# Patient Record
Sex: Male | Born: 1945 | Race: White | Hispanic: No | Marital: Married | Smoking: Former smoker
Health system: Southern US, Community
[De-identification: ages and names within clinical notes are randomized; demographics above are authoritative.]

## PROBLEM LIST (undated history)

## (undated) DIAGNOSIS — G309 Alzheimer's disease, unspecified: Secondary | ICD-10-CM

## (undated) DIAGNOSIS — Z923 Personal history of irradiation: Secondary | ICD-10-CM

## (undated) DIAGNOSIS — R12 Heartburn: Secondary | ICD-10-CM

## (undated) DIAGNOSIS — G4730 Sleep apnea, unspecified: Secondary | ICD-10-CM

## (undated) DIAGNOSIS — C61 Malignant neoplasm of prostate: Secondary | ICD-10-CM

## (undated) DIAGNOSIS — E785 Hyperlipidemia, unspecified: Secondary | ICD-10-CM

## (undated) DIAGNOSIS — M1990 Unspecified osteoarthritis, unspecified site: Secondary | ICD-10-CM

## (undated) DIAGNOSIS — Z9221 Personal history of antineoplastic chemotherapy: Secondary | ICD-10-CM

## (undated) DIAGNOSIS — F329 Major depressive disorder, single episode, unspecified: Secondary | ICD-10-CM

## (undated) DIAGNOSIS — C801 Malignant (primary) neoplasm, unspecified: Secondary | ICD-10-CM

## (undated) DIAGNOSIS — F0280 Dementia in other diseases classified elsewhere without behavioral disturbance: Secondary | ICD-10-CM

## (undated) DIAGNOSIS — F0390 Unspecified dementia without behavioral disturbance: Secondary | ICD-10-CM

## (undated) DIAGNOSIS — F32A Depression, unspecified: Secondary | ICD-10-CM

## (undated) DIAGNOSIS — I10 Essential (primary) hypertension: Secondary | ICD-10-CM

## (undated) DIAGNOSIS — E291 Testicular hypofunction: Secondary | ICD-10-CM

## (undated) DIAGNOSIS — T7840XA Allergy, unspecified, initial encounter: Secondary | ICD-10-CM

## (undated) HISTORY — DX: Essential (primary) hypertension: I10

## (undated) HISTORY — DX: Sleep apnea, unspecified: G47.30

## (undated) HISTORY — PX: INGUINAL HERNIA REPAIR: SUR1180

## (undated) HISTORY — DX: Hyperlipidemia, unspecified: E78.5

## (undated) HISTORY — PX: APPENDECTOMY: SHX54

## (undated) HISTORY — DX: Malignant neoplasm of prostate: C61

## (undated) HISTORY — DX: Allergy, unspecified, initial encounter: T78.40XA

## (undated) HISTORY — DX: Depression, unspecified: F32.A

## (undated) HISTORY — DX: Major depressive disorder, single episode, unspecified: F32.9

## (undated) HISTORY — DX: Malignant (primary) neoplasm, unspecified: C80.1

## (undated) HISTORY — DX: Testicular hypofunction: E29.1

## (undated) HISTORY — DX: Personal history of antineoplastic chemotherapy: Z92.21

## (undated) HISTORY — PX: MENISECTOMY: SHX5181

## (undated) HISTORY — DX: Unspecified osteoarthritis, unspecified site: M19.90

## (undated) HISTORY — PX: PROSTATE SURGERY: SHX751

---

## 2000-03-02 ENCOUNTER — Encounter: Payer: Self-pay | Admitting: Cardiology

## 2000-03-02 ENCOUNTER — Encounter: Admission: RE | Admit: 2000-03-02 | Discharge: 2000-03-02 | Payer: Self-pay | Admitting: Cardiology

## 2000-03-07 ENCOUNTER — Observation Stay (HOSPITAL_COMMUNITY): Admission: RE | Admit: 2000-03-07 | Discharge: 2000-03-07 | Payer: Self-pay | Admitting: Cardiology

## 2002-08-20 ENCOUNTER — Encounter: Payer: Self-pay | Admitting: Family Medicine

## 2002-08-20 ENCOUNTER — Encounter: Admission: RE | Admit: 2002-08-20 | Discharge: 2002-08-20 | Payer: Self-pay | Admitting: Family Medicine

## 2002-08-25 ENCOUNTER — Encounter: Admission: RE | Admit: 2002-08-25 | Discharge: 2002-08-25 | Payer: Self-pay | Admitting: Family Medicine

## 2002-08-25 ENCOUNTER — Encounter: Payer: Self-pay | Admitting: Family Medicine

## 2002-08-29 ENCOUNTER — Encounter: Payer: Self-pay | Admitting: Family Medicine

## 2002-08-29 ENCOUNTER — Encounter: Admission: RE | Admit: 2002-08-29 | Discharge: 2002-08-29 | Payer: Self-pay | Admitting: Family Medicine

## 2003-05-24 ENCOUNTER — Encounter: Payer: Self-pay | Admitting: Family Medicine

## 2003-05-24 ENCOUNTER — Encounter: Admission: RE | Admit: 2003-05-24 | Discharge: 2003-05-24 | Payer: Self-pay | Admitting: Family Medicine

## 2005-08-07 HISTORY — PX: COLONOSCOPY: SHX174

## 2006-03-31 ENCOUNTER — Emergency Department (HOSPITAL_COMMUNITY): Admission: EM | Admit: 2006-03-31 | Discharge: 2006-03-31 | Payer: Self-pay | Admitting: Family Medicine

## 2006-06-27 ENCOUNTER — Ambulatory Visit: Payer: Self-pay | Admitting: Family Medicine

## 2006-06-28 ENCOUNTER — Observation Stay (HOSPITAL_COMMUNITY): Admission: EM | Admit: 2006-06-28 | Discharge: 2006-06-29 | Payer: Self-pay | Admitting: Emergency Medicine

## 2006-07-12 ENCOUNTER — Ambulatory Visit: Payer: Self-pay | Admitting: Family Medicine

## 2006-08-03 ENCOUNTER — Ambulatory Visit: Payer: Self-pay | Admitting: Internal Medicine

## 2006-08-17 ENCOUNTER — Ambulatory Visit: Payer: Self-pay | Admitting: Internal Medicine

## 2007-01-28 ENCOUNTER — Ambulatory Visit: Payer: Self-pay | Admitting: Family Medicine

## 2007-02-05 ENCOUNTER — Ambulatory Visit (HOSPITAL_COMMUNITY): Admission: RE | Admit: 2007-02-05 | Discharge: 2007-02-06 | Payer: Self-pay | Admitting: Orthopedic Surgery

## 2007-10-23 ENCOUNTER — Ambulatory Visit: Payer: Self-pay | Admitting: Family Medicine

## 2008-01-16 ENCOUNTER — Ambulatory Visit: Payer: Self-pay | Admitting: Family Medicine

## 2008-01-27 ENCOUNTER — Ambulatory Visit: Payer: Self-pay | Admitting: Family Medicine

## 2008-11-02 ENCOUNTER — Ambulatory Visit: Payer: Self-pay | Admitting: Family Medicine

## 2008-11-04 ENCOUNTER — Encounter: Admission: RE | Admit: 2008-11-04 | Discharge: 2008-11-04 | Payer: Self-pay | Admitting: Family Medicine

## 2008-11-13 ENCOUNTER — Ambulatory Visit: Payer: Self-pay | Admitting: Family Medicine

## 2008-12-08 ENCOUNTER — Ambulatory Visit: Payer: Self-pay | Admitting: Family Medicine

## 2009-07-13 ENCOUNTER — Ambulatory Visit: Payer: Self-pay | Admitting: Family Medicine

## 2009-09-13 ENCOUNTER — Ambulatory Visit: Payer: Self-pay | Admitting: Family Medicine

## 2009-12-09 ENCOUNTER — Ambulatory Visit: Payer: Self-pay | Admitting: Family Medicine

## 2009-12-21 ENCOUNTER — Ambulatory Visit: Payer: Self-pay | Admitting: Family Medicine

## 2010-03-23 ENCOUNTER — Ambulatory Visit: Payer: Self-pay | Admitting: Family Medicine

## 2010-06-02 ENCOUNTER — Ambulatory Visit: Payer: Self-pay | Admitting: Family Medicine

## 2010-06-09 ENCOUNTER — Ambulatory Visit: Payer: Self-pay | Admitting: Family Medicine

## 2010-11-26 ENCOUNTER — Encounter: Payer: Self-pay | Admitting: Family Medicine

## 2010-12-20 NOTE — H&P (Signed)
NAMEDEXTER, SIGNOR NO.:  [id number]   MEDICAL RECORD NO.:  [id number]          PATIENT TYPE:  INP   LOCATION:  NA                           FACILITY:  [facility]   PHYSICIAN:  Madlyn Frankel. Charlann Boxer, M.D.  DATE OF BIRTH:  Dec 08, 1945   DATE OF ADMISSION:  02/05/2007  DATE OF DISCHARGE:                              HISTORY & PHYSICAL   PROCEDURE:  Left unicondylar knee replacement.   CHIEF COMPLAINT:  Left knee pain.   HISTORY OF PRESENT ILLNESS:  This is a 65 year old male with a history  of persistent progressive knee pain predominantly over the medial  portion of his knee.  He has been refractory to all conservative  treatments.  He has been presurgically assessed by Dr. Sharlot Gowda who  has recommended he go for a full cardiac workup preoperatively.   PAST MEDICAL HISTORY:  Significant for:  1. Osteoarthritis.  2. Depression.  3. 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  4. Hypertension.  5. Dyslipidemia.  6. Testicular cancer.   PAST SURGICAL HISTORY:  1. Appendectomy.  2. Inguinal orchiectomy.  3. Retroperitoneal lymph node dissection.  4. Arthroscopic knee surgery in 2004 and 2005.   FAMILY HISTORY:  Heart disease, cancer.   SOCIAL HISTORY:  Married.  Wife, Jamesetta So, who will be the primary  caregiver after surgery.   DRUG ALLERGIES:  No known drug allergies.   MEDICATIONS:  1. Amlodipine 5 mg one p.o. daily.  2. Vytorin 10/40 mg one p.o. daily.  3. Niaspan 1000 mg one p.o. daily.  4. Zoloft 100 mg one p.o. daily.  5. Wellbutrin 300 mg one p.o. daily.  6. Multivitamin one p.o. daily.  7. Baby aspirin one p.o. daily.  8. Cosamin DS two p.o. daily.   REVIEW OF SYSTEMS:  None, see HPI.   PHYSICAL EXAMINATION:  VITAL SIGNS:  Pulse 76, respirations 18, blood  pressure 128/86.  GENERAL:  He is awake, alert and oriented, well-developed, well-  nourished, in no acute distress.  He is very anxious about the surgery  and has many  questions.  NECK:  Supple, no carotid bruits.  CHEST:  Lungs clear to auscultation bilaterally.  BREASTS:  Deferred.  HEART:  Regular rate and rhythm without gallops, clicks, rubs or  murmurs.  ABDOMEN:  Soft, nontender, nondistended.  Bowel sounds present all four  quadrants.  GENITOURINARY:  Deferred.  EXTREMITIES:  Dorsalis pedis pulse positive left lower extremity.  He  has medial-sided tenderness of his left knee without effusion.  SKIN:  No skin breakdown.  No scars no rash.  NEUROLOGIC:  Intact distal sensibilities.   Labs, EKG, chest x-ray all pending.   IMPRESSION:  1. Left knee osteoarthritis.  2. Hypertension.  3. Dyslipidemia.  4. Coronary arterial spasm.  5. Testicular cancer.   PLAN OF ACTION:  Left unicondylar knee replacement or partial knee  replacement at [facility] February 05, 2007, by surgeon Dr. Durene Romans.  Risks, complications were discussed.  Questions were encouraged,  answered and reviewed.  Postoperative medications were given.  Also,  preoperative Celebrex prescription given,  indicating one by mouth twice  a day 2 days before surgery, as well as two by mouth with just sip of  water on day of surgery.  He will also continue the use of this  postoperatively with one twice per day for 2 days afterwards.     ______________________________  Yetta Glassman [address], [place]      Madlyn Frankel. Charlann Boxer, M.D.  Electronically Signed    BLM/MEDQ  D:  01/25/2007  T:  01/25/2007  Job:  [id number]

## 2010-12-20 NOTE — Op Note (Signed)
NAMEJAHMAL, [name] NO.:  [id number]   MEDICAL RECORD NO.:  [id number]          PATIENT TYPE:  INP   LOCATION:  X004                         FACILITY:  [facility]   PHYSICIAN:  Madlyn Frankel. Charlann Boxer, M.D.  DATE OF BIRTH:  12-18-1945   DATE OF PROCEDURE:  02/05/2007  DATE OF DISCHARGE:                               OPERATIVE REPORT   PREOPERATIVE DIAGNOSIS:  Left knee osteoarthritis, primarily medial  compartment.   POSTOPERATIVE DIAGNOSIS:  Left knee osteoarthritis, primarily medial  compartment.   PROCEDURE:  Left partial knee replacement, Biomed Oxford Knee with a  size small femur, a left A size tibia, and a size 4 insert left medial.   SURGEON:  Dr. Charlann Boxer   ASSISTANT:  Dwyane Luo, PA-C   ANESTHESIA:  General plus a regional femoral block.   DRAINS:  None.   COMPLICATIONS:  None.   TOURNIQUET TIME:  60 minutes at 250 mmHg.   INDICATION FOR PROCEDURE:  Mr. [name] is a 65 year old gentleman who  presented for evaluation of bilateral knee osteoarthritis primarily in  the medial compartment.  He has failed conservative management including  arthroscopic surgery, injection.  He presented to the office for  consideration of partial versus total knee replacement radiographically.  And symptom-wise, his findings were primarily medial.  We discussed  treatment options.  Risks, benefits reviewed, and he wished to proceed  with partial knee replacement.  Consent was obtained.   PROCEDURE IN DETAIL:  The patient was brought to the operative theatre.  Once adequate anesthesia and preoperative antibiotics, 2 g of Ancef,  were administered, the patient was positioned supine.  A proximal thigh  tourniquet was placed.  The leg was then positioned into the Oxford knee  holder.  I then rescrubbed and prepped the left lower extremity in  standard fashion.  The leg was exsanguinated, tourniquet elevated 250  mmHg.  Paramidline incision was made from patella to the tubercle.  Sharp  dissection was carried out to the extensor mechanism.  Soft  tissues were freed up to allow for mobility in the tissue.  Arthrotomy  was made just at the medial side of the patella.  Debridement of  synovium was carried out.  Following knee exposure, the extramedullary  guide was placed in the proximal tibia, and a proximal tibial cut was  made medially.   I checked this initially with the tibia then resected a little bit more  of the bone off the medial aspect of the medial tibial spine.  At this  point, I then checked for the feeler gauges and felt that my initial cut  looked too tight even for a size 3 feeler gauge.  For that reason, I  dropped down the cutting block to the next hole and resected the bone.  Following this, the size 3 feeler gauge felt great, and the size 4  feeler gauge just a little tight.  At this point, I removed the  extramedullary guide.  Attention was directed to the femur.  Starting  awl was placed in the center of the femoral canal just 1 cm  above the  medial aspect of the notch.   Then with the size 3 feeler gauge in place, the femoral cutting jig was  placed.  It centered into the center portion of the medial femoral  condyle.  It was at this point that it was determined that I would use a  size small femoral component based on the size of the femur.  The medial  and lateral dimension of the femur was pretty small.  We opened up a  small set and used the appropriate instruments at this point.  I drilled  this into position and made the posterior femoral cut.  All parameters  were checked based on the intramedullary rod.  Once this cut was made, I  removed bone, and the respective posterior bone fit nicely with the size  small component.  Zero spicket was in place and the distal femur milled.  I removed excessive bone and did a trial reduction.  At this point,  based on the trial reduction, I ended up using the size 4 spicket  distally.  This was because the  4 feeler gauge felt good in flexion;  however, in extension, I could not even get the 1 end barely in 20  degrees of flexion.  I then replaced with the 4 spicket mid, removed  bone including using osteotome.  With the posterior aspect of the knee  opened now, I was able to debride the remaining meniscus off the medial  proximal tibia, freed up in a posterior bone.  I then retrialed with a  small femur, nail changed to an 8 tibial tray based on the fact that  there was overhang with the size B.  With the feeler gauges in place,  the size 3 felt maybe a little loose.  The size 4 at this point felt  good but a little tight.  Nonetheless, the knee was balanced from  extension to flexion.  At this point, I finished off the tibial  preparation.  I again, using the size left A tibia, pinned into position  and used the reciprocating saw to create the trough for the keel.  Following this preparation, the keel reduction was carried out with the  standard trial.  At this point, I determined the size 4 insert felt best  in both extension and flexion.  At this point, all trial component were  removed.  I used a drill to drill through some sclerotic bone and  irrigated the knee out.  I then injected 45 mL of 0.25% Marcaine with  epinephrine and 1 mL of Toradol.  Following this, the final components  were opened and cemented in 2 batches, first the tibial tray then the  femur.  Excessive cement  debrided each of these levels and was done in  2-stage technique.  Once this was carried out and we irrigated again, I  injected Floseal on the posterior aspect of the knee and then placed the  final 4 insert medial.  It was small to match the small femoral  component.  The wound was irrigated.  Extensor mechanism reapproximated  using a #1 Vicryl.  No Hemovac used and FloSeal was utilized.  The  remaining was closed in layers with 2-0 Vicryl and 4-0 running Monocryl.  The knee was then dressed into a sterile   bulky Jones dressing .  He was  brought to the recovery room stable.      Madlyn Frankel Charlann Boxer, M.D.  Electronically Signed  MDO/MEDQ  D:  02/05/2007  T:  02/05/2007  Job:  [id number]

## 2010-12-23 NOTE — Cardiovascular Report (Signed)
[place]  Patient:    [name], [name]                         MRN: [id number] Proc. Date: 03/07/00 Attending:  Thereasa Solo. Little, M.D. CC:         Ronnald Nian, M.D.             Thereasa Solo. Little, M.D.             Cardiac Catheterization Laboratory                        Cardiac Catheterization  INDICATIONS FOR PROCEDURE:  This is a 65 year old male who has had atypical chest pain and nuclear study showed inferior ischemia and the patient is brought in for cardiac catheterization.  PROCEDURE PERFORMED: 1. Left heart catheterization. 2. Selective left and right coronary arteriography. 3. Ventriculography in the right anterior oblique projection. 4. A 6 French venous sheath for intravenous access in the right femoral    vein.  COMPLICATIONS:  None.  DESCRIPTION OF PROCEDURE:  The patient was prepped and draped in the usual sterile fashion exposing the right groin.  Following local anesthetic with 1% Xylocaine, the Seldinger technique was employed and a 6 [place] introducer sheath was placed in the right femoral artery.  Selective right and left coronary arteriography and ventriculography in the RAO projection was performed.  The 6 French Judkins configuration catheters were used.  RESULTS: 1. Hemodynamic monitoring:  Central aortic pressure 114/83, left    ventricular pressure 111/7.  There was reverse gradient noted at the    time of pullback. 2. Ventriculography:  Ventriculography in the RAO projection revealed    normal left ventricular systolic function.  There was mitral valve prolapse    without mitral regurgitation.  End-systolic pressure was 7.  Ejection    fraction greater than 60%.  When the pigtail was passed into the    left ventricle, the patient developed a transient left bundle-branch block    that was self-limiting and resolved without treatment and the patient was    asymptomatic with this and there was no drop in his blood  pressure.  CORONARY ARTERIOGRAPHY:  There was no calcification noted on fluoroscopy. 1. Left main:  Normal. 2. LAD.  The LAD extended down to the apex of the heart and gave rise to    two diagonals, all of which were normal. 3. Circumflex:  The circumflex gave rise to the first OM which was a large    vessel and the second OM which was a small vessel.  This entire system was    free of disease. 4. Right coronary artery:  The right coronary artery is a large dominant    vessel supplying both the PDA and posterolateral vessels.  On the first    injection of the right coronary artery there were mild irregularities    noted, but as the catheter was removed, there was a 90% narrowing and a    shepherds crook.  This 90% area remained for the next several injections.    Even a cusp injection showed the same abnormality.  The patient was    given intracoronary nitroglycerin without any change in this obstruction.  Because of the above-mentioned lesion in the RCA, arrangements were made for stent placement.  A IV access was unobtainable in his left forearm and I placed a 6 French venous sheath into  his right femoral vein.  He was given 6000 units of intravenous heparin and a 6 [place] guide catheter with side holes was placed into the ascending aorta and with the first injection into the RCA, the vessel was completely normal.  The area that had previously been narrowed was completely normal and I suspect this was all ______ coronary spasm, probably catheter-induced.  About 10 minutes later a repeat visualization of the same area showed no recurrence of the stenosis and at this point the procedure was terminated. DD:  03/07/00 TD:  03/08/00 Job: [id number]

## 2010-12-23 NOTE — Discharge Summary (Signed)
NAMELINDWOOD, MOGEL NO.:  [id number]   MEDICAL RECORD NO.:  [id number]          PATIENT TYPE:  INP   LOCATION:  5020                         FACILITY:  [facility]   PHYSICIAN:  Madelynn Done, MD  DATE OF BIRTH:  Feb 06, 1946   DATE OF ADMISSION:  06/28/2006  DATE OF DISCHARGE:  06/29/2006                               DISCHARGE SUMMARY   DISCHARGE DIAGNOSES:  1. Right hand cellulitis.  2. Right hand injury from cat scratch.   DISCHARGE MEDICATIONS:  1. Augmentin 875 mg p.o. b.i.d.  2. Vicodin 1-2 tablets p.o. q.4-6h. as needed for pain.   PROCEDURES AND DATES:  None.   REASON FOR ADMISSION:  Mr. Langenderfer is a 65 year old right-hand dominant  gentleman who presented to the emergency department on June 28, 2006, with pain, redness and swelling in his right hand after sustaining  a cat scratch on June 27, 2006.  Patient was seen by his primary  care physician who instructed him if his hand did not get better after  one day to arrive to the ER for evaluation.  Patient was having a  subjective temperature as well as a measured temperature of 101.  Patient presented to the emergency department with pain and swelling in  that right hand.  On evaluation in the emergency department, the patient  was noted to have a cellulitis over the ulnar border of his right hand.  There was no evidence of a deep abscess or tenosynovitis.  Patient was  admitted for IV antibiotics, elevation and splinting.   HOSPITAL COURSE:  Patient was admitted to the orthopedic floor for IV  Unasyn as well as splinting, elevation and rest.  Patient was continued  on IV Unasyn 3 g IV q.6h.  He remained afebrile throughout his hospital  course.  Patient was seen and examined on hospital day #1.  His hand  looked much better.  There was less swelling, less redness in the hand.  Again, there was no evidence of septic arthritis, tenosynovitis or deep  abscess in the hand.  Patient was given the  IV antibiotics and felt  ready to be discharged after his evening dose on hospital day #1.  The  patient received just over 24 hours of IV antibiotics.  On the day of  his discharge, he was afebrile.  His vital signs were stable and normal.  His pain was controlled and improved with the inpatient care.   RECOMMENDATIONS AND DISPOSITION:  Patient is to be discharged to home.  He is to continue with the splint immobilization, the p.o. Augmentin.  Plan to contact the patient throughout the course of the weekend to see  how he is doing.  He was instructed he needs to return to the ER sooner  if he has worsening pain or swelling in that right hand or any fevers.  His questions were answered.  We will see him back in the office on  Monday but be in  touch with him Saturday and Sunday to make sure the infection does not  worsen.  It was explained to  him if it does get worse, he may need  surgical intervention but at the current time, he is improving and there  was no need for further intervention at the current time.      Madelynn Done, MD  Electronically Signed     FWO/MEDQ  D:  07/02/2006  T:  07/02/2006  Job:  [id number]

## 2010-12-28 ENCOUNTER — Ambulatory Visit (INDEPENDENT_AMBULATORY_CARE_PROVIDER_SITE_OTHER): Payer: 59 | Admitting: Family Medicine

## 2010-12-28 ENCOUNTER — Encounter: Payer: Self-pay | Admitting: Family Medicine

## 2010-12-28 VITALS — BP 116/70 | HR 76 | Ht 70.8 in | Wt 165.0 lb

## 2010-12-28 DIAGNOSIS — R339 Retention of urine, unspecified: Secondary | ICD-10-CM

## 2010-12-28 DIAGNOSIS — N401 Enlarged prostate with lower urinary tract symptoms: Secondary | ICD-10-CM | POA: Insufficient documentation

## 2010-12-28 DIAGNOSIS — E291 Testicular hypofunction: Secondary | ICD-10-CM | POA: Insufficient documentation

## 2010-12-28 DIAGNOSIS — E785 Hyperlipidemia, unspecified: Secondary | ICD-10-CM | POA: Insufficient documentation

## 2010-12-28 DIAGNOSIS — G4730 Sleep apnea, unspecified: Secondary | ICD-10-CM

## 2010-12-28 DIAGNOSIS — R972 Elevated prostate specific antigen [PSA]: Secondary | ICD-10-CM

## 2010-12-28 DIAGNOSIS — Z79899 Other long term (current) drug therapy: Secondary | ICD-10-CM | POA: Insufficient documentation

## 2010-12-28 LAB — CBC WITH DIFFERENTIAL/PLATELET
Basophils Absolute: 0.1 10*3/uL (ref 0.0–0.1)
Lymphocytes Relative: 30 % (ref 12–46)
Neutro Abs: 2.3 10*3/uL (ref 1.7–7.7)
Neutrophils Relative %: 55 % (ref 43–77)
Platelets: 272 10*3/uL (ref 150–400)
RDW: 12.7 % (ref 11.5–15.5)
WBC: 4.2 10*3/uL (ref 4.0–10.5)

## 2010-12-28 LAB — LIPID PANEL
HDL: 58 mg/dL (ref >39)
Triglycerides: 86 mg/dL (ref <150)

## 2010-12-28 LAB — COMPREHENSIVE METABOLIC PANEL
CO2: 25 mEq/L (ref 19–32)
Calcium: 9.7 mg/dL (ref 8.4–10.5)
Creat: 0.92 mg/dL (ref 0.40–1.50)
Glucose, Bld: 106 mg/dL — ABNORMAL HIGH (ref 70–99)
Total Bilirubin: 0.4 mg/dL (ref 0.3–1.2)
Total Protein: 6.5 g/dL (ref 6.0–8.3)

## 2010-12-28 LAB — PSA, TOTAL AND FREE
PSA, Free: 1.1 ng/mL
PSA: 10.47 ng/mL — ABNORMAL HIGH (ref <=4.00)

## 2010-12-28 MED ORDER — TERAZOSIN HCL 2 MG PO CAPS: 2.0000 mg | ORAL_CAPSULE | Freq: Every day | ORAL | Status: DC

## 2010-12-28 NOTE — Progress Notes (Signed)
Subjective:    Patient ID: [name], male    DOB: [birth year], 65 y.o.   MRN: [id number]  HPI he is here for a complete examination. He is now having some urinary symptoms of nocturiax4, hesitancy, decreased stream and incomplete emptying. He states that his allergies are under good control. He still does have erectile dysfunction and is interested in a less expensive medicine than Cialis. He does have sleep apnea but is not using the CPAP stating he had difficulty with it and it interfered with sleeping. He is not interested in pursuing this further. He continues to be followed by his cardiologist. That note was reviewed. He continues on medications listed in the chart. He is a former smoker. He drinks approximately one beer per day. His marriage is going well. Family history is unchanged. He continues on his psychotropic medications and is doing well on them. He stopped using his testosterone because of cost.    Review of Systems  Constitutional: Negative.   HENT: Negative.   Eyes: Negative.   Respiratory: Negative.   Gastrointestinal: Negative.   Musculoskeletal: Negative.   Skin: Negative.   Neurological: Negative.   Hematological: Negative.        Objective:   Physical ExamBP 116/70  Pulse 76  Ht 5' 10.8" (1.798 m)  Wt 165 lb (74.844 kg)  BMI 23.14 kg/m2  General Appearance:    Alert, cooperative, no distress, appears stated age  Head:    Normocephalic, without obvious abnormality, atraumatic  Eyes:    PERRL, conjunctiva/corneas clear, EOM's intact, fundi    benign  Ears:    Normal TM's and external ear canals with cerumen right canal   Nose:   Nares normal, mucosa normal, no drainage or sinus   tenderness  Throat:   Lips, mucosa, and tongue normal; teeth and gums normal  Neck:   Supple, no lymphadenopathy;  thyroid:  no   enlargement/tenderness/nodules; no carotid   bruit or JVD  Back:    Spine nontender, no curvature, ROM normal, no CVA     tenderness  Lungs:      Clear to auscultation bilaterally without wheezes, rales or     ronchi; respirations unlabored  Chest Wall:    No tenderness or deformity   Heart:    Regular rate and rhythm, S1 and S2 normal, no murmur, rub   or gallop  Breast Exam:    No chest wall tenderness, masses or gynecomastia  Abdomen:     Soft, non-tender, nondistended, normoactive bowel sounds,    no masses, no hepatosplenomegaly  Genitalia:    Normal male external genitalia without lesions.  Testicles without masses.  No inguinal hernias.  Rectal:    Normal sphincter tone, no masses or tenderness; guaiac negative stool.  Prostate smooth, no nodules,  enlarged.  Extremities:   No clubbing, cyanosis or edema  Pulses:   2+ and symmetric all extremities  Skin:   Skin color, texture, turgor normal, no rashes or lesions  Lymph nodes:   Cervical, supraclavicular, and axillary nodes normal  Neurologic:   CNII-XII intact, normal strength, sensation and gait; reflexes 2+ and symmetric throughout          Psych:   Normal mood, affect, hygiene and grooming.            Assessment & Plan:  See problem list. He is to stop his Norvasc and start Hytrin. Routine blood screening. I will also wait on his blood studies before starting him on testosterone.

## 2010-12-28 NOTE — Patient Instructions (Addendum)
Stop the Norvasc and start on Hytrin. Start first Hytrin at night. Let me know how it works to help with her urinary symptoms stay on all your other medications We will call him the testosterone to custom care. Return here in one month for recheck.

## 2010-12-29 LAB — TESTOSTERONE: Testosterone: 348.59 ng/dL (ref 250–890)

## 2011-02-06 ENCOUNTER — Encounter: Payer: Self-pay | Admitting: Family Medicine

## 2011-02-06 ENCOUNTER — Ambulatory Visit: Payer: 59 | Admitting: Family Medicine

## 2011-05-23 LAB — CBC
HCT: 39.3
Platelets: 171
WBC: 4.9

## 2011-05-23 LAB — BASIC METABOLIC PANEL
BUN: 12
GFR calc non Af Amer: >60
Potassium: 4

## 2011-05-23 LAB — TYPE AND SCREEN

## 2011-05-23 LAB — ABO/RH: ABO/RH(D): A NEG

## 2011-05-24 LAB — URINALYSIS, ROUTINE W REFLEX MICROSCOPIC
Bilirubin Urine: NEGATIVE
Nitrite: NEGATIVE
Specific Gravity, Urine: 1.015
pH: 6

## 2011-05-24 LAB — COMPREHENSIVE METABOLIC PANEL
ALT: 34
Alkaline Phosphatase: 55
CO2: 25
Chloride: 103
GFR calc non Af Amer: >60
Glucose, Bld: 95
Potassium: 4.5
Sodium: 136

## 2011-05-24 LAB — CBC
Hemoglobin: 14.9
RBC: 4.67
WBC: 3.8 — ABNORMAL LOW

## 2011-05-24 LAB — PROTIME-INR: Prothrombin Time: 13.7

## 2011-06-07 DIAGNOSIS — C61 Malignant neoplasm of prostate: Secondary | ICD-10-CM

## 2011-06-07 DIAGNOSIS — Z8546 Personal history of malignant neoplasm of prostate: Secondary | ICD-10-CM | POA: Insufficient documentation

## 2011-06-07 HISTORY — DX: Malignant neoplasm of prostate: C61

## 2011-08-02 ENCOUNTER — Encounter: Payer: Self-pay | Admitting: *Deleted

## 2011-08-02 DIAGNOSIS — M1990 Unspecified osteoarthritis, unspecified site: Secondary | ICD-10-CM | POA: Insufficient documentation

## 2011-08-02 NOTE — Progress Notes (Signed)
Married, retired  Family hx lung cancer  06/07/11 prostate bx reviewed at [facility] 06/14/11 : gleason 3+3=6  01/17/11 Prostate bx:sm focus atypical glands  05/31/11 PSA  5.93 01/04/11 PSA 7.67  1992 L ing orchiectomy for testicular cancer, chemo- Dr Cyndie Chime

## 2011-08-03 ENCOUNTER — Encounter: Payer: Self-pay | Admitting: Radiation Oncology

## 2011-08-03 ENCOUNTER — Ambulatory Visit
Admission: RE | Admit: 2011-08-03 | Discharge: 2011-08-03 | Disposition: A | Discharge status: Home / Self Care | Payer: 59 | Source: Ambulatory Visit | Attending: Radiation Oncology | Admitting: Radiation Oncology

## 2011-08-03 VITALS — BP 117/82 | HR 94 | Temp 98.1°F | Resp 20 | Wt 177.9 lb

## 2011-08-03 DIAGNOSIS — Z79899 Other long term (current) drug therapy: Secondary | ICD-10-CM | POA: Insufficient documentation

## 2011-08-03 DIAGNOSIS — I10 Essential (primary) hypertension: Secondary | ICD-10-CM | POA: Insufficient documentation

## 2011-08-03 DIAGNOSIS — Z801 Family history of malignant neoplasm of trachea, bronchus and lung: Secondary | ICD-10-CM | POA: Insufficient documentation

## 2011-08-03 DIAGNOSIS — Z9221 Personal history of antineoplastic chemotherapy: Secondary | ICD-10-CM | POA: Insufficient documentation

## 2011-08-03 DIAGNOSIS — C61 Malignant neoplasm of prostate: Secondary | ICD-10-CM

## 2011-08-03 DIAGNOSIS — Z7982 Long term (current) use of aspirin: Secondary | ICD-10-CM | POA: Insufficient documentation

## 2011-08-03 DIAGNOSIS — Z9079 Acquired absence of other genital organ(s): Secondary | ICD-10-CM | POA: Insufficient documentation

## 2011-08-03 DIAGNOSIS — G4730 Sleep apnea, unspecified: Secondary | ICD-10-CM | POA: Insufficient documentation

## 2011-08-03 DIAGNOSIS — R12 Heartburn: Secondary | ICD-10-CM | POA: Insufficient documentation

## 2011-08-03 DIAGNOSIS — E785 Hyperlipidemia, unspecified: Secondary | ICD-10-CM | POA: Insufficient documentation

## 2011-08-03 HISTORY — DX: Heartburn: R12

## 2011-08-03 NOTE — Progress Notes (Signed)
Please see the Nurse Progress Note in the MD Initial Consult Encounter for this patient. 

## 2011-08-03 NOTE — Progress Notes (Signed)
I-PSS RESULTS 08/03/11=[id number] IIIEF RESULTS=[id number] MARRIED, NO CHILDREN,   FATHER LUNG CANCER,MOTHER LUNG CANCER, RETIRED  NKDA

## 2011-08-03 NOTE — Progress Notes (Signed)
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 male who was treated for left testicular nongerm cell tumor in 1992. He underwent left orchiectomy with Dr. Logan Bores followed by RPLND and adjuvant chemotherapy under the care of Dr. Cyndie Chime.  During routine annual surveillance, the patient's PSA was noted to rise from baseline of 3 or 4 up to 10.47 on May 24th of 2012. This prompted a referral to Dr. Heloise Purpura.  On May 30, performed a digital rectal exam. This revealed a 50 g prostate with no nodules. The patient proceeded to transrectal ultrasound with prostate biopsy on June 12 of 2012. This initial biopsy showed atypia in the left apex and high-grade PIN the right gland. Because of the patient's free PSA level of 11%, the suspicion for malignancy and he underwent a transrectal ultrasound repeat biopsy on October 31. At This point, a total of 26 core biopsies were obtained. Prostate volume was measured to be 59.5 cc.. 226 core biopsies showed adenocarcinoma with Gleason score of 3+3 in 5% of material these were located in the right mid gland and right lateral base. The patient discussed a variety of potential treatment options with Dr. Laverle Patter in his, been referred today for discussion of potential radiation treatment.  PREVIOUS RADIATION THERAPY: No  PAST MEDICAL HISTORY:  has a past medical history of Hypogonadism male; Dyslipidemia; Sleep apnea; Depression; Prostate cancer (06/07/11); Status post chemotherapy; Cancer; Allergy; Hypertension; Arthritis; Cataract; and Heartburn.    PAST SURGICAL HISTORY: Past  Surgical History  Procedure Date  . Prostate surgery     biopsy x 2  . Appendectomy   . Colonoscopy 2007    gessner  . Menisectomy     R&L knee surgeries  . Inguinal hernia repair     w/orchiectomy 1992, retroperitoneal lymph node dissection    FAMILY HISTORY: family history includes Lung cancer in his father and mother.  SOCIAL HISTORY:  reports that he quit smoking about 12 years ago. His smoking use included Cigarettes. He has a 20 pack-year smoking history. He has never used smokeless tobacco. He reports that he drinks about 3.6 ounces of alcohol per week. He reports that he does not use illicit drugs.  ALLERGIES: Review of patient's allergies indicates no known allergies.  MEDICATIONS:  Current Outpatient Prescriptions  Medication Sig Dispense Refill  . aspirin 325 MG tablet Take 325 mg by mouth daily.        Marland Kitchen buPROPion (WELLBUTRIN XL) 300 MG 24 hr tablet Take 300 mg by mouth daily.        . cholecalciferol (VITAMIN D) 1000 UNITS tablet Take 1,000 Units by mouth daily. TAKES 2000 UNITS DAILY       . Multiple Vitamins-Minerals (MULTIVITAMIN WITH MINERALS) tablet Take 1 tablet by mouth daily.        . niacin (NIASPAN) 1000 MG CR tablet Take 1,000 mg by mouth at bedtime.        . ranitidine (ZANTAC) 150 MG capsule Take 150 mg by mouth daily as needed.        . rosuvastatin (CRESTOR) 20 MG tablet Take 20 mg by mouth daily.        Marland Kitchen  sertraline (ZOLOFT) 100 MG tablet Take 100 mg by mouth daily.        Marland Kitchen terazosin (HYTRIN) 2 MG capsule Take 1 capsule (2 mg total) by mouth daily.  30 capsule  11    REVIEW OF SYSTEMS:  A 1500 this is Dr. and record. This is essentially noncontributory. However, he did go on IPSS as questionnaire today reporting overall score of 25 suggesting severe or outflow obstructive symptoms.  He also thalidomide and the question. He is not sexually active at this time.   PHYSICAL EXAM:  weight is 177 lb 14.4 oz (80.695 kg). His oral temperature is 98.1 F (36.7  C). His blood pressure is 117/82 and his pulse is 94. His respiration is 20 and oxygen saturation is 98%.  further detailed exam is deferred today. Please note a digital rectal exam findings described above with prostate was free of nodules.  LABORATORY DATA:  Lab Results  Component Value Date   WBC 4.2 12/28/2010   HGB 14.3 12/28/2010   HCT 43.2 12/28/2010   MCV 96.0 12/28/2010   PLT 272 12/28/2010   Lab Results  Component Value Date   NA 141 12/28/2010   K 5.1 12/28/2010   CL 108 12/28/2010   CO2 25 12/28/2010   Lab Results  Component Value Date   ALT 23 12/28/2010   AST 27 12/28/2010   ALKPHOS 61 12/28/2010   BILITOT 0.4 12/28/2010     RADIOGRAPHY:    IMPRESSION: Mr. [name] is a very nice 65 year old gentleman with a history of abdominal and retroperitoneal lymph node dissection and newly diagnosed favorable risk prostate cancer. He is notable for bright potential treatment options including active surveillance, radical prostatectomy, external beam radiotherapy, and prostate brachytherapy.  PLAN: Today, Mr. Mendez the findings and workup as appropriate talked about the various options described above. Which haven't the implications of previous records we'll surgery on the possibility for laparoscopic surgery now versus conversion to open surgery. We also talked about external beam radiation treatment as well as prostate brachytherapy we talked about the effects of radiation on urinary symptoms given his high urinary symptom score and large prostate gland. At this time, I would not suggest prostate seed implant alone given the risk for urinary retention. However, prostate seed implantation could be considered following a course of Proscar or other effort to downsize the prostate gland. At this point, the patient would like to proceed with treatment and is interested in radiation therapy, suspect he would be well-suited for external beam radiotherapy using RT. Talked about the today. Abdomen image  guided radiation as well as intensity modulated radiotherapy. We talked about the logistics and delivery of treatment as well as the anticipated acute and late sequelae. The patient would like to continue considering his treatment options. He is interested in active surveillance initially and may ultimately be interested in pursuing radiation treatment and later time. We did talk about trial Proscar to treat his benign prostatic hypertrophy during his course of active surveillance. The effects of Proscar on PSA suppression and briefly discussed the effect of Proscar on Gleason's grade assessments. The patient plans to followup with Dr. Laverle Patter. I encouraged him to contact our office or, if he has additional questions or enjoyed meeting Mr. Foots today and will look forward to following his progress.  I spent 60 minutes minutes face to face with the patient and more than 50% of that time was spent in counseling and/or coordination of care.   ------------------------------------------------  Molli Hazard  Kristian Covey, M.D.        I

## 2011-09-01 ENCOUNTER — Encounter: Payer: Self-pay | Admitting: Internal Medicine

## 2011-10-23 ENCOUNTER — Telehealth: Payer: Self-pay | Admitting: Internal Medicine

## 2011-10-23 MED ORDER — BUPROPION HCL ER (XL) 300 MG PO TB24: 300.0000 mg | ORAL_TABLET | Freq: Every day | ORAL | Status: DC

## 2011-10-23 MED ORDER — SERTRALINE HCL 100 MG PO TABS: 100.0000 mg | ORAL_TABLET | Freq: Every day | ORAL | Status: DC

## 2011-10-23 MED ORDER — ROSUVASTATIN CALCIUM 20 MG PO TABS: 20.0000 mg | ORAL_TABLET | Freq: Every day | ORAL | Status: DC

## 2011-10-23 MED ORDER — NIACIN ER (ANTIHYPERLIPIDEMIC) 1000 MG PO TBCR: 1000.0000 mg | EXTENDED_RELEASE_TABLET | Freq: Every day | ORAL | Status: DC

## 2011-10-23 NOTE — Telephone Encounter (Signed)
The meds were called in 

## 2011-11-09 ENCOUNTER — Other Ambulatory Visit (HOSPITAL_COMMUNITY): Payer: Self-pay | Admitting: Urology

## 2011-11-09 DIAGNOSIS — C61 Malignant neoplasm of prostate: Secondary | ICD-10-CM

## 2011-11-15 ENCOUNTER — Other Ambulatory Visit: Payer: Self-pay | Admitting: Family Medicine

## 2011-12-11 ENCOUNTER — Ambulatory Visit (HOSPITAL_COMMUNITY)
Admission: RE | Admit: 2011-12-11 | Discharge: 2011-12-11 | Disposition: A | Discharge status: Home / Self Care | Payer: 59 | Source: Ambulatory Visit | Attending: Urology | Admitting: Urology

## 2011-12-11 DIAGNOSIS — C61 Malignant neoplasm of prostate: Secondary | ICD-10-CM | POA: Insufficient documentation

## 2011-12-11 MED ORDER — GADOBENATE DIMEGLUMINE 529 MG/ML IV SOLN
20.0000 mL | Freq: Once | INTRAVENOUS | Status: AC | PRN
  Administered 2011-12-11: 16 mL via INTRAVENOUS

## 2011-12-27 ENCOUNTER — Other Ambulatory Visit: Payer: Self-pay | Admitting: Family Medicine

## 2011-12-27 MED ORDER — TERAZOSIN HCL 2 MG PO CAPS: 2.0000 mg | ORAL_CAPSULE | Freq: Every day | ORAL | Status: DC

## 2012-04-18 ENCOUNTER — Encounter: Payer: Self-pay | Admitting: Internal Medicine

## 2012-05-09 ENCOUNTER — Encounter: Payer: Self-pay | Admitting: Internal Medicine

## 2012-05-25 ENCOUNTER — Other Ambulatory Visit: Payer: Self-pay | Admitting: Family Medicine

## 2012-05-27 ENCOUNTER — Telehealth: Payer: Self-pay | Admitting: Family Medicine

## 2012-05-27 NOTE — Telephone Encounter (Signed)
Is this ok?

## 2012-05-29 NOTE — Telephone Encounter (Signed)
dt ?

## 2012-07-24 ENCOUNTER — Encounter: Payer: 59 | Admitting: Family Medicine

## 2012-07-25 ENCOUNTER — Ambulatory Visit (INDEPENDENT_AMBULATORY_CARE_PROVIDER_SITE_OTHER): Payer: 59 | Admitting: Family Medicine

## 2012-07-25 ENCOUNTER — Encounter: Payer: Self-pay | Admitting: Family Medicine

## 2012-07-25 VITALS — BP 120/70 | HR 70 | Ht 69.0 in | Wt 165.0 lb

## 2012-07-25 DIAGNOSIS — E291 Testicular hypofunction: Secondary | ICD-10-CM

## 2012-07-25 DIAGNOSIS — C61 Malignant neoplasm of prostate: Secondary | ICD-10-CM

## 2012-07-25 DIAGNOSIS — Z0000 Encounter for general adult medical examination without abnormal findings: Secondary | ICD-10-CM

## 2012-07-25 DIAGNOSIS — F329 Major depressive disorder, single episode, unspecified: Secondary | ICD-10-CM

## 2012-07-25 DIAGNOSIS — G252 Other specified forms of tremor: Secondary | ICD-10-CM

## 2012-07-25 DIAGNOSIS — F3289 Other specified depressive episodes: Secondary | ICD-10-CM

## 2012-07-25 DIAGNOSIS — Z23 Encounter for immunization: Secondary | ICD-10-CM

## 2012-07-25 DIAGNOSIS — I10 Essential (primary) hypertension: Secondary | ICD-10-CM

## 2012-07-25 DIAGNOSIS — E785 Hyperlipidemia, unspecified: Secondary | ICD-10-CM

## 2012-07-25 DIAGNOSIS — G250 Essential tremor: Secondary | ICD-10-CM

## 2012-07-25 LAB — POCT URINALYSIS DIPSTICK
Bilirubin, UA: NEGATIVE
Blood, UA: NEGATIVE
Glucose, UA: NEGATIVE
Nitrite, UA: NEGATIVE
Spec Grav, UA: 1.01

## 2012-07-25 LAB — COMPREHENSIVE METABOLIC PANEL
AST: 24 U/L (ref 0–37)
Alkaline Phosphatase: 59 U/L (ref 39–117)
BUN: 17 mg/dL (ref 6–23)
Creat: 0.86 mg/dL (ref 0.50–1.35)
Glucose, Bld: 103 mg/dL — ABNORMAL HIGH (ref 70–99)
Potassium: 3.9 mEq/L (ref 3.5–5.3)
Total Bilirubin: 0.5 mg/dL (ref 0.3–1.2)

## 2012-07-25 LAB — CBC WITH DIFFERENTIAL/PLATELET
Basophils Absolute: 0 10*3/uL (ref 0.0–0.1)
Basophils Relative: 1 % (ref 0–1)
Eosinophils Absolute: 0.1 10*3/uL (ref 0.0–0.7)
Eosinophils Relative: 4 % (ref 0–5)
HCT: 39.4 % (ref 39.0–52.0)
MCH: 30.3 pg (ref 26.0–34.0)
MCHC: 34 g/dL (ref 30.0–36.0)
Monocytes Absolute: 0.4 10*3/uL (ref 0.1–1.0)
Monocytes Relative: 12 % (ref 3–12)
Neutro Abs: 1.5 10*3/uL — ABNORMAL LOW (ref 1.7–7.7)
RDW: 13.7 % (ref 11.5–15.5)

## 2012-07-25 LAB — LIPID PANEL
Cholesterol: 113 mg/dL (ref 0–200)
HDL: 51 mg/dL (ref >39)
LDL Cholesterol: 47 mg/dL (ref 0–99)
Triglycerides: 73 mg/dL (ref <150)

## 2012-07-25 NOTE — Progress Notes (Signed)
Subjective:    Patient ID: [name], male    DOB: 24-Aug-1945, 66 y.o.   MRN: [id number]  HPI He is here for complete examination. His wife is with him. The depression screening was administered and he does wear has difficulty dealing with depression. Further discussion indicates the fact that since he has had 2 cancers especially most recent one with prostate cancer he is quite despondent over this and angry. He states that he feels as if he has been depressed for well over a year. He does have hypogonadism and at this time cannot take testosterone. He also states that over the last year he has noted increased difficulty with a slight tremor in his right hand. At this time it is causing very little difficulty and not interfering with his ADLs. He continues on medications listed in chart. Social history was reviewed.   Review of Systems Negative except as above    Objective:   Physical Exam BP 120/70  Pulse 70  Ht 5\' 9"  (1.753 m)  Wt 165 lb (74.844 kg)  BMI 24.37 kg/m2  General Appearance:    Alert, cooperative, no distress, appears stated age  Head:    Normocephalic, without obvious abnormality, atraumatic  Eyes:    PERRL, conjunctiva/corneas clear, EOM's intact, fundi    benign  Ears:    Normal TM's and external ear canals  Nose:   Nares normal, mucosa normal, no drainage or sinus   tenderness  Throat:   Lips, mucosa, and tongue normal; teeth and gums normal  Neck:   Supple, no lymphadenopathy;  thyroid:  no   enlargement/tenderness/nodules; no carotid   bruit or JVD  Back:    Spine nontender, no curvature, ROM normal, no CVA     tenderness  Lungs:     Clear to auscultation bilaterally without wheezes, rales or     ronchi; respirations unlabored  Chest Wall:    No tenderness or deformity   Heart:    Regular rate and rhythm, S1 and S2 normal, no murmur, rub   or gallop  Breast Exam:    No chest wall tenderness, masses or gynecomastia  Abdomen:     Soft, non-tender,  nondistended, normoactive bowel sounds,    no masses, no hepatosplenomegaly  Genitalia:   deferred   Rectal:   deferred   Extremities:   No clubbing, cyanosis or edema  Pulses:   2+ and symmetric all extremities  Skin:   Skin color, texture, turgor normal, no rashes or lesions  Lymph nodes:   Cervical, supraclavicular, and axillary nodes normal  Neurologic:   CNII-XII intact, normal strength, sensation and gait; reflexes 2+ and symmetric throughout          Psych:   Normal mood, affect, hygiene and grooming.          Assessment & Plan:   1. Routine general medical examination at a health care facility  Lipid panel, CBC with Differential, Comprehensive metabolic panel  2. Hypertension  POCT Urinalysis Dipstick  3. Immunization due  Tdap vaccine greater than or equal to 7yo IM  4. Hypogonadism male  Testosterone  5. Prostate cancer    6. Hyperlipidemia  Lipid panel  7. Intention tremor    8. Depression     I will do routine blood screening. Discussed the fact that his low testosterone is at least partially related to his depression symptoms. I will update his immunizations. Recommended counseling for him. We'll check with  oncology concerning an appropriate  psychologist. Maryclare Labrador also check concerning switching him to a different antidepressant medication. Will stop his present psychotropics based on the recommendations. Discussed intention tremor with him. Since he is not having a great deal of difficulty, I do not feel that neurology referral is warranted. This probably is an early onset of Parkinson's disease.

## 2012-07-29 NOTE — Progress Notes (Signed)
  Subjective:    Patient ID: [name], male    DOB: [birth year], 66 y.o.   MRN: [id number]  HPI    Review of Systems     Objective:   Physical Exam        Assessment & Plan:  I reviewed the lab work with him. Recommended that he continue with counseling and discussed possibly switching medications. Recommended seeing a psychiatrist if he would like to do that which she said she would. I gave him the phone number for Dr. Andee Poles. He is to call and let me know when the appointment is made to like to send off medical information

## 2012-10-24 ENCOUNTER — Other Ambulatory Visit: Payer: Self-pay | Admitting: Family Medicine

## 2012-10-31 ENCOUNTER — Encounter: Payer: Self-pay | Admitting: Family Medicine

## 2012-10-31 ENCOUNTER — Ambulatory Visit (INDEPENDENT_AMBULATORY_CARE_PROVIDER_SITE_OTHER): Payer: 59 | Admitting: Family Medicine

## 2012-10-31 VITALS — BP 118/76 | HR 70 | Wt 162.0 lb

## 2012-10-31 DIAGNOSIS — C61 Malignant neoplasm of prostate: Secondary | ICD-10-CM

## 2012-10-31 DIAGNOSIS — F341 Dysthymic disorder: Secondary | ICD-10-CM

## 2012-10-31 NOTE — Progress Notes (Signed)
  Subjective:    Patient ID: [name], male    DOB: 1945/09/15, 67 y.o.   MRN: [id number]  HPI Here for medication check. He has concerns over the medicines still working properly and is interested in stopping them. He admits to not being happy ,irritable, lethargic, moody.presently he is dealing with prostate cancer. He admits to having counseling in the past over the years but it not having much of an effect because he would not open up properly.his wife is with him today.  He also complains of several episodes of tunnel vision with weakness but no sweating, blurred or double vision,no heart rate changes. These symptoms can last for minutes to and then go.these symptoms have improved. He has not had one within the last several weeks.   Review of Systems     Objective:   Physical Exam Alert and in no distress with a slightly flat affect otherwise not examined       Assessment & Plan:  Prostate cancer  Dysthymia  Over half an hour spent discussing all of these issues with him. I strongly encouraged him to get back into counseling. Gave him two different therapists to consider. We also discussed possibly stopping the medicines and switching to something else however at this time I will hold off on this. At the  End of the interview he then brought up about the episodes of the tunnel vision. I asked him to return for further evaluation of this in any other concerns he had. He agreed to this.

## 2012-10-31 NOTE — Patient Instructions (Addendum)
[name] [phone]  Dayton Scrape and I don't know his number by heart The next time you have trouble vision pay attention to weakness, numbness, blurred or double vision and your heart rate and let me know.

## 2012-11-10 ENCOUNTER — Other Ambulatory Visit: Payer: Self-pay | Admitting: Family Medicine

## 2012-11-25 ENCOUNTER — Other Ambulatory Visit: Payer: Self-pay | Admitting: Family Medicine

## 2012-12-02 ENCOUNTER — Other Ambulatory Visit: Payer: Self-pay | Admitting: Family Medicine

## 2013-01-16 ENCOUNTER — Other Ambulatory Visit: Payer: Self-pay | Admitting: Family Medicine

## 2013-02-17 ENCOUNTER — Other Ambulatory Visit: Payer: Self-pay | Admitting: Family Medicine

## 2013-06-09 ENCOUNTER — Other Ambulatory Visit: Payer: Self-pay | Admitting: Family Medicine

## 2013-06-13 ENCOUNTER — Other Ambulatory Visit: Payer: Self-pay | Admitting: Family Medicine

## 2013-06-16 ENCOUNTER — Other Ambulatory Visit: Payer: Self-pay | Admitting: Family Medicine

## 2013-07-21 ENCOUNTER — Other Ambulatory Visit: Payer: Self-pay | Admitting: Family Medicine

## 2013-07-30 ENCOUNTER — Ambulatory Visit (INDEPENDENT_AMBULATORY_CARE_PROVIDER_SITE_OTHER): Payer: Medicare Other | Admitting: Family Medicine

## 2013-07-30 ENCOUNTER — Encounter: Payer: Self-pay | Admitting: Family Medicine

## 2013-07-30 VITALS — BP 112/70 | HR 80 | Ht 68.0 in | Wt 162.0 lb

## 2013-07-30 DIAGNOSIS — C44310 Basal cell carcinoma of skin of unspecified parts of face: Secondary | ICD-10-CM

## 2013-07-30 DIAGNOSIS — I10 Essential (primary) hypertension: Secondary | ICD-10-CM

## 2013-07-30 DIAGNOSIS — M1990 Unspecified osteoarthritis, unspecified site: Secondary | ICD-10-CM

## 2013-07-30 DIAGNOSIS — E785 Hyperlipidemia, unspecified: Secondary | ICD-10-CM

## 2013-07-30 DIAGNOSIS — M129 Arthropathy, unspecified: Secondary | ICD-10-CM

## 2013-07-30 DIAGNOSIS — C61 Malignant neoplasm of prostate: Secondary | ICD-10-CM

## 2013-07-30 DIAGNOSIS — N400 Enlarged prostate without lower urinary tract symptoms: Secondary | ICD-10-CM

## 2013-07-30 DIAGNOSIS — C44319 Basal cell carcinoma of skin of other parts of face: Secondary | ICD-10-CM

## 2013-07-30 DIAGNOSIS — F341 Dysthymic disorder: Secondary | ICD-10-CM

## 2013-07-30 LAB — CBC WITH DIFFERENTIAL/PLATELET
Basophils Relative: 1 % (ref 0–1)
Eosinophils Absolute: 0.1 10*3/uL (ref 0.0–0.7)
HCT: 40.5 % (ref 39.0–52.0)
Hemoglobin: 14 g/dL (ref 13.0–17.0)
Lymphs Abs: 1.2 10*3/uL (ref 0.7–4.0)
MCH: 31.2 pg (ref 26.0–34.0)
MCHC: 34.6 g/dL (ref 30.0–36.0)
MCV: 90.2 fL (ref 78.0–100.0)
Monocytes Absolute: 0.4 10*3/uL (ref 0.1–1.0)
Monocytes Relative: 7 % (ref 3–12)
Neutrophils Relative %: 68 % (ref 43–77)
Platelets: 239 10*3/uL (ref 150–400)
RBC: 4.49 MIL/uL (ref 4.22–5.81)

## 2013-07-30 LAB — LIPID PANEL
Cholesterol: 138 mg/dL (ref 0–200)
HDL: 45 mg/dL (ref >39)
Triglycerides: 108 mg/dL (ref <150)
VLDL: 22 mg/dL (ref 0–40)

## 2013-07-30 LAB — COMPREHENSIVE METABOLIC PANEL
Albumin: 4.2 g/dL (ref 3.5–5.2)
Alkaline Phosphatase: 61 U/L (ref 39–117)
BUN: 19 mg/dL (ref 6–23)
CO2: 25 mEq/L (ref 19–32)
Glucose, Bld: 87 mg/dL (ref 70–99)
Sodium: 138 mEq/L (ref 135–145)
Total Bilirubin: 0.5 mg/dL (ref 0.3–1.2)
Total Protein: 6.3 g/dL (ref 6.0–8.3)

## 2013-07-30 MED ORDER — BUPROPION HCL ER (XL) 300 MG PO TB24: ORAL_TABLET | ORAL | Status: DC

## 2013-07-30 MED ORDER — TERAZOSIN HCL 2 MG PO CAPS: ORAL_CAPSULE | ORAL | Status: DC

## 2013-07-30 MED ORDER — ROSUVASTATIN CALCIUM 20 MG PO TABS: ORAL_TABLET | ORAL | Status: DC

## 2013-07-30 MED ORDER — SERTRALINE HCL 100 MG PO TABS: ORAL_TABLET | ORAL | Status: DC

## 2013-07-30 MED ORDER — DUTASTERIDE 0.5 MG PO CAPS: 0.5000 mg | ORAL_CAPSULE | Freq: Every day | ORAL | Status: DC

## 2013-07-30 NOTE — Progress Notes (Signed)
   Subjective:    Patient ID: [name], male    DOB: 10/01/1945, 67 y.o.   MRN: [id number]  HPI He is here for an IPPE. His medical and social history were reviewed. He does have a history of dysthymia. He has had his functional capacity and safety was reviewed. He does have an advanced directive. He continues on his blood pressure medications and is having no difficulty with this. He also has hyperlipidemia and has concerns over continued need for Niaspan. He continues on Wellbutrin and Zoloft. He has been on these medications for quite some time. As mentioned above he has had difficulty dealing with anger but at this point is not willing to get involved in counseling. He has underlying prostate cancer and is being followed by urology. He is scheduled for another biopsy within the next 6 months. His Gleason score was 6 with 2/26 biopsies positive. He does complain of arthritis especially in the knees which interferes with his ability to exercise. He also is noted the beginning of difficulty with his memory. Also complains of difficulty with constipation. Family and social history were reviewed.   Review of Systems Negative except as above    Objective:   Physical Exam alert and in no distress. Exam of the nose does show an ulcerated lesion with raised waxy margins approximately 1-1/2 cm in size on the left side of the nose. Tympanic membranes and canals are normal. Throat is clear. Tonsils are normal. Neck is supple without adenopathy or thyromegaly. Cardiac exam shows a regular sinus rhythm without murmurs or gallops. Lungs are clear to auscultation. Abdominal exam shows no masses or tenderness with normal bowel sounds.        Assessment & Plan:  BCE (basal cell epithelioma), face - Plan: Ambulatory referral to Dermatology  Hypertension - Plan: US Aorta Initial Medicare Screen, CBC with Differential, Comprehensive metabolic panel  Prostate cancer - Plan: dutasteride (AVODART) 0.5 MG  capsule, terazosin (HYTRIN) 2 MG capsule  Arthritis - Plan: CBC with Differential, Comprehensive metabolic panel  Hyperlipidemia - Plan: Lipid panel, rosuvastatin (CRESTOR) 20 MG tablet  Dysthymia - Plan: buPROPion (WELLBUTRIN XL) 300 MG 24 hr tablet, sertraline (ZOLOFT) 100 MG tablet  BPH (benign prostatic hyperplasia) - Plan: dutasteride (AVODART) 0.5 MG capsule, terazosin (HYTRIN) 2 MG capsule  stress referral to dermatology for the nasal lesion. Explained the surgery necessary for this. Will also continue on all his medications. Discussed prostate cancer and the fact that he has a Gleason score of 6. Discussed the memory issues with him. Plan that since cognitively realizes that he has had memory issues that this could potentially be an early sign of Alzheimer's and needs to be monitored. Also discussed constipation in regard to fluids, bulk in diet, exercise and listening to his body. Stool cards will be given. Discussed counseling and again he is not interested in pursuing this in regard to handling his anger differently.

## 2014-07-07 ENCOUNTER — Encounter: Payer: Self-pay | Admitting: Family Medicine

## 2014-07-13 ENCOUNTER — Other Ambulatory Visit: Payer: Self-pay

## 2014-07-13 ENCOUNTER — Telehealth: Payer: Self-pay | Admitting: Internal Medicine

## 2014-07-13 DIAGNOSIS — E785 Hyperlipidemia, unspecified: Secondary | ICD-10-CM

## 2014-07-13 DIAGNOSIS — C61 Malignant neoplasm of prostate: Secondary | ICD-10-CM

## 2014-07-13 DIAGNOSIS — F341 Dysthymic disorder: Secondary | ICD-10-CM

## 2014-07-13 DIAGNOSIS — N400 Enlarged prostate without lower urinary tract symptoms: Secondary | ICD-10-CM

## 2014-07-13 MED ORDER — DUTASTERIDE 0.5 MG PO CAPS: 0.5000 mg | ORAL_CAPSULE | Freq: Every day | ORAL | Status: DC

## 2014-07-13 MED ORDER — ROSUVASTATIN CALCIUM 20 MG PO TABS: ORAL_TABLET | ORAL | Status: DC

## 2014-07-13 MED ORDER — BUPROPION HCL ER (XL) 300 MG PO TB24: ORAL_TABLET | ORAL | Status: DC

## 2014-07-13 MED ORDER — TERAZOSIN HCL 2 MG PO CAPS: ORAL_CAPSULE | ORAL | Status: DC

## 2014-07-13 NOTE — Telephone Encounter (Signed)
Pt sent [place] a fax and wants [place] to refill his meds until his appt on January 18th. Please send Crestor 20mg , setraline 100mg , avodart 0.5mg , burpropion XL 300mg , terazosin 2mg  to [facility] in [address]. Please call and notify pt when this is done

## 2014-07-13 NOTE — Telephone Encounter (Signed)
Pt advise of med being sent in and he verbalized understanding

## 2014-07-13 NOTE — Telephone Encounter (Signed)
Please take care of this.  

## 2014-07-14 ENCOUNTER — Other Ambulatory Visit: Payer: Self-pay | Admitting: Family Medicine

## 2014-07-14 DIAGNOSIS — C61 Malignant neoplasm of prostate: Secondary | ICD-10-CM

## 2014-07-14 DIAGNOSIS — E785 Hyperlipidemia, unspecified: Secondary | ICD-10-CM

## 2014-07-14 DIAGNOSIS — F341 Dysthymic disorder: Secondary | ICD-10-CM

## 2014-07-14 DIAGNOSIS — N400 Enlarged prostate without lower urinary tract symptoms: Secondary | ICD-10-CM

## 2014-07-14 MED ORDER — ROSUVASTATIN CALCIUM 20 MG PO TABS
ORAL_TABLET | ORAL | Status: DC
Start: 2014-07-14 — End: 2015-08-31

## 2014-07-14 MED ORDER — BUPROPION HCL ER (XL) 300 MG PO TB24
ORAL_TABLET | ORAL | Status: DC
Start: 2014-07-14 — End: 2014-09-03

## 2014-07-14 MED ORDER — SERTRALINE HCL 100 MG PO TABS
ORAL_TABLET | ORAL | Status: DC
Start: 2014-07-14 — End: 2014-09-03

## 2014-07-14 MED ORDER — TERAZOSIN HCL 2 MG PO CAPS
ORAL_CAPSULE | ORAL | Status: DC
Start: 2014-07-14 — End: 2015-08-06

## 2014-07-14 MED ORDER — DUTASTERIDE 0.5 MG PO CAPS: 0.5000 mg | ORAL_CAPSULE | Freq: Every day | ORAL | Status: DC

## 2014-08-04 ENCOUNTER — Encounter: Payer: Medicare Other | Admitting: Family Medicine

## 2014-08-14 ENCOUNTER — Encounter: Payer: Self-pay | Admitting: Internal Medicine

## 2014-08-21 ENCOUNTER — Other Ambulatory Visit: Payer: Self-pay | Admitting: Family Medicine

## 2014-08-21 NOTE — Telephone Encounter (Signed)
Pt has an appt with you Monday. You can decide if you want to fill it now or wait

## 2014-08-24 ENCOUNTER — Ambulatory Visit (INDEPENDENT_AMBULATORY_CARE_PROVIDER_SITE_OTHER): Payer: Medicare Other | Admitting: Family Medicine

## 2014-08-24 ENCOUNTER — Encounter: Payer: Self-pay | Admitting: Family Medicine

## 2014-08-24 VITALS — BP 108/70 | HR 78 | Ht 68.0 in | Wt 157.0 lb

## 2014-08-24 DIAGNOSIS — F329 Major depressive disorder, single episode, unspecified: Secondary | ICD-10-CM

## 2014-08-24 DIAGNOSIS — Z0000 Encounter for general adult medical examination without abnormal findings: Secondary | ICD-10-CM

## 2014-08-24 DIAGNOSIS — E785 Hyperlipidemia, unspecified: Secondary | ICD-10-CM

## 2014-08-24 DIAGNOSIS — G4733 Obstructive sleep apnea (adult) (pediatric): Secondary | ICD-10-CM | POA: Insufficient documentation

## 2014-08-24 DIAGNOSIS — C61 Malignant neoplasm of prostate: Secondary | ICD-10-CM

## 2014-08-24 DIAGNOSIS — R251 Tremor, unspecified: Secondary | ICD-10-CM

## 2014-08-24 DIAGNOSIS — K5900 Constipation, unspecified: Secondary | ICD-10-CM

## 2014-08-24 DIAGNOSIS — M1990 Unspecified osteoarthritis, unspecified site: Secondary | ICD-10-CM

## 2014-08-24 DIAGNOSIS — F32A Depression, unspecified: Secondary | ICD-10-CM

## 2014-08-24 DIAGNOSIS — I10 Essential (primary) hypertension: Secondary | ICD-10-CM

## 2014-08-24 LAB — CBC WITH DIFFERENTIAL/PLATELET
BASOS PCT: 1 % (ref 0–1)
Basophils Absolute: 0 10*3/uL (ref 0.0–0.1)
Eosinophils Absolute: 0.2 10*3/uL (ref 0.0–0.7)
Eosinophils Relative: 4 % (ref 0–5)
HCT: 41.4 % (ref 39.0–52.0)
Hemoglobin: 14.3 g/dL (ref 13.0–17.0)
LYMPHS PCT: 31 % (ref 12–46)
Lymphs Abs: 1.2 10*3/uL (ref 0.7–4.0)
MCH: 31.2 pg (ref 26.0–34.0)
MCHC: 34.5 g/dL (ref 30.0–36.0)
MCV: 90.4 fL (ref 78.0–100.0)
MPV: 10 fL (ref 8.6–12.4)
Monocytes Absolute: 0.4 10*3/uL (ref 0.1–1.0)
Monocytes Relative: 10 % (ref 3–12)
NEUTROS PCT: 54 % (ref 43–77)
Neutro Abs: 2.2 10*3/uL (ref 1.7–7.7)
PLATELETS: 218 10*3/uL (ref 150–400)
RBC: 4.58 MIL/uL (ref 4.22–5.81)
RDW: 13.2 % (ref 11.5–15.5)
WBC: 4 10*3/uL (ref 4.0–10.5)

## 2014-08-24 LAB — LIPID PANEL
CHOL/HDL RATIO: 2.8 ratio
CHOLESTEROL: 157 mg/dL (ref 0–200)
HDL: 57 mg/dL (ref >39)
LDL Cholesterol: 83 mg/dL (ref 0–99)
TRIGLYCERIDES: 84 mg/dL (ref <150)
VLDL: 17 mg/dL (ref 0–40)

## 2014-08-24 LAB — COMPREHENSIVE METABOLIC PANEL
ALT: 20 U/L (ref 0–53)
AST: 22 U/L (ref 0–37)
Albumin: 4.5 g/dL (ref 3.5–5.2)
Alkaline Phosphatase: 53 U/L (ref 39–117)
BUN: 15 mg/dL (ref 6–23)
CO2: 22 meq/L (ref 19–32)
Calcium: 9.4 mg/dL (ref 8.4–10.5)
Chloride: 105 mEq/L (ref 96–112)
Creat: 0.81 mg/dL (ref 0.50–1.35)
Glucose, Bld: 83 mg/dL (ref 70–99)
Potassium: 4.1 mEq/L (ref 3.5–5.3)
Sodium: 137 mEq/L (ref 135–145)
Total Bilirubin: 0.6 mg/dL (ref 0.2–1.2)
Total Protein: 6.9 g/dL (ref 6.0–8.3)

## 2014-08-24 LAB — TSH: TSH: 3.59 u[IU]/mL (ref 0.350–4.500)

## 2014-08-24 MED ORDER — POLYETHYLENE GLYCOL 3350 17 GM/SCOOP PO POWD: 17.0000 g | Freq: Every day | ORAL | Status: DC

## 2014-08-24 NOTE — Progress Notes (Signed)
Subjective:    Patient ID: [name], male    DOB: 1946/04/23, 69 y.o.   MRN: [id number]  HPI He is here for a complete examination. His wife is with him. They have a list of issues to deal with. He apparently has had difficulty with hand tremors that has been progressively getting worse. Apparently he has dropped things and seems to be more clumsy than normal. He even admits to being clumsy and blunting into a more often than he should. There is also cognitive issues with memory. Apparently on occasion he has difficulty staying in his lane while driving. He has also missed paying bills in a timely manner. He admits to being withdrawn, cranky, moody and depressed. He presently is on Wellbutrin and Zoloft. He also has had weight loss over the last several years. Has had a colonoscopy in 2008. He complains of constipation stating he has less BMs and they are harder and larger than in the past. He also complains of joint pains mainly in his hips and knees and does use Advil for this. He does have an underlying history of OSA but presently is not on CPAP stating he doesn't like to wear it. He is being followed by urology for prostate cancer. Has a previous history of testicular CA .   Review of Systems  All other systems reviewed and are negative.      Objective:   Physical Exam BP 108/70 mmHg  Pulse 78  Ht 5\' 8"  (1.727 m)  Wt 157 lb (71.215 kg)  BMI 23.88 kg/m2  SpO2 99%  General Appearance:    Alert, cooperative, no distress, appears stated age  Head:    Normocephalic, without obvious abnormality, atraumatic  Eyes:    PERRL, conjunctiva/corneas clear, EOM's intact, fundi    benign  Ears:    Normal TM's and external ear canals  Nose:   Nares normal, mucosa normal, no drainage or sinus   tenderness  Throat:   Lips, mucosa, and tongue normal; teeth and gums normal  Neck:   Supple, no lymphadenopathy;  thyroid:  no   enlargement/tenderness/nodules; no carotid   bruit or JVD  Back:     Spine nontender, no curvature, ROM normal, no CVA     tenderness  Lungs:     Clear to auscultation bilaterally without wheezes, rales or   ronchi; respirations unlabored  Chest Wall:    No tenderness or deformity   Heart:    Regular rate and rhythm, S1 and S2 normal, no murmur, rub   or gallop  Breast Exam:    No chest wall tenderness, masses or gynecomastia  Abdomen:     Soft, non-tender, nondistended, normoactive bowel sounds,    no masses, no hepatosplenomegaly        Extremities:   No clubbing, cyanosis or edema  Pulses:   2+ and symmetric all extremities  Skin:   Skin color, texture, turgor normal, no rashes or lesions  Lymph nodes:   Cervical, supraclavicular, and axillary nodes normal  Neurologic:   CNII-XII intact, normal strength, sensation and gait; reflexes 2+ and symmetric throughout. Cerebellar testing normal. Normal finger to nose. Only slight tremor noted in his hands.           Psych:   Normal mood, affect, hygiene and grooming.          Assessment & Plan:  Depression  Essential hypertension - Plan: CBC with Differential, Comprehensive metabolic panel  Arthritis  Hyperlipidemia - Plan: Lipid  panel  Prostate cancer  Tremor - Plan: TSH, Ambulatory referral to Neurology  Constipation, unspecified constipation type - Plan: polyethylene glycol powder (GLYCOLAX/MIRALAX) powder  OSA (obstructive sleep apnea)  Routine general medical examination at a health care facility - Plan: CBC with Differential, Comprehensive metabolic panel, Lipid panel  I will taper him off Wellbutrin and Zoloft and place him on Viibryd. Discussed possible withdrawal from these meds and he will call me if he has any difficulty. His symptoms can certainly be depression related although neurologic issues also a concern. He apparently has had testosterone tested in the past however with his prostate cancer intervention there is really not appropriate Recommended adding MiraLAX to his regimen to  help with the constipation. Recheck here in 3 weeks.

## 2014-08-24 NOTE — Patient Instructions (Signed)
Constipation  Constipation is when a person has fewer than three bowel movements a week, has difficulty having a bowel movement, or has stools that are dry, hard, or larger than normal. As people grow older, constipation is more common. If you try to fix constipation with medicines that make you have a bowel movement (laxatives), the problem may get worse. Long-term laxative use may cause the muscles of the colon to become weak. A low-fiber diet, not taking in enough fluids, and taking certain medicines may make constipation worse.   CAUSES   · Certain medicines, such as antidepressants, pain medicine, iron supplements, antacids, and water pills.    · Certain diseases, such as diabetes, irritable bowel syndrome (IBS), thyroid disease, or depression.    · Not drinking enough water.    · Not eating enough fiber-rich foods.    · Stress or travel.    · Lack of physical activity or exercise.    · Ignoring the urge to have a bowel movement.    · Using laxatives too much.    SIGNS AND SYMPTOMS   · Having fewer than three bowel movements a week.    · Straining to have a bowel movement.    · Having stools that are hard, dry, or larger than normal.    · Feeling full or bloated.    · Pain in the lower abdomen.    · Not feeling relief after having a bowel movement.    DIAGNOSIS   Your health care provider will take a medical history and perform a physical exam. Further testing may be done for severe constipation. Some tests may include:  · A barium enema X-ray to examine your rectum, colon, and, sometimes, your small intestine.    · A sigmoidoscopy to examine your lower colon.    · A colonoscopy to examine your entire colon.  TREATMENT   Treatment will depend on the severity of your constipation and what is causing it. Some dietary treatments include drinking more fluids and eating more fiber-rich foods. Lifestyle treatments may include regular exercise. If these diet and lifestyle recommendations do not help, your health care  provider may recommend taking over-the-counter laxative medicines to help you have bowel movements. Prescription medicines may be prescribed if over-the-counter medicines do not work.   HOME CARE INSTRUCTIONS   · Eat foods that have a lot of fiber, such as fruits, vegetables, whole grains, and beans.  · Limit foods high in fat and processed sugars, such as french fries, hamburgers, cookies, candies, and soda.    · A fiber supplement may be added to your diet if you cannot get enough fiber from foods.    · Drink enough fluids to keep your urine clear or pale yellow.    · Exercise regularly or as directed by your health care provider.    · Go to the restroom when you have the urge to go. Do not hold it.    · Only take over-the-counter or prescription medicines as directed by your health care provider. Do not take other medicines for constipation without talking to your health care provider first.    SEEK IMMEDIATE MEDICAL CARE IF:   · You have bright red blood in your stool.    · Your constipation lasts for more than 4 days or gets worse.    · You have abdominal or rectal pain.    · You have thin, pencil-like stools.    · You have unexplained weight loss.  MAKE SURE YOU:   · Understand these instructions.  · Will watch your condition.  · Will get help right away if you are not   you have with your health care provider. Taper off the Wellbutrin and the Zoloft by cutting it in half or roughly 1 week., Then stop Start the Viibryd at that time. She have any symptoms are concerned about call me. 3 weeks

## 2014-08-27 ENCOUNTER — Telehealth: Payer: Self-pay | Admitting: Family Medicine

## 2014-08-27 NOTE — Telephone Encounter (Signed)
Pt called and stated he would like a call back from you. He would like to discuss up coming neuro appt. Pt can be reached at [phone].

## 2014-09-03 ENCOUNTER — Encounter: Payer: Self-pay | Admitting: Neurology

## 2014-09-03 ENCOUNTER — Ambulatory Visit (INDEPENDENT_AMBULATORY_CARE_PROVIDER_SITE_OTHER): Payer: Medicare Other | Admitting: Neurology

## 2014-09-03 VITALS — BP 128/68 | HR 100 | Ht 69.0 in | Wt 160.0 lb

## 2014-09-03 DIAGNOSIS — G250 Essential tremor: Secondary | ICD-10-CM

## 2014-09-03 DIAGNOSIS — R292 Abnormal reflex: Secondary | ICD-10-CM

## 2014-09-03 DIAGNOSIS — G3184 Mild cognitive impairment, so stated: Secondary | ICD-10-CM
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
## 2014-09-09 ENCOUNTER — Other Ambulatory Visit: Payer: Self-pay

## 2014-09-09 MED ORDER — VILAZODONE HCL 20 MG PO TABS: 20.0000 mg | ORAL_TABLET | Freq: Every day | ORAL | Status: DC

## 2014-09-16 ENCOUNTER — Encounter: Payer: Self-pay | Admitting: Family Medicine

## 2014-09-16 ENCOUNTER — Ambulatory Visit (INDEPENDENT_AMBULATORY_CARE_PROVIDER_SITE_OTHER): Payer: Medicare Other | Admitting: Family Medicine

## 2014-09-16 VITALS — BP 116/82 | HR 72 | Wt 160.0 lb

## 2014-09-16 DIAGNOSIS — F329 Major depressive disorder, single episode, unspecified: Secondary | ICD-10-CM

## 2014-09-16 DIAGNOSIS — F32A Depression, unspecified: Secondary | ICD-10-CM

## 2014-09-16 MED ORDER — VILAZODONE HCL 20 MG PO TABS: 20.0000 mg | ORAL_TABLET | Freq: Every day | ORAL | Status: DC

## 2014-09-16 NOTE — Progress Notes (Signed)
   Subjective:    Patient ID: [name], male    DOB: 05-16-1946, 70 y.o.   MRN: [id number]  HPI He is here for consult. He was recently seen by neurology. That note was reviewed. It mentioned the possibility of an MRI due to hyperreflexia. He does not report any difficulty with weakness, numbness, tingling, headache, abnormal gait. He also is on a new antidepressant. He states that he has noted an improvement in his anhedonia. His wife states that he is still just as irritable   Review of Systems     Objective:   Physical Exam alert and in no distress. Reflexes are essentially normal except knees were 3+. Negative clonus.      Assessment & Plan:  Depression - Plan: Vilazodone HCl 20 MG TABS  continue on present antidepressant and follow-up in one month. Discussed at length the fact that he has no other neurologic symptoms and an MRI at this point I don't think would be appropriate. He then mentioned memory issues and I explained that to her separate that the present time in evaluation there would start with a MMSE

## 2014-10-19 ENCOUNTER — Ambulatory Visit (INDEPENDENT_AMBULATORY_CARE_PROVIDER_SITE_OTHER): Payer: Medicare Other | Admitting: Family Medicine

## 2014-10-19 ENCOUNTER — Encounter: Payer: Self-pay | Admitting: Family Medicine

## 2014-10-19 VITALS — BP 110/78 | HR 76 | Wt 162.0 lb

## 2014-10-19 DIAGNOSIS — F329 Major depressive disorder, single episode, unspecified: Secondary | ICD-10-CM

## 2014-10-19 DIAGNOSIS — F323 Major depressive disorder, single episode, severe with psychotic features: Secondary | ICD-10-CM | POA: Insufficient documentation

## 2014-10-19 DIAGNOSIS — F32A Depression, unspecified: Secondary | ICD-10-CM

## 2014-10-19 MED ORDER — VORTIOXETINE HBR 5 MG PO TABS
1.0000 | ORAL_TABLET | Freq: Every morning | ORAL | Status: DC
Start: 2014-10-19 — End: 2014-11-10

## 2014-10-19 NOTE — Progress Notes (Signed)
   Subjective:    Patient ID: [name], male    DOB: [birth year], 69 y.o.   MRN: [id number]  HPI He is here for a recheck. He states that he feels worse having more difficulty with sleep and more irritable. He is unwilling to continue on the present medication. His wife is with him and she agrees.   Review of Systems     Objective:   Physical Exam Alert and in no distress with appropriate affect otherwise not examined       Assessment & Plan:  Depression I will place him on Brintellix at 5 mg. He will stop his  Viibryd He will return here in one month. Possible side effects were discussed with him.

## 2014-11-09 ENCOUNTER — Telehealth: Payer: Self-pay | Admitting: Family Medicine

## 2014-11-09 NOTE — Telephone Encounter (Signed)
Pt called and stated that he was given samples of Brintellix and told to come back in one month. He has appt scheduled for a month but has already ran out of medication. Please pull samples, enough to last until next appt, and pt will stop by tomorrow.

## 2014-11-10 ENCOUNTER — Other Ambulatory Visit: Payer: Self-pay

## 2014-11-10 DIAGNOSIS — F32A Depression, unspecified: Secondary | ICD-10-CM

## 2014-11-10 DIAGNOSIS — F329 Major depressive disorder, single episode, unspecified: Secondary | ICD-10-CM

## 2014-11-10 MED ORDER — VORTIOXETINE HBR 5 MG PO TABS: 1.0000 | ORAL_TABLET | Freq: Every morning | ORAL | Status: DC

## 2014-11-19 ENCOUNTER — Ambulatory Visit (INDEPENDENT_AMBULATORY_CARE_PROVIDER_SITE_OTHER): Payer: Medicare Other | Admitting: Family Medicine

## 2014-11-19 DIAGNOSIS — F329 Major depressive disorder, single episode, unspecified: Secondary | ICD-10-CM | POA: Diagnosis not present

## 2014-11-19 DIAGNOSIS — F32A Depression, unspecified: Secondary | ICD-10-CM

## 2014-11-19 MED ORDER — VORTIOXETINE HBR 5 MG PO TABS: 1.0000 | ORAL_TABLET | Freq: Every morning | ORAL | Status: DC

## 2014-11-19 NOTE — Progress Notes (Signed)
   Subjective:    Patient ID: [name], male    DOB: February 26, 1946, 69 y.o.   MRN: [id number]  HPI He is here for recheck. He is with his wife. He does state that he is roughly 75% better. He is happy with the progress. He wants to know if we need increase in dosing.   Review of Systems     Objective:   Physical Exam Alert and in no distress with appropriate affect and appropriately dressed.       Assessment & Plan:  Depression - Plan: Vortioxetine HBr (BRINTELLIX) 5 MG TABS he is to contact me in approximately one month and let me know how he is doing. I explained that he is probably still not getting the full effect of the medication.

## 2014-11-19 NOTE — Patient Instructions (Signed)
Leave a message on my chart on how you are doing in one month

## 2015-02-01 ENCOUNTER — Other Ambulatory Visit: Payer: Self-pay

## 2015-03-04 ENCOUNTER — Encounter: Payer: Self-pay | Admitting: *Deleted

## 2015-03-25 ENCOUNTER — Encounter: Payer: Self-pay | Admitting: Cardiovascular Disease

## 2015-03-25 ENCOUNTER — Encounter: Payer: Self-pay | Admitting: Cardiology

## 2015-03-25 LAB — PULMONARY FUNCTION TEST

## 2015-06-10 ENCOUNTER — Other Ambulatory Visit: Payer: Self-pay | Admitting: Family Medicine

## 2015-06-11 NOTE — Telephone Encounter (Signed)
Is this okay?

## 2015-06-11 NOTE — Telephone Encounter (Signed)
Needs a follow up appt.

## 2015-07-18 ENCOUNTER — Other Ambulatory Visit: Payer: Self-pay | Admitting: Family Medicine

## 2015-07-19 ENCOUNTER — Other Ambulatory Visit: Payer: Self-pay | Admitting: Family Medicine

## 2015-07-19 DIAGNOSIS — N400 Enlarged prostate without lower urinary tract symptoms: Secondary | ICD-10-CM

## 2015-07-19 DIAGNOSIS — C61 Malignant neoplasm of prostate: Secondary | ICD-10-CM

## 2015-07-19 MED ORDER — DUTASTERIDE 0.5 MG PO CAPS: 0.5000 mg | ORAL_CAPSULE | Freq: Every day | ORAL | Status: DC

## 2015-07-19 NOTE — Telephone Encounter (Signed)
Is this okay to refill? 

## 2015-08-06 ENCOUNTER — Other Ambulatory Visit: Payer: Self-pay | Admitting: Family Medicine

## 2015-08-31 ENCOUNTER — Other Ambulatory Visit: Payer: Self-pay | Admitting: Family Medicine

## 2015-08-31 ENCOUNTER — Encounter: Payer: Self-pay | Admitting: Family Medicine

## 2015-08-31 ENCOUNTER — Ambulatory Visit (INDEPENDENT_AMBULATORY_CARE_PROVIDER_SITE_OTHER): Payer: Medicare Other | Admitting: Family Medicine

## 2015-08-31 VITALS — BP 110/68 | HR 78 | Ht 69.0 in | Wt 158.0 lb

## 2015-08-31 DIAGNOSIS — Z136 Encounter for screening for cardiovascular disorders: Secondary | ICD-10-CM

## 2015-08-31 DIAGNOSIS — G4730 Sleep apnea, unspecified: Secondary | ICD-10-CM

## 2015-08-31 DIAGNOSIS — E291 Testicular hypofunction: Secondary | ICD-10-CM | POA: Diagnosis not present

## 2015-08-31 DIAGNOSIS — I10 Essential (primary) hypertension: Secondary | ICD-10-CM

## 2015-08-31 DIAGNOSIS — Z8249 Family history of ischemic heart disease and other diseases of the circulatory system: Secondary | ICD-10-CM | POA: Insufficient documentation

## 2015-08-31 DIAGNOSIS — R12 Heartburn: Secondary | ICD-10-CM

## 2015-08-31 DIAGNOSIS — F329 Major depressive disorder, single episode, unspecified: Secondary | ICD-10-CM

## 2015-08-31 DIAGNOSIS — E785 Hyperlipidemia, unspecified: Secondary | ICD-10-CM

## 2015-08-31 DIAGNOSIS — G4733 Obstructive sleep apnea (adult) (pediatric): Secondary | ICD-10-CM | POA: Diagnosis not present

## 2015-08-31 DIAGNOSIS — C61 Malignant neoplasm of prostate: Secondary | ICD-10-CM

## 2015-08-31 DIAGNOSIS — Z125 Encounter for screening for malignant neoplasm of prostate: Secondary | ICD-10-CM

## 2015-08-31 DIAGNOSIS — Z8546 Personal history of malignant neoplasm of prostate: Secondary | ICD-10-CM

## 2015-08-31 DIAGNOSIS — Z23 Encounter for immunization: Secondary | ICD-10-CM | POA: Diagnosis not present

## 2015-08-31 DIAGNOSIS — F32A Depression, unspecified: Secondary | ICD-10-CM

## 2015-08-31 DIAGNOSIS — N400 Enlarged prostate without lower urinary tract symptoms: Secondary | ICD-10-CM | POA: Diagnosis not present

## 2015-08-31 DIAGNOSIS — Z1159 Encounter for screening for other viral diseases: Secondary | ICD-10-CM

## 2015-08-31 LAB — COMPREHENSIVE METABOLIC PANEL
ALK PHOS: 47 U/L (ref 40–115)
ALT: 26 U/L (ref 9–46)
AST: 24 U/L (ref 10–35)
Albumin: 4.4 g/dL (ref 3.6–5.1)
BILIRUBIN TOTAL: 1 mg/dL (ref 0.2–1.2)
BUN: 16 mg/dL (ref 7–25)
CO2: 25 mmol/L (ref 20–31)
Calcium: 9.2 mg/dL (ref 8.6–10.3)
Chloride: 107 mmol/L (ref 98–110)
Creat: 0.8 mg/dL (ref 0.70–1.25)
GLUCOSE: 91 mg/dL (ref 65–99)
Potassium: 4 mmol/L (ref 3.5–5.3)
Sodium: 140 mmol/L (ref 135–146)
Total Protein: 6.4 g/dL (ref 6.1–8.1)

## 2015-08-31 LAB — CBC WITH DIFFERENTIAL/PLATELET
BASOS ABS: 0 10*3/uL (ref 0.0–0.1)
Basophils Relative: 1 % (ref 0–1)
EOS ABS: 0.1 10*3/uL (ref 0.0–0.7)
EOS PCT: 3 % (ref 0–5)
HEMATOCRIT: 42.4 % (ref 39.0–52.0)
Hemoglobin: 14.5 g/dL (ref 13.0–17.0)
LYMPHS PCT: 25 % (ref 12–46)
Lymphs Abs: 1.1 10*3/uL (ref 0.7–4.0)
MCH: 31.1 pg (ref 26.0–34.0)
MCHC: 34.2 g/dL (ref 30.0–36.0)
MCV: 91 fL (ref 78.0–100.0)
MPV: 9.7 fL (ref 8.6–12.4)
Monocytes Absolute: 0.4 10*3/uL (ref 0.1–1.0)
Monocytes Relative: 10 % (ref 3–12)
NEUTROS PCT: 61 % (ref 43–77)
Neutro Abs: 2.7 10*3/uL (ref 1.7–7.7)
PLATELETS: 201 10*3/uL (ref 150–400)
RBC: 4.66 MIL/uL (ref 4.22–5.81)
RDW: 13.4 % (ref 11.5–15.5)
WBC: 4.4 10*3/uL (ref 4.0–10.5)

## 2015-08-31 LAB — LIPID PANEL
Cholesterol: 146 mg/dL (ref 125–200)
HDL: 54 mg/dL (ref >=40)
LDL CALC: 75 mg/dL (ref <130)
Total CHOL/HDL Ratio: 2.7 Ratio (ref <=5.0)
Triglycerides: 83 mg/dL (ref <150)
VLDL: 17 mg/dL (ref <30)

## 2015-08-31 MED ORDER — DUTASTERIDE 0.5 MG PO CAPS: 0.5000 mg | ORAL_CAPSULE | Freq: Every day | ORAL | Status: DC

## 2015-08-31 MED ORDER — CRESTOR 20 MG PO TABS: ORAL_TABLET | ORAL | Status: DC

## 2015-08-31 MED ORDER — TERAZOSIN HCL 2 MG PO CAPS: 2.0000 mg | ORAL_CAPSULE | Freq: Every day | ORAL | Status: DC

## 2015-08-31 NOTE — Patient Instructions (Signed)
Insomnia Insomnia is a sleep disorder that makes it difficult to fall asleep or to stay asleep. Insomnia can cause tiredness (fatigue), low energy, difficulty concentrating, mood swings, and poor performance at work or school.  There are three different ways to classify insomnia:  Difficulty falling asleep.  Difficulty staying asleep.  Waking up too early in the morning. Any type of insomnia can be long-term (chronic) or short-term (acute). Both are common. Short-term insomnia usually lasts for three months or less. Chronic insomnia occurs at least three times a week for longer than three months. CAUSES  Insomnia may be caused by another condition, situation, or substance, such as:  Anxiety.  Certain medicines.  Gastroesophageal reflux disease (GERD) or other gastrointestinal conditions.  Asthma or other breathing conditions.  Restless legs syndrome, sleep apnea, or other sleep disorders.  Chronic pain.  Menopause. This may include hot flashes.  Stroke.  Abuse of alcohol, tobacco, or illegal drugs.  Depression.  Caffeine.   Neurological disorders, such as Alzheimer disease.  An overactive thyroid (hyperthyroidism). The cause of insomnia may not be known. RISK FACTORS Risk factors for insomnia include:  Gender. Women are more commonly affected than men.  Age. Insomnia is more common as you get older.  Stress. This may involve your professional or personal life.  Income. Insomnia is more common in people with lower income.  Lack of exercise.   Irregular work schedule or night shifts.  Traveling between different time zones. SIGNS AND SYMPTOMS If you have insomnia, trouble falling asleep or trouble staying asleep is the main symptom. This may lead to other symptoms, such as:  Feeling fatigued.  Feeling nervous about going to sleep.  Not feeling rested in the morning.  Having trouble concentrating.  Feeling irritable, anxious, or depressed. TREATMENT   Treatment for insomnia depends on the cause. If your insomnia is caused by an underlying condition, treatment will focus on addressing the condition. Treatment may also include:   Medicines to help you sleep.  Counseling or therapy.  Lifestyle adjustments. HOME CARE INSTRUCTIONS   Take medicines only as directed by your health care provider.  Keep regular sleeping and waking hours. Avoid naps.  Keep a sleep diary to help you and your health care provider figure out what could be causing your insomnia. Include:   When you sleep.  When you wake up during the night.  How well you sleep.   How rested you feel the next day.  Any side effects of medicines you are taking.  What you eat and drink.   Make your bedroom a comfortable place where it is easy to fall asleep:  Put up shades or special blackout curtains to block light from outside.  Use a white noise machine to block noise.  Keep the temperature cool.   Exercise regularly as directed by your health care provider. Avoid exercising right before bedtime.  Use relaxation techniques to manage stress. Ask your health care provider to suggest some techniques that may work well for you. These may include:  Breathing exercises.  Routines to release muscle tension.  Visualizing peaceful scenes.  Cut back on alcohol, caffeinated beverages, and cigarettes, especially close to bedtime. These can disrupt your sleep.  Do not overeat or eat spicy foods right before bedtime. This can lead to digestive discomfort that can make it hard for you to sleep.  Limit screen use before bedtime. This includes:  Watching TV.  Using your smartphone, tablet, and computer.  Stick to a routine. This   can help you fall asleep faster. Try to do a quiet activity, brush your teeth, and go to bed at the same time each night.  Get out of bed if you are still awake after 15 minutes of trying to sleep. Keep the lights down, but try reading or  doing a quiet activity. When you feel sleepy, go back to bed.  Make sure that you drive carefully. Avoid driving if you feel very sleepy.  Keep all follow-up appointments as directed by your health care provider. This is important. SEEK MEDICAL CARE IF:   You are tired throughout the day or have trouble in your daily routine due to sleepiness.  You continue to have sleep problems or your sleep problems get worse. SEEK IMMEDIATE MEDICAL CARE IF:   You have serious thoughts about hurting yourself or someone else.   This information is not intended to replace advice given to you by your health care provider. Make sure you discuss any questions you have with your health care provider.   Document Released: 07/21/2000 Document Revised: 04/14/2015 Document Reviewed: 04/24/2014 [employer] Interactive Patient Education 2016 [employer]  

## 2015-08-31 NOTE — Progress Notes (Signed)
[name] is a 70 y.o. male who presents for annual wellness visit and follow-up on chronic medical conditions.  He has the following concerns:He has noted difficulty with his memory which has him quite frustrated. He continues on Trintellix however further questioning indicates he still having difficulty with depression. He also has difficulty staying asleep. He says if he can sleep for only 3 hours. Further discussion indicates that he usually will take a quick nap around 8 in the evening. He also has OSA but presently is not using his CPAP. He also is not taking his testosterone and has no desire to start back on either of these.He does have a previous history of testicular cancer as well as prostate cancer. He continues on terazosin and Avodart. There is also a previous family history of cardiac disease. He has seen cardiology in the distant past.He has had no difficulty with chest pain, shortness of breath, PND or DOE.Family and social history as well as health maintenance and immunizations were reviewed.  Immunization History  Administered Date(s) Administered  . Influenza Split 04/18/2012, 05/06/2013, 05/27/2015  . Influenza Whole 04/12/2011  . Influenza-Unspecified 04/23/2014  . Pneumococcal Conjugate-13 08/31/2015  . Pneumococcal Polysaccharide-23 12/09/2009  . Tdap 01/17/2000, 07/25/2012  . Zoster 12/08/2008   Last colonoscopy: 2007 Last PSA today: Dentist:? Ophtho:? Exercise:rare Other doctors caring for patient include:[facility]essner   Depression screen:  See questionnaire below.     Depression screen [facility] 2/9 08/31/2015 08/24/2014 07/25/2012 07/25/2012  Decreased Interest 3 3 3 2   Down, Depressed, Hopeless 3 3 3 2   PHQ - 2 Score 6 6 6 4   Altered sleeping 2 - 1 -  Tired, decreased energy 2 - 3 -  Change in appetite 3 - 1 -  Feeling bad or failure about yourself  0 - 0 -  Trouble concentrating 3 - 2 -  Moving slowly or fidgety/restless 0 - 0 -  Suicidal thoughts 0 - 0 -  PHQ-9  Score 16 - 13 -  Difficult doing work/chores Somewhat difficult - - -    Fall Screen: See Questionaire below.   Fall Risk  08/31/2015 08/24/2014 07/30/2013 07/25/2012  Falls in the past year? No No No No    ADL screen:  See questionnaire below.  Functional Status Survey:     End of Life Discussion:  Patient does not have a living will and medical power of attorney information given   Review of Systems  Constitutional: -fever, -chills, -sweats, -unexpected weight change, -anorexia, -fatigue Allergy: -sneezing, -itching, -congestion Dermatology: denies changing moles, rash, lumps, new worrisome lesions ENT: -runny nose, -ear pain, -sore throat, -hoarseness, -sinus pain, -teeth pain, -tinnitus, -hearing loss, -epistaxis Cardiology:  -chest pain, -palpitations, -edema, -orthopnea, -paroxysmal nocturnal dyspnea Respiratory: -cough, -shortness of breath, -dyspnea on exertion, -wheezing, -hemoptysis Gastroenterology: -abdominal pain, -nausea, -vomiting, -diarrhea, -constipation, -blood in stool, -changes in bowel movement, -dysphagia Hematology: -bleeding or bruising problems Musculoskeletal: -arthralgias, -myalgias, -joint swelling, -back pain, -neck pain, -cramping, -gait changes Ophthalmology: -vision changes, -eye redness, -itching, -discharge Urology: -dysuria, -difficulty urinating, -hematuria, -urinary frequency, -urgency, incontinence Neurology: -headache, -weakness, -tingling, -numbness, -speech abnormality, -memory loss, -falls, -dizziness Psychology:  sleep problems   PHYSICAL EXAM:  BP 110/68 mmHg  Pulse 78  Ht 5\' 9"  (1.753 m)  Wt 158 lb (71.668 kg)  BMI 23.32 kg/m2  SpO2 95%  General Appearance: Alert, cooperative, no distress, appears stated age Head: Normocephalic, without obvious abnormality, atraumatic Eyes: PERRL, conjunctiva/corneas clear, EOM's intact, fundi benign Ears: Normal TM's and  external ear canals Nose: Nares normal, mucosa normal, no drainage or  sinus   tenderness Throat: Lips, mucosa, and tongue normal; teeth and gums normal Neck: Supple, no lymphadenopathy, thyroid:no enlargement/tenderness/nodules; no carotid bruit or JVD Back: Spine nontender, no curvature, ROM normal, no CVA tenderness Lungs: Clear to auscultation bilaterally without wheezes, rales or ronchi; respirations unlabored Chest Wall: No tenderness or deformity Heart: Regular rate and rhythm, S1 and S2 normal, no murmur, rub or gallop  Extremities: No clubbing, cyanosis or edema Pulses: 2+ and symmetric all extremities Skin: Skin color, texture, turgor normal, no rashes or lesions Lymph nodes: Cervical, supraclavicular, and axillary nodes normal Neurologic: CNII-XII intact, normal strength, sensation and gait; reflexes 2+ and symmetric throughout   Psych: Normal mood, affect, hygiene and grooming  ASSESSMENT/PLAN: Depression  Prostate cancer ([place]) - Plan: dutasteride (AVODART) 0.5 MG capsule  BPH (benign prostatic hyperplasia) - Plan: dutasteride (AVODART) 0.5 MG capsule, CBC with Differential/Platelet, Comprehensive metabolic panel  Hyperlipidemia - Plan: CRESTOR 20 MG tablet, CBC with Differential/Platelet, Comprehensive metabolic panel, Lipid panel  Need for prophylactic vaccination against Streptococcus pneumoniae (pneumococcus) - Plan: Pneumococcal conjugate vaccine 13-valent  Screening for AAA (abdominal aortic aneurysm) - Plan: US Aorta Initial Medicare Screen, AAA Duplex  Sleep apnea  Essential hypertension - Plan: terazosin (HYTRIN) 2 MG capsule, CBC with Differential/Platelet, Comprehensive metabolic panel  Heartburn  Hypogonadism male  OSA (obstructive sleep apnea)  Need for hepatitis C screening test - Plan: Hepatitis C antibody  Screening for prostate cancer - Plan: PSA, Medicare  History of prostate cancer  Family history of heart disease in male family member before age 23 His depression screening was 57. I decided to increase his  Trintellix to 10 mg. Samples were given. He is to return here in one month for follow-up on that. Discussed the memory issues with him and I will follow-up on that based on how well he responds to the 10 tell aches. We discussed testosterone and sleep apnea but he is not interested in pursuing this and starting back on them. He will be continued on his other medications. Ultrasound for AAA also ordered.    ecommended at least 30 minutes of aerobic activity at least 5 days/week;  changing batteries in smoke detectors. Immunization recommendations discussed.  Colonoscopy recommendations reviewed.   Medicare Attestation I have personally reviewed: The patient's medical and social history Their use of alcohol, tobacco or illicit drugs Their current medications and supplements The patient's functional ability including ADLs,fall risks, home safety risks, cognitive, and hearing and visual impairment Diet and physical activities Evidence for depression or mood disorders  The patient's weight, height, and BMI have been recorded in the chart.  I have made referrals, counseling, and provided education to the patient based on review of the above and I have provided the patient with a written personalized care plan for preventive services.     [name], MD   08/31/2015

## 2015-09-01 LAB — HEPATITIS C ANTIBODY: HCV AB: NEGATIVE

## 2015-09-01 LAB — PSA, MEDICARE: PSA: 1.74 ng/mL (ref <=4.00)

## 2015-09-02 ENCOUNTER — Telehealth: Payer: Self-pay | Admitting: Family Medicine

## 2015-09-02 NOTE — Telephone Encounter (Signed)
Rcvd form from [facility] requesting a signature to authorization rewriting Crestor script as a generic for cost saving purposes. Form placed in Dr Lanice Shirts folder for review.

## 2015-09-10 ENCOUNTER — Ambulatory Visit (HOSPITAL_COMMUNITY)
Admission: RE | Admit: 2015-09-10 | Discharge: 2015-09-10 | Disposition: A | Discharge status: Home / Self Care | Payer: Medicare Other | Source: Ambulatory Visit | Attending: Family Medicine | Admitting: Family Medicine

## 2015-09-10 DIAGNOSIS — Z136 Encounter for screening for cardiovascular disorders: Secondary | ICD-10-CM | POA: Diagnosis not present

## 2015-09-29 ENCOUNTER — Ambulatory Visit (INDEPENDENT_AMBULATORY_CARE_PROVIDER_SITE_OTHER): Payer: Medicare Other | Admitting: Family Medicine

## 2015-09-29 ENCOUNTER — Encounter: Payer: Self-pay | Admitting: Family Medicine

## 2015-09-29 ENCOUNTER — Ambulatory Visit: Payer: Medicare Other | Admitting: Family Medicine

## 2015-09-29 VITALS — BP 120/82 | HR 72 | Wt 158.6 lb

## 2015-09-29 DIAGNOSIS — F329 Major depressive disorder, single episode, unspecified: Secondary | ICD-10-CM | POA: Diagnosis not present

## 2015-09-29 DIAGNOSIS — F32A Depression, unspecified: Secondary | ICD-10-CM

## 2015-09-29 MED ORDER — VORTIOXETINE HBR 20 MG PO TABS: 20.0000 mg | ORAL_TABLET | Freq: Every day | ORAL | Status: DC

## 2015-09-29 NOTE — Patient Instructions (Signed)
Call me in 1 month and let me know how you're doing

## 2015-09-29 NOTE — Progress Notes (Signed)
   Subjective:    Patient ID: [name], male    DOB: 02/27/1946, 70 y.o.   MRN: [id number]  HPI He is here for recheck. Both he and his wife state that heis really made no progress in dealing with his underlying depression   Review of Systems     Objective:   Physical Exam And in no distress with appropriate affect       Assessment & Plan:  Depression - Plan: Vortioxetine HBr (TRINTELLIX) 20 MG TABS I will his  medication to maximum dosing and if no improvement, refer to psychiatry for further care.he is to call me in 1 month.

## 2016-03-25 ENCOUNTER — Other Ambulatory Visit: Payer: Self-pay | Admitting: Family Medicine

## 2016-03-25 DIAGNOSIS — F32A Depression, unspecified: Secondary | ICD-10-CM

## 2016-03-25 DIAGNOSIS — F329 Major depressive disorder, single episode, unspecified: Secondary | ICD-10-CM

## 2016-03-27 ENCOUNTER — Telehealth: Payer: Self-pay

## 2016-03-27 NOTE — Telephone Encounter (Signed)
Is this okay to refill? 

## 2016-03-27 NOTE — Telephone Encounter (Signed)
error 

## 2016-06-08 ENCOUNTER — Encounter: Payer: Self-pay | Admitting: Family Medicine

## 2016-06-08 ENCOUNTER — Ambulatory Visit (INDEPENDENT_AMBULATORY_CARE_PROVIDER_SITE_OTHER): Payer: Medicare Other | Admitting: Family Medicine

## 2016-06-08 VITALS — BP 124/86 | HR 70 | Ht 69.0 in | Wt 161.0 lb

## 2016-06-08 DIAGNOSIS — M25551 Pain in right hip: Secondary | ICD-10-CM | POA: Diagnosis not present

## 2016-06-08 DIAGNOSIS — G250 Essential tremor: Secondary | ICD-10-CM | POA: Diagnosis not present

## 2016-06-08 DIAGNOSIS — M25552 Pain in left hip: Secondary | ICD-10-CM

## 2016-06-08 DIAGNOSIS — C61 Malignant neoplasm of prostate: Secondary | ICD-10-CM | POA: Diagnosis not present

## 2016-06-08 DIAGNOSIS — R292 Abnormal reflex: Secondary | ICD-10-CM | POA: Insufficient documentation

## 2016-06-08 DIAGNOSIS — G3184 Mild cognitive impairment, so stated: Secondary | ICD-10-CM | POA: Diagnosis not present

## 2016-06-08 DIAGNOSIS — E785 Hyperlipidemia, unspecified: Secondary | ICD-10-CM

## 2016-06-08 DIAGNOSIS — F329 Major depressive disorder, single episode, unspecified: Secondary | ICD-10-CM

## 2016-06-08 DIAGNOSIS — F32A Depression, unspecified: Secondary | ICD-10-CM

## 2016-06-08 LAB — CBC WITH DIFFERENTIAL/PLATELET
BASOS PCT: 0 %
Basophils Absolute: 0 cells/uL (ref 0–200)
EOS ABS: 150 {cells}/uL (ref 15–500)
Eosinophils Relative: 3 %
HCT: 42.1 % (ref 38.5–50.0)
Hemoglobin: 13.9 g/dL (ref 13.2–17.1)
LYMPHS ABS: 1400 {cells}/uL (ref 850–3900)
LYMPHS PCT: 28 %
MCH: 30.8 pg (ref 27.0–33.0)
MCHC: 33 g/dL (ref 32.0–36.0)
MCV: 93.3 fL (ref 80.0–100.0)
MONO ABS: 500 {cells}/uL (ref 200–950)
MPV: 10.2 fL (ref 7.5–12.5)
Monocytes Relative: 10 %
NEUTROS ABS: 2950 {cells}/uL (ref 1500–7800)
Neutrophils Relative %: 59 %
PLATELETS: 234 10*3/uL (ref 140–400)
RBC: 4.51 MIL/uL (ref 4.20–5.80)
RDW: 13 % (ref 11.0–15.0)
WBC: 5 10*3/uL (ref 4.0–10.5)

## 2016-06-08 LAB — VITAMIN B12: Vitamin B-12: 571 pg/mL (ref 200–1100)

## 2016-06-08 LAB — TSH: TSH: 1.78 m[IU]/L (ref 0.40–4.50)

## 2016-06-08 LAB — PSA: PSA: 1.4 ng/mL (ref <=4.0)

## 2016-06-08 MED ORDER — BUPROPION HCL ER (SR) 150 MG PO TB12: 150.0000 mg | ORAL_TABLET | Freq: Every day | ORAL | 1 refills | Status: DC

## 2016-06-08 NOTE — Progress Notes (Signed)
   Subjective:    Patient ID: [name], male    DOB: Dec 09, 1945, 70 y.o.   MRN: [id number]  HPI He is here for consultation concerning multiple issues. He is doing reasonably well on his Trintellix but thinks that he is not totally back to normal. He and his wife have both noted difficulty with his memory. He dates this back to 10 months ago however note is in the record from January when he saw Dr.. At that time he was diagnosed with mild cognitive impairment as well as essential tremor and hyperreflexia. I did add this to the chronic problem list. He continues to complain of short-term memory loss as well as occasionally having difficulty with word searching. He states that the tremor is actually increased. He gets routine follow-up with Dr. Alinda Money concerning his prostate cancer. He is in an active surveillance mode concerning this. He also complains of difficulty with bilateral hip pain. He is apparently had injections in the both hips which lasted a very short period of time. He states that he has not had x-rays on it.   Review of Systems     Objective:   Physical Exam Alert and in no distress with a flat affect. Exam of the hips does show some bilateral pain on motion but the range of motion is normal. There is also some pain especially with flexion of the hips. DTRs are brisk and roughly 3+. Reflexes slightly diminished. Slight tremor is noted in both hands. MMSE 30       Assessment & Plan:  Bilateral hip pain - Plan: DG HIP UNILAT WITH PELVIS 2-3 VIEWS LEFT, DG HIP UNILAT WITH PELVIS 2-3 VIEWS RIGHT  Prostate cancer ([place]) - Plan: PSA  Hyperlipidemia, unspecified hyperlipidemia type  Depression, unspecified depression type - Plan: buPROPion (WELLBUTRIN SR) 150 MG 12 hr tablet, CBC with Differential/Platelet, Comprehensive metabolic panel, Vitamin [id number]  Hyperreflexia - Plan: TSH, Vitamin B12, Ambulatory referral to Neurology  Mild cognitive impairment - Plan: CBC with  Differential/Platelet, Comprehensive metabolic panel, TSH, Vitamin B12, Ambulatory referral to Neurology  Essential tremor - Plan: TSH, Vitamin B12, Ambulatory referral to Neurology His MMSE is quite good. I will add Wellbutrin to see if this will help with his depression. He will also be scheduled to see Dr. Carles Collet and I encouraged him to get involved in counseling at [facility] psychology. He is to return here in roughly 1 month for a recheck. Over 45 minutes, greater than 50% spent in counseling and coordination of care.

## 2016-06-09 LAB — COMPREHENSIVE METABOLIC PANEL
ALBUMIN: 4.6 g/dL (ref 3.6–5.1)
ALT: 21 U/L (ref 9–46)
AST: 23 U/L (ref 10–35)
Alkaline Phosphatase: 53 U/L (ref 40–115)
BUN: 17 mg/dL (ref 7–25)
CHLORIDE: 106 mmol/L (ref 98–110)
CO2: 25 mmol/L (ref 20–31)
CREATININE: 0.84 mg/dL (ref 0.70–1.25)
Calcium: 9.6 mg/dL (ref 8.6–10.3)
Glucose, Bld: 87 mg/dL (ref 65–99)
Potassium: 4.2 mmol/L (ref 3.5–5.3)
SODIUM: 140 mmol/L (ref 135–146)
Total Bilirubin: 0.5 mg/dL (ref 0.2–1.2)
Total Protein: 6.8 g/dL (ref 6.1–8.1)

## 2016-06-27 NOTE — Progress Notes (Signed)
[name] was seen today in the movement disorders clinic for neurologic consultation at the request of [name], MD.  Pt is accompanied by his wife who supplements the history.   The consultation is for the evaluation of tremor.  The records that were made available to me were reviewed.  Tremor was first noted in the medical record in 07/2012 and it was documented as R hand tremor that had been going on for one year.  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   Wife states that it has gotten progressively worse with time.  Pt reports that it is both hands, always has been both hands but he is L hand dominant.  Pt states that he notices tremor most when doing fine motor coordination (trying to string guitar last night).  There is no rest tremor.  There is no tremor in the legs.  No family hx of tremor.    07/03/16 update:  Pt f/u today, accompanied by his wife who supplements the history.  He has a hx of tremor.  This has gotten worse over the last 2 years.  He is not too bothered by tremor but wife is.  He does get frustrated by it.   I have not seen him since 08/2014.  The records that were made available to me were reviewed.  He was concerned last visit about memory loss and that seems to still be a concern.  His PCP thought related to depression and started on wellbutrin.  Pt states that it is all "trivial" stuff.  He had trouble remembering obama's first name.  He has trouble remembering if he did a routine task.  He has had a couple of instances where the bills didn't get paid despite he had a check list.  He doesn't forget to take his meds.  He fills his own pill box but his wife admits that she checks after him to make sure that he does that right.  He drives and has no trouble in the daylight per pt and wife but having trouble at night, mostly because he is having trouble seeing at night.    ALLERGIES:  No Known  Allergies  CURRENT MEDICATIONS:  Outpatient Encounter Prescriptions as of 07/03/2016  Medication Sig  . aspirin 81 MG tablet Take 81 mg by mouth daily.  Marland Kitchen buPROPion (WELLBUTRIN SR) 150 MG 12 hr tablet Take 1 tablet (150 mg total) by mouth daily.  . cholecalciferol (VITAMIN D) 1000 UNITS tablet Take 2,000 Units by mouth daily. TAKES 2000 UNITS DAILY  . CRESTOR 20 MG tablet TAKE 1 TABLET BY MOUTH DAILY  . dutasteride (AVODART) 0.5 MG capsule Take 1 capsule (0.5 mg total) by mouth daily.  . Ibuprofen-Diphenhydramine Cit (ADVIL PM PO) Take by mouth.  . Multiple Vitamins-Minerals (MULTIVITAMIN WITH MINERALS) tablet Take 1 tablet by mouth daily.    . polyethylene glycol powder (GLYCOLAX/MIRALAX) powder Take 17 g by mouth daily. (Patient taking differently: Take 17 g by mouth daily. PRN)  . terazosin (HYTRIN) 2 MG capsule Take 1 capsule (2 mg total) by mouth daily.  Marland Kitchen vortioxetine HBr (TRINTELLIX) 20 MG TABS Take 20 mg by mouth daily.  . [DISCONTINUED] TRINTELLIX 20 MG TABS TAKE 1 TABLET BY MOUTH DAILY   No facility-administered encounter medications on file as of 07/03/2016.     PAST MEDICAL HISTORY:   Past Medical History:  Diagnosis Date  . Allergy   .  Arthritis    osteoarthritis  . Cancer [facility])    testicular, age 50, orchiectomy 63  . Cataract    B/L CATARACTS NO SURGERY  . Depression   . Dyslipidemia   . Heartburn    TAKES ZANTAC  . Hypertension   . Hypogonadism male   . Prostate cancer ([place]) 06/07/11   gleason 3+3=6, vol 59.5 cc  . Sleep apnea   . Status post chemotherapy    testicular cancer 1992, Dr Beryle Beams    PAST SURGICAL HISTORY:   Past Surgical History:  Procedure Laterality Date  . APPENDECTOMY    . COLONOSCOPY  2007   gessner  . INGUINAL HERNIA REPAIR     w/orchiectomy 1992, retroperitoneal lymph node dissection  . MENISECTOMY     R&L knee surgeries  . PROSTATE SURGERY     biopsy x 2    SOCIAL HISTORY:   Social History   Social History  .  Marital status: Married    Spouse name: N/A  . Number of children: 0  . Years of education: N/A   Occupational History  . [employer]   Social History Main Topics  . Smoking status: Former Smoker    Packs/day: 1.00    Years: 20.00    Types: Cigarettes    Quit date: 08/03/1999  . Smokeless tobacco: Never Used  . Alcohol use 3.6 oz/week    6 Cans of beer per week     Comment: DAILY BEERS 2-3 12 OZ  . Drug use: No  . Sexual activity: Yes   Other Topics Concern  . Not on file   Social History Narrative  . No narrative on file    FAMILY HISTORY:   Family Status  Relation Status  . Mother Deceased   lung cancer  . Father Deceased   lung cancer  . Brother Alive   unknown    ROS:  Admits to some neck pain.  A complete 10 system review of systems was obtained and was unremarkable apart from what is mentioned above.  PHYSICAL EXAMINATION:    VITALS:   Vitals:   07/03/16 0842  BP: 126/84  Pulse: 84  SpO2: 95%  Weight: 160 lb 9 oz (72.8 kg)  Height: 5\' 9"  (1.753 m)    GEN:  The patient appears stated age and is in NAD. HEENT:  Normocephalic, atraumatic.  The mucous membranes are moist. The superficial temporal arteries are without ropiness or tenderness. CV:  RRR Lungs:  CTAB Neck/HEME:  There are no carotid bruits bilaterally.  Neurological examination:  Orientation:  Montreal Cognitive Assessment  07/03/2016 09/03/2014  Visuospatial/ Executive (0/5) 3 5  Naming (0/3) 3 3  Attention: Read list of digits (0/2) 2 2  Attention: Read list of letters (0/1) 1 1  Attention: Serial 7 subtraction starting at 100 (0/3) 3 3  Language: Repeat phrase (0/2) 2 2  Language : Fluency (0/1) 1 1  Abstraction (0/2) 2 2  Delayed Recall (0/5) 0 2  Orientation (0/6) 6 6  Total 23 27  Adjusted Score (based on education) 23 -   Cranial nerves: There is good facial symmetry.  There is no facial hypomimia.  Pupils are equal round and reactive to light  bilaterally. Fundoscopic exam reveals clear margins bilaterally. Extraocular muscles are intact. The visual fields are full to confrontational testing. The speech is fluent and clear. Soft palate rises symmetrically and there is no tongue deviation. Hearing is intact to conversational tone. Sensation: Sensation is  intact to light and pinprick throughout (facial, trunk, extremities).  He does report slight decreased pinprick in the right arm compared to the left, but otherwise pinprick was symmetric.  Vibration is intact at the bilateral big toe. There is no extinction with double simultaneous stimulation. There is no sensory dermatomal level identified. Motor: Strength is 5/5 in the bilateral upper and lower extremities.   Shoulder shrug is equal and symmetric.  There is no pronator drift. Deep tendon reflexes: Deep tendon reflexes are 2+/4 at the bilateral biceps, triceps, brachioradialis, 3/4 at the bilateral patella with cross adductor reflexes present and 1/4 at the bilateral achilles. Plantar responses are downgoing bilaterally. Frontal release:  There is a bilateral palmomental and bilateral glabellar tap sign.  Movement examination: Tone: There is normal tone in the bilateral upper extremities.  The tone in the lower extremities is normal.  Abnormal movements: There is mild tremor of the outstretched hands that increases with intention.  It doesn't increase when given a weight.   He has minimal difficulty with Archimedes spirals.  He has mild difficulty pouring water from one full glass to another. Coordination:  There is no significant decremation with RAM's, with any form of rapid alternating movements, including alternating supination and pronation of the forearm, hand opening and closing, finger taps, heel taps and toe taps. Gait and Station: The patient has no difficulty arising out of a deep-seated chair without the use of the hands. The patient's stride length is normal.  The patient has  trouble ambulating in a tandem fashion.  He is able to stand on heels/toes and in the romberg position.    Labs:  Lab Results  Component Value Date   [id number] 06/08/2016     Chemistry      Component Value Date/Time   NA 140 06/08/2016 1425   K 4.2 06/08/2016 1425   CL 106 06/08/2016 1425   CO2 25 06/08/2016 1425   BUN 17 06/08/2016 1425   CREATININE 0.84 06/08/2016 1425      Component Value Date/Time   CALCIUM 9.6 06/08/2016 1425   ALKPHOS 53 06/08/2016 1425   AST 23 06/08/2016 1425   ALT 21 06/08/2016 1425   BILITOT 0.5 06/08/2016 1425     Lab Results  Component Value Date   TSH 1.78 06/08/2016     ASSESSMENT/PLAN:  1.  Essential tremor  -Talked about the fact that I see no evidence of a neurodegenerative process such as Parkinson's disease today.  Wife more bothered by the tremor than the patient.  Would hold on treatment.   2.  Hyperreflexia  -Although he certainly may have physiologic hyperreflexia, we talked about potentially doing an MRI of the brain.  Denies neck pain today, so will just do MRI brain.  [facility]   3.  Cognitive impairment  -May have developed underlying dementia since last visit in addition to the depression.  Last visit he had just changed antidepressants so we decided to hold on neuropsych testing.  The same is true again this visit, but ultimately I told him that perhaps neuropsych testing will give [place] both some definitive answers.  I believe that psychiatry and counseling could be of value. 4.  F/u pending results of the above.  Much greater than 50% of this visit was spent in counseling and coordinating care.  Total face to face time:  35 min

## 2016-06-28 ENCOUNTER — Other Ambulatory Visit: Payer: Self-pay

## 2016-06-28 ENCOUNTER — Telehealth: Payer: Self-pay

## 2016-06-28 MED ORDER — VORTIOXETINE HBR 20 MG PO TABS: 20.0000 mg | ORAL_TABLET | Freq: Every day | ORAL | 2 refills | Status: DC

## 2016-06-28 NOTE — Telephone Encounter (Signed)
Refill request rcvd for trintellix to [facility]. Victorino December

## 2016-07-03 ENCOUNTER — Encounter: Payer: Self-pay | Admitting: Neurology

## 2016-07-03 ENCOUNTER — Ambulatory Visit (INDEPENDENT_AMBULATORY_CARE_PROVIDER_SITE_OTHER): Payer: Medicare Other | Admitting: Neurology

## 2016-07-03 VITALS — BP 126/84 | HR 84 | Ht 69.0 in | Wt 160.6 lb

## 2016-07-03 DIAGNOSIS — R292 Abnormal reflex: Secondary | ICD-10-CM | POA: Diagnosis not present

## 2016-07-03 DIAGNOSIS — G3184 Mild cognitive impairment, so stated: Secondary | ICD-10-CM | POA: Diagnosis not present

## 2016-07-03 DIAGNOSIS — G250 Essential tremor: Secondary | ICD-10-CM

## 2016-07-03 NOTE — Patient Instructions (Addendum)
You have been referred for a neurocognitive evaluation in our office.   The evaluation consists of three appointments.   1. The first appointment is about 45 minutes and is a clinical interview with the neuropsychologist (Dr. Macarthur Critchley). Please bring someone with you to this appointment if possible, as it is helpful for Dr. Si Raider to hear from both you and another adult who knows you well.   2. The second appointment is 2-3 hours long and is with the psychometrician Milana Kidney). You will complete a variety of tasks- mostly question-and-answer, some paper-and-pencil. There is nothing you need to do to prepare for this appointment, but having a good night's sleep prior to the testing, and bringing eyeglasses and hearing aids (if you wear them), is advised.   3. The final appointment is a follow-up with Dr. Si Raider where she will go over the test results with you and provide recommendations and a plan of care. This appointment is about 30 minutes.  If you would like a family member to receive this information as well, please bring them to the appointment.   We have to reserve several hours of the neuropsychologist's time and the psychometrician's time for your appointment. As such, please note that there is a No-Show fee of $100. If you are unable to attend any of your appointments, please contact our office as soon as possible to reschedule.

## 2016-07-12 ENCOUNTER — Encounter: Payer: Self-pay | Admitting: Family Medicine

## 2016-07-12 ENCOUNTER — Ambulatory Visit (INDEPENDENT_AMBULATORY_CARE_PROVIDER_SITE_OTHER): Payer: Medicare Other | Admitting: Family Medicine

## 2016-07-12 DIAGNOSIS — F32A Depression, unspecified: Secondary | ICD-10-CM

## 2016-07-12 DIAGNOSIS — F329 Major depressive disorder, single episode, unspecified: Secondary | ICD-10-CM

## 2016-07-12 MED ORDER — BUPROPION HCL ER (SR) 150 MG PO TB12: 150.0000 mg | ORAL_TABLET | Freq: Two times a day (BID) | ORAL | 2 refills | Status: DC

## 2016-07-12 NOTE — Progress Notes (Signed)
   Subjective:    Patient ID: [name], male    DOB: 05/06/1946, 70 y.o.   MRN: [id number]  HPI He is here for recheck. Since last being seen he was also seen by neurology. He is in the process of getting an MRI. He is now on 150 mg of Wellbutrin as well as Trintellix. He states that he is only roughly 15% better.   Review of Systems     Objective:   Physical Exam alert and in no distress with appropriate affect      Assessment & Plan:  Depression, unspecified depression type - Plan: buPROPion (WELLBUTRIN SR) 150 MG 12 hr tablet I will increase him to twice a day dosing. Cautioned about possible sleep disturbance. They're to call me in one month and let me know.

## 2016-07-17 ENCOUNTER — Ambulatory Visit (INDEPENDENT_AMBULATORY_CARE_PROVIDER_SITE_OTHER): Payer: Medicare Other | Admitting: Psychology

## 2016-07-17 ENCOUNTER — Encounter: Payer: Self-pay | Admitting: Psychology

## 2016-07-17 DIAGNOSIS — G250 Essential tremor: Secondary | ICD-10-CM

## 2016-07-17 DIAGNOSIS — R413 Other amnesia: Secondary | ICD-10-CM

## 2016-07-17 NOTE — Progress Notes (Signed)
NEUROPSYCHOLOGICAL INTERVIEW (CPT: [id number])  Name: [name] Date of Birth: 1945/10/08 Date of Interview: 07/17/2016  Reason for Referral:  [name] is a 70 y.o., left-handed male who is referred for neuropsychological evaluation by [name] of [facility] due to concerns about memory loss. This patient is accompanied in the office by his wife who supplements the history.  History of Presenting Problem:  [name] has seen [name] for neurologic consultation of tremor. He last saw [name] on 07/03/2016, and he reported memory decline at that visit. He scored 23/30 on the [place], down from 27/30 on 09/03/2014. Brain MRI was ordered but has not yet been completed.  [name] and his wife reported gradual onset and progressive worsening of memory difficulty over the past year. The patient reported that he is bothered by forgetfulness and increased difficulty retrieving information.   Upon direct questioning, the patient and his wife also reported the following:   Forgetting recent conversations/events: Yes Repeating statements/questions: No Misplacing/losing items: No, just forgetting where things are in the house that have always been in the same place Forgetting appointments or other obligations: No, Wife puts on calendar Forgetting to take medications: No  Difficulty concentrating: Yes Starting but not finishing tasks: Possibly Distracted easily: Yes Processing information more slowly: Yes  Word-finding difficulty: No Writing difficulty: No Spelling difficulty: No Comprehension difficulty: Yes per wife   Getting lost when driving: No Making wrong turns when driving: No Uncertain about directions when driving or passenger: Occasionally His wife says there has been a general deterioration in his driving ability due to lack of focus when driving. He has not had any MVAs.   Family history is reportedly significant for memory loss in his father in his later years.  His mother lived to 21yo and had no memory issues.   Current Functioning: [name] continues to manage all instrumental ADLs independently, including driving, medications, finances, appointments and some cooking. He admitted that he has forgotten to pay some bills. His wife noted that he had more difficulty cooking a chili last week.  The patient is retired. He enjoys playing guitar and keyboard/piano. He also plays video games on the Xbox.  Physically, he complains of chronic joint pain and reduced energy. He has essential tremor, and his wife reported that he demonstrates significant frustration related to his tremor. He denied any difficulty with walking or balance. He has not had any falls.   With regard to mood, he and his wife reported increased irritability. His wife stated that he is "never in a good mood" recently. He reported chronic depression and anxiety characterized by worrying and perfectionism throughout his life. He has taken antidepressants, and he did do some counseling many years ago. He denies any recent counseling, stating "I don't play well with others". He is currently taking Trintellix and he was recently started on Wellbutrin. He denied any current situational stressors. He reported sleep difficulty characterized by difficulty falling and staying asleep. He has sleep apnea but could not tolerate CPAP. His appetite is reduced and he is losing weight. He does not do any regular exercise. The patient denied suicidal ideation or intention.    Psychiatric History: History of depression, anxiety, other MH disorder: Meds. Did counseling many years ago - mid 29s-  but "I don't play well with others".  History of substance dependence/treatment: No. Wife reminds him hx of heavier drinking - not alcoholic but "in training for that" - not sure why  he suddenly cut to 2 beers a day - a few years ago.   Social History: Born/Raised: Born in [place], lived there til 70yo, moved to  [place], then to [place] where graduated from [employer]: [employer] - bachelor's degree Occupational history: Worked for [employer] for 33 years  Marital history: Married x 16 years. One prior marriage, divorced. No children. Alcohol/Tobacco/Substances: 2 beers/day. Previously drank heavily. Cut back his drinking 3-4 years ago. Former smoker. No illicit substance use.   Medical History: Past Medical History:  Diagnosis Date  . Allergy   . Arthritis    osteoarthritis  . Cancer [name])    testicular, age 52, orchiectomy 46  . Cataract    B/L CATARACTS NO SURGERY  . Depression   . Dyslipidemia   . Heartburn    TAKES ZANTAC  . Hypertension   . Hypogonadism male   . Prostate cancer ([place]) 06/07/11   gleason 3+3=6, vol 59.5 cc  . Sleep apnea   . Status post chemotherapy    testicular cancer 1992, Dr Beryle Mathis     Current Medications:  Outpatient Encounter Prescriptions as of 07/17/2016  Medication Sig  . aspirin 81 MG tablet Take 81 mg by mouth daily.  Marland Kitchen buPROPion (WELLBUTRIN SR) 150 MG 12 hr tablet Take 1 tablet (150 mg total) by mouth 2 (two) times daily.  . cholecalciferol (VITAMIN D) 1000 UNITS tablet Take 2,000 Units by mouth daily. TAKES 2000 UNITS DAILY  . CRESTOR 20 MG tablet TAKE 1 TABLET BY MOUTH DAILY  . dutasteride (AVODART) 0.5 MG capsule Take 1 capsule (0.5 mg total) by mouth daily.  . Ibuprofen-Diphenhydramine Cit (ADVIL PM PO) Take by mouth.  . Multiple Vitamins-Minerals (MULTIVITAMIN WITH MINERALS) tablet Take 1 tablet by mouth daily.    . polyethylene glycol powder (GLYCOLAX/MIRALAX) powder Take 17 g by mouth daily. (Patient taking differently: Take 17 g by mouth daily. PRN)  . terazosin (HYTRIN) 2 MG capsule Take 1 capsule (2 mg total) by mouth daily.  Marland Kitchen vortioxetine HBr (TRINTELLIX) 20 MG TABS Take 20 mg by mouth daily.   No facility-administered encounter medications on file as of 07/17/2016.      Behavioral Observations:   Appearance:  Neatly and appropriately dressed and groomed Gait: Ambulated independently, no abnormalities observed Speech: Fluent; normal rate, rhythm and volume Thought process: Linear, goal directed Affect: Mildly restricted, sarcastic/self-depricating humor Interpersonal: Pleasant, appropriate   TESTING: There is medical necessity to proceed with neuropsychological assessment as the results will be used to aid in differential diagnosis and clinical decision-making and to inform specific treatment recommendations. Per the patient, his wife and medical records reviewed, there has been a change in cognitive functioning and a reasonable suspicion of dementia.   PLAN: The patient will return for a full battery of neuropsychological testing with a psychometrician under my supervision. Education regarding testing procedures was provided. Subsequently, the patient will see this provider for a follow-up session at which time his test performances and my impressions and treatment recommendations will be reviewed in detail.   Full neuropsychological evaluation report to follow.

## 2016-07-25 ENCOUNTER — Encounter: Payer: Self-pay | Admitting: Family Medicine

## 2016-07-26 ENCOUNTER — Ambulatory Visit (INDEPENDENT_AMBULATORY_CARE_PROVIDER_SITE_OTHER): Payer: Medicare Other | Admitting: Psychology

## 2016-07-26 DIAGNOSIS — R413 Other amnesia: Secondary | ICD-10-CM | POA: Diagnosis not present

## 2016-07-26 NOTE — Progress Notes (Signed)
   Neuropsychology Note  [name] returned today for 2 hours of neuropsychological testing with technician, Milana Kidney, BS, under the supervision of Dr. Macarthur Critchley. The patient did not appear overtly distressed by the testing session, per behavioral observation or via self-report to the technician. Rest breaks were offered. [name] will return within 2 weeks for a feedback session with Dr. Si Raider at which time his test performances, clinical impressions and treatment recommendations will be reviewed in detail. The patient understands he can contact our office should he require our assistance before this time.  Full report to follow.

## 2016-07-27 ENCOUNTER — Telehealth: Payer: Self-pay | Admitting: Neurology

## 2016-07-27 NOTE — Telephone Encounter (Signed)
Reviewed MRI brain without dated 07/18/16.  Essentially normal.  Rare t2 hyperintensity.  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  You can let pt know that MRI looks fine.

## 2016-08-01 NOTE — Telephone Encounter (Signed)
Left message on machine for patient to call back.

## 2016-08-01 NOTE — Telephone Encounter (Signed)
Patient made aware.

## 2016-08-02 NOTE — Progress Notes (Signed)
NEUROPSYCHOLOGICAL EVALUATION   Name:    [name]  Date of Birth:   07-Apr-1946 Date of Interview:  07/17/2016 Date of Testing:  07/26/2016   Date of Feedback:  08/03/2016       Background Information:  Reason for Referral:  [name] is a 70 y.o., left-handed male referred by Dr. Wells Guiles Tat to assess his current level of cognitive functioning and assist in differential diagnosis. The current evaluation consisted of a review of available medical records, an interview with the patient and his wife, and the completion of a neuropsychological testing battery. Informed consent was obtained.  History of Presenting Problem:  [name] last saw Dr. Carles Collet for neurologic consultation of tremor on 07/03/2016, and he reported memory decline at that visit. He scored 23/30 on the [place], down from 27/30 on 09/03/2014. Brain MRI was completed on 07/18/16, and per Dr. 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    Mr. Buisson and his wife reported gradual onset and progressive worsening of memory difficulty over the past year. The patient reported that he is bothered by forgetfulness and increased difficulty retrieving information.   Upon direct questioning, the patient and his wife also reported the following:   Forgetting recent conversations/events: Yes Repeating statements/questions: No Misplacing/losing items: No, just forgetting where things are in the house that have always been in the same place Forgetting appointments or other obligations: No, Wife puts on calendar Forgetting to take medications: No  Difficulty concentrating: Yes Starting but not finishing tasks: Possibly Distracted easily: Yes Processing information more slowly: Yes  Word-finding difficulty: No Writing difficulty: No Spelling difficulty: No Comprehension difficulty: Yes per wife   Getting lost when driving: No Making wrong  turns when driving: No Uncertain about directions when driving or passenger: Occasionally His wife says there has been a general deterioration in his driving ability due to lack of focus when driving. He has not had any MVAs.   Family history is reportedly significant for memory loss in his father in his later years. His mother lived to 92yo and had no memory issues.   Current Functioning: [name] continues to manage all instrumental ADLs independently, including driving, medications, finances, appointments and some cooking. He admitted that he has forgotten to pay some bills. His wife noted that he had more difficulty cooking a chili last week.  The patient is retired. He enjoys playing guitar and keyboard/piano. He also plays video games on the Xbox.  Physically, he complains of chronic joint pain and reduced energy. He has essential tremor, and his wife reported that he demonstrates significant frustration related to his tremor. He denied any difficulty with walking or balance. He has not had any falls.   With regard to mood, he and his wife reported increased irritability. His wife stated that he is "never in a good mood" recently. He reported chronic depression and anxiety characterized by worrying and perfectionism throughout his life. He has taken antidepressants, and he did do some counseling many years ago. He denies any recent counseling, stating "I don't play well with others". He is currently taking Trintellix and he was recently started on Wellbutrin. He denied any current situational stressors. He reported sleep difficulty characterized by difficulty falling and staying asleep. He has sleep apnea but could not tolerate CPAP. His appetite is reduced and he is losing weight. He does not do any regular exercise. The patient denied suicidal  ideation or intention.    Psychiatric History: History of depression, anxiety, other MH disorder: Meds. Did counseling many years ago - in  the 1970s-  but "I don't play well with others".  History of substance dependence/treatment: No. Wife reminds him of his history of heavier drinking - he states he was not an alcoholic but "in training for that" - not sure why he suddenly cut to 2 beers a day - he did this a few years ago.   Social History: Born/Raised: Born in [place], lived there til 70yo, moved to [place], then to [place] where graduated from [employer]: [employer] - bachelor's degree Occupational history: Worked for [employer] for 33 years  Marital history: Married x 16 years. One prior marriage, divorced. No children. Alcohol/Tobacco/Substances: 2 beers/day. Previously drank heavily. Cut back his drinking 3-4 years ago. Former smoker. No illicit substance use.   Medical History:  Past Medical History:  Diagnosis Date  . Allergy   . Arthritis    osteoarthritis  . Cancer [facility])    testicular, age 73, orchiectomy 47  . Cataract    B/L CATARACTS NO SURGERY  . Depression   . Dyslipidemia   . Heartburn    TAKES ZANTAC  . Hypertension   . Hypogonadism male   . Prostate cancer ([place]) 06/07/11   gleason 3+3=6, vol 59.5 cc  . Sleep apnea   . Status post chemotherapy    testicular cancer 1992, Dr Beryle Beams    Current medications:  Outpatient Encounter Prescriptions as of 08/03/2016  Medication Sig  . aspirin 81 MG tablet Take 81 mg by mouth daily.  Marland Kitchen buPROPion (WELLBUTRIN SR) 150 MG 12 hr tablet Take 1 tablet (150 mg total) by mouth 2 (two) times daily.  . cholecalciferol (VITAMIN D) 1000 UNITS tablet Take 2,000 Units by mouth daily. TAKES 2000 UNITS DAILY  . CRESTOR 20 MG tablet TAKE 1 TABLET BY MOUTH DAILY  . dutasteride (AVODART) 0.5 MG capsule Take 1 capsule (0.5 mg total) by mouth daily.  . Ibuprofen-Diphenhydramine Cit (ADVIL PM PO) Take by mouth.  . Multiple Vitamins-Minerals (MULTIVITAMIN WITH MINERALS) tablet Take 1 tablet by mouth daily.    . polyethylene glycol powder  (GLYCOLAX/MIRALAX) powder Take 17 g by mouth daily. (Patient taking differently: Take 17 g by mouth daily. PRN)  . terazosin (HYTRIN) 2 MG capsule Take 1 capsule (2 mg total) by mouth daily.  Marland Kitchen vortioxetine HBr (TRINTELLIX) 20 MG TABS Take 20 mg by mouth daily.   No facility-administered encounter medications on file as of 08/03/2016.      Current Examination:  Behavioral Observations: Appearance: Neatly and appropriately dressed and groomed Gait: Ambulated independently, no abnormalities observed Speech: Fluent; normal rate, rhythm and volume Thought process: Linear, goal directed Affect: Mildly restricted, sarcastic/self-depricating humor Interpersonal: Pleasant, appropriate Orientation: Oriented to all spheres. 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  Tests Administered: . Test of Premorbid Functioning (TOPF) . Wechsler Adult Intelligence Scale-Fourth Edition (WAIS-IV): Similarities, Music therapist,  Symbol Search, and Digit Span subtests . [employer] Verbal Learning Test - 2nd Edition (CVLT-2) Short Form . Repeatable Battery for the Assessment of Neuropsychological Status (RBANS) Form A:  Figure Copy and Recall, Story Memory and Recall and Semantic Fluency subtests . Symbol Digit Modalities Test (SDMT) . Neuropsychological Assessment Battery (NAB) Language Module, Form 1: Naming and Bill Payment subtests . Boston Diagnostic Aphasia Examination: Complex Ideational Material subtest . Controlled Oral Word Association Test (COWAT) . Trail Making Test A and B . Clock  drawing test . [employer] [facility]) . Generalized Anxiety Disorder - 7 item screener (GAD-7) . Beck Depression Inventory - Second edition (BDI-II)  Test Results: Note: Standardized scores are presented only for use by appropriately trained professionals and to allow for any future test-retest comparison. These scores should not be interpreted without consideration of all the information that  is contained in the rest of the report. The most recent standardization samples from the test publisher or other sources were used whenever possible to derive standard scores; scores were corrected for age, gender, ethnicity and education when available.   Test Scores:  Test Name Raw Score Standardized Score Descriptor  TOPF 66/70 SS= 125 Superior  WAIS-IV Subtests     Similarities 31/36 ss= 14 Superior  Block Design 41/66 ss= 12 High average  Symbol Search 27/60 ss= 10 Average  Digit Span 37/48 ss= 16 Very superior  RBANS Subtests     Figure Copy 19/20 Z= 0.5 Average  Figure Recall 12/20 Z= -0.4 Average  Story Memory 17/24 Z= -0.4 Average  Story Recall 9/12 Z= -0.1 Average  Semantic Fluency 9 Z= -2.6 Severely impaired  CVLT-II Scores     Trial 1 6/9 Z= 0 Average  Trial 4 7/9 Z= -0.5 Average  Trials 1-4 total 27/36 T= 53 Average  SD Free Recall 2/9 Z= -2.5 Impaired  LD Free Recall 1/9 Z= -2 Impaired  LD Cued Recall 3/9 Z= -2 Impaired  Recognition Discriminability 7/9 hits, 2 false positives Z= -0.5 Average  Forced Choice Recognition 9/9  WNL  SDMT     Written 37/110 Z= -0.6 Average  Oral 45/110 Z= -0.6 Average  NAB Language subtests     Naming 31/31 T= 55 Average  Bill Payment 17/19 T= 37 Low average  BDAE Subtest     Complex Ideational Material 12/12  WNL  COWAT-FAS 54 T= 60 High average  COWAT-Animals 14 T= 40 Low average  Trail Making Test A  57" 0 errors T= 32 Borderline  Trail Making Test B  90" 0 errors T= 48  Average  Clock Drawing   Impaired  WCST     Total errors 11 T= 62 High average  Perseverative responses 6 T= 56 Average  Perseverative errors 6 T= 55 Average  Conceptual level responses 53 T= 62 High average  Categories completed 4 >16% WNL  Trials to complete 1st category 11 >16% WNL  Failure to maintain set 2    GAD-7 12/21  Moderate  BDI-II 18/63  Mild      Description of Test Results:  Premorbid verbal intellectual abilities were estimated  to have been within the superior range based on a test of word reading. Psychomotor processing speed was average. When the motor component was removed, he still performed within the average range. Auditory attention and working memory were very superior. Visual-spatial construction was high average to average. Language abilities were variable. Specifically, confrontation naming was average with 100% accuracy, while semantic verbal fluency ranged from severely impaired to low average. Auditory comprehension of complex ideational material was intact. On a simulated bill payment task (requiring multiple aspects of both expressive and receptive language), he performed in the low average range. With regard to verbal memory, encoding and acquisition of non-contextual information (i.e., word list) was average. After a brief distracter task, free recall was impaired (2/9 items recalled). After a delay, free recall was impaired (1/9 items recalled). Cued recall was impaired (3/9 items recalled). Performance on a yes/no recognition task was  average. On another verbal memory test, encoding and acquisition of contextual auditory information (i.e., short story) was average. After a delay, free recall was average. With regard to non-verbal memory, delayed free recall of visual information was average. Executive functioning was intact overall. Mental flexibility and set-shifting were average on Trails B. Verbal fluency with phonemic search restrictions was high average. Verbal abstract reasoning was superior. Deductive reasoning and problem-solving were average to high average. Performance on a clock drawing task was within normal limits. On self-report questionnaires, the patient's responses were  indicative of mild but clinically significant depression, and moderate generalized anxiety, at the present time. He endorsed the following indicators of depression: sadness, pessimism about the future, feelings of failure, anhedonia,  loss of self confidence, tearfulness, restlessness, decreased interest, indecisiveness, feelings of worthlessness, loss of energy, reduced sleep, irritability, reduced appetite, decreased concentration, fatigue and reduced libido. He denied suicidal ideation or intention. Indictors of generalized anxiety included: significant worrying, inability to control worrying, nervousness, difficulty relaxing, irritability, and fear of something awful happening.   Clinical Impressions: Mild cognitive impairment (recall of non-contextual information, semantic verbal fluency). Mild depression and anxiety. Results of the current cognitive evaluation are largely within normal limits, with most areas of function normal for his age and consistent with estimated premorbid intellectual abilities. However, there are a few areas of mild impairment including recall of non-contextual information (i.e.,he is able to initially encode information but after even a brief delay it is difficult for him to retrieve the information, if it was not presented within an organized or contextual format originally, such as a list of unrelated items) and semantic verbal fluency (while phonemic verbal fluency remains intact). His testing results do not warrant a diagnosis of dementia; however, a diagnosis mild cognitive impairment is appropriate at this time. I am concerned this may represent prodromal Alzheimer's disease based on the areas of impairment. However, he also has untreated sleep apnea which can contribute to cognitive decline. Finally, he does present with chronic and ongoing depression and generalized anxiety, but I do not believe these factors are solely responsible for his cognitive impairment although they do likely exacerbate underlying cognitive dysfunction.  Recommendations: Based on the findings of the present evaluation, the following recommendations are offered:  1. Treatment of sleep apnea is highly recommended as this may  improve cognitive functioning.  2. Based on his test performances, the patient will likely benefit from memory retrieval aids (e.g., lists, calendar, daily log).  3. Treatment for depression and anxiety is indicated. He is currently prescribed Wellbutrin and Trintellix. Wellbutrin was added more recently, so it may need more time to reach full effect. He is not interested in counseling at this time. 4. Neuropsychological re-assessment in one year is recommended in order to monitor cognitive status, track any progression of symptoms and further assist with treatment planning.  Feedback to Patient: JERELLE SLESINSKI and his wife returned for a feedback appointment on 08/03/2016 to review the results of his neuropsychological evaluation with this provider. 25 minutes face-to-face time was spent reviewing his test results, my impressions and my recommendations as detailed above.    Total time spent on this patient's case: [id number] unit for interview with psychologist; [phone] units of testing by psychometrician under psychologist's supervision; [phone] units for medical record review, scoring of neuropsychological tests, interpretation of test results, preparation of this report, and review of results to the patient by psychologist.      Thank you for your referral of MICKAL BUSSEN.  Please feel free to contact me if you have any questions or concerns regarding this report.

## 2016-08-03 ENCOUNTER — Encounter: Payer: Self-pay | Admitting: Psychology

## 2016-08-03 ENCOUNTER — Ambulatory Visit (INDEPENDENT_AMBULATORY_CARE_PROVIDER_SITE_OTHER): Payer: Medicare Other | Admitting: Psychology

## 2016-08-03 DIAGNOSIS — F320 Major depressive disorder, single episode, mild: Secondary | ICD-10-CM

## 2016-08-03 DIAGNOSIS — R413 Other amnesia: Secondary | ICD-10-CM

## 2016-08-03 DIAGNOSIS — G3184 Mild cognitive impairment, so stated: Secondary | ICD-10-CM

## 2016-08-05 ENCOUNTER — Encounter: Payer: Self-pay | Admitting: Psychology

## 2016-08-08 ENCOUNTER — Other Ambulatory Visit: Payer: Self-pay | Admitting: Family Medicine

## 2016-08-08 DIAGNOSIS — E785 Hyperlipidemia, unspecified: Secondary | ICD-10-CM

## 2016-08-11 ENCOUNTER — Other Ambulatory Visit: Payer: Self-pay | Admitting: Family Medicine

## 2016-08-11 DIAGNOSIS — N400 Enlarged prostate without lower urinary tract symptoms: Secondary | ICD-10-CM

## 2016-08-11 DIAGNOSIS — C61 Malignant neoplasm of prostate: Secondary | ICD-10-CM

## 2016-09-11 ENCOUNTER — Ambulatory Visit (INDEPENDENT_AMBULATORY_CARE_PROVIDER_SITE_OTHER): Payer: Medicare Other | Admitting: Family Medicine

## 2016-09-11 ENCOUNTER — Encounter: Payer: Self-pay | Admitting: Family Medicine

## 2016-09-11 VITALS — BP 114/88 | HR 86 | Ht 69.0 in | Wt 155.0 lb

## 2016-09-11 DIAGNOSIS — I10 Essential (primary) hypertension: Secondary | ICD-10-CM

## 2016-09-11 DIAGNOSIS — M1990 Unspecified osteoarthritis, unspecified site: Secondary | ICD-10-CM | POA: Diagnosis not present

## 2016-09-11 DIAGNOSIS — G4733 Obstructive sleep apnea (adult) (pediatric): Secondary | ICD-10-CM | POA: Diagnosis not present

## 2016-09-11 DIAGNOSIS — G3184 Mild cognitive impairment, so stated: Secondary | ICD-10-CM | POA: Diagnosis not present

## 2016-09-11 DIAGNOSIS — Z1211 Encounter for screening for malignant neoplasm of colon: Secondary | ICD-10-CM

## 2016-09-11 DIAGNOSIS — C61 Malignant neoplasm of prostate: Secondary | ICD-10-CM | POA: Diagnosis not present

## 2016-09-11 DIAGNOSIS — F02818 Dementia in other diseases classified elsewhere, unspecified severity, with other behavioral disturbance: Secondary | ICD-10-CM | POA: Insufficient documentation

## 2016-09-11 DIAGNOSIS — F32A Depression, unspecified: Secondary | ICD-10-CM

## 2016-09-11 DIAGNOSIS — Z8249 Family history of ischemic heart disease and other diseases of the circulatory system: Secondary | ICD-10-CM | POA: Diagnosis not present

## 2016-09-11 DIAGNOSIS — G250 Essential tremor: Secondary | ICD-10-CM | POA: Diagnosis not present

## 2016-09-11 DIAGNOSIS — E785 Hyperlipidemia, unspecified: Secondary | ICD-10-CM

## 2016-09-11 DIAGNOSIS — G309 Alzheimer's disease, unspecified: Secondary | ICD-10-CM | POA: Insufficient documentation

## 2016-09-11 DIAGNOSIS — F329 Major depressive disorder, single episode, unspecified: Secondary | ICD-10-CM | POA: Diagnosis not present

## 2016-09-11 DIAGNOSIS — F0280 Dementia in other diseases classified elsewhere without behavioral disturbance: Secondary | ICD-10-CM | POA: Insufficient documentation

## 2016-09-11 LAB — LIPID PANEL
CHOL/HDL RATIO: 2.5 ratio (ref <5.0)
Cholesterol: 140 mg/dL (ref <200)
HDL: 55 mg/dL (ref >40)
LDL CALC: 67 mg/dL (ref <100)
Triglycerides: 90 mg/dL (ref <150)
VLDL: 18 mg/dL (ref <30)

## 2016-09-11 NOTE — Progress Notes (Signed)
Subjective:   HPI  [name] is a 71 y.o. male who presents for a complete physical.  Medical care team includes:  Dr.Tat  Dr.Borden   Preventative care: Last ophthalmology visit: 01/2016 Last dental visit: 2 weeks ago Last colonoscopy:08/17/06 Last prostate exam: 2 weeks ago  Last EKG: 03/25/15 Last labs:06/08/16  Prior vaccinations: TD or Tdap:07/25/12 Influenza:05/18/16 Pneumococcal: 13: 08/31/15   23; 12/09/09 Shingles/Zostavax: 12/08/08   Advanced directive:Yes. I asked for a copy.  Concerns: He does complain with having difficulty with constipation for at least the last several months. He has had no change in his food or activity level. He has been using MiraLAX but not on a regular basis. He did stop taking the Wellbutrin thinking it was causing difficulty with constipation however when he decrease the dosing, it did not make much of a difference. He also has underlying OSA but is difficulty with the appliances. Review of the record indicates it's time for another colonoscopy and I will set him up for Cologuard. He is followed regularly by urology for his underlying prostate cancer. He also is being followed for essential tremor as well as mild cognitive impairment by neurology. Does have underlying hypertension as well as arthritis but having very little difficulty with these. He continues on his psychotropic medications. His home life seems stable. His wife is here with him.   Reviewed their medical, surgical, family, social, medication, and allergy history and updated chart as appropriate.    Review of Systems Negative except as above    Objective:   Physical Exam   General appearance: alert, no distress, WD/WN,  Skin:Normal HEENT: normocephalic, conjunctiva/corneas normal, sclerae anicteric, PERRLA, EOMi, nares patent, no discharge or erythema, pharynx normal Oral cavity: MMM, tongue normal, teeth normal Neck: supple, no lymphadenopathy, no thyromegaly, no masses,  normal ROM Chest: non tender, normal shape and expansion Heart: RRR, normal S1, S2, no murmurs Lungs: CTA bilaterally, no wheezes, rhonchi, or rales Abdomen: +bs, soft, non tender, non distended, no masses, no hepatomegaly, no splenomegaly, no bruits  Musculoskeletal: upper extremities non tender, no obvious deformity, normal ROM throughout, lower extremities non tender, no obvious deformity, normal ROM throughout Extremities: no edema, no cyanosis, no clubbing Pulses: 2+ symmetric, upper and lower extremities, normal cap refill Neurological: alert, oriented x 3, CN2-12 intact, strength normal upper extremities and lower extremities, sensation normal throughout, DTRs 2+ throughout, no cerebellar signs, gait normal Psychiatric: Lightly flat affect, behavior normal, pleasant    Assessment and Plan :   Screening for colon cancer - Plan: Cologuard  Depression, unspecified depression type  Mild cognitive impairment  Prostate cancer (HCC)  Hyperlipidemia, unspecified hyperlipidemia type - Plan: Lipid panel  Essential tremor  Essential hypertension  Arthritis  OSA (obstructive sleep apnea)  Family history of heart disease in male family member before age 81 Cologuard as mentioned above. I've asked him to start back on his regular dosing of Wellbutrin as well as Trintellix. He will keep me informed as to how he is doing there. He will continue on his other medications. Commend he call Dr. Cindie Laroche to discuss possible dental appliance for his underlying OSA.  He will continue to be followed by urology as well as neurology. Physical exam - discussed healthy lifestyle, diet, exercise, preventative care, vaccinations, and addressed their concerns.        Follow-up

## 2016-09-11 NOTE — Patient Instructions (Addendum)
For constipation, increase fluids, bulk and your diet, exercise and listen to your body Check with Dr. Augustina Mood concerning dental appliance for that

## 2016-09-24 ENCOUNTER — Other Ambulatory Visit: Payer: Self-pay | Admitting: Family Medicine

## 2016-10-06 ENCOUNTER — Encounter: Payer: Self-pay | Admitting: Family Medicine

## 2016-10-17 ENCOUNTER — Encounter: Payer: Self-pay | Admitting: Family Medicine

## 2016-10-25 ENCOUNTER — Telehealth: Payer: Self-pay | Admitting: Family Medicine

## 2016-10-25 NOTE — Telephone Encounter (Signed)
Pt wife called and wants to get Joss set up with having the coloquard, states you can him talked about this at his last office visit done she can be reached at [phone]

## 2016-10-25 NOTE — Telephone Encounter (Signed)
Called pt to make sure that his ins. Would pay he said no need to medicare will pay so I have sent out referral

## 2016-10-31 LAB — COLOGUARD: COLOGUARD: NEGATIVE

## 2016-11-11 ENCOUNTER — Other Ambulatory Visit: Payer: Self-pay | Admitting: Family Medicine

## 2016-11-11 DIAGNOSIS — I10 Essential (primary) hypertension: Secondary | ICD-10-CM

## 2016-11-15 ENCOUNTER — Encounter: Payer: Self-pay | Admitting: Family Medicine

## 2017-01-15 ENCOUNTER — Encounter: Payer: Self-pay | Admitting: Family Medicine

## 2017-01-15 ENCOUNTER — Ambulatory Visit (INDEPENDENT_AMBULATORY_CARE_PROVIDER_SITE_OTHER): Payer: Medicare Other | Admitting: Family Medicine

## 2017-01-15 VITALS — BP 118/70 | HR 80 | Wt 154.0 lb

## 2017-01-15 DIAGNOSIS — K5900 Constipation, unspecified: Secondary | ICD-10-CM

## 2017-01-15 LAB — HEMOCCULT GUIAC POC 1CARD (OFFICE)

## 2017-01-15 NOTE — Progress Notes (Addendum)
   Subjective:    Patient ID: [name], male    DOB: 1945/10/10, 71 y.o.   MRN: [id number]  HPI He is here for evaluation of constipation. He has been using MiraLAX for the last several months however in the last month he has noted difficulty with decreased stools a proximally 1 week ago he had the urge to have a BM but had no success. He then used fleets enema again without much success. He then switched to Metamucil and has had continued difficulty. He has had a Cologuard test done within the last several months and was negative. He's had no changes in his exercise pattern or medications.   Review of Systems     Objective:   Physical Exam Alert and in no distress. Abdominal exam shows no masses with normal bowel sounds. Rectal exam does show firm stool in the vault and was guaiac negative      Assessment & Plan:  Constipation, unspecified constipation type Discussed proper care for constipation including fluids, bulk in diet, exercise. I will have him try the fleets enema again and have him leave it in the low longer this time as he only left it in for 15 minutes. If no results then he is to switch to milk of magnesia and possibly also use cascara. Information given concerning constipation.

## 2017-01-15 NOTE — Patient Instructions (Signed)
Constipation, Adult Constipation is when a person has fewer bowel movements in a week than normal, has difficulty having a bowel movement, or has stools that are dry, hard, or larger than normal. Constipation may be caused by an underlying condition. It may become worse with age if a person takes certain medicines and does not take in enough fluids. Follow these instructions at home: Eating and drinking   Eat foods that have a lot of fiber, such as fresh fruits and vegetables, whole grains, and beans.  Limit foods that are high in fat, low in fiber, or overly processed, such as french fries, hamburgers, cookies, candies, and soda.  Drink enough fluid to keep your urine clear or pale yellow. General instructions  Exercise regularly or as told by your health care provider.  Go to the restroom when you have the urge to go. Do not hold it in.  Take over-the-counter and prescription medicines only as told by your health care provider. These include any fiber supplements.  Practice pelvic floor retraining exercises, such as deep breathing while relaxing the lower abdomen and pelvic floor relaxation during bowel movements.  Watch your condition for any changes.  Keep all follow-up visits as told by your health care provider. This is important. Contact a health care provider if:  You have pain that gets worse.  You have a fever.  You do not have a bowel movement after 4 days.  You vomit.  You are not hungry.  You lose weight.  You are bleeding from the anus.  You have thin, pencil-like stools. Get help right away if:  You have a fever and your symptoms suddenly get worse.  You leak stool or have blood in your stool.  Your abdomen is bloated.  You have severe pain in your abdomen.  You feel dizzy or you faint. This information is not intended to replace advice given to you by your health care provider. Make sure you discuss any questions you have with your health care  provider. Document Released: 04/21/2004 Document Revised: 02/11/2016 Document Reviewed: 01/12/2016 Elsevier Interactive Patient Education  2017 [employer]. Try the milk of magnesia and if that doesn't work then do milk of magnesia and cascara the next day

## 2017-01-15 NOTE — Addendum Note (Signed)
Addended by: Denita Lung on: 01/15/2017 10:10 AM   Modules accepted: Orders

## 2017-01-22 ENCOUNTER — Other Ambulatory Visit: Payer: Self-pay | Admitting: Family Medicine

## 2017-01-22 NOTE — Telephone Encounter (Signed)
Is this okay to refill? 

## 2017-01-27 ENCOUNTER — Other Ambulatory Visit: Payer: Self-pay | Admitting: Family Medicine

## 2017-01-27 DIAGNOSIS — F32A Depression, unspecified: Secondary | ICD-10-CM

## 2017-01-27 DIAGNOSIS — F329 Major depressive disorder, single episode, unspecified: Secondary | ICD-10-CM

## 2017-01-29 NOTE — Telephone Encounter (Signed)
Can this pt have a refill on this ? 

## 2017-03-25 ENCOUNTER — Other Ambulatory Visit: Payer: Self-pay | Admitting: Family Medicine

## 2017-03-25 DIAGNOSIS — F329 Major depressive disorder, single episode, unspecified: Secondary | ICD-10-CM

## 2017-03-25 DIAGNOSIS — F32A Depression, unspecified: Secondary | ICD-10-CM

## 2017-03-26 NOTE — Telephone Encounter (Signed)
pls advise on buproprion refill. Victorino December

## 2017-04-22 ENCOUNTER — Other Ambulatory Visit: Payer: Self-pay | Admitting: Family Medicine

## 2017-05-12 ENCOUNTER — Other Ambulatory Visit: Payer: Self-pay | Admitting: Family Medicine

## 2017-05-12 DIAGNOSIS — I10 Essential (primary) hypertension: Secondary | ICD-10-CM

## 2017-05-24 ENCOUNTER — Other Ambulatory Visit: Payer: Self-pay | Admitting: Family Medicine

## 2017-05-24 DIAGNOSIS — F32A Depression, unspecified: Secondary | ICD-10-CM

## 2017-05-24 DIAGNOSIS — F329 Major depressive disorder, single episode, unspecified: Secondary | ICD-10-CM

## 2017-06-19 ENCOUNTER — Other Ambulatory Visit: Payer: Self-pay | Admitting: Family Medicine

## 2017-07-21 ENCOUNTER — Other Ambulatory Visit: Payer: Self-pay | Admitting: Family Medicine

## 2017-07-21 DIAGNOSIS — F32A Depression, unspecified: Secondary | ICD-10-CM

## 2017-07-21 DIAGNOSIS — F329 Major depressive disorder, single episode, unspecified: Secondary | ICD-10-CM

## 2017-07-23 NOTE — Telephone Encounter (Signed)
Are these okay to refill? 

## 2017-07-23 NOTE — Telephone Encounter (Signed)
He needs a follow-up appointment

## 2017-07-25 NOTE — Telephone Encounter (Signed)
Pt scheduled CPE for 09/17/17

## 2017-08-04 ENCOUNTER — Other Ambulatory Visit: Payer: Self-pay | Admitting: Family Medicine

## 2017-08-04 DIAGNOSIS — I10 Essential (primary) hypertension: Secondary | ICD-10-CM

## 2017-08-04 DIAGNOSIS — N400 Enlarged prostate without lower urinary tract symptoms: Secondary | ICD-10-CM

## 2017-08-04 DIAGNOSIS — C61 Malignant neoplasm of prostate: Secondary | ICD-10-CM

## 2017-08-19 ENCOUNTER — Other Ambulatory Visit: Payer: Self-pay | Admitting: Family Medicine

## 2017-08-20 MED ORDER — VORTIOXETINE HBR 20 MG PO TABS: 20.0000 mg | ORAL_TABLET | Freq: Every day | ORAL | 1 refills | Status: DC

## 2017-08-20 NOTE — Telephone Encounter (Signed)
Can pt have refill on med

## 2017-08-20 NOTE — Addendum Note (Signed)
Addended by: Denita Lung on: 08/20/2017 01:37 PM   Modules accepted: Orders

## 2017-08-20 NOTE — Telephone Encounter (Signed)
yours

## 2017-09-17 ENCOUNTER — Ambulatory Visit (INDEPENDENT_AMBULATORY_CARE_PROVIDER_SITE_OTHER): Payer: Medicare Other | Admitting: Family Medicine

## 2017-09-17 ENCOUNTER — Encounter: Payer: Self-pay | Admitting: Family Medicine

## 2017-09-17 ENCOUNTER — Other Ambulatory Visit: Payer: Self-pay | Admitting: Family Medicine

## 2017-09-17 VITALS — BP 138/90 | HR 86 | Ht 66.0 in | Wt 147.6 lb

## 2017-09-17 DIAGNOSIS — Z0000 Encounter for general adult medical examination without abnormal findings: Secondary | ICD-10-CM

## 2017-09-17 DIAGNOSIS — F32A Depression, unspecified: Secondary | ICD-10-CM

## 2017-09-17 DIAGNOSIS — I10 Essential (primary) hypertension: Secondary | ICD-10-CM

## 2017-09-17 DIAGNOSIS — F329 Major depressive disorder, single episode, unspecified: Secondary | ICD-10-CM

## 2017-09-17 DIAGNOSIS — G250 Essential tremor: Secondary | ICD-10-CM

## 2017-09-17 DIAGNOSIS — E785 Hyperlipidemia, unspecified: Secondary | ICD-10-CM

## 2017-09-17 DIAGNOSIS — Z8249 Family history of ischemic heart disease and other diseases of the circulatory system: Secondary | ICD-10-CM | POA: Diagnosis not present

## 2017-09-17 DIAGNOSIS — R12 Heartburn: Secondary | ICD-10-CM | POA: Diagnosis not present

## 2017-09-17 DIAGNOSIS — G3184 Mild cognitive impairment, so stated: Secondary | ICD-10-CM | POA: Diagnosis not present

## 2017-09-17 DIAGNOSIS — G4733 Obstructive sleep apnea (adult) (pediatric): Secondary | ICD-10-CM | POA: Diagnosis not present

## 2017-09-17 DIAGNOSIS — Z8546 Personal history of malignant neoplasm of prostate: Secondary | ICD-10-CM | POA: Diagnosis not present

## 2017-09-17 LAB — POCT URINALYSIS DIP (PROADVANTAGE DEVICE)
BILIRUBIN UA: NEGATIVE
Glucose, UA: NEGATIVE mg/dL
Ketones, POC UA: NEGATIVE mg/dL
LEUKOCYTES UA: NEGATIVE
NITRITE UA: NEGATIVE
PH UA: 6 (ref 5.0–8.0)
Protein Ur, POC: NEGATIVE mg/dL
RBC UA: NEGATIVE
Specific Gravity, Urine: 1.025
UUROB: 3.5

## 2017-09-17 NOTE — Patient Instructions (Signed)
  Mr. [name] , Thank you for taking time to come for your Medicare Wellness Visit. I appreciate your ongoing commitment to your health goals. Please review the following plan we discussed and let me know if I can assist you in the future.   These are the goals we discussed: Goals    None      This is a list of the screening recommended for you and due dates:  Health Maintenance  Topic Date Due  . Colon Cancer Screening  08/17/2016  . Pneumonia vaccines (2 of 2 - PPSV23) 08/30/2016  . Tetanus Vaccine  07/25/2022  . Flu Shot  Completed  .  Hepatitis C: One time screening is recommended by Center for Disease Control  (CDC) for  adults born from [birth year] through 1963.   Completed

## 2017-09-17 NOTE — Progress Notes (Signed)
[name] is a 72 y.o. male who presents for annual wellness visit and follow-up on chronic medical conditions.  He has the following concerns: He has noted increased difficulty with tremors especially in his hands.  He also recognizes further cognitive impairment mainly with memory issues.  He was seen by neurology over a year ago and review of the record indicates that they wanted to see him in follow-up 1 year later.  He is now past that date.  Apparently that was not made clear when they were seen.  He does have a history of OSA and is supposed to be on a CPAP however he did not tolerate the CPAP.  He continues on his Trintellix and Wellbutrin.  There is a question about the dosing of the Wellbutrin.  He also complains of a lump in the throat mainly on the right-hand side that he notices when he swallows but no associated vomiting, diarrhea, nausea or chest or abdominal discomfort.  Liquids tend to cause some difficulty.  He has a previous history of prostate cancer but at this time is greater than 10 years since diagnosis.  He also has a history of hypogonadism and of course is not on his testosterone at this time.  He continues on his Avodart.  He is also taking Hytrin as well as Crestor.  No troubles with any of these medications.   Immunizations and Health Maintenance Immunization History  Administered Date(s) Administered  . Influenza Split 04/18/2012, 05/06/2013, 05/27/2015  . Influenza Whole 04/12/2011  . Influenza-Unspecified 04/23/2014, 05/18/2016, 05/22/2017  . Pneumococcal Conjugate-13 08/31/2015  . Pneumococcal Polysaccharide-23 12/09/2009  . Tdap 01/17/2000, 07/25/2012  . Zoster 12/08/2008   Health Maintenance Due  Topic Date Due  . COLONOSCOPY  08/17/2016  . PNA vac Low Risk Adult (2 of 2 - PPSV23) 08/30/2016    Last colonoscopy: year ago Last PSA: last year OCT 1.77 Dentist: nov last year Ophtho: last year june Exercise: once a week   Other doctors caring for patient  include:Tat  Advanced Directives: Not discussed    Depression screen:  See questionnaire below.     Depression screen [facility] 2/9 09/17/2017 09/11/2016 08/31/2015 08/24/2014 07/25/2012  Decreased Interest 2 3 3 3 3   Down, Depressed, Hopeless 2 1 3 3 3   PHQ - 2 Score 4 4 6 6 6   Altered sleeping 0 - 2 - 1  Tired, decreased energy 1 - 2 - 3  Change in appetite 2 - 3 - 1  Feeling bad or failure about yourself  1 - 0 - 0  Trouble concentrating 2 - 3 - 2  Moving slowly or fidgety/restless 0 - 0 - 0  Suicidal thoughts 0 - 0 - 0  PHQ-9 Score 10 - 16 - 13  Difficult doing work/chores Somewhat difficult - Somewhat difficult - -    Fall Screen: See Questionaire below.   Fall Risk  09/17/2017 09/11/2016 07/03/2016 08/31/2015 08/24/2014  Falls in the past year? No No No No No    ADL screen:  See questionnaire below.  Functional Status Survey: Is the patient deaf or have difficulty hearing?: Yes Does the patient have difficulty seeing, even when wearing glasses/contacts?: No Does the patient have difficulty concentrating, remembering, or making decisions?: Yes Does the patient have difficulty walking or climbing stairs?: No Does the patient have difficulty dressing or bathing?: No Does the patient have difficulty doing errands alone such as visiting a doctor's office or shopping?: No   Review of Systems  Constitutional: -, -unexpected weight change, -anorexia, -fatigue Allergy: -sneezing, -itching, -congestion Dermatology: denies changing moles, rash, lumps ENT: -runny nose, -ear pain, -sore throat,  Cardiology:  -chest pain, -palpitations, -orthopnea, Respiratory: -cough, -shortness of breath, -dyspnea on exertion, -wheezing,  Gastroenterology: -abdominal pain, -nausea, -vomiting, -diarrhea, -constipation, -dysphagia Hematology: -bleeding or bruising problems Musculoskeletal: -arthralgias, -myalgias, -joint swelling, -back pain, - Ophthalmology: -vision changes,  Urology: -dysuria, -difficulty  urinating,  -urinary frequency, -urgency, incontinence Neurology: -, -numbness, , -memory loss, -falls, -dizziness    PHYSICAL EXAM:  Affect is slightly flat General Appearance: Alert, cooperative, no distress, appears stated age. Head: Normocephalic, without obvious abnormality, atraumatic Eyes: PERRL, conjunctiva/corneas clear, EOM's intact, fundi benign Ears: Normal TM's and external ear canals Nose: Nares normal, mucosa normal, no drainage or sinus   tenderness Throat: Lips, mucosa, and tongue normal; teeth and gums normal Neck: Supple, no lymphadenopathy, thyroid:no enlargement/tenderness/nodules; no carotid bruit or JVD Lungs: Clear to auscultation bilaterally without wheezes, rales or ronchi; respirations unlabored Heart: Regular rate and rhythm, S1 and S2 normal, no murmur, rub or gallop Abdomen: Soft, non-tender, nondistended, normoactive bowel sounds, no masses, no hepatosplenomegaly Extremities: No clubbing, cyanosis or edema Pulses: 2+ and symmetric all extremities Skin: Skin color, texture, turgor normal, no rashes or lesions Lymph nodes: Cervical, supraclavicular, and axillary nodes normal Neurologic: CNII-XII intact, normal strength, sensation and gait; reflexes 2+ and symmetric throughout   Psych: Normal mood, affect, hygiene and grooming  ASSESSMENT/PLAN: Depression, unspecified depression type  Essential tremor  Family history of heart disease in male family member before age 15 - Plan: CBC with Differential/Platelet, Comprehensive metabolic panel, Lipid panel  History of prostate cancer  Mild cognitive impairment - Plan: CBC with Differential/Platelet, Comprehensive metabolic panel, Lipid panel  OSA (obstructive sleep apnea)  Heartburn  Hyperlipidemia, unspecified hyperlipidemia type - Plan: Lipid panel Might have ordered wound care still married with acute He is to take to Wellbutrin in the morning rather than spreading it out during the day tonight  interfere with sleep.  He will also check with his CPAP provider.  I want to send him back there to get proper fitting to eliminate his OSA as a factor in his cognitive impairment.  We will also refer him back to neurology to discuss possible medications for both the tremor and the cognitive impairment.  Explained that we need to be careful concerning on the medications that he presently is on.  Also recommend getting the Shingrix vaccine.  Discussed the swallowing difficulty he is having and he is comfortable with waiting to get some of these other issues squared away.  Medicare Attestation I have personally reviewed: The patient's medical and social history Their use of alcohol, tobacco or illicit drugs Their current medications and supplements The patient's functional ability including ADLs,fall risks, home safety risks, cognitive, and hearing and visual impairment Diet and physical activities Evidence for depression or mood disorders  The patient's weight, height, and BMI have been recorded in the chart.  I have made referrals, counseling, and provided education to the patient based on review of the above and I have provided the patient with a written personalized care plan for preventive services.     Jill Alexanders, MD   09/17/2017

## 2017-09-17 NOTE — Telephone Encounter (Signed)
Can I refill wellbutrin. Please advise Thanks [facility]

## 2017-09-17 NOTE — Addendum Note (Signed)
Addended by: Elyse Jarvis on: 09/17/2017 04:05 PM   Modules accepted: Orders

## 2017-09-18 LAB — COMPREHENSIVE METABOLIC PANEL
A/G RATIO: 2.3 — AB (ref 1.2–2.2)
ALT: 26 IU/L (ref 0–44)
AST: 23 IU/L (ref 0–40)
Albumin: 4.8 g/dL (ref 3.6–4.8)
Alkaline Phosphatase: 51 IU/L (ref 39–117)
BILIRUBIN TOTAL: 0.8 mg/dL (ref 0.0–1.2)
BUN / CREAT RATIO: 20 (ref 10–24)
BUN: 16 mg/dL (ref 8–27)
CALCIUM: 9.8 mg/dL (ref 8.6–10.2)
CHLORIDE: 104 mmol/L (ref 96–106)
CO2: 23 mmol/L (ref 20–29)
Creatinine, Ser: 0.79 mg/dL (ref 0.76–1.27)
GFR, EST AFRICAN AMERICAN: 106 mL/min/{1.73_m2} (ref >59)
GFR, EST NON AFRICAN AMERICAN: 92 mL/min/{1.73_m2} (ref >59)
Globulin, Total: 2.1 g/dL (ref 1.5–4.5)
Glucose: 88 mg/dL (ref 65–99)
POTASSIUM: 4.6 mmol/L (ref 3.5–5.2)
SODIUM: 142 mmol/L (ref 134–144)
TOTAL PROTEIN: 6.9 g/dL (ref 6.0–8.5)

## 2017-09-18 LAB — LIPID PANEL
CHOL/HDL RATIO: 2.3 ratio (ref 0.0–5.0)
Cholesterol, Total: 146 mg/dL (ref 100–199)
HDL: 63 mg/dL (ref >39)
LDL Calculated: 70 mg/dL (ref 0–99)
Triglycerides: 63 mg/dL (ref 0–149)
VLDL Cholesterol Cal: 13 mg/dL (ref 5–40)

## 2017-09-18 LAB — CBC WITH DIFFERENTIAL/PLATELET
BASOS: 1 %
Basophils Absolute: 0 10*3/uL (ref 0.0–0.2)
EOS (ABSOLUTE): 0.1 10*3/uL (ref 0.0–0.4)
EOS: 3 %
HEMATOCRIT: 43.1 % (ref 37.5–51.0)
Hemoglobin: 14.4 g/dL (ref 13.0–17.7)
IMMATURE GRANS (ABS): 0 10*3/uL (ref 0.0–0.1)
Immature Granulocytes: 0 %
LYMPHS: 26 %
Lymphocytes Absolute: 1.2 10*3/uL (ref 0.7–3.1)
MCH: 31.1 pg (ref 26.6–33.0)
MCHC: 33.4 g/dL (ref 31.5–35.7)
MCV: 93 fL (ref 79–97)
MONOS ABS: 0.5 10*3/uL (ref 0.1–0.9)
Monocytes: 10 %
NEUTROS ABS: 2.8 10*3/uL (ref 1.4–7.0)
Neutrophils: 60 %
PLATELETS: 212 10*3/uL (ref 150–379)
RBC: 4.63 x10E6/uL (ref 4.14–5.80)
RDW: 13.3 % (ref 12.3–15.4)
WBC: 4.6 10*3/uL (ref 3.4–10.8)

## 2017-09-18 MED ORDER — ROSUVASTATIN CALCIUM 20 MG PO TABS: 20.0000 mg | ORAL_TABLET | Freq: Every day | ORAL | 3 refills | Status: DC

## 2017-09-18 MED ORDER — TERAZOSIN HCL 2 MG PO CAPS: ORAL_CAPSULE | ORAL | 3 refills | Status: DC

## 2017-09-18 NOTE — Addendum Note (Signed)
Addended by: Denita Lung on: 09/18/2017 08:21 AM   Modules accepted: Orders

## 2017-10-24 ENCOUNTER — Ambulatory Visit: Payer: Medicare Other | Admitting: Medical

## 2017-11-02 ENCOUNTER — Other Ambulatory Visit: Payer: Self-pay | Admitting: Family Medicine

## 2017-11-02 DIAGNOSIS — N400 Enlarged prostate without lower urinary tract symptoms: Secondary | ICD-10-CM

## 2017-11-02 DIAGNOSIS — C61 Malignant neoplasm of prostate: Secondary | ICD-10-CM

## 2017-11-02 NOTE — Telephone Encounter (Signed)
Please advise if dutasteride can be fill at [facility] on [address] . Thanks [employer]

## 2017-11-18 ENCOUNTER — Other Ambulatory Visit: Payer: Self-pay | Admitting: Family Medicine

## 2017-11-19 NOTE — Telephone Encounter (Signed)
Pt pharmacy is requesting to fill trintellix . [facility] on [address]. Please advise Kh

## 2017-12-07 NOTE — Progress Notes (Signed)
[name] was seen today in the movement disorders clinic for neurologic consultation at the request of Denita Lung, MD.  Pt is accompanied by his wife who supplements the history.   The consultation is for the evaluation of tremor.  The records that were made available to me were reviewed.  Tremor was first noted in the medical record in 07/2012 and it was documented as R hand tremor that had been going on for one year.  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   Wife states that it has gotten progressively worse with time.  Pt reports that it is both hands, always has been both hands but he is L hand dominant.  Pt states that he notices tremor most when doing fine motor coordination (trying to string guitar last night).  There is no rest tremor.  There is no tremor in the legs.  No family hx of tremor.    07/03/16 update:  Pt f/u today, accompanied by his wife who supplements the history.  He has a hx of tremor.  This has gotten worse over the last 2 years.  He is not too bothered by tremor but wife is.  He does get frustrated by it.   I have not seen him since 08/2014.  The records that were made available to me were reviewed.  He was concerned last visit about memory loss and that seems to still be a concern.  His PCP thought related to depression and started on wellbutrin.  Pt states that it is all "trivial" stuff.  He had trouble remembering obama's first name.  He has trouble remembering if he did a routine task.  He has had a couple of instances where the bills didn't get paid despite he had a check list.  He doesn't forget to take his meds.  He fills his own pill box but his wife admits that she checks after him to make sure that he does that right.  He drives and has no trouble in the daylight per pt and wife but having trouble at night, mostly because he is having trouble seeing at night.    12/11/17 update: Patient is seen today in  follow-up.  He is accompanied by his wife who supplements the history.  I have not seen the patient in a year and a half.  He did have neurocognitive testing in December, 2017, which demonstrated mild cognitive impairment as well as anxiety and depression.  Dr. Marcia Brash notes indicated that she was concerned that this could represent a prodromal Alzheimer's disease based on areas of impairment.  She also thought untreated sleep apnea could contribute to his cognitive decline.  The records that were made available to me were reviewed since our last visit.  His PCP did tell him in Feb he was going to send him back to the sleep doc to try and get CPAP refitted.  He reports that this didn't happened and they didn't know that they were to do this.  They cannot even find the CPAP mask.  He reports that he last saw a sleep physician 12 years ago.  Pt/wife both report memory change.  He sometimes cannot remember street names in neighborhood.  He has word finding trouble.  He doesn't remember things wife told him.  Wife does medications.  Wife states that they have set up a system for bill paying because he has had trouble  with this.  Pt does have hx of essential tremor and they report that tremor is increasing.  Tremor is in hands and feet.  MRI of the brain was completed since our last visit and personally reviewed.  This was dated July 18, 2016.  It was essentially normal.  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  ALLERGIES:  No Known Allergies  CURRENT MEDICATIONS:  Outpatient Encounter Medications as of 12/11/2017  Medication Sig  . aspirin 81 MG tablet Take 81 mg by mouth daily.  Marland Kitchen buPROPion (WELLBUTRIN SR) 150 MG 12 hr tablet TAKE 1 TABLET(150 MG) BY MOUTH TWICE DAILY  . cholecalciferol (VITAMIN D) 1000 UNITS tablet Take 2,000 Units by mouth daily. TAKES 2000 UNITS DAILY  . dutasteride (AVODART) 0.5 MG capsule TAKE 1 CAPSULE(0.5 MG)  BY MOUTH DAILY  . Ibuprofen-Diphenhydramine Cit (ADVIL PM PO) Take by mouth.  . Multiple Vitamins-Minerals (MULTIVITAMIN WITH MINERALS) tablet Take 1 tablet by mouth daily.    . polyethylene glycol powder (GLYCOLAX/MIRALAX) powder Take 17 g by mouth daily. (Patient taking differently: Take 17 g by mouth daily. PRN)  . psyllium (METAMUCIL) 58.6 % powder Take 1 packet by mouth 3 (three) times daily.  . rosuvastatin (CRESTOR) 20 MG tablet Take 1 tablet (20 mg total) by mouth daily.  Marland Kitchen terazosin (HYTRIN) 2 MG capsule TAKE 1 CAPSULE(2 MG) BY MOUTH DAILY  . TRINTELLIX 20 MG TABS tablet TAKE 1 TABLET BY MOUTH DAILY   No facility-administered encounter medications on file as of 12/11/2017.     PAST MEDICAL HISTORY:   Past Medical History:  Diagnosis Date  . Allergy   . Arthritis    osteoarthritis  . 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 Cataract    B/L CATARACTS NO SURGERY  . Depression   . Dyslipidemia   . Heartburn    TAKES ZANTAC  . Hypertension   . Hypogonadism male   . Prostate cancer ([place]) 06/07/11   gleason 3+3=6, vol 59.5 cc  . Sleep apnea   . Status post chemotherapy    testicular cancer 1992, Dr Beryle Beams    PAST SURGICAL HISTORY:   Past Surgical History:  Procedure Laterality Date  . APPENDECTOMY    . COLONOSCOPY  2007   gessner  . INGUINAL HERNIA REPAIR     w/orchiectomy 1992, retroperitoneal lymph node dissection  . MENISECTOMY     R&L knee surgeries  . PROSTATE SURGERY     biopsy x 2    SOCIAL HISTORY:   Social History   Socioeconomic History  . Marital status: Married    Spouse name: Not on file  . Number of children: 0  . Years of education: Not on file  . Highest education level: Not on file  Occupational History  . Occupation: RETIRED    Employer: [employer]  Social Needs  . Financial resource strain: Not on file  . Food insecurity:    Worry: Not on file    Inability: Not on file  . Transportation needs:     Medical: Not on file    Non-medical: Not on file  Tobacco Use  . Smoking status: Former Smoker    Packs/day: 1.00    Years: 20.00    Pack years: 20.00    Types: Cigarettes    Last attempt to quit: 08/03/1999    Years since quitting: 18.3  . Smokeless tobacco: Never Used  Substance  and Sexual Activity  . Alcohol use: Yes    Alcohol/week: 3.6 oz    Types: 6 Cans of beer per week    Comment: DAILY BEERS 2-3 12 OZ  . Drug use: No  . Sexual activity: Yes  Lifestyle  . Physical activity:    Days per week: Not on file    Minutes per session: Not on file  . Stress: Not on file  Relationships  . Social connections:    Talks on phone: Not on file    Gets together: Not on file    Attends religious service: Not on file    Active member of club or organization: Not on file    Attends meetings of clubs or organizations: Not on file    Relationship status: Not on file  . Intimate partner violence:    Fear of current or ex partner: Not on file    Emotionally abused: Not on file    Physically abused: Not on file    Forced sexual activity: Not on file  Other Topics Concern  . Not on file  Social History Narrative  . Not on file    FAMILY HISTORY:   Family Status  Relation Name Status  . Mother  Deceased       lung cancer  . Father  Deceased       lung cancer  . Brother  Alive       unknown    ROS:  Review of Systems  Constitutional: Negative.   HENT: Negative.   Eyes: Negative.   Respiratory: Negative.   Cardiovascular: Negative.   Gastrointestinal: Negative.   Genitourinary: Negative.   Musculoskeletal: Positive for neck pain (intermittent).  Skin: Negative.   Neurological: Positive for tremors.  Endo/Heme/Allergies: Negative.   Psychiatric/Behavioral: The patient is nervous/anxious (per wife).     PHYSICAL EXAMINATION:    VITALS:   Vitals:   12/11/17 1512  BP: 104/70  Pulse: 80  SpO2: 91%  Weight: 146 lb 8 oz (66.5 kg)  Height: 5\' 6"  (1.676 m)    GEN:   The patient appears stated age and is in NAD. HEENT:  Normocephalic, atraumatic.  The mucous membranes are moist. The superficial temporal arteries are without ropiness or tenderness. CV:  RRR Lungs:  CTAB Neck/HEME:  There are no carotid bruits bilaterally.  Neurological examination:  Orientation:  Montreal Cognitive Assessment  07/03/2016 09/03/2014  Visuospatial/ Executive (0/5) 3 5  Naming (0/3) 3 3  Attention: Read list of digits (0/2) 2 2  Attention: Read list of letters (0/1) 1 1  Attention: Serial 7 subtraction starting at 100 (0/3) 3 3  Language: Repeat phrase (0/2) 2 2  Language : Fluency (0/1) 1 1  Abstraction (0/2) 2 2  Delayed Recall (0/5) 0 2  Orientation (0/6) 6 6  Total 23 27  Adjusted Score (based on education) 23 -    GEN:  The patient appears stated age and is in NAD. HEENT:  Normocephalic, atraumatic.  The mucous membranes are moist. The superficial temporal arteries are without ropiness or tenderness. CV:  RRR Lungs:  CTAB Neck/HEME:  There are no carotid bruits bilaterally.  Neurological examination:  Orientation: The patient is alert and oriented x3.  He does look to his wife for finer aspects of the history. Cranial nerves: There is good facial symmetry. The speech is fluent and clear. Soft palate rises symmetrically and there is no tongue deviation. Hearing is intact to conversational tone. Sensation: Sensation is intact to  light touch throughout Motor: Strength is 5/5 in the bilateral upper and lower extremities.   Shoulder shrug is equal and symmetric.  There is no pronator drift.  Movement examination: Tone: There is normal tone in the RUE.  There is normal tone in the LUE.  There is normal tone in the RLE.  There is normal tone in the LLE.  Abnormal movements: There is no rest tremor.  There is mild intention tremor.  He is able to pour water from one glass to another, but does still some of the water because of tremor, but not a lot. Coordination:   There is no decremation with RAM's, with any form of RAMS, including alternating supination and pronation of the forearm, hand opening and closing, finger taps, heel taps and toe taps. Gait and Station: The patient has no difficulty arising out of a deep-seated chair without the use of the hands. The patient's stride length is normal.     Labs:  Lab Results  Component Value Date   VITAMINB12 571 06/08/2016     Chemistry      Component Value Date/Time   NA 142 09/17/2017 1148   K 4.6 09/17/2017 1148   CL 104 09/17/2017 1148   CO2 23 09/17/2017 1148   BUN 16 09/17/2017 1148   CREATININE 0.79 09/17/2017 1148   CREATININE 0.84 06/08/2016 1425      Component Value Date/Time   CALCIUM 9.8 09/17/2017 1148   ALKPHOS 51 09/17/2017 1148   AST 23 09/17/2017 1148   ALT 26 09/17/2017 1148   BILITOT 0.8 09/17/2017 1148     Lab Results  Component Value Date   TSH 1.78 06/08/2016     ASSESSMENT/PLAN:  1.  Essential tremor  -I still see no evidence of Parkinson's disease.  In the past, he wanted no treatment for tremor.  He thinks he has changed his mind.  Will start primidone.  Risks, benefits, side effects and alternative therapies were discussed.  The opportunity to ask questions was given and they were answered to the best of my ability.  The patient expressed understanding and willingness to follow the outlined treatment protocols.  -will recheck TSH 2.  Cognitive impairment  -Neurocognitive testing in December, 2017 did not reveal evidence of dementia, but there was mild cognitive impairment, which was concerning for a prodromal Alzheimer's disease based on areas of impairment.  -I think that anxiety and potentially marital stress may contribute.  There was some clear tension at times between pt/wife.  -recheck TSH as hasn't been done since 2017 3.  OSAS  -agree that needs f/u with sleep doc.  Looks like PCP was coordinating this.  Could affect memory. 4.  F/u will depend on above.

## 2017-12-11 ENCOUNTER — Other Ambulatory Visit: Payer: Medicare Other

## 2017-12-11 ENCOUNTER — Ambulatory Visit (INDEPENDENT_AMBULATORY_CARE_PROVIDER_SITE_OTHER): Payer: Medicare Other | Admitting: Neurology

## 2017-12-11 ENCOUNTER — Encounter: Payer: Self-pay | Admitting: Neurology

## 2017-12-11 VITALS — BP 104/70 | HR 80 | Ht 66.0 in | Wt 146.5 lb

## 2017-12-11 DIAGNOSIS — R413 Other amnesia: Secondary | ICD-10-CM

## 2017-12-11 DIAGNOSIS — G4733 Obstructive sleep apnea (adult) (pediatric): Secondary | ICD-10-CM | POA: Diagnosis not present

## 2017-12-11 DIAGNOSIS — G3184 Mild cognitive impairment, so stated: Secondary | ICD-10-CM

## 2017-12-11 DIAGNOSIS — G250 Essential tremor: Secondary | ICD-10-CM | POA: Diagnosis not present

## 2017-12-11 MED ORDER — PRIMIDONE 50 MG PO TABS: 50.0000 mg | ORAL_TABLET | Freq: Every day | ORAL | 1 refills | Status: DC

## 2017-12-11 NOTE — Patient Instructions (Addendum)
1.  You will start primidone - 50 mg - 1/2 tablet at night for 4 nights and then take one tablet at night thereafter  2.  Will recheck your thyroid today. Your provider has requested that you have labwork completed today. Please go to [facility] ([id number]) on the second floor of this building before leaving the office today. You do not need to check in. If you are not called within 15 minutes please check with the front desk.   3.  We will schedule you repeat neurocognitive testing with Dr. Si Raider

## 2017-12-12 ENCOUNTER — Telehealth: Payer: Self-pay | Admitting: Neurology

## 2017-12-12 LAB — TSH: TSH: 1.65 mIU/L (ref 0.40–4.50)

## 2017-12-12 NOTE — Telephone Encounter (Signed)
Mychart message sent to patient.

## 2017-12-12 NOTE — Telephone Encounter (Signed)
-----   Message from Rippey, DO sent at 12/12/2017  9:18 AM EDT ----- I have reviewed all lab results which are normal or stable. Please inform the patient.

## 2018-01-13 ENCOUNTER — Other Ambulatory Visit: Payer: Self-pay | Admitting: Family Medicine

## 2018-01-14 NOTE — Telephone Encounter (Signed)
[facility] is requesting to fill pt trintellix. Please advise KH 

## 2018-03-09 ENCOUNTER — Other Ambulatory Visit: Payer: Self-pay | Admitting: Family Medicine

## 2018-03-09 DIAGNOSIS — F329 Major depressive disorder, single episode, unspecified: Secondary | ICD-10-CM

## 2018-03-09 DIAGNOSIS — F32A Depression, unspecified: Secondary | ICD-10-CM

## 2018-03-11 NOTE — Telephone Encounter (Signed)
[facility] is requesting to fill pt wellbutrin. Please advise KH 

## 2018-04-03 ENCOUNTER — Other Ambulatory Visit: Payer: Medicare Other

## 2018-04-03 ENCOUNTER — Encounter: Payer: Self-pay | Admitting: Psychology

## 2018-04-03 NOTE — Progress Notes (Unsigned)
I met with the patient and his wife today to help him to find what types of counseling and resources that they needed both for the patient and for the couple.   The patient reports frustration with his mild cognitive impairment and his inability to do things or impact of tremor. There appears to be frustration from both the caregiver and the patient on how to navigate and communicate at this time.  They are in the process of building a condo so there are a lot of unknowns over long periods of time and then pressure to make decisions in a short period of time.  They are also downsizing during this move as well.  We discussed:  Maximizing things that he can control.   Developing some strategies to help keep areas that he is having challenges with.   Having a white board at the home to serve as a reminder for key items.  Sleeping well, exercise, eating well and staying engaged in finding ways to live well with his disease.  Developing some coping mechanisms as his anger and frustration builds and he can easily have anger management issues.    Resources that were provided:  Occupational therapy was recommended as this may help to find modifications to help the patient to be able to participate in some of the things that he enjoys but has found difficulty with the tremor. He likes to play the guitar and has an interest in music.  As a couple they like to go to movies. . I will ask Dr. Carles Collet is  She supports this recommendation and if so, we will get a referral processed.   Dr. Heather Roberts, Ed. who provides services to people that are going through multiple transitions either with memory/cognition and lifestyle changes would be a possible good resource for this family as she specializes in helping people and caregivers with dementia or memory issues they are navigating these changes.  Dennison Nancy as she is a Estate manager/land agent and a licensed marriage and family therapist had could help the  patient developed some coping mechanisms to deal with his frustration and anger.  She could probably help them develop some stronger communication skills while they are adjusting to the patient's health concerns.   The plan is for the patient and his wife to participate in counseling.  If the resources that I provided are not good fit for them they will contact me back so I can help them become connected with a better fit.  Time spent with the patient was over 50 minutes of counseling and education.

## 2018-04-03 NOTE — Progress Notes (Signed)
I met with the patient and his wife today to help him to find what types of counseling and resources that they needed both for the patient and for the couple.   The patient reports frustration with his mild cognitive impairment and his inability to do things or impact of tremor. There appears to be frustration from both the caregiver and the patient on how to navigate and communicate at this time.  They are in the process of building a condo so there are a lot of unknowns over long periods of time and then pressure to make decisions in a short period of time.  They are also downsizing during this move as well.  We discussed:  Maximizing things that he can control.   Developing some strategies to help keep areas that he is having challenges with.   Having a white board at the home to serve as a reminder for key items.  Sleeping well, exercise, eating well and staying engaged in finding ways to live well with his disease.  Developing some coping mechanisms as his anger and frustration builds and he can easily have anger management issues.    Resources that were provided:  Occupational therapy was recommended as this may help to find modifications to help the patient to be able to participate in some of the things that he enjoys but has found difficulty with the tremor. He likes to play the guitar and has an interest in music.  As a couple they like to go to movies. . I will ask Dr. Carles Collet is  She supports this recommendation and if so, we will get a referral processed.   Dr. Heather Roberts, Ed. who provides services to people that are going through multiple transitions either with memory/cognition and lifestyle changes would be a possible good resource for this family as she specializes in helping people and caregivers with dementia or memory issues they are navigating these changes.  Dennison Nancy as she is a Estate manager/land agent and a licensed marriage and family therapist had could help the  patient developed some coping mechanisms to deal with his frustration and anger.  She could probably help them develop some stronger communication skills while they are adjusting to the patient's health concerns.   The plan is for the patient and his wife to participate in counseling.  If the resources that I provided are not good fit for them they will contact me back so I can help them become connected with a better fit.  Time spent with the patient was over 50 minutes of counseling and education.

## 2018-05-16 ENCOUNTER — Encounter: Payer: Medicare Other | Admitting: Psychology

## 2018-05-17 ENCOUNTER — Encounter: Payer: Self-pay | Admitting: Psychology

## 2018-05-17 ENCOUNTER — Ambulatory Visit (INDEPENDENT_AMBULATORY_CARE_PROVIDER_SITE_OTHER): Payer: Medicare Other | Admitting: Psychology

## 2018-05-17 ENCOUNTER — Ambulatory Visit: Payer: Medicare Other | Admitting: Psychology

## 2018-05-17 DIAGNOSIS — R413 Other amnesia: Secondary | ICD-10-CM

## 2018-05-17 DIAGNOSIS — G3184 Mild cognitive impairment, so stated: Secondary | ICD-10-CM

## 2018-05-17 NOTE — Progress Notes (Signed)
   Neuropsychology Note  [name] completed 75 minutes of neuropsychological testing with technician, Milana Kidney, BS, under the supervision of Dr. Macarthur Critchley, Licensed Psychologist. The patient did not appear overtly distressed by the testing session, per behavioral observation or via self-report to the technician. Rest breaks were offered.   Clinical Decision Making: In considering the patient's current level of functioning, level of presumed impairment, nature of symptoms, emotional and behavioral responses during the interview, level of literacy, and observed level of motivation/effort, a battery of tests was selected and communicated to the psychometrician.  Communication between the psychologist and technician was ongoing throughout the testing session and changes were made as deemed necessary based on patient performance on testing, technician observations and additional pertinent factors such as those listed above.  [name] will return within approximately 2 weeks for an interactive feedback session with Dr. Si Raider at which time his test performances, clinical impressions and treatment recommendations will be reviewed in detail. The patient understands he can contact our office should he require our assistance before this time.  35 minutes spent performing neuropsychological evaluation services/clinical decision making (psychologist). [CPT 23536] 75 minutes spent face-to-face with patient administering standardized tests, 30 minutes spent scoring (technician). [CPT [id number], 14431]  Full report to follow.

## 2018-05-17 NOTE — Progress Notes (Signed)
NEUROBEHAVIORAL STATUS EXAM   Name: [name] Date of Birth: [birth year] Date of Interview: 05/17/2018  Reason for Referral:  [name] is a 71 y.o. male who is referred for neuropsychological re-evaluation by Dr. Wells Guiles Tat of [facility] due to concerns about worsening memory loss. This patient is accompanied in the office by his wife who supplements the history.  History of Presenting Problem [per my 07/17/2016 interview, record review and note]:  [name] last saw Dr. Carles Collet for neurologic consultation of tremor on11/27/2017, and he reported memory decline at that visit. He scored 23/30 on the [place], down from 27/30 on 09/03/2014. Brain MRI was completed on 07/18/16, and per Dr. 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   [name] and his wife reported gradual onset and progressive worsening of memory difficulty over the past year. The patient reported that he is bothered by forgetfulness and increased difficulty retrieving information.   Upon direct questioning, the patientand his wife also reported the following:   Forgetting recent conversations/events: Yes Repeating statements/questions: No Misplacing/losing items: No, just forgetting where things are in the house that have always been in the same place Forgetting appointments or other obligations: No, Wife puts on calendar Forgetting to take medications: No  Difficulty concentrating: Yes Starting but not finishing tasks: Possibly Distracted easily: Yes Processing information more slowly: Yes  Word-finding difficulty: No Writing difficulty: No Spelling difficulty: No Comprehension difficulty: Yes per wife   Getting lost when driving: No Making wrong turns when driving: No Uncertain about directions when driving or passenger: Occasionally His wife says there has been a generaldeterioration in hisdriving ability due to  lack of focus when driving. He has not had any MVAs.   Family history is reportedly significant for memory loss in his father in his later years. His mother lived to 62yo and had no memory issues.   [name] continues to manage all instrumental ADLs independently, including driving, medications, finances, appointments and some cooking. He admitted that he has forgotten to pay some bills. His wife noted that he had more difficulty cooking a chili last week.  The patient is retired. He enjoys playing guitar and keyboard/piano. He also plays video games on the Xbox.  Physically, he complains of chronic joint pain and reduced energy. He has essential tremor, and his wife reported that he demonstrates significant frustration related to his tremor. He denied any difficulty with walking or balance. He has not had any falls.   With regard to mood, he and his wife reported increased irritability. His wife stated that he is "never in a good mood" recently. He reported chronic depression and anxiety characterized by worrying and perfectionism throughout his life. He has taken antidepressants, and he did do some counseling many years ago. He denies any recent counseling, stating "I don't play well with others". He is currently taking Trintellix and he was recently started on Wellbutrin. He denied any current situational stressors. He reported sleep difficulty characterized by difficulty falling and staying asleep. He has sleep apnea but could not tolerate CPAP. His appetite is reduced and he is losing weight. He does not do any regular exercise. The patient denied suicidal ideation or intention.   Previous Neurocognitive Evaluation Results [08/03/2016]: Clinical Impressions: Mild cognitive impairment (recall of non-contextual information, semantic verbal fluency). Mild depression and anxiety. Results of the current cognitive evaluation are largely within normal limits, with most areas of function  normal  for his age and consistent with estimated premorbid intellectual abilities. However, there are a few areas of mild impairment including recall of non-contextual information (i.e.,he is able to initially encode information but after even a brief delay it is difficult for him to retrieve the information, if it was not presented within an organized or contextual format originally, such as a list of unrelated items) and semantic verbal fluency (while phonemic verbal fluency remains intact). His testing results do not warrant a diagnosis of dementia; however, a diagnosis mild cognitive impairment is appropriate at this time. I am concerned this may represent prodromal Alzheimer's disease based on the areas of impairment. However, he also has untreated sleep apnea which can contribute to cognitive decline. Finally, he does present with chronic and ongoing depression and generalized anxiety, but I do not believe these factors are solely responsible for his cognitive impairment although they do likely exacerbate underlying cognitive dysfunction.   Interim History and Current Functioning [05/17/2018]: [name] was seen for neurology follow-up by Dr. Carles Collet on 12/11/2017. He and his wife both reported memory decline and worsening tremor. Dr. Carles Collet still saw no evidence of PD. The patient was interested in trying medication for essential tremor and was started on primidone. Dr. Carles Collet felt that anxiety and potentially marital stress could be contributing to cognitive symptoms. She agreed that he needs follow up with a sleep specialist as OSA could be affecting memory, and he has not used a CPAP in years. His PCP was coordinating that. Dr. Doristine Devoid social worker, Myra Gianotti, also met with the patient and his wife on 04/03/2018 to provide resources for local marriage counseling, as requested by the patient and his wife.  At today's visit (05/17/2018), the patient and his wife report continued memory decline over the past almost 2  years since I saw them last. They also reported worsening mood (increasing anger) and his wife reported significantly reduced "impulse control".  Upon direct questioning, the patient and his wife reported the following with regard to current cognitive functioning:  Forgetting recent conversations/events: Yes Repeating statements/questions: Yes per wife (this is a change from last eval) Misplacing/losing items: Yes per wife (this is a change from last eval) Forgetting appointments or other obligations: Yes (this is a change from last eval) Forgetting to take medications: His wife has to remind him to take his AM meds now (this is a change from last eval)  Difficulty concentrating: Yes Distracted easily: Yes Processing information more slowly: Yes  Word-finding difficulty: Some, not significant Comprehension difficulty: Yes per wife  His ability to manage complex ADLs has declined significantly in the past two years. He is no longer driving. His wife reported she refused to stop riding with him due to his problems with impulse control when driving. He would pull out in front of cars, for example. He eventually agreed to stop driving. He was not necessarily having difficulty with directions or getting lost, but she felt he was unsafe to drive due to the quality of his driving and impulsivity. His wife is assisting with management of his medications (reminding him to take his AM meds and setting them out for him). She is also helping with management of the finances and notes he has more trouble with subtraction when balancing the checkbook. He cannot manage or keep track of the appointments and is constantly asking her what is coming up. He does very minimal cooking (hard boils eggs).   He reports worsening tremor. They noticed significant improvement  in tremor when he first started taking primidone but over time it seemed to become less effective and they notice no benefit from it now. He is very  frustrated by his tremor, his wife is as well. He also has had several falls in the past two years which is new for him. He seems to lose his balance or trip easily, and his wife also states falls can be due to poor judgment (doing things he shouldn't do). He fell at the feed store and landed on the left side of his head, requiring 9 stitches above his left eye. He did not lose consciousness. He has not lost consciousness in any of the falls. Most recent fall was last weekend when he got in the trash can to stomp down leaves.  He continues to report significant fatigue. He feels constantly tired. He does not feel rested upon awakening in the morning despite getting 8 hours of sleep. He still has not followed up with a sleep specialist. They cannot find his old CPAP. He does not take any daytime naps.  He reports increased anger and irritability. He agrees with his wife that he will throw "tantrums" in which he yells and throws things. He does not socialize; his wife states he enjoys being alone more now. He has less interest in things but does still enjoy a few things, including playing video games and musical instruments and getting new records and books. He denies suicidal ideation or intention. He denies hallucinations. His wife denies any other personality changes. He endorses some stress related to them getting ready to buy a townhome and sell their current home. He continues to have reduced appetite and has continued to have very gradual weight loss. He and his wife have not pursued couples therapy. His wife states he does not have interest in going so she is not going to push him to go because she does not feel it will be effective unless he is motivated to go. He agrees that he does not have interest in going.  There has been no change in alcohol intake, he still has 2 beers per day.   Social History: Born/Raised: Born in [place], lived there til 72yo, moved to [place], then to [place]  [place] where graduated from [employer] Education: [employer] - bachelor's degree Occupational history: Worked for [employer] for 33 years  Marital history: Married x 18 years. One prior marriage, divorced. No children. Alcohol/Tobacco/Substances: 2 beers/day. Previously drank heavily. Cut back his drinking 5-6 years ago.Former smoker. No illicit substance use.   Medical History: Past Medical History:  Diagnosis Date  . Allergy   . Arthritis    osteoarthritis  . 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 Cataract    B/L CATARACTS NO SURGERY  . Depression   . Dyslipidemia   . Heartburn    TAKES ZANTAC  . Hypertension   . Hypogonadism male   . Prostate cancer ([place]) 06/07/11   gleason 3+3=6, vol 59.5 cc  . Sleep apnea   . Status post chemotherapy    testicular cancer 1992, Dr Beryle Beams     Current Medications:  Outpatient Encounter Medications as of 05/17/2018  Medication Sig  . aspirin 81 MG tablet Take 81 mg by mouth daily.  Marland Kitchen buPROPion (WELLBUTRIN SR) 150 MG 12 hr tablet TAKE 1 TABLET(150 MG) BY MOUTH TWICE DAILY  . cholecalciferol (VITAMIN D) 1000 UNITS tablet Take 2,000 Units by mouth daily. TAKES 2000 UNITS DAILY  .  dutasteride (AVODART) 0.5 MG capsule TAKE 1 CAPSULE(0.5 MG) BY MOUTH DAILY  . Ibuprofen-Diphenhydramine Cit (ADVIL PM PO) Take by mouth.  . Multiple Vitamins-Minerals (MULTIVITAMIN WITH MINERALS) tablet Take 1 tablet by mouth daily.    . polyethylene glycol powder (GLYCOLAX/MIRALAX) powder Take 17 g by mouth daily. (Patient taking differently: Take 17 g by mouth daily. PRN)  . primidone (MYSOLINE) 50 MG tablet Take 1 tablet (50 mg total) by mouth at bedtime.  . psyllium (METAMUCIL) 58.6 % powder Take 1 packet by mouth 3 (three) times daily.  . rosuvastatin (CRESTOR) 20 MG tablet Take 1 tablet (20 mg total) by mouth daily.  Marland Kitchen terazosin (HYTRIN) 2 MG capsule TAKE 1 CAPSULE(2 MG) BY MOUTH DAILY  . TRINTELLIX 20 MG TABS tablet TAKE 1 TABLET BY  MOUTH DAILY   No facility-administered encounter medications on file as of 05/17/2018.      Behavioral Observations:   Appearance: Neatly and appropriately dressed and groomed. UE and LE tremor observed. Gait: Ambulated independently, no gross abnormalities observed Speech: Fluent; normal rate, rhythm and volume. No word finding difficulty. Thought process: Generally linear, goal directed Affect: Full, appears euthymic and does not demonstrate significant dysphoria or irritability during the interview. His wife demonstrates significant irritability with him. Interpersonal: Very pleasant, appropriate   40 minutes spent face-to-face with patient completing neurobehavioral status exam. 40 minutes spent integrating medical records/clinical data and completing this report. CPT [id number] unit.   TESTING: There is medical necessity to proceed with neuropsychological assessment as the results will be used to aid in differential diagnosis and clinical decision-making and to inform specific treatment recommendations. Per the patient, his wife and medical records reviewed, there has been a worsening in cognitive functioning and a reasonable suspicion of conversion of MCI to dementia.  Clinical Decision Making: In considering the patient's current level of functioning, level of presumed impairment, nature of symptoms, emotional and behavioral responses during the interview, level of literacy, and observed level of motivation, a battery of tests was selected and communicated to the psychometrician.   Following the clinical interview/neurobehavioral status exam, the patient completed this full battery of neuropsychological testing with my psychometrician under my supervision (see separate note).   PLAN: The patient will return to see me for a follow-up session at which time his test performances and my impressions and treatment recommendations will be reviewed in detail.  Evaluation ongoing; full report  to follow.

## 2018-06-07 DIAGNOSIS — F0280 Dementia in other diseases classified elsewhere without behavioral disturbance: Secondary | ICD-10-CM

## 2018-06-07 HISTORY — DX: Dementia in other diseases classified elsewhere, unspecified severity, without behavioral disturbance, psychotic disturbance, mood disturbance, and anxiety: F02.80

## 2018-06-10 ENCOUNTER — Other Ambulatory Visit: Payer: Self-pay | Admitting: Family Medicine

## 2018-06-10 DIAGNOSIS — F329 Major depressive disorder, single episode, unspecified: Secondary | ICD-10-CM

## 2018-06-10 DIAGNOSIS — F32A Depression, unspecified: Secondary | ICD-10-CM

## 2018-06-10 NOTE — Telephone Encounter (Signed)
[facility] is requesting to fill pt wellbutrin. Please advise KH 

## 2018-06-11 NOTE — Progress Notes (Signed)
[place] RE-EVALUATION   Name:    [name]  Date of Birth:   Jun 21, 1946 Date of Interview:  05/17/2018 Date of Testing:  05/17/2018   Date of Feedback:  06/13/2018       Background Information:  Reason for Referral:  [name] is a 72 y.o. male referred by Dr. Wells Guiles Tat to assess his current level of cognitive functioning and assist in differential diagnosis. The current evaluation consisted of a review of available medical records, an interview with the patient and his wife, and the completion of a neuropsychological testing battery. Informed consent was obtained.  History of Presenting Problem [per my 07/17/2016 interview, record review and note]:  [name] sawDr. Tat for neurologic consultation of tremor on11/27/2017, and he reported memory decline at that visit. He scored 23/30 on the [place], down from 27/30 on 09/03/2014. Brain MRI wascompleted on12/12/17, and per Dr. 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   [name] and his wife reported gradual onset and progressive worsening of memory difficulty over the past year. The patient reported that he is bothered by forgetfulness and increased difficulty retrieving information.   Upon direct questioning, the patientand his wife also reported the following:   Forgetting recent conversations/events: Yes Repeating statements/questions: No Misplacing/losing items: No, just forgetting where things are in the house that have always been in the same place Forgetting appointments or other obligations: No, Wife puts on calendar Forgetting to take medications: No  Difficulty concentrating: Yes Starting but not finishing tasks: Possibly Distracted easily: Yes Processing information more slowly: Yes  Word-finding difficulty: No Writing difficulty: No Spelling difficulty: No Comprehension difficulty: Yes per wife    Getting lost when driving: No Making wrong turns when driving: No Uncertain about directions when driving or passenger: Occasionally His wife says there has been a generaldeterioration in hisdriving ability due to lack of focus when driving. He has not had any MVAs.   Family history is reportedly significant for memory loss in his father in his later years. His mother lived to 42yo and had no memory issues.   [name] continues to manage all instrumental ADLs independently, including driving, medications, finances, appointments and some cooking. He admitted that he has forgotten to pay some bills. His wife noted that he had more difficulty cooking a chili last week.  The patient is retired. He enjoys playing guitar and keyboard/piano. He also plays video games on the Xbox.  Physically, he complains of chronic joint pain and reduced energy. He has essential tremor, and his wife reported that he demonstrates significant frustration related to his tremor. He denied any difficulty with walking or balance. He has not had any falls.   With regard to mood, he and his wife reported increased irritability. His wife stated that he is "never in a good mood" recently. He reported chronic depression and anxiety characterized by worrying and perfectionism throughout his life. He has taken antidepressants, and he did do some counseling many years ago. He denies any recent counseling, stating "I don't play well with others". He is currently taking Trintellix and he was recently started on Wellbutrin. He denied any current situational stressors. He reported sleep difficulty characterized by difficulty falling and staying asleep. He has sleep apnea but could not tolerate CPAP. His appetite is reduced and he is losing weight. He does not do any regular exercise. The patient denied suicidal ideation or intention.   Previous Neurocognitive Evaluation Results [  08/03/2016]: Clinical Impressions:Mild  cognitive impairment (recall of non-contextual information, semantic verbal fluency). Mild depression and anxiety. Results of the current cognitive evaluation are largely within normal limits, with most areas of functionnormal for his age andconsistent with estimated premorbid intellectual abilities. However, there are a few areas of mild impairment including recall of non-contextual information (i.e.,he is able to initially encode information but after even a brief delay it is difficult for him to retrieve the information, if it was not presented within an organized or contextual format originally, such as a list of unrelated items)and semantic verbal fluency (while phonemic verbal fluency remains intact).Histesting results do not warrant a diagnosis of dementia; however, a diagnosis mild cognitive impairment is appropriate at this time.I am concerned this may represent prodromal Alzheimer's disease based on the areas of impairment. However, he also has untreated sleep apnea which can contribute to cognitive decline. Finally, he does present with chronic and ongoing depression and generalized anxiety, but I do not believe these factors are solely responsible for his cognitive impairment although they do likely exacerbate underlying cognitive dysfunction.   Interim History and Current Functioning [05/17/2018]: [name] was seen for neurology follow-up by Dr. Carles Collet on 12/11/2017. He and his wife both reported memory decline and worsening tremor. Dr. Carles Collet still saw no evidence of PD. The patient was interested in trying medication for essential tremor and was started on primidone. Dr. Carles Collet felt that anxiety and potentially marital stress could be contributing to cognitive symptoms. She agreed that he needs follow up with a sleep specialist as OSA could be affecting memory, and he has not used a CPAP in years. His PCP was coordinating that. Dr. Doristine Devoid social worker, Myra Gianotti, also met with the patient and  his wife on 04/03/2018 to provide resources for local marriage counseling, as requested by the patient and his wife.  At today's visit (05/17/2018), the patient and his wife report continued memory decline over the past almost 2 years since I saw them last. They also reported worsening mood (increasing anger) and his wife reported significantly reduced "impulse control".  Upon direct questioning, the patient and his wife reported the following with regard to current cognitive functioning:  Forgetting recent conversations/events: Yes Repeating statements/questions: Yes per wife (this is a change from last eval) Misplacing/losing items: Yes per wife (this is a change from last eval) Forgetting appointments or other obligations: Yes (this is a change from last eval) Forgetting to take medications: His wife has to remind him to take his AM meds now (this is a change from last eval)  Difficulty concentrating: Yes Distracted easily: Yes Processing information more slowly: Yes  Word-finding difficulty: Some, not significant Comprehension difficulty: Yes per wife  His ability to manage complex ADLs has declined significantly in the past two years. He is no longer driving. His wife reported she refused to stop riding with him due to his problems with impulse control when driving. He would pull out in front of cars, for example. He eventually agreed to stop driving. He was not necessarily having difficulty with directions or getting lost, but she felt he was unsafe to drive due to the quality of his driving and impulsivity. His wife is assisting with management of his medications (reminding him to take his AM meds and setting them out for him). She is also helping with management of the finances and notes he has more trouble with subtraction when balancing the checkbook. He cannot manage or keep track of the appointments and  is constantly asking her what is coming up. He does very minimal cooking (hard  boils eggs).   He reports worsening tremor. They noticed significant improvement in tremor when he first started taking primidone but over time it seemed to become less effective and they notice no benefit from it now. He is very frustrated by his tremor, his wife is as well. He also has had several falls in the past two years which is new for him. He seems to lose his balance or trip easily, and his wife also states falls can be due to poor judgment (doing things he shouldn't do). He fell at the feed store and landed on the left side of his head, requiring 9 stitches above his left eye. He did not lose consciousness. He has not lost consciousness in any of the falls. Most recent fall was last weekend when he got in the trash can to stomp down leaves.  He continues to report significant fatigue. He feels constantly tired. He does not feel rested upon awakening in the morning despite getting 8 hours of sleep. He still has not followed up with a sleep specialist. They cannot find his old CPAP. He does not take any daytime naps.  He reports increased anger and irritability. He agrees with his wife that he will throw "tantrums" in which he yells and throws things. He does not socialize; his wife states he enjoys being alone more now. He has less interest in things but does still enjoy a few things, including playing video games and musical instruments and getting new records and books. He denies suicidal ideation or intention. He denies hallucinations. His wife denies any other personality changes. He endorses some stress related to them getting ready to buy a townhome and sell their current home. He continues to have reduced appetite and has continued to have very gradual weight loss. He and his wife have not pursued couples therapy. His wife states he does not have interest in going so she is not going to push him to go because she does not feel it will be effective unless he is motivated to go. He agrees  that he does not have interest in going.  There has been no change in alcohol intake, he still has 2 beers per day.   Social History: Born/Raised: Born in [place], lived there til 72yo, moved to [place], then to [place] where graduated from [employer] Education: [employer] - bachelor's degree Occupational history: Worked for [employer] 33 years  Marital history: Married x 18 years. One prior marriage, divorced. No children. Alcohol/Tobacco/Substances: 2 beers/day. Previously drank heavily. Cut back his drinking 5-6 years ago.Former smoker. No illicit substance use.   Medical History:  Past Medical History:  Diagnosis Date  . Allergy   . Arthritis    osteoarthritis  . 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 Cataract    B/L CATARACTS NO SURGERY  . Depression   . Dyslipidemia   . Heartburn    TAKES ZANTAC  . Hypertension   . Hypogonadism male   . Prostate cancer ([place]) 06/07/11   gleason 3+3=6, vol 59.5 cc  . Sleep apnea   . Status post chemotherapy    testicular cancer 1992, Dr Beryle Beams    Current medications:  Outpatient Encounter Medications as of 06/13/2018  Medication Sig  . aspirin 81 MG tablet Take 81 mg by mouth daily.  Marland Kitchen buPROPion (WELLBUTRIN SR) 150 MG 12 hr tablet TAKE 1  TABLET(150 MG) BY MOUTH TWICE DAILY  . cholecalciferol (VITAMIN D) 1000 UNITS tablet Take 2,000 Units by mouth daily. TAKES 2000 UNITS DAILY  . dutasteride (AVODART) 0.5 MG capsule TAKE 1 CAPSULE(0.5 MG) BY MOUTH DAILY  . Ibuprofen-Diphenhydramine Cit (ADVIL PM PO) Take by mouth.  . Multiple Vitamins-Minerals (MULTIVITAMIN WITH MINERALS) tablet Take 1 tablet by mouth daily.    . polyethylene glycol powder (GLYCOLAX/MIRALAX) powder Take 17 g by mouth daily. (Patient taking differently: Take 17 g by mouth daily. PRN)  . primidone (MYSOLINE) 50 MG tablet Take 1 tablet (50 mg total) by mouth at bedtime.  . psyllium (METAMUCIL) 58.6 % powder Take 1 packet by  mouth 3 (three) times daily.  . rosuvastatin (CRESTOR) 20 MG tablet Take 1 tablet (20 mg total) by mouth daily.  Marland Kitchen terazosin (HYTRIN) 2 MG capsule TAKE 1 CAPSULE(2 MG) BY MOUTH DAILY  . TRINTELLIX 20 MG TABS tablet TAKE 1 TABLET BY MOUTH DAILY   No facility-administered encounter medications on file as of 06/13/2018.      Current Examination:  Behavioral Observations:  Appearance: Neatly and appropriately dressed and groomed. UE and LE tremor observed. Gait: Ambulated independently, no gross abnormalities observed Speech: Fluent; normal rate, rhythm and volume. No word finding difficulty. Thought process: Generally linear, goal directed Affect: Full, appears euthymic and does not demonstrate significant dysphoria or irritability during the interview.  Interpersonal: Very pleasant, appropriate during the clinical interview. During testing, he was noted to make inappropriate comments (e.g. "little bitch", "little bastard") seemingly due to frustration with the testing (not directed toward the examiner). Orientation: Oriented to all spheres. Accurately named the current President and his predecessor.   Tests Administered: . Test of Premorbid Functioning (TOPF) . Wechsler Adult Intelligence Scale-Fourth Edition (WAIS-IV): Similarities, Music therapist, Symbol Search, and Digit Span subtests . Wechsler Memory Scale-Fourth Edition (WMS-IV) Older Adult Version (ages 57-90): Logical Memory I, II and Recognition subtests  . [employer] Verbal Learning Test - 2nd Edition (CVLT-2) Short Form . Repeatable Battery for the Assessment of Neuropsychological Status (RBANS) Form A:  Figure Copy and Recall subtests and Semantic Fluency subtest . Neuropsychological Assessment Battery (NAB) Language Module, Form 1: Bill Payment subtest . Boston Naming Test (BNT) . Boston Diagnostic Aphasia Examination: Complex Ideational Material subtest . Controlled Oral Word Association Test (COWAT) . Trail Making Test A and  B . Clock drawing test . Symbol Digit Modalities Test (SDMT) . Beck Depression Inventory - 2nd Edition (BDI-II) . Generalized Anxiety Disorder - 7 item screener (GAD-7)  Test Results: Note: Standardized scores are presented only for use by appropriately trained professionals and to allow for any future test-retest comparison. These scores should not be interpreted without consideration of all the information that is contained in the rest of the report. The most recent standardization samples from the test publisher or other sources were used whenever possible to derive standard scores; scores were corrected for age, gender, ethnicity and education when available.   Test Scores:  Test Name Raw Score Standardized Score Descriptor  TOPF 66/70 SS= 125 Superior  WAIS-IV Subtests     Similarities 33/36 ss= 15 Superior  Block Design 24/66 ss= 8 Low end of average  Symbol Search 15/60 ss= 5 Borderline  Digit Span Forward 12/16 ss= 13 High average  Digit Span Backward 11/16 ss= 14 Superior  WMS-IV Subtests     LM I 12/53 ss= 3 Impaired  LM II 4/39 ss= 3 Impaired  LM II Recognition 14/23 Cum %: <2 Impaired  RBANS Subtests     Figure Copy 17/20 Z= -0.7 Average  Figure Recall 4/20 Z= -2.4 Impaired  Semantic Fluency 8 Z= -2.8 Severely impaired  CVLT-II Scores     Trial 1 0/9 Z= -4.5 Severely impaired  Trial 4 8/9 Z= 0.5 Average  Trials 1-4 total 16/36 T= 28 Impaired  SD Free Recall 4/9 Z= -1.5 Borderline   LD Free Recall 2/9 Z= -1.5 Borderline  LD Cued Recall 5/9 Z= -0.5 Average  Recognition Discriminability 8/9 hits 3 false positives Z= -0.5 Average  Forced Choice Recognition 8/9  Abnormal  NAB Bill Payment 18/19 T= 53 Average  BNT 46/60 T= 18 Severely impaired  BDAE Complex Ideational Material 12/12  WNL  COWAT-FAS 39 T= 48 Average  COWAT-Animals 7 T= 23 Severely impaired  Trail Making Test A  70" 0 errors T= 21 Severely impaired  Trail Making Test B  114" 0 errors T= 41 Low  average  Clock Drawing   WNL  SDMT     Written 29/110 Z= -1.3 Low average  Oral 42/110 Z= -0.8 Low average  BDI-II 15/63  Mild  GAD-7 12/21  Moderate      Description of Test Results:  Premorbid verbal intellectual abilities were estimated to have been within the superior range based on a test of word reading.   Psychomotor processing speed was borderline to low average, representing significant decline from 2017.   Auditory attention and working memory were high average to superior (stable).   Visual-spatial construction was low end of average to average. His performance on a block construction task was significantly reduced relative to 2017.   Language abilities were variable. Specifically, confrontation naming was impaired (significant decline from 2017), and semantic verbal fluency was severely impaired (significant decline from 2017). Auditory comprehension of complex ideational material was intact (stable). On a simulated bill payment task requiring multiple aspects of both receptive and expressive language, he performed within the average range (stable).   With regard to verbal memory, encoding and acquisition of non-contextual information (i.e., word list) was impaired (significant decline from 2017). After a brief distracter task, free recall was borderline (4/9 items, improved from 2017). After a delay, free recall was borderline (2/9 items, stable). Cued recall was average (5/9 items, improved from 2017). Performance on a yes/no recognition task was average (stable) but performance on a forced choice recognition task was impaired (reduced from 2017). On another verbal memory test, encoding and acquisition of contextual auditory information (i.e., short stories) was impaired. After a delay, free recall was impaired. Performance on a yes/no recognition task was impaired. With regard to non-verbal memory, delayed free recall of visual information was impaired (significant decline from  2017).   Executive functioning was variable. Mental flexibility and set-shifting were low average on Trails B (slight decline from 2017). Verbal fluency with phonemic search restrictions was average (significant decline from 2017). Verbal abstract reasoning was superior (stable). Performance on a clock drawing task was normal (improved from 2017).   On a self-report measure of mood, the patient's responses were indicative of clinically significant depression consistent with 2017. Symptoms endorsed included: mild anhedonia, pessimism, feelings of failure, self-dislike, self-criticalness, tearfulness, restlessness, loss of interest, indecisiveness, loss of energy, irritability, reduced appetite, concentration difficulty, fatigue and reduced libido. He denied suicidal ideation or intention. On a self-report measure of anxiety, the patient endorsed clinically significant generalized anxiety consistent with 2017 and characterized by inability to control worrying, excessive worries, trouble relaxing, restlessness, nervousness, irritability and fear of  something awful happening.    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 Depression and anxiety. Results of cognitive testing are abnormal with several areas of impairment and significant interval decline relative to last evaluation almost 2 years ago. Additionally, he and his wife report reduced ability to manage instrumental ADLs due to cognitive/behavioral changes. As such, diagnostic criteria for dementia syndrome are now met, and a neurodegenerative process is likely. Areas of prominent decline on cognitive testing include memory encoding and retrieval, confrontation naming, semantic verbal fluency, visual-spatial construction, and psychomotor processing speed. The patient's cognitive profile is suggestive of medial temporal lobe involvement given the prominent memory and language impairment. Alzheimer's disease is considered  the most likely etiology of his dementia. LBD was also considered given his tremor, new onset gait instability/falls, and visual-spatial dysfunction; however, it is still not clear that he is experiencing true parkinsonism, and his memory impairment is more pronounced than executive dysfunction. The patient's increased irritability and reduced judgment are likely related to his dementia. He continues to report a high level of depression and generalized anxiety.     Recommendations/Plan: Based on the findings of the present evaluation, the following recommendations are offered:  1. The patient appears to be an appropriate candidate for aholinesterase inhibitor therapy. He will follow up with Dr. Carles Collet about this, as well as his issues with gait instability/falls and questions about primidone. 2. I discussed that there has been some research done showing that treatment of sleep apnea may enhance cognitive function even in AD.  3. I recommended a geriatric psychiatry consultation to review his current medications for depression/anxiety and see if a change is warranted. He accepted a referral to see Dr. Casimiro Needle. 4. The patient and his wife will benefit from continued education and support regarding his diagnosis. Information on local resources was provided. 5. Due to his cognitive difficulties, his wife should continue assisting with IADLs (meds, finances, appointments, driving, cooking).    Feedback to Patient: [name] and his wife returned for a feedback appointment on 06/13/2018 to review the results of his neuropsychological evaluation with this provider. 40 minutes face-to-face time was spent reviewing his test results, my impressions and my recommendations as detailed above.    Total time spent on this patient's case: 80 minutes for neurobehavioral status exam with psychologist (CPT code [phone]); 105 minutes of testing/scoring by psychometrician under psychologist's supervision (CPT codes  [phone], [phone] units); 200 minutes for integration of patient data, interpretation of standardized test results and clinical data, clinical decision making, treatment planning and preparation of this report, and interactive feedback with review of results to the patient/family by psychologist (CPT codes [phone], [phone] units).      Thank you for your referral of [name]. Please feel free to contact me if you have any questions or concerns regarding this report.

## 2018-06-13 ENCOUNTER — Telehealth: Payer: Self-pay | Admitting: Neurology

## 2018-06-13 ENCOUNTER — Ambulatory Visit (INDEPENDENT_AMBULATORY_CARE_PROVIDER_SITE_OTHER): Payer: Medicare Other | Admitting: Psychology

## 2018-06-13 ENCOUNTER — Encounter: Payer: Self-pay | Admitting: Psychology

## 2018-06-13 DIAGNOSIS — G301 Alzheimer's disease with late onset: Secondary | ICD-10-CM

## 2018-06-13 DIAGNOSIS — F0281 Dementia in other diseases classified elsewhere with behavioral disturbance: Secondary | ICD-10-CM | POA: Diagnosis not present

## 2018-06-13 DIAGNOSIS — F02818 Dementia in other diseases classified elsewhere, unspecified severity, with other behavioral disturbance: Secondary | ICD-10-CM

## 2018-06-13 DIAGNOSIS — F321 Major depressive disorder, single episode, moderate: Secondary | ICD-10-CM

## 2018-06-13 NOTE — Telephone Encounter (Signed)
Patient will call back if he needs the refill he wanted to check before we call one in

## 2018-06-19 ENCOUNTER — Encounter: Payer: Self-pay | Admitting: Neurology

## 2018-06-19 ENCOUNTER — Ambulatory Visit (INDEPENDENT_AMBULATORY_CARE_PROVIDER_SITE_OTHER): Payer: Medicare Other | Admitting: Neurology

## 2018-06-19 ENCOUNTER — Telehealth: Payer: Self-pay | Admitting: Neurology

## 2018-06-19 VITALS — BP 124/66 | HR 84 | Ht 67.0 in | Wt 144.0 lb

## 2018-06-19 DIAGNOSIS — G4733 Obstructive sleep apnea (adult) (pediatric): Secondary | ICD-10-CM | POA: Diagnosis not present

## 2018-06-19 DIAGNOSIS — F321 Major depressive disorder, single episode, moderate: Secondary | ICD-10-CM

## 2018-06-19 DIAGNOSIS — G250 Essential tremor: Secondary | ICD-10-CM | POA: Diagnosis not present

## 2018-06-19 DIAGNOSIS — F320 Major depressive disorder, single episode, mild: Secondary | ICD-10-CM

## 2018-06-19 MED ORDER — DONEPEZIL HCL 5 MG PO TABS: 5.0000 mg | ORAL_TABLET | Freq: Every day | ORAL | 0 refills | Status: DC

## 2018-06-19 MED ORDER — PRIMIDONE 50 MG PO TABS: 50.0000 mg | ORAL_TABLET | Freq: Two times a day (BID) | ORAL | 1 refills | Status: DC

## 2018-06-19 MED ORDER — DONEPEZIL HCL 10 MG PO TABS: 10.0000 mg | ORAL_TABLET | Freq: Every day | ORAL | 1 refills | Status: DC

## 2018-06-19 NOTE — Progress Notes (Signed)
[name] was seen today in the movement disorders clinic for neurologic consultation at the request of Denita Lung, MD.  Pt is accompanied by his wife who supplements the history.   The consultation is for the evaluation of tremor.  The records that were made available to me were reviewed.  Tremor was first noted in the medical record in 07/2012 and it was documented as R hand tremor that had been going on for one year.  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   Wife states that it has gotten progressively worse with time.  Pt reports that it is both hands, always has been both hands but he is L hand dominant.  Pt states that he notices tremor most when doing fine motor coordination (trying to string guitar last night).  There is no rest tremor.  There is no tremor in the legs.  No family hx of tremor.    07/03/16 update:  Pt f/u today, accompanied by his wife who supplements the history.  He has a hx of tremor.  This has gotten worse over the last 2 years.  He is not too bothered by tremor but wife is.  He does get frustrated by it.   I have not seen him since 08/2014.  The records that were made available to me were reviewed.  He was concerned last visit about memory loss and that seems to still be a concern.  His PCP thought related to depression and started on wellbutrin.  Pt states that it is all "trivial" stuff.  He had trouble remembering obama's first name.  He has trouble remembering if he did a routine task.  He has had a couple of instances where the bills didn't get paid despite he had a check list.  He doesn't forget to take his meds.  He fills his own pill box but his wife admits that she checks after him to make sure that he does that right.  He drives and has no trouble in the daylight per pt and wife but having trouble at night, mostly because he is having trouble seeing at night.    12/11/17 update: Patient is seen today in  follow-up.  He is accompanied by his wife who supplements the history.  I have not seen the patient in a year and a half.  He did have neurocognitive testing in December, 2017, which demonstrated mild cognitive impairment as well as anxiety and depression.  Dr. Marcia Brash notes indicated that she was concerned that this could represent a prodromal Alzheimer's disease based on areas of impairment.  She also thought untreated sleep apnea could contribute to his cognitive decline.  The records that were made available to me were reviewed since our last visit.  His PCP did tell him in Feb he was going to send him back to the sleep doc to try and get CPAP refitted.  He reports that this didn't happened and they didn't know that they were to do this.  They cannot even find the CPAP mask.  He reports that he last saw a sleep physician 12 years ago.  Pt/wife both report memory change.  He sometimes cannot remember street names in neighborhood.  He has word finding trouble.  He doesn't remember things wife told him.  Wife does medications.  Wife states that they have set up a system for bill paying because he has had trouble  with this.  Pt does have hx of essential tremor and they report that tremor is increasing.  Tremor is in hands and feet.  MRI of the brain was completed since our last visit and personally reviewed.  This was dated July 18, 2016.  It was essentially normal.  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  06/19/18 update: Patient is seen today in follow-up for memory change and tremor. This patient is accompanied in the office by his spouse who supplements the history. Patient just saw Dr. Si Raider for testing on May 17, 2018 and received feedback from her on June 13, 2018.  There was evidence of mild dementia, most likely due to Alzheimer's disease.  There is evidence of anxiety and depression.  She referred him to see Dr.  Casimiro Needle but they haven't heard about that.   They ask about sleep apnea and alternative treatments other than cpap.  Last saw sleep med many years ago.  In regards to tremor, pt is on primidone. He initially found it very effective but now not as much.  Asks if he can increase.  ALLERGIES:  No Known Allergies  CURRENT MEDICATIONS:  Outpatient Encounter Medications as of 06/19/2018  Medication Sig  . aspirin 81 MG tablet Take 81 mg by mouth daily.  Marland Kitchen buPROPion (WELLBUTRIN SR) 150 MG 12 hr tablet TAKE 1 TABLET(150 MG) BY MOUTH TWICE DAILY  . cholecalciferol (VITAMIN D) 1000 UNITS tablet Take 2,000 Units by mouth daily. TAKES 2000 UNITS DAILY  . dutasteride (AVODART) 0.5 MG capsule TAKE 1 CAPSULE(0.5 MG) BY MOUTH DAILY  . Ibuprofen-Diphenhydramine Cit (ADVIL PM PO) Take by mouth.  . Multiple Vitamins-Minerals (MULTIVITAMIN WITH MINERALS) tablet Take 1 tablet by mouth daily.    . polyethylene glycol powder (GLYCOLAX/MIRALAX) powder Take 17 g by mouth daily. (Patient taking differently: Take 17 g by mouth daily. PRN)  . primidone (MYSOLINE) 50 MG tablet Take 1 tablet (50 mg total) by mouth 2 (two) times daily.  . psyllium (METAMUCIL) 58.6 % powder Take 1 packet by mouth 3 (three) times daily.  . rosuvastatin (CRESTOR) 20 MG tablet Take 1 tablet (20 mg total) by mouth daily.  Marland Kitchen terazosin (HYTRIN) 2 MG capsule TAKE 1 CAPSULE(2 MG) BY MOUTH DAILY  . TRINTELLIX 20 MG TABS tablet TAKE 1 TABLET BY MOUTH DAILY  . [DISCONTINUED] primidone (MYSOLINE) 50 MG tablet Take 1 tablet (50 mg total) by mouth at bedtime.  . donepezil (ARICEPT) 10 MG tablet Take 1 tablet (10 mg total) by mouth at bedtime.  . donepezil (ARICEPT) 5 MG tablet Take 1 tablet (5 mg total) by mouth at bedtime.   No facility-administered encounter medications on file as of 06/19/2018.     PAST MEDICAL HISTORY:   Past Medical History:  Diagnosis Date  . Allergy   . Arthritis    osteoarthritis  . Cancer [facility])    testicular, age 58,  orchiectomy 33  . Cataract    B/L CATARACTS NO SURGERY  . Depression   . Dyslipidemia   . Heartburn    TAKES ZANTAC  . Hypertension   . Hypogonadism male   . Prostate cancer ([place]) 06/07/11   gleason 3+3=6, vol 59.5 cc  . Sleep apnea   . Status post chemotherapy    testicular cancer 1992, Dr Beryle Beams    PAST SURGICAL HISTORY:   Past Surgical History:  Procedure Laterality Date  . APPENDECTOMY    . COLONOSCOPY  2007  gessner  . INGUINAL HERNIA REPAIR     w/orchiectomy 1992, retroperitoneal lymph node dissection  . MENISECTOMY     R&L knee surgeries  . PROSTATE SURGERY     biopsy x 2    SOCIAL HISTORY:   Social History   Socioeconomic History  . Marital status: Married    Spouse name: Not on file  . Number of children: 0  . Years of education: Not on file  . Highest education level: Not on file  Occupational History  . Occupation: RETIRED    Employer: [employer]  Social Needs  . Financial resource strain: Not on file  . Food insecurity:    Worry: Not on file    Inability: Not on file  . Transportation needs:    Medical: Not on file    Non-medical: Not on file  Tobacco Use  . Smoking status: Former Smoker    Packs/day: 1.00    Years: 20.00    Pack years: 20.00    Types: Cigarettes    Last attempt to quit: 08/03/1999    Years since quitting: 18.8  . Smokeless tobacco: Never Used  Substance and Sexual Activity  . Alcohol use: Yes    Alcohol/week: 6.0 standard drinks    Types: 6 Cans of beer per week    Comment: DAILY BEERS 2-3 12 OZ  . Drug use: No  . Sexual activity: Yes  Lifestyle  . Physical activity:    Days per week: Not on file    Minutes per session: Not on file  . Stress: Not on file  Relationships  . Social connections:    Talks on phone: Not on file    Gets together: Not on file    Attends religious service: Not on file    Active member of club or organization: Not on file    Attends meetings of clubs or  organizations: Not on file    Relationship status: Not on file  . Intimate partner violence:    Fear of current or ex partner: Not on file    Emotionally abused: Not on file    Physically abused: Not on file    Forced sexual activity: Not on file  Other Topics Concern  . Not on file  Social History Narrative  . Not on file    FAMILY HISTORY:   Family Status  Relation Name Status  . Mother  Deceased       lung cancer  . Father  Deceased       lung cancer  . Brother  Alive       unknown    ROS:  Review of Systems  Constitutional: Negative.   HENT: Negative.   Eyes: Negative.   Respiratory: Negative.   Cardiovascular: Negative.   Gastrointestinal: Negative.   Genitourinary: Negative.   Skin: Negative.     PHYSICAL EXAMINATION:    VITALS:   Vitals:   06/19/18 1045  BP: 124/66  Pulse: 84  SpO2: 95%  Weight: 144 lb (65.3 kg)  Height: 5\' 7"  (1.702 m)    GEN:  The patient appears stated age and is in NAD. HEENT:  Normocephalic, atraumatic.  The mucous membranes are moist. The superficial temporal arteries are without ropiness or tenderness. CV:  RRR Lungs:  CTAB Neck/HEME:  There are no carotid bruits bilaterally.  Neurological examination:  Orientation:  Montreal Cognitive Assessment  07/03/2016 09/03/2014  Visuospatial/ Executive (0/5) 3 5  Naming (0/3) 3 3  Attention: Read list  of digits (0/2) 2 2  Attention: Read list of letters (0/1) 1 1  Attention: Serial 7 subtraction starting at 100 (0/3) 3 3  Language: Repeat phrase (0/2) 2 2  Language : Fluency (0/1) 1 1  Abstraction (0/2) 2 2  Delayed Recall (0/5) 0 2  Orientation (0/6) 6 6  Total 23 27  Adjusted Score (based on education) 23 -    GEN:  The patient appears stated age and is in NAD. HEENT:  Normocephalic, atraumatic.  The mucous membranes are moist. The superficial temporal arteries are without ropiness or tenderness. CV:  RRR Lungs:  CTAB Neck/HEME:  There are no carotid bruits  bilaterally.  Neurological examination:  Orientation: The patient is alert and oriented x3.  He does look to his wife for finer aspects of the history. Cranial nerves: There is good facial symmetry. The speech is fluent and clear. Soft palate rises symmetrically and there is no tongue deviation. Hearing is intact to conversational tone. Sensation: Sensation is intact to light touch throughout Motor: Strength is 5/5 in the bilateral upper and lower extremities.   Shoulder shrug is equal and symmetric.  There is no pronator drift.  Movement examination: Tone: There is normal tone in the RUE.  There is normal tone in the LUE.  There is normal tone in the RLE.  There is normal tone in the LLE.  Abnormal movements: There is no rest tremor.  There is mild intention tremor.  He has more tremor when he is given a weight, especially in the right hand.  He has mild trouble with Archimedes spirals. Coordination:  There is no decremation with RAM's, with any form of RAMS, including alternating supination and pronation of the forearm, hand opening and closing, finger taps, heel taps and toe taps. Gait and Station: The patient has no difficulty arising out of a deep-seated chair without the use of the hands. The patient's stride length is normal.     Labs:  Lab Results  Component Value Date   VITAMINB12 571 06/08/2016     Chemistry      Component Value Date/Time   NA 142 09/17/2017 1148   K 4.6 09/17/2017 1148   CL 104 09/17/2017 1148   CO2 23 09/17/2017 1148   BUN 16 09/17/2017 1148   CREATININE 0.79 09/17/2017 1148   CREATININE 0.84 06/08/2016 1425      Component Value Date/Time   CALCIUM 9.8 09/17/2017 1148   ALKPHOS 51 09/17/2017 1148   AST 23 09/17/2017 1148   ALT 26 09/17/2017 1148   BILITOT 0.8 09/17/2017 1148     Lab Results  Component Value Date   TSH 1.65 12/11/2017     ASSESSMENT/PLAN:  1.  Essential tremor  -increase primidone to 50 mg twice per day 2.  Alzheimer's  dementia  -Neurocognitive testing in December, 2017 did not reveal evidence of dementia, but there was mild cognitive impairment, which was concerning for a prodromal Alzheimer's disease based on areas of impairment.  Repeat neurocognitive testing in October, 2019 revealed evidence of mild Alzheimer's dementia.  Anxiety and depression was also noted.  Patient was referred to Dr. Casimiro Needle by Dr. Si Raider.  They have not heard about that.  I will check on that referral.  -After some discussion, they wanted to start donepezil.  We will start 5 mg for 1 month and then increase to 10 mg daily.  Risks, benefits, side effects and alternative therapies were discussed.  The opportunity to ask questions was given  and they were answered to the best of my ability.  The patient expressed understanding and willingness to follow the outlined treatment protocols. 3.  OSAS  -he was dx years ago but didn't want do cpap.  Will schedule repeat psg (split night) 4.  anxiety and depression  -check with referral to  Dr. Casimiro Needle  5.  F/u 6-8 months.  Much greater than 50% of this visit was spent in counseling and coordinating care.  Total face to face time:  30 min

## 2018-06-19 NOTE — Patient Instructions (Addendum)
Start donepezil - 5 mg daily for a month and then 10 mg daily thereafter  Increase primidone - 50 mg - 1 tablet twice per day  You can contact Dr. Casimiro Needle directly at [phone] to schedule an appointment.   We have ordered a sleep study. These are performed at [facility]. They should call you to schedule this study. If you don't hear from them please contact them at [phone].

## 2018-06-19 NOTE — Telephone Encounter (Signed)
Spoke with patient's wife and she states Dr. Casimiro Needle is booked til January, I did let her know that doesn't seem like too long of a wait, but gave her another number for his other practice and sent a referral via Epic.

## 2018-06-19 NOTE — Telephone Encounter (Signed)
Patient's wife is calling in stating that she needs to speak with the nurse. Please call her back at [phone]. Thanks!

## 2018-07-10 ENCOUNTER — Telehealth: Payer: Self-pay | Admitting: Neurology

## 2018-07-10 NOTE — Telephone Encounter (Signed)
[facility] faxed over a document stating that the patient had been called (left msg) so they could set up an appt.  Dr Si Raider had referred this patient over to them.

## 2018-07-16 ENCOUNTER — Other Ambulatory Visit: Payer: Self-pay | Admitting: Neurology

## 2018-07-26 ENCOUNTER — Encounter: Payer: Self-pay | Admitting: Medical

## 2018-07-26 ENCOUNTER — Ambulatory Visit (INDEPENDENT_AMBULATORY_CARE_PROVIDER_SITE_OTHER): Payer: Medicare Other | Admitting: Medical

## 2018-07-26 VITALS — BP 130/86 | HR 78 | Temp 97.9°F | Resp 16 | Ht 67.0 in | Wt 144.4 lb

## 2018-07-26 DIAGNOSIS — R109 Unspecified abdominal pain: Secondary | ICD-10-CM | POA: Diagnosis not present

## 2018-07-26 DIAGNOSIS — F329 Major depressive disorder, single episode, unspecified: Secondary | ICD-10-CM

## 2018-07-26 DIAGNOSIS — R112 Nausea with vomiting, unspecified: Secondary | ICD-10-CM

## 2018-07-26 DIAGNOSIS — I889 Nonspecific lymphadenitis, unspecified: Secondary | ICD-10-CM | POA: Diagnosis not present

## 2018-07-26 DIAGNOSIS — F32A Depression, unspecified: Secondary | ICD-10-CM

## 2018-07-26 LAB — CBC WITH DIFFERENTIAL/PLATELET
BASOS ABS: 0.1 10*3/uL (ref 0.0–0.2)
Basos: 1 %
EOS (ABSOLUTE): 0.1 10*3/uL (ref 0.0–0.4)
Eos: 2 %
HEMOGLOBIN: 14.6 g/dL (ref 13.0–17.7)
Hematocrit: 41.1 % (ref 37.5–51.0)
IMMATURE GRANS (ABS): 0 10*3/uL (ref 0.0–0.1)
IMMATURE GRANULOCYTES: 0 %
LYMPHS: 15 %
Lymphocytes Absolute: 0.8 10*3/uL (ref 0.7–3.1)
MCH: 32.4 pg (ref 26.6–33.0)
MCHC: 35.5 g/dL (ref 31.5–35.7)
MCV: 91 fL (ref 79–97)
MONOCYTES: 11 %
Monocytes Absolute: 0.5 10*3/uL (ref 0.1–0.9)
NEUTROS ABS: 3.5 10*3/uL (ref 1.4–7.0)
NEUTROS PCT: 71 %
PLATELETS: 223 10*3/uL (ref 150–450)
RBC: 4.5 x10E6/uL (ref 4.14–5.80)
RDW: 11.8 % — ABNORMAL LOW (ref 12.3–15.4)
WBC: 4.9 10*3/uL (ref 3.4–10.8)

## 2018-07-26 LAB — COMPREHENSIVE METABOLIC PANEL
ALT: 24 IU/L (ref 0–44)
AST: 25 IU/L (ref 0–40)
Albumin/Globulin Ratio: 2.5 — ABNORMAL HIGH (ref 1.2–2.2)
Albumin: 4.7 g/dL (ref 3.5–4.8)
Alkaline Phosphatase: 62 IU/L (ref 39–117)
BILIRUBIN TOTAL: 0.5 mg/dL (ref 0.0–1.2)
BUN/Creatinine Ratio: 17 (ref 10–24)
BUN: 15 mg/dL (ref 8–27)
CHLORIDE: 100 mmol/L (ref 96–106)
CO2: 25 mmol/L (ref 20–29)
Calcium: 9.6 mg/dL (ref 8.6–10.2)
Creatinine, Ser: 0.89 mg/dL (ref 0.76–1.27)
GFR calc non Af Amer: 87 mL/min/{1.73_m2} (ref >59)
GFR, EST AFRICAN AMERICAN: 100 mL/min/{1.73_m2} (ref >59)
GLUCOSE: 73 mg/dL (ref 65–99)
Globulin, Total: 1.9 g/dL (ref 1.5–4.5)
Potassium: 4.7 mmol/L (ref 3.5–5.2)
Sodium: 140 mmol/L (ref 134–144)
TOTAL PROTEIN: 6.6 g/dL (ref 6.0–8.5)

## 2018-07-26 MED ORDER — OMEPRAZOLE 40 MG PO CPDR: 40.0000 mg | DELAYED_RELEASE_CAPSULE | Freq: Every day | ORAL | 0 refills | Status: DC

## 2018-07-26 NOTE — Progress Notes (Signed)
Subjective: Chief Complaint  Patient presents with  . not feeling well    dry heaves X persistant 1 week ago,  3-5 hrs after meal    Here for dry heaves x 1 week ago.   Over the week has had more frequent dry heaving.  However wife says dry heaving for a year, but in the past week having a daily heaving episode.    Tries to vomit and nothing comes out.    Has had some sniffling, coughing.   Lately seems to be worse between meals and worse leading up to next meal.   There seems to be 4-5 hours between attacks.      He gets an ache in the abdomen, intermittent, mild.   No back pain.   No diarrhea, no constipation.   Takes miralax daily to control constipation.  No urinary c/o.  No fever.   Appetite is down for at least a year, but worse in past year.    Aricept was added 06/19/18.    Has tried some Pepcid, some other antacids recently without help.  He thinks its worse when stomach is most empty  Past Medical History:  Diagnosis Date  . Allergy   . Arthritis    osteoarthritis  . Cancer [facility])    testicular, age 31, orchiectomy 26  . Cataract    B/L CATARACTS NO SURGERY  . Depression   . Dyslipidemia   . Heartburn    TAKES ZANTAC  . Hypertension   . Hypogonadism male   . Prostate cancer ([place]) 06/07/11   gleason 3+3=6, vol 59.5 cc  . Sleep apnea   . Status post chemotherapy    testicular cancer 1992, Dr Beryle Beams   Current Outpatient Medications on File Prior to Visit  Medication Sig Dispense Refill  . aspirin 81 MG tablet Take 81 mg by mouth daily.    Marland Kitchen buPROPion (WELLBUTRIN SR) 150 MG 12 hr tablet TAKE 1 TABLET(150 MG) BY MOUTH TWICE DAILY 180 tablet 0  . cholecalciferol (VITAMIN D) 1000 UNITS tablet Take 2,000 Units by mouth daily. TAKES 2000 UNITS DAILY    . donepezil (ARICEPT) 10 MG tablet Take 1 tablet (10 mg total) by mouth at bedtime. 30 tablet 5  . dutasteride (AVODART) 0.5 MG capsule TAKE 1 CAPSULE(0.5 MG) BY MOUTH DAILY 90 capsule 3  . Ibuprofen-Diphenhydramine  Cit (ADVIL PM PO) Take by mouth.    . Multiple Vitamins-Minerals (MULTIVITAMIN WITH MINERALS) tablet Take 1 tablet by mouth daily.      . polyethylene glycol powder (GLYCOLAX/MIRALAX) powder Take 17 g by mouth daily. (Patient taking differently: Take 17 g by mouth daily. PRN) 3350 g 11  . primidone (MYSOLINE) 50 MG tablet Take 1 tablet (50 mg total) by mouth 2 (two) times daily. 180 tablet 1  . psyllium (METAMUCIL) 58.6 % powder Take 1 packet by mouth 3 (three) times daily.    . rosuvastatin (CRESTOR) 20 MG tablet Take 1 tablet (20 mg total) by mouth daily. 90 tablet 3  . terazosin (HYTRIN) 2 MG capsule TAKE 1 CAPSULE(2 MG) BY MOUTH DAILY 90 capsule 3  . TRINTELLIX 20 MG TABS tablet TAKE 1 TABLET BY MOUTH DAILY 90 tablet 1   No current facility-administered medications on file prior to visit.    Past Surgical History:  Procedure Laterality Date  . APPENDECTOMY    . COLONOSCOPY  2007   gessner  . INGUINAL HERNIA REPAIR     w/orchiectomy 1992, retroperitoneal lymph node dissection  .  MENISECTOMY     R&L knee surgeries  . PROSTATE SURGERY     biopsy x 2     ROS as in subjective   Objective: BP 130/86   Pulse 78   Temp 97.9 F (36.6 C) (Oral)   Resp 16   Ht 5\' 7"  (1.702 m)   Wt 144 lb 6.4 oz (65.5 kg)   SpO2 97%   BMI 22.62 kg/m   Wt Readings from Last 3 Encounters:  07/26/18 144 lb 6.4 oz (65.5 kg)  06/19/18 144 lb (65.3 kg)  12/11/17 146 lb 8 oz (66.5 kg)   General appearance: alert, no distress, WD/WN,  HEENT: normocephalic, sclerae anicteric, TMs pearly, nares patent, no discharge or erythema, pharynx normal Oral cavity: MMM, no lesions Neck: supple, there is a fullness likely lymphadenopathy of the right neck over the posterior triangle superiorly, asymmetric compared to the left neck, otherwise no lymphadenopathy, no thyromegaly, no masses Heart: RRR, normal S1, S2, no murmurs Lungs: CTA bilaterally, no wheezes, rhonchi, or rales Abdomen: +bs, soft, non tender,  non distended, no masses, no hepatomegaly, no splenomegaly Pulses: 2+ symmetric, upper and lower extremities, normal cap refill      Assessment: Encounter Diagnoses  Name Primary?  . Nausea and vomiting, intractability of vomiting not specified, unspecified vomiting type Yes  . Abdominal discomfort   . Lymphadenitis   . Depression, unspecified depression type      Plan: We discussed his symptoms and concerns, possible causes.  Also discussed the right neck fullness that could be lymphadenopathy.  Discussed the recommendations below.  Pending labs will likely set up for CT neck.  We discussed avoiding GERD triggers, advised he cut back on his 2 beers per night  Patient Instructions  Findings today: lymph node swollen in right neck, nausea, dry heaving  Recommendations  Begin omeprazole 1 tablet daily in the morning approximately 30 to 40 minutes before breakfast for the next 2 or 3 weeks  Lets see if this helps calm down your symptoms  Avoid eating fast, and avoid eating close to bedtime such as within an hour of eating  Avoid foods that can trigger acid reflux such as spicy foods, citrus, tomato-based foods, salsa, spaghetti sauce, greasy and fried foods and big portions  We will call back with lab results  And once I get your lab results we will likely set you up for a scan of your right neck as it appears you have some lymph nodes swollen in the right neck    Newell was seen today for not feeling well.  Diagnoses and all orders for this visit:  Nausea and vomiting, intractability of vomiting not specified, unspecified vomiting type -     Comprehensive metabolic panel -     CBC with Differential/Platelet  Abdominal discomfort -     Comprehensive metabolic panel -     CBC with Differential/Platelet  Lymphadenitis -     Comprehensive metabolic panel -     CBC with Differential/Platelet  Depression, unspecified depression type -     Comprehensive metabolic  panel -     CBC with Differential/Platelet  Other orders -     omeprazole (PRILOSEC) 40 MG capsule; Take 1 capsule (40 mg total) by mouth daily.

## 2018-07-26 NOTE — Patient Instructions (Signed)
Findings today: lymph node swollen in right neck, nausea, dry heaving  Recommendations  Begin omeprazole 1 tablet daily in the morning approximately 30 to 40 minutes before breakfast for the next 2 or 3 weeks  Lets see if this helps calm down your symptoms  Avoid eating fast, and avoid eating close to bedtime such as within an hour of eating  Avoid foods that can trigger acid reflux such as spicy foods, citrus, tomato-based foods, salsa, spaghetti sauce, greasy and fried foods and big portions  We will call back with lab results  And once I get your lab results we will likely set you up for a scan of your right neck as it appears you have some lymph nodes swollen in the right neck

## 2018-07-29 ENCOUNTER — Other Ambulatory Visit: Payer: Self-pay | Admitting: Medical

## 2018-07-29 DIAGNOSIS — R221 Localized swelling, mass and lump, neck: Secondary | ICD-10-CM

## 2018-08-06 ENCOUNTER — Ambulatory Visit
Admission: RE | Admit: 2018-08-06 | Discharge: 2018-08-06 | Disposition: A | Discharge status: Home / Self Care | Payer: Medicare Other | Source: Ambulatory Visit | Attending: Medical | Admitting: Medical

## 2018-08-06 DIAGNOSIS — R221 Localized swelling, mass and lump, neck: Secondary | ICD-10-CM

## 2018-08-06 MED ORDER — IOPAMIDOL (ISOVUE-300) INJECTION 61%
75.0000 mL | Freq: Once | INTRAVENOUS | Status: AC | PRN
  Administered 2018-08-06: 75 mL via INTRAVENOUS

## 2018-08-08 ENCOUNTER — Ambulatory Visit (INDEPENDENT_AMBULATORY_CARE_PROVIDER_SITE_OTHER): Payer: Medicare Other | Admitting: Family Medicine

## 2018-08-08 ENCOUNTER — Encounter: Payer: Self-pay | Admitting: Family Medicine

## 2018-08-08 VITALS — BP 120/70 | HR 76 | Temp 97.6°F | Resp 16 | Ht 67.0 in | Wt 143.0 lb

## 2018-08-08 DIAGNOSIS — I700 Atherosclerosis of aorta: Secondary | ICD-10-CM | POA: Insufficient documentation

## 2018-08-08 DIAGNOSIS — Z8546 Personal history of malignant neoplasm of prostate: Secondary | ICD-10-CM

## 2018-08-08 DIAGNOSIS — Z8547 Personal history of malignant neoplasm of testis: Secondary | ICD-10-CM | POA: Insufficient documentation

## 2018-08-08 DIAGNOSIS — R221 Localized swelling, mass and lump, neck: Secondary | ICD-10-CM | POA: Diagnosis not present

## 2018-08-08 DIAGNOSIS — R9389 Abnormal findings on diagnostic imaging of other specified body structures: Secondary | ICD-10-CM | POA: Diagnosis not present

## 2018-08-08 NOTE — Progress Notes (Signed)
   Subjective:    Patient ID: [name], male    DOB: 01-03-1946, 73 y.o.   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  The scan also showed evidence of aortic atherosclerosis. He has a previous history of testicular cancer as well as prostate cancer.  Review of Systems     Objective:   Physical Exam Alert and in no distress.  Exam of the right neck does show fullness in the anterior cervical chain.       Assessment & Plan:  Localized swelling, mass or lump of neck - Plan: Ambulatory referral to ENT  Abnormal CT scan, neck - Plan: CT Chest W Contrast  Aortic atherosclerosis (HCC)  History of prostate cancer  History of testicular cancer I discussed the diagnosis with him and the steps needed to be taken.  We will order a CT of his chest, set him up for ear nose and throat.  Also discussed further evaluation and treatment including radiation possible surgery and chemotherapy.

## 2018-08-09 ENCOUNTER — Ambulatory Visit
Admission: RE | Admit: 2018-08-09 | Discharge: 2018-08-09 | Disposition: A | Discharge status: Home / Self Care | Payer: Medicare Other | Source: Ambulatory Visit | Attending: Family Medicine | Admitting: Family Medicine

## 2018-08-09 ENCOUNTER — Encounter (HOSPITAL_BASED_OUTPATIENT_CLINIC_OR_DEPARTMENT_OTHER): Payer: Medicare Other

## 2018-08-09 MED ORDER — IOPAMIDOL (ISOVUE-300) INJECTION 61%
75.0000 mL | Freq: Once | INTRAVENOUS | Status: AC | PRN
  Administered 2018-08-09: 75 mL via INTRAVENOUS

## 2018-08-10 ENCOUNTER — Encounter: Payer: Self-pay | Admitting: Family Medicine

## 2018-08-13 ENCOUNTER — Other Ambulatory Visit: Payer: Self-pay | Admitting: Otolaryngology

## 2018-08-14 ENCOUNTER — Encounter (HOSPITAL_BASED_OUTPATIENT_CLINIC_OR_DEPARTMENT_OTHER): Payer: Self-pay | Admitting: *Deleted

## 2018-08-14 ENCOUNTER — Other Ambulatory Visit: Payer: Self-pay

## 2018-08-15 ENCOUNTER — Other Ambulatory Visit: Payer: Self-pay

## 2018-08-15 ENCOUNTER — Encounter (HOSPITAL_BASED_OUTPATIENT_CLINIC_OR_DEPARTMENT_OTHER)
Admission: RE | Admit: 2018-08-15 | Discharge: 2018-08-15 | Disposition: A | Discharge status: Home / Self Care | Payer: Medicare Other | Source: Ambulatory Visit | Attending: Otolaryngology | Admitting: Otolaryngology

## 2018-08-15 DIAGNOSIS — Z01810 Encounter for preprocedural cardiovascular examination: Secondary | ICD-10-CM | POA: Diagnosis not present

## 2018-08-15 MED ORDER — OMEPRAZOLE 40 MG PO CPDR: 40.0000 mg | DELAYED_RELEASE_CAPSULE | Freq: Every day | ORAL | 0 refills | Status: DC

## 2018-08-15 NOTE — Progress Notes (Signed)
EKG reviewed by Dr. Singer, will proceed with surgery as scheduled.  

## 2018-08-19 ENCOUNTER — Encounter (HOSPITAL_BASED_OUTPATIENT_CLINIC_OR_DEPARTMENT_OTHER)
Admission: RE | Disposition: A | Discharge status: Home / Self Care | Payer: Self-pay | Source: Home / Self Care | Attending: Otolaryngology

## 2018-08-19 ENCOUNTER — Encounter (HOSPITAL_BASED_OUTPATIENT_CLINIC_OR_DEPARTMENT_OTHER): Payer: Self-pay | Admitting: *Deleted

## 2018-08-19 ENCOUNTER — Other Ambulatory Visit: Payer: Self-pay

## 2018-08-19 ENCOUNTER — Ambulatory Visit (HOSPITAL_BASED_OUTPATIENT_CLINIC_OR_DEPARTMENT_OTHER): Payer: Medicare Other | Admitting: Anesthesiology

## 2018-08-19 ENCOUNTER — Ambulatory Visit (HOSPITAL_BASED_OUTPATIENT_CLINIC_OR_DEPARTMENT_OTHER)
Admission: RE | Admit: 2018-08-19 | Discharge: 2018-08-19 | Disposition: A | Discharge status: Home / Self Care | Payer: Medicare Other | Attending: Otolaryngology | Admitting: Otolaryngology

## 2018-08-19 DIAGNOSIS — Z85118 Personal history of other malignant neoplasm of bronchus and lung: Secondary | ICD-10-CM | POA: Diagnosis not present

## 2018-08-19 DIAGNOSIS — Z79899 Other long term (current) drug therapy: Secondary | ICD-10-CM | POA: Insufficient documentation

## 2018-08-19 DIAGNOSIS — I10 Essential (primary) hypertension: Secondary | ICD-10-CM | POA: Diagnosis not present

## 2018-08-19 DIAGNOSIS — Z87891 Personal history of nicotine dependence: Secondary | ICD-10-CM | POA: Diagnosis not present

## 2018-08-19 DIAGNOSIS — D101 Benign neoplasm of tongue: Secondary | ICD-10-CM | POA: Diagnosis present

## 2018-08-19 DIAGNOSIS — F0280 Dementia in other diseases classified elsewhere without behavioral disturbance: Secondary | ICD-10-CM | POA: Insufficient documentation

## 2018-08-19 DIAGNOSIS — F329 Major depressive disorder, single episode, unspecified: Secondary | ICD-10-CM | POA: Insufficient documentation

## 2018-08-19 DIAGNOSIS — K219 Gastro-esophageal reflux disease without esophagitis: Secondary | ICD-10-CM | POA: Diagnosis not present

## 2018-08-19 DIAGNOSIS — D3705 Neoplasm of uncertain behavior of pharynx: Secondary | ICD-10-CM

## 2018-08-19 DIAGNOSIS — G309 Alzheimer's disease, unspecified: Secondary | ICD-10-CM | POA: Insufficient documentation

## 2018-08-19 HISTORY — PX: TONGUE BIOPSY: SHX6575

## 2018-08-19 HISTORY — DX: Unspecified dementia, unspecified severity, without behavioral disturbance, psychotic disturbance, mood disturbance, and anxiety: F03.90

## 2018-08-19 HISTORY — PX: DIRECT LARYNGOSCOPY: SHX5326

## 2018-08-19 SURGERY — LARYNGOSCOPY, DIRECT
Anesthesia: General | Site: Throat

## 2018-08-19 MED ORDER — DEXAMETHASONE SODIUM PHOSPHATE 4 MG/ML IJ SOLN
INTRAMUSCULAR | Status: DC | PRN
  Administered 2018-08-19: 10 mg via INTRAVENOUS

## 2018-08-19 MED ORDER — LIDOCAINE-EPINEPHRINE 1 %-1:100000 IJ SOLN
INTRAMUSCULAR | Status: AC
  Filled 2018-08-19: qty 1

## 2018-08-19 MED ORDER — FENTANYL CITRATE (PF) 100 MCG/2ML IJ SOLN
INTRAMUSCULAR | Status: DC | PRN
  Administered 2018-08-19: 50 ug via INTRAVENOUS

## 2018-08-19 MED ORDER — PROPOFOL 10 MG/ML IV BOLUS
INTRAVENOUS | Status: DC | PRN
  Administered 2018-08-19: 150 mg via INTRAVENOUS
  Administered 2018-08-19 (×2): 50 mg via INTRAVENOUS

## 2018-08-19 MED ORDER — LIDOCAINE 2% (20 MG/ML) 5 ML SYRINGE
INTRAMUSCULAR | Status: DC | PRN
  Administered 2018-08-19: 70 mg via INTRAVENOUS

## 2018-08-19 MED ORDER — SUCCINYLCHOLINE CHLORIDE 20 MG/ML IJ SOLN
INTRAMUSCULAR | Status: DC | PRN
  Administered 2018-08-19: 100 mg via INTRAVENOUS

## 2018-08-19 MED ORDER — ONDANSETRON HCL 4 MG/2ML IJ SOLN
INTRAMUSCULAR | Status: AC
  Filled 2018-08-19: qty 2

## 2018-08-19 MED ORDER — SCOPOLAMINE 1 MG/3DAYS TD PT72: 1.0000 | MEDICATED_PATCH | Freq: Once | TRANSDERMAL | Status: DC | PRN

## 2018-08-19 MED ORDER — MUPIROCIN 2 % EX OINT
TOPICAL_OINTMENT | CUTANEOUS | Status: AC
  Filled 2018-08-19: qty 22

## 2018-08-19 MED ORDER — EPINEPHRINE PF 1 MG/ML IJ SOLN
INTRAMUSCULAR | Status: DC | PRN
  Administered 2018-08-19: 1 mg

## 2018-08-19 MED ORDER — FENTANYL CITRATE (PF) 100 MCG/2ML IJ SOLN: 50.0000 ug | INTRAMUSCULAR | Status: DC | PRN

## 2018-08-19 MED ORDER — FENTANYL CITRATE (PF) 100 MCG/2ML IJ SOLN
INTRAMUSCULAR | Status: AC
  Filled 2018-08-19: qty 2

## 2018-08-19 MED ORDER — PROPOFOL 10 MG/ML IV BOLUS
INTRAVENOUS | Status: AC
  Filled 2018-08-19: qty 20

## 2018-08-19 MED ORDER — MIDAZOLAM HCL 2 MG/2ML IJ SOLN: 1.0000 mg | INTRAMUSCULAR | Status: DC | PRN

## 2018-08-19 MED ORDER — OXYMETAZOLINE HCL 0.05 % NA SOLN
NASAL | Status: AC
  Filled 2018-08-19: qty 15

## 2018-08-19 MED ORDER — LACTATED RINGERS IV SOLN
INTRAVENOUS | Status: DC
  Administered 2018-08-19: 10 mL/h via INTRAVENOUS
  Administered 2018-08-19 (×2): via INTRAVENOUS

## 2018-08-19 MED ORDER — DEXAMETHASONE SODIUM PHOSPHATE 10 MG/ML IJ SOLN
INTRAMUSCULAR | Status: AC
  Filled 2018-08-19: qty 1

## 2018-08-19 MED ORDER — SUCCINYLCHOLINE CHLORIDE 200 MG/10ML IV SOSY
PREFILLED_SYRINGE | INTRAVENOUS | Status: AC
  Filled 2018-08-19: qty 10

## 2018-08-19 MED ORDER — ONDANSETRON HCL 4 MG/2ML IJ SOLN
INTRAMUSCULAR | Status: DC | PRN
  Administered 2018-08-19: 4 mg via INTRAVENOUS

## 2018-08-19 MED ORDER — LIDOCAINE 2% (20 MG/ML) 5 ML SYRINGE
INTRAMUSCULAR | Status: AC
  Filled 2018-08-19: qty 5

## 2018-08-19 MED ORDER — PHENYLEPHRINE 40 MCG/ML (10ML) SYRINGE FOR IV PUSH (FOR BLOOD PRESSURE SUPPORT)
PREFILLED_SYRINGE | INTRAVENOUS | Status: AC
  Filled 2018-08-19: qty 10

## 2018-08-19 SURGICAL SUPPLY — 56 items
ADAPTER TUBE FLEX ULTRASET (MISCELLANEOUS) IMPLANT
BLADE SURG 15 STRL LF DISP TIS (BLADE) ×1 IMPLANT
BLADE SURG 15 STRL SS (BLADE)
CANISTER SUCT 1200ML W/VALVE (MISCELLANEOUS) ×3 IMPLANT
CONT SPEC 4OZ CLIKSEAL STRL BL (MISCELLANEOUS) ×4 IMPLANT
COVER MAYO STAND STRL (DRAPES) ×1 IMPLANT
COVER WAND RF STERILE (DRAPES) IMPLANT
DECANTER SPIKE VIAL GLASS SM (MISCELLANEOUS) IMPLANT
DEPRESSOR TONGUE BLADE STERILE (MISCELLANEOUS) IMPLANT
ELECT COATED BLADE 2.86 ST (ELECTRODE) IMPLANT
ELECT NDL BLADE 2-5/6 (NEEDLE) IMPLANT
ELECT NEEDLE BLADE 2-5/6 (NEEDLE) IMPLANT
ELECT REM PT RETURN 9FT ADLT (ELECTROSURGICAL) ×3
ELECT REM PT RETURN 9FT PED (ELECTROSURGICAL)
ELECTRODE REM PT RETRN 9FT PED (ELECTROSURGICAL) IMPLANT
ELECTRODE REM PT RTRN 9FT ADLT (ELECTROSURGICAL) IMPLANT
GAUZE SPONGE 4X4 12PLY STRL LF (GAUZE/BANDAGES/DRESSINGS) ×6 IMPLANT
GLOVE BIO SURGEON STRL SZ 6.5 (GLOVE) ×1 IMPLANT
GLOVE BIO SURGEON STRL SZ7.5 (GLOVE) ×3 IMPLANT
GLOVE BIO SURGEONS STRL SZ 6.5 (GLOVE) ×1
GLOVE BIOGEL PI IND STRL 7.0 (GLOVE) IMPLANT
GLOVE BIOGEL PI INDICATOR 7.0 (GLOVE) ×2
GOWN STRL REUS W/ TWL LRG LVL3 (GOWN DISPOSABLE) ×1 IMPLANT
GOWN STRL REUS W/TWL LRG LVL3 (GOWN DISPOSABLE) ×6
GUARD TEETH (MISCELLANEOUS) IMPLANT
MARKER SKIN DUAL TIP RULER LAB (MISCELLANEOUS) IMPLANT
NDL HYPO 18GX1.5 BLUNT FILL (NEEDLE) ×1 IMPLANT
NDL PRECISIONGLIDE 27X1.5 (NEEDLE) ×1 IMPLANT
NDL SPNL 22GX7 QUINCKE BK (NEEDLE) IMPLANT
NDL SPNL 25GX3.5 QUINCKE BL (NEEDLE) IMPLANT
NEEDLE HYPO 18GX1.5 BLUNT FILL (NEEDLE) ×3 IMPLANT
NEEDLE PRECISIONGLIDE 27X1.5 (NEEDLE) ×3 IMPLANT
NEEDLE SPNL 22GX7 QUINCKE BK (NEEDLE) IMPLANT
NEEDLE SPNL 25GX3.5 QUINCKE BL (NEEDLE) IMPLANT
NS IRRIG 1000ML POUR BTL (IV SOLUTION) ×3 IMPLANT
PACK BASIN DAY SURGERY FS (CUSTOM PROCEDURE TRAY) ×2 IMPLANT
PATTIES SURGICAL .5 X3 (DISPOSABLE) ×3 IMPLANT
PENCIL BUTTON HOLSTER BLD 10FT (ELECTRODE) ×1 IMPLANT
PENCIL FOOT CONTROL (ELECTRODE) ×1 IMPLANT
SHEET MEDIUM DRAPE 40X70 STRL (DRAPES) ×3 IMPLANT
SLEEVE SCD COMPRESS KNEE MED (MISCELLANEOUS) ×2 IMPLANT
SOLUTION BUTLER CLEAR DIP (MISCELLANEOUS) ×3 IMPLANT
SUCTION FRAZIER HANDLE 10FR (MISCELLANEOUS)
SUCTION TUBE FRAZIER 10FR DISP (MISCELLANEOUS) IMPLANT
SURGILUBE 2OZ TUBE FLIPTOP (MISCELLANEOUS) IMPLANT
SUT CHROMIC 5 0 RB 1 27 (SUTURE) IMPLANT
SUT SILK 3 0 TIES 17X18 (SUTURE)
SUT SILK 3-0 18XBRD TIE BLK (SUTURE) IMPLANT
SUT VIC AB 4-0 RB1 27 (SUTURE)
SUT VIC AB 4-0 RB1 27X BRD (SUTURE) IMPLANT
SYR CONTROL 10ML LL (SYRINGE) ×1 IMPLANT
SYR TB 1ML LL NO SAFETY (SYRINGE) IMPLANT
TOWEL GREEN STERILE FF (TOWEL DISPOSABLE) ×3 IMPLANT
TUBE CONNECTING 20'X1/4 (TUBING) ×1
TUBE CONNECTING 20X1/4 (TUBING) ×2 IMPLANT
YANKAUER SUCT BULB TIP NO VENT (SUCTIONS) IMPLANT

## 2018-08-19 NOTE — H&P (Signed)
Cc: Right neck and tongue mass  HPI: The patient is a 73 year old male who presents today with his wife.  The patient is seen in consultation requested by [place]. According to the patient, he first noted swallowing difficulty in 05/2018.  He was evaluated by Dorothea Ogle, PA-C, and was noted to have a right neck mass. He underwent a neck CT scan which showed a 3.4 cm exophytic mass at the right tongue base. In addition, he also has a necrotic right Level II lymph node measuring 3.4 cm in diameter.  Another 1.6 cm necrotic lymph node was present just inferiorly.  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  The patient quit the use of tobacco in 2000.  He has lost 35 lb over the past 2 years.  The patient has a history of testicular and prostate cancer.  He also reports frequent stomach discomfort and nausea.  His symptoms have improved with the use of Prilosec.  He has no previous ENT surgery.  He has early Alzheimer's disease.   The patient's review of systems (constitutional, eyes, ENT, cardiovascular, respiratory, GI, musculoskeletal, skin, neurologic, psychiatric, endocrine, hematologic, allergic) is noted in the ROS questionnaire.  It is reviewed with the patient.  Family health history: Lung cancer, heart disease.  Major events: Prostate cancer, appendectomy,.  Ongoing medical problems: Weight loss, hypertension, chest pain, reflux, depression, prostate and testicular cancer.  Social history: The patient is married. He is a former smoker. He denies the use of alcohol or illegal drugs.    Objective General: Communicates without difficulty, well nourished, no acute distress. Head: Normocephalic, no evidence injury, no tenderness, facial buttresses intact without stepoff. Face/sinus: No tenderness to palpation and percussion. Facial movement is normal and symmetric. Eyes: PERRL, EOMI. No scleral icterus, conjunctivae clear. Neuro: CN II exam  reveals vision grossly intact.  No nystagmus at any point of gaze. Ears: Auricles well formed without lesions.  Bilateral cerumen impaction.  Nose: External evaluation reveals normal support and skin without lesions.  Dorsum is intact.  Anterior rhinoscopy reveals healthy pink mucosa over anterior aspect of inferior turbinates and intact septum.  No purulence noted. Oral:  Oral cavity and oropharynx are intact, symmetric, without erythema or edema.  Mucosa is moist without lesions. Neck: Full range of motion without pain. A firm 3 cm right Level II neck mass is noted. Thyroid bed within normal limits to palpation.  Parotid glands and submandibular glands equal bilaterally without mass.  Trachea is midline. Neuro:  CN 2-12 grossly intact. Gait normal.   Procedure:  Flexible Fiberoptic Laryngoscopy Risks, benefits, and alternatives of flexible endoscopy were explained to the patient.  Specific mention was made of the risk of throat numbness with difficulty swallowing, possible bleeding from the nose and mouth, and pain from the procedure.  The patient gave oral consent to proceed.  The nasal cavities were decongested and anesthetised with a combination of oxymetazoline and 4% lidocaine solution.  The flexible scope was inserted into the right nasal cavity and advanced towards the nasopharynx.  Visualized mucosa over the turbinates and septum were as described above.  The nasopharynx was clear.  Oropharynx has a fungating 3 cm soft tissue mass on the right tongue base.  Hypopharynx was without lesion or edema.  Larynx was mobile without lesions. Supraglottic structures were free of edema, mass, and asymmetry.  True vocal folds were white without mass or lesion.    Assessment 1.  The patient is noted to  have a fungating 3 cm soft tissue mass on the right tongue on today's laryngoscopy examination.  2.  A firm 3 cm right Level II neck mass is also noted today.  3.  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  4.  Incidental findings of bilateral cerumen impaction.   Plan  1.  Otomicroscopy with bilateral cerumen disimpaction.  2.  The physical exam findings and the CT results are extensively discussed with the patient and his wife.  3.  The treatment options are discussed.  At this point, the first step is to perform a direct laryngoscopy and biopsy of the mass.  Once a tissue diagnosis is obtained, the patient will likely need to undergo chemoradiation treatment.  4.  The risks, benefits and details of the biopsy procedure are discussed.   5.  He would like to proceed with the procedure as soon as possible.

## 2018-08-19 NOTE — Anesthesia Preprocedure Evaluation (Signed)
Anesthesia Evaluation  Patient identified by MRN, date of birth, ID band Patient awake    Reviewed: Allergy & Precautions, H&P , NPO status , Patient's Chart, lab work & pertinent test results  Airway Mallampati: II   Neck ROM: full    Dental   Pulmonary sleep apnea , former smoker,    breath sounds clear to auscultation       Cardiovascular hypertension,  Rhythm:regular Rate:Normal     Neuro/Psych PSYCHIATRIC DISORDERS Depression Dementia    GI/Hepatic   Endo/Other    Renal/GU      Musculoskeletal  (+) Arthritis ,   Abdominal   Peds  Hematology   Anesthesia Other Findings   Reproductive/Obstetrics                             Anesthesia Physical Anesthesia Plan  ASA: III  Anesthesia Plan: General   Post-op Pain Management:    Induction: Intravenous  PONV Risk Score and Plan: 2 and Ondansetron, Dexamethasone and Treatment may vary due to age or medical condition  Airway Management Planned: Oral ETT  Additional Equipment:   Intra-op Plan:   Post-operative Plan: Extubation in OR  Informed Consent: I have reviewed the patients History and Physical, chart, labs and discussed the procedure including the risks, benefits and alternatives for the proposed anesthesia with the patient or authorized representative who has indicated his/her understanding and acceptance.     Plan Discussed with: CRNA, Anesthesiologist and Surgeon  Anesthesia Plan Comments:         Anesthesia Quick Evaluation

## 2018-08-19 NOTE — Transfer of Care (Signed)
Immediate Anesthesia Transfer of Care Note  Patient: [name]  Procedure(s) Performed: DIRECT LARYNGOSCOPY (N/A Throat) BIOPSY OF TONGUE BASE MASS (N/A Throat)  Patient Location: PACU  Anesthesia Type:General  Level of Consciousness: awake  Airway & Oxygen Therapy: Patient Spontanous Breathing and Patient connected to face mask oxygen  Post-op Assessment: Report given to RN and Post -op Vital signs reviewed and stable  Post vital signs: Reviewed and stable  Last Vitals:  Vitals Value Taken Time  BP 132/87 08/19/2018 12:33 PM  Temp    Pulse 73 08/19/2018 12:34 PM  Resp 16 08/19/2018 12:34 PM  SpO2 100 % 08/19/2018 12:34 PM  Vitals shown include unvalidated device data.  Last Pain:  Vitals:   08/19/18 1006  TempSrc: Oral  PainSc: 0-No pain         Complications: No apparent anesthesia complications

## 2018-08-19 NOTE — Discharge Instructions (Addendum)
The patient may resume all his previous activities and diet. He will follow-up in my office in one week. ° ° ° °Post Anesthesia Home Care Instructions ° °Activity: °Get plenty of rest for the remainder of the day. A responsible individual must stay with you for 24 hours following the procedure.  °For the next 24 hours, DO NOT: °-Drive a car °-Operate machinery °-Drink alcoholic beverages °-Take any medication unless instructed by your physician °-Make any legal decisions or sign important papers. ° °Meals: °Start with liquid foods such as gelatin or soup. Progress to regular foods as tolerated. Avoid greasy, spicy, heavy foods. If nausea and/or vomiting occur, drink only clear liquids until the nausea and/or vomiting subsides. Call your physician if vomiting continues. ° °Special Instructions/Symptoms: °Your throat may feel dry or sore from the anesthesia or the breathing tube placed in your throat during surgery. If this causes discomfort, gargle with warm salt water. The discomfort should disappear within 24 hours. ° °If you had a scopolamine patch placed behind your ear for the management of post- operative nausea and/or vomiting: ° °1. The medication in the patch is effective for 72 hours, after which it should be removed.  Wrap patch in a tissue and discard in the trash. Wash hands thoroughly with soap and water. °2. You may remove the patch earlier than 72 hours if you experience unpleasant side effects which may include dry mouth, dizziness or visual disturbances. °3. Avoid touching the patch. Wash your hands with soap and water after contact with the patch. °  ° °

## 2018-08-19 NOTE — Anesthesia Procedure Notes (Signed)
Procedure Name: Intubation Date/Time: 08/19/2018 12:07 PM Performed by: Genelle Bal, CRNA Pre-anesthesia Checklist: Patient identified, Emergency Drugs available, Suction available and Patient being monitored Patient Re-evaluated:Patient Re-evaluated prior to induction Oxygen Delivery Method: Circle system utilized Preoxygenation: Pre-oxygenation with 100% oxygen Induction Type: IV induction Ventilation: Mask ventilation without difficulty Laryngoscope Size: Glidescope and 3 Grade View: Grade I Tube type: Oral Tube size: 7.0 mm Number of attempts: 1 Airway Equipment and Method: Stylet and Oral airway Placement Confirmation: ETT inserted through vocal cords under direct vision,  positive ETCO2 and breath sounds checked- equal and bilateral Secured at: 23 cm Tube secured with: Tape Dental Injury: Teeth and Oropharynx as per pre-operative assessment  Difficulty Due To: Difficulty was unanticipated, Difficult Airway- due to anterior larynx and Difficult Airway- due to immobile epiglottis Future Recommendations: Recommend- induction with short-acting agent, and alternative techniques readily available Comments: DL x1 Mil 2 by S. Alford Highland CRNA, grade IV view, DLx1 Mac 3 by Hodierne MDA, grade IV view, Glide 3 x1, grade I view, ATOI 7.0 ETT, + BBS/etCo2, secured @ 23cm

## 2018-08-19 NOTE — Op Note (Signed)
DATE OF PROCEDURE:  08/19/2018                              OPERATIVE REPORT  SURGEON:  Leta Baptist, MD  PREOPERATIVE DIAGNOSES: Right tongue base mass  POSTOPERATIVE DIAGNOSES: Right tongue base mass  PROCEDURE PERFORMED:  Direct laryngoscopy with biopsy of the right tongue base mass.  ANESTHESIA:  General endotracheal tube anesthesia.  COMPLICATIONS:  None.  ESTIMATED BLOOD LOSS:  Minimal.  INDICATION FOR PROCEDURE:  [name] is a 73 y.o. male with a history of a right tongue base mass and a right level II neck mass. His CT findings were concerning for squamous cell carcinoma.  Based on the above findings, the decision was made for the patient to undergo the above-stated procedure.   The risks, benefits, alternatives, and details of the procedure were discussed with the patient.  Questions were invited and answered.  Informed consent was obtained.  DESCRIPTION:  The patient was taken to the operating room and placed supine on the operating table.  General endotracheal tube anesthesia was administered by the anesthesiologist.  The patient was positioned and prepped and draped in a standard fashion for direct laryngoscopy.  A Dedo laryngoscope was used for the examination. The laryngoscope was inserted via the oral cavity into the pharynx. Examination of the tongue base showed a 3 cm fungating mass. The epiglottis, Aryepiglottic folds, piriform sinuses, and vocal cords were all normal. Multiple biopsy specimens were obtained from the tongue base mass with a large cup forceps. The specimens were sent to the pathology department for permanent histologic identification. Hemostasis was achieved with pledgets soaked with epinephrine.  The care of the patient was turned over to the anesthesiologist.  The patient was awakened from anesthesia without difficulty.  The patient was extubated and transferred to the recovery room in good condition.  OPERATIVE FINDINGS:  A 3 cm right tongue base  mass.  SPECIMEN:  Biopsy specimens.  FOLLOWUP CARE:  The patient will be discharged home once awake and alert. The patient will follow up in my office in approximately 1 week.  [name] 08/19/2018 12:19 PM

## 2018-08-20 ENCOUNTER — Encounter (HOSPITAL_BASED_OUTPATIENT_CLINIC_OR_DEPARTMENT_OTHER): Payer: Self-pay | Admitting: Otolaryngology

## 2018-08-20 NOTE — Anesthesia Postprocedure Evaluation (Signed)
Anesthesia Post Note  Patient: [name]  Procedure(s) Performed: DIRECT LARYNGOSCOPY (N/A Throat) BIOPSY OF TONGUE BASE MASS (N/A Throat)     Patient location during evaluation: PACU Anesthesia Type: General Level of consciousness: awake and alert Pain management: pain level controlled Vital Signs Assessment: post-procedure vital signs reviewed and stable Respiratory status: spontaneous breathing, nonlabored ventilation, respiratory function stable and patient connected to nasal cannula oxygen Cardiovascular status: blood pressure returned to baseline and stable Postop Assessment: no apparent nausea or vomiting Anesthetic complications: no    Last Vitals:  Vitals:   08/19/18 1300 08/19/18 1345  BP: 126/80 125/79  Pulse: 83 63  Resp: 13 16  Temp:  36.7 C  SpO2: 92% 96%    Last Pain:  Vitals:   08/19/18 1345  TempSrc:   PainSc: 2                  Shalyn Koral S

## 2018-08-22 ENCOUNTER — Other Ambulatory Visit (HOSPITAL_COMMUNITY): Payer: Self-pay | Admitting: Otolaryngology

## 2018-08-22 DIAGNOSIS — R221 Localized swelling, mass and lump, neck: Secondary | ICD-10-CM

## 2018-09-03 ENCOUNTER — Other Ambulatory Visit: Payer: Self-pay | Admitting: Radiology

## 2018-09-04 ENCOUNTER — Ambulatory Visit (HOSPITAL_COMMUNITY)
Admission: RE | Admit: 2018-09-04 | Discharge: 2018-09-04 | Disposition: A | Discharge status: Home / Self Care | Payer: Medicare Other | Source: Ambulatory Visit | Attending: Otolaryngology | Admitting: Otolaryngology

## 2018-09-04 ENCOUNTER — Encounter (HOSPITAL_COMMUNITY): Payer: Self-pay

## 2018-09-04 ENCOUNTER — Other Ambulatory Visit: Payer: Self-pay

## 2018-09-04 DIAGNOSIS — F329 Major depressive disorder, single episode, unspecified: Secondary | ICD-10-CM | POA: Diagnosis not present

## 2018-09-04 DIAGNOSIS — Z859 Personal history of malignant neoplasm, unspecified: Secondary | ICD-10-CM | POA: Insufficient documentation

## 2018-09-04 DIAGNOSIS — H269 Unspecified cataract: Secondary | ICD-10-CM | POA: Insufficient documentation

## 2018-09-04 DIAGNOSIS — R221 Localized swelling, mass and lump, neck: Secondary | ICD-10-CM | POA: Diagnosis not present

## 2018-09-04 DIAGNOSIS — Z87891 Personal history of nicotine dependence: Secondary | ICD-10-CM | POA: Diagnosis not present

## 2018-09-04 DIAGNOSIS — Z8547 Personal history of malignant neoplasm of testis: Secondary | ICD-10-CM | POA: Diagnosis not present

## 2018-09-04 DIAGNOSIS — Z7901 Long term (current) use of anticoagulants: Secondary | ICD-10-CM | POA: Insufficient documentation

## 2018-09-04 DIAGNOSIS — Z79899 Other long term (current) drug therapy: Secondary | ICD-10-CM | POA: Insufficient documentation

## 2018-09-04 DIAGNOSIS — Z8249 Family history of ischemic heart disease and other diseases of the circulatory system: Secondary | ICD-10-CM | POA: Insufficient documentation

## 2018-09-04 DIAGNOSIS — E785 Hyperlipidemia, unspecified: Secondary | ICD-10-CM | POA: Insufficient documentation

## 2018-09-04 DIAGNOSIS — M1990 Unspecified osteoarthritis, unspecified site: Secondary | ICD-10-CM | POA: Diagnosis not present

## 2018-09-04 DIAGNOSIS — I10 Essential (primary) hypertension: Secondary | ICD-10-CM | POA: Diagnosis not present

## 2018-09-04 DIAGNOSIS — F0390 Unspecified dementia without behavioral disturbance: Secondary | ICD-10-CM | POA: Diagnosis not present

## 2018-09-04 LAB — PROTIME-INR
INR: 1.13
Prothrombin Time: 14.4 seconds (ref 11.4–15.2)

## 2018-09-04 LAB — CBC
HCT: 40.4 % (ref 39.0–52.0)
Hemoglobin: 13.3 g/dL (ref 13.0–17.0)
MCH: 30.3 pg (ref 26.0–34.0)
MCHC: 32.9 g/dL (ref 30.0–36.0)
MCV: 92 fL (ref 80.0–100.0)
Platelets: 192 10*3/uL (ref 150–400)
RBC: 4.39 MIL/uL (ref 4.22–5.81)
RDW: 11.8 % (ref 11.5–15.5)
WBC: 3.9 10*3/uL — AB (ref 4.0–10.5)
nRBC: 0 % (ref 0.0–0.2)

## 2018-09-04 MED ORDER — MIDAZOLAM HCL 2 MG/2ML IJ SOLN
INTRAMUSCULAR | Status: AC
  Filled 2018-09-04: qty 2

## 2018-09-04 MED ORDER — SODIUM CHLORIDE 0.9 % IV SOLN: INTRAVENOUS | Status: DC

## 2018-09-04 MED ORDER — MIDAZOLAM HCL 2 MG/2ML IJ SOLN
INTRAMUSCULAR | Status: AC | PRN
  Administered 2018-09-04: 0.5 mg via INTRAVENOUS
  Administered 2018-09-04: 1 mg via INTRAVENOUS

## 2018-09-04 MED ORDER — FENTANYL CITRATE (PF) 100 MCG/2ML IJ SOLN
INTRAMUSCULAR | Status: AC | PRN
  Administered 2018-09-04: 50 ug via INTRAVENOUS
  Administered 2018-09-04: 25 ug via INTRAVENOUS

## 2018-09-04 MED ORDER — LIDOCAINE HCL (PF) 1 % IJ SOLN
INTRAMUSCULAR | Status: AC
  Filled 2018-09-04: qty 30

## 2018-09-04 MED ORDER — FENTANYL CITRATE (PF) 100 MCG/2ML IJ SOLN
INTRAMUSCULAR | Status: AC
  Filled 2018-09-04: qty 2

## 2018-09-04 NOTE — Discharge Instructions (Addendum)
Needle Biopsy, Care After °These instructions tell you how to care for yourself after your procedure. Your doctor may also give you more specific instructions. Call your doctor if you have any problems or questions. °What can I expect after the procedure? °After the procedure, it is common to have: °· Soreness. °· Bruising. °· Mild pain. °Follow these instructions at home: ° °· Return to your normal activities as told by your doctor. Ask your doctor what activities are safe for you. °· Take over-the-counter and prescription medicines only as told by your doctor. °· Wash your hands with soap and water before you change your bandage (dressing). If you cannot use soap and water, use hand sanitizer. °· Follow instructions from your doctor about: °? How to take care of your puncture site. °? When and how to change your bandage. °? When to remove your bandage. °· Check your puncture site every day for signs of infection. Watch for: °? Redness, swelling, or pain. °? Fluid or blood.  °? Pus or a bad smell. °? Warmth. °· Do not take baths, swim, or use a hot tub until your doctor approves. Ask your doctor if you may take showers. You may only be allowed to take sponge baths. °· Keep all follow-up visits as told by your doctor. This is important. °Contact a doctor if you have: °· A fever. °· Redness, swelling, or pain at the puncture site, and it lasts longer than a few days. °· Fluid, blood, or pus coming from the puncture site. °· Warmth coming from the puncture site. °Get help right away if: °· You have a lot of bleeding from the puncture site. °Summary °· After the procedure, it is common to have soreness, bruising, or mild pain at the puncture site. °· Check your puncture site every day for signs of infection, such as redness, swelling, or pain. °· Get help right away if you have severe bleeding from your puncture site. °This information is not intended to replace advice given to you by your health care provider. Make  sure you discuss any questions you have with your health care provider. °Document Released: 07/06/2008 Document Revised: 08/06/2017 Document Reviewed: 08/06/2017 °Elsevier Interactive Patient Education © 2019 [employer] °Moderate Conscious Sedation, Adult, Care After °These instructions provide you with information about caring for yourself after your procedure. Your health care provider may also give you more specific instructions. Your treatment has been planned according to current medical practices, but problems sometimes occur. Call your health care provider if you have any problems or questions after your procedure. °What can I expect after the procedure? °After your procedure, it is common: °· To feel sleepy for several hours. °· To feel clumsy and have poor balance for several hours. °· To have poor judgment for several hours. °· To vomit if you eat too soon. °Follow these instructions at home: °For at least 24 hours after the procedure: ° °· Do not: °? Participate in activities where you could fall or become injured. °? Drive. °? Use heavy machinery. °? Drink alcohol. °? Take sleeping pills or medicines that cause drowsiness. °? Make important decisions or sign legal documents. °? Take care of children on your own. °· Rest. °Eating and drinking °· Follow the diet recommended by your health care provider. °· If you vomit: °? Drink water, juice, or soup when you can drink without vomiting. °? Make sure you have little or no nausea before eating solid foods. °General instructions °· Have a responsible adult stay   with you until you are awake and alert. °· Take over-the-counter and prescription medicines only as told by your health care provider. °· If you smoke, do not smoke without supervision. °· Keep all follow-up visits as told by your health care provider. This is important. °Contact a health care provider if: °· You keep feeling nauseous or you keep vomiting. °· You feel light-headed. °· You develop a  rash. °· You have a fever. °Get help right away if: °· You have trouble breathing. °This information is not intended to replace advice given to you by your health care provider. Make sure you discuss any questions you have with your health care provider. °Document Released: 05/14/2013 Document Revised: 12/27/2015 Document Reviewed: 11/13/2015 °Elsevier Interactive Patient Education © 2019 [employer] ° °

## 2018-09-04 NOTE — Procedures (Signed)
Necrotic rt neck adenopathy  S/p [place] asp and FNA   Samples for Cyto and cx No comp Stable EBL 0 Full report in pacs

## 2018-09-04 NOTE — H&P (Signed)
Chief Complaint: Patient was seen in consultation today for right neck mass biopsy at the request of Teoh,Su  Referring Physician(s): Teoh,Su  Supervising Physician: Arne Cleveland  Patient Status: [facility] - Out-pt  History of Present Illness: [name] is a 73 y.o. male   Hx testicular and prostate ca Recent wt loss  Pt noticed something felt as if he couldn't swallow completely Was evaluated by MD and sent to Dr Benjamine Mola for investigation of right tongue mass CT 12/31:  IMPRESSION: 1. 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 2. Enlarged necrotic lymph nodes at the right level 2 station measuring up to 3.4 cm, consistent with metastatic disease. 3. Pleural based nodule in the anterior left upper lobe likely represents scarring in the setting of centrilobular emphysema. Given mucosal malignancy, CT of the chest with contrast is recommended for further evaluation.  Bx 08/19/18: Tongue, biopsy, Base - SQUAMOUS PAPILLOMA WITH SEVERE SQUAMOUS DYSPLASIA.  Now scheduled for further evaluation Right neck mass biopsy  Past Medical History:  Diagnosis Date  . Allergy   . Arthritis    osteoarthritis  . Cancer [facility])    testicular, age 44, orchiectomy 75  . Cataract    B/L CATARACTS NO SURGERY  . Dementia ([place])   . Depression   . Dyslipidemia   . Heartburn    TAKES ZANTAC  . Hypertension   . Hypogonadism male   . Prostate cancer ([place]) 06/07/11   gleason 3+3=6, vol 59.5 cc  . Sleep apnea   . Status post chemotherapy    testicular cancer 1992, Dr Beryle Beams    Past Surgical History:  Procedure Laterality Date  . APPENDECTOMY    . COLONOSCOPY  2007   gessner  . DIRECT LARYNGOSCOPY N/A 08/19/2018   Procedure: DIRECT LARYNGOSCOPY;  Surgeon: Leta Baptist, MD;  Location: [place];  Service: ENT;  Laterality: N/A;  . INGUINAL HERNIA REPAIR     w/orchiectomy 1992,  retroperitoneal lymph node dissection  . MENISECTOMY     R&L knee surgeries  . PROSTATE SURGERY     biopsy x 2  . TONGUE BIOPSY N/A 08/19/2018   Procedure: BIOPSY OF TONGUE BASE MASS;  Surgeon: Leta Baptist, MD;  Location: [place];  Service: ENT;  Laterality: N/A;    Allergies: Patient has no known allergies.  Medications: Prior to Admission medications   Medication Sig Start Date End Date Taking? Authorizing Provider  acetaminophen (TYLENOL) 500 MG tablet Take 500 mg by mouth every 6 (six) hours as needed for moderate pain or headache.   Yes [provider]  aspirin EC 81 MG tablet Take 81 mg by mouth daily.   Yes [provider]  buPROPion (WELLBUTRIN SR) 150 MG 12 hr tablet TAKE 1 TABLET(150 MG) BY MOUTH TWICE DAILY Patient taking differently: Take 300 mg by mouth daily.  06/10/18  Yes Denita Lung, MD  cholecalciferol (VITAMIN D) 1000 UNITS tablet Take 2,000 Units by mouth daily.    Yes [provider]  donepezil (ARICEPT) 10 MG tablet Take 1 tablet (10 mg total) by mouth at bedtime. 07/16/18  Yes Tat, Eustace Quail, DO  dutasteride (AVODART) 0.5 MG capsule TAKE 1 CAPSULE(0.5 MG) BY MOUTH DAILY Patient taking differently: Take 0.5 mg by mouth daily.  11/02/17  Yes Denita Lung, MD  Multiple Vitamins-Minerals (MULTIVITAMIN WITH MINERALS) tablet Take 1 tablet by mouth daily.  Yes [provider]  Omega-3 Fatty Acids (FISH OIL BURP-LESS) 1200 MG CAPS Take 1,200 mg by mouth.   Yes [provider]  omeprazole (PRILOSEC) 40 MG capsule Take 1 capsule (40 mg total) by mouth daily. 08/15/18  Yes Denita Lung, MD  polyethylene glycol powder (GLYCOLAX/MIRALAX) powder Take 17 g by mouth daily. 08/24/14  Yes Denita Lung, MD  primidone (MYSOLINE) 50 MG tablet Take 1 tablet (50 mg total) by mouth 2 (two) times daily. 06/19/18  Yes Tat, Eustace Quail, DO  rosuvastatin (CRESTOR) 20 MG tablet Take 1 tablet (20 mg total) by mouth daily.  09/18/17  Yes Denita Lung, MD  terazosin (HYTRIN) 2 MG capsule TAKE 1 CAPSULE(2 MG) BY MOUTH DAILY 09/18/17  Yes Denita Lung, MD  triamcinolone (NASACORT ALLERGY 24HR) 55 MCG/ACT AERO nasal inhaler Place 1 spray into the nose daily.   Yes [provider]  TRINTELLIX 20 MG TABS tablet TAKE 1 TABLET BY MOUTH DAILY 01/14/18  Yes Denita Lung, MD     Family History  Problem Relation Age of Onset  . Lung cancer Mother   . Hypertension Mother   . Lung cancer Father   . Heart failure Father   . Hypertension Father     Social History   Socioeconomic History  . Marital status: Married    Spouse name: Not on file  . Number of children: 0  . Years of education: Not on file  . Highest education level: Not on file  Occupational History  . Occupation: RETIRED    Employer: [employer]  Social Needs  . Financial resource strain: Not on file  . Food insecurity:    Worry: Not on file    Inability: Not on file  . Transportation needs:    Medical: Not on file    Non-medical: Not on file  Tobacco Use  . Smoking status: Former Smoker    Packs/day: 1.00    Years: 20.00    Pack years: 20.00    Types: Cigarettes    Last attempt to quit: 08/03/1999    Years since quitting: 19.1  . Smokeless tobacco: Never Used  Substance and Sexual Activity  . Alcohol use: Not Currently    Alcohol/week: 6.0 standard drinks    Types: 6 Cans of beer per week    Comment: DAILY BEERS 2-3 12 OZ  . Drug use: No  . Sexual activity: Yes  Lifestyle  . Physical activity:    Days per week: Not on file    Minutes per session: Not on file  . Stress: Not on file  Relationships  . Social connections:    Talks on phone: Not on file    Gets together: Not on file    Attends religious service: Not on file    Active member of club or organization: Not on file    Attends meetings of clubs or organizations: Not on file    Relationship status: Not on file  Other Topics Concern  . Not on  file  Social History Narrative  . Not on file     Review of Systems: A 12 point ROS discussed and pertinent positives are indicated in the HPI above.  All other systems are negative.  Review of Systems  Constitutional: Positive for appetite change, fatigue and unexpected weight change. Negative for activity change.  HENT: Negative for sore throat.   Respiratory: Negative for cough and shortness of breath.   Gastrointestinal: Negative for abdominal  pain.  Neurological: Negative for weakness.  Psychiatric/Behavioral: Negative for behavioral problems and confusion.    Vital Signs: BP (!) 137/91   Pulse 73   Temp (!) 97.4 F (36.3 C) (Oral)   Resp 18   Ht 5\' 7"  (1.702 m)   Wt 145 lb (65.8 kg)   SpO2 97%   BMI 22.71 kg/m   Physical Exam Vitals signs reviewed.  Cardiovascular:     Rate and Rhythm: Normal rate and regular rhythm.     Heart sounds: Normal heart sounds.  Pulmonary:     Breath sounds: Normal breath sounds.  Abdominal:     General: Bowel sounds are normal.     Tenderness: There is no abdominal tenderness.  Musculoskeletal: Normal range of motion.  Skin:    General: Skin is warm and dry.  Neurological:     General: No focal deficit present.     Mental Status: He is alert and oriented to person, place, and time.  Psychiatric:        Mood and Affect: Mood normal.        Behavior: Behavior normal.        Thought Content: Thought content normal.        Judgment: Judgment normal.     Imaging: Ct Soft Tissue Neck W Contrast  Result Date: 08/06/2018 CLINICAL DATA:  Right-sided neck swelling over the last 6 months. EXAM: CT NECK WITH CONTRAST TECHNIQUE: Multidetector CT imaging of the neck was performed using the standard protocol following the bolus administration of intravenous contrast. CONTRAST:  24mL ISOVUE-300 IOPAMIDOL (ISOVUE-300) INJECTION 61% COMPARISON:  None. FINDINGS: Pharynx and larynx: An exophytic mass lesion is centered at the right tongue base,  at the glossal tonsillar sulcus. The lesion measures 3.4 x 2.2 x 1.7 cm. There is some fullness to the left posterior oropharynx is well without a discrete mass. Nasopharynx is within normal limits. Epiglottis and vallecula are within normal limits. The hypopharynx is clear. Vocal cords are midline and symmetric. Trachea is within normal limits. Salivary glands: The submandibular and parotid glands are normal bilaterally. Thyroid: Normal. Lymph nodes: A necrotic right level 2 lymph node measures 3.4 x 3.0 x 2.2 cm. An additional heterogeneous partially necrotic 1.6 cm node is present just inferiorly. Tiny level 3 lymph nodes are not clearly pathologic. No necrotic or pathologic left-sided nodes are present. Vascular: Atherosclerotic calcifications are present at the aortic arch and proximal descending thoracic aorta. Calcifications are also present at the carotid bifurcations bilaterally without a significant stenosis. Limited intracranial: A left posterior fossa arachnoid cyst is present with slight midline shift. Fourth ventricle is patent. This is likely congenital. Visualized orbits: Within normal limits. Mastoids and visualized paranasal sinuses: The paranasal sinuses and mastoid air cells are clear. Skeleton: Multilevel degenerative changes are present cervical spine. Slight degenerative anterolisthesis is present at C2-3. Grade 1 degenerative retrolisthesis present at C3-4, C4-5, and C5-6 with sclerotic endplate changes at each these levels. Sclerotic endplate changes are also present posteriorly at T2-3. No other focal lytic or blastic lesions are present. Upper chest: Centrilobular emphysematous changes are noted at the lung apices bilaterally. A pleural-based nodule anteriorly in the left lobe measures 5.5 mm. Interstitial scarring is present posteriorly in the right lung. IMPRESSION: 1. 3.4 x 2.2 x 1.7 cm mass lesion at the right glossal tonsillar sulcus/tongue base, most consistent with a squamous cell  carcinoma. 2. Enlarged necrotic lymph nodes at the right level 2 station measuring up to 3.4 cm, consistent with metastatic  disease. 3. Pleural based nodule in the anterior left upper lobe likely represents scarring in the setting of centrilobular emphysema. Given mucosal malignancy, CT of the chest with contrast is recommended for further evaluation. Electronically Signed   By: San Morelle M.D.   On: 08/06/2018 13:58   Ct Chest W Contrast  Result Date: 08/09/2018 CLINICAL DATA:  Cancer of the right tonsil. EXAM: CT CHEST WITH CONTRAST TECHNIQUE: Multidetector CT imaging of the chest was performed during intravenous contrast administration. CONTRAST:  25mL ISOVUE-300 IOPAMIDOL (ISOVUE-300) INJECTION 61% COMPARISON:  None. FINDINGS: Cardiovascular: The heart size is normal. No substantial pericardial effusion. Atherosclerotic calcification is noted in the wall of the thoracic aorta. Mediastinum/Nodes: No mediastinal lymphadenopathy. There is no hilar lymphadenopathy. There is no axillary lymphadenopathy. The esophagus has normal imaging features. Lungs/Pleura: The central tracheobronchial airways are patent. Centrilobular and paraseptal emphysema noted in the lungs bilaterally. Architectural distortion and areas of scarring are noted bilaterally. 4 mm peripheral nodular density in the left upper lobe (64/8) is likely scar. No focal airspace consolidation. No pleural effusion. Upper Abdomen: 11 mm low-density lesion in the posterior right liver (148/2) can not be definitively characterized but approaches water attenuation and is likely a cyst. Small calcified gallstones evident. Exophytic cyst noted upper pole left kidney. Musculoskeletal: No worrisome lytic or sclerotic osseous abnormality. Asymmetric gynecomastia noted on the right. IMPRESSION: 1. No definite findings to suggest metastatic disease in the thorax. 2. 4 mm peripheral nodular density in the left lung is probably scar but attention on  follow-up recommended. 3. 11 mm low-density lesion posterior right liver, likely benign and probably a cyst. This could also be reassessed at the time of follow-up imaging. 4. Cholelithiasis. 5.  Aortic Atherosclerois (ICD10-170.0) Electronically Signed   By: Misty Stanley M.D.   On: 08/09/2018 17:31    Labs:  CBC: Recent Labs    09/17/17 1148 07/26/18 1435  WBC 4.6 4.9  HGB 14.4 14.6  HCT 43.1 41.1  PLT 212 223    COAGS: No results for input(s): INR, APTT in the last 8760 hours.  BMP: Recent Labs    09/17/17 1148 07/26/18 1435  NA 142 140  K 4.6 4.7  CL 104 100  CO2 23 25  GLUCOSE 88 73  BUN 16 15  CALCIUM 9.8 9.6  CREATININE 0.79 0.89  GFRNONAA 92 87  GFRAA 106 100    LIVER FUNCTION TESTS: Recent Labs    09/17/17 1148 07/26/18 1435  BILITOT 0.8 0.5  AST 23 25  ALT 26 24  ALKPHOS 51 62  PROT 6.9 6.6  ALBUMIN 4.8 4.7    TUMOR MARKERS: No results for input(s): AFPTM, CEA, CA199, CHROMGRNA in the last 8760 hours.  Assessment and Plan:  Hx prostate/testicular cancer Right tongue base lesion: - SQUAMOUS PAPILLOMA WITH SEVERE SQUAMOUS DYSPLASIA. Scheduled now for right neck mass biopsy per Dr Benjamine Mola Risks and benefits discussed with the patient including, but not limited to bleeding, infection, damage to adjacent structures or low yield requiring additional tests.  All of the patient's questions were answered, patient is agreeable to proceed. Consent signed and in chart.   Thank you for this interesting consult.  I greatly enjoyed meeting [name] and look forward to participating in their care.  A copy of this report was sent to the requesting provider on this date.  Electronically Signed: Lavonia Drafts, PA-C 09/04/2018, 11:53 AM   I spent a total of  30 Minutes   in face to  face in clinical consultation, greater than 50% of which was counseling/coordinating care for right neck mass bx

## 2018-09-05 ENCOUNTER — Other Ambulatory Visit (HOSPITAL_COMMUNITY): Payer: Self-pay | Admitting: Otolaryngology

## 2018-09-05 ENCOUNTER — Other Ambulatory Visit: Payer: Self-pay | Admitting: Otolaryngology

## 2018-09-05 DIAGNOSIS — C760 Malignant neoplasm of head, face and neck: Secondary | ICD-10-CM

## 2018-09-06 ENCOUNTER — Other Ambulatory Visit: Payer: Self-pay | Admitting: Family Medicine

## 2018-09-06 DIAGNOSIS — F329 Major depressive disorder, single episode, unspecified: Secondary | ICD-10-CM

## 2018-09-06 DIAGNOSIS — E785 Hyperlipidemia, unspecified: Secondary | ICD-10-CM

## 2018-09-06 DIAGNOSIS — F32A Depression, unspecified: Secondary | ICD-10-CM

## 2018-09-06 NOTE — Telephone Encounter (Signed)
Is this ok to refill?  

## 2018-09-09 ENCOUNTER — Encounter: Payer: Self-pay | Admitting: *Deleted

## 2018-09-09 LAB — AEROBIC/ANAEROBIC CULTURE W GRAM STAIN (SURGICAL/DEEP WOUND): Special Requests: NORMAL

## 2018-09-09 LAB — AEROBIC/ANAEROBIC CULTURE (SURGICAL/DEEP WOUND): CULTURE: NO GROWTH

## 2018-09-11 ENCOUNTER — Telehealth: Payer: Self-pay | Admitting: *Deleted

## 2018-09-11 ENCOUNTER — Telehealth: Payer: Self-pay | Admitting: Hematology

## 2018-09-11 NOTE — Telephone Encounter (Signed)
Oncology Nurse Navigator Documentation  In follow-up to patient presentation at this morning's H&N Conference, called ENT Teoh's office, spoke with RN  Nira Conn. Relayed recommendation to refer patient to [facility] for TORS evaluation and if candidate to request p16 on surgical path.  If not a candidate for TORS, Dr. Benjamine Mola may need to bx further since uncertain if pending L79 can be determined on 1/29 FNA.  She confirmed understanding.  [name], RN, BSN Head & Neck Oncology Nurse [place] at [place] [phone]

## 2018-09-11 NOTE — Telephone Encounter (Signed)
SPOKE WITH PATIENT AND HIS WIFE TO CONFIRM NEW PATIENT APPT 09/19/18 AT 0830

## 2018-09-13 ENCOUNTER — Telehealth: Payer: Self-pay | Admitting: *Deleted

## 2018-09-13 NOTE — Telephone Encounter (Addendum)
Oncology Nurse Navigator Documentation  Placed introductory call to new referral patient [name].  Spoke with him and his wife on speaker phone.  Introduced myself as the H&N oncology nurse navigator that works with Drs. Isidore Moos and Maylon Peppers to whom he has been referred by Dr. Benjamine Mola.  Confirmed understanding of referral and explained I will be contacting them with appts.  Briefly explained my role as their navigator, indicated I would be joining them during upcoming consults.  I arranged to meet with them next Monday when he arrived for PET at [facility].  Provided my contact information, encouraged them to call with questions/concerns before next week.  They verbalized understanding of information provided, expressed appreciation for my call.  Navigator Needs Assessment . Employment status:  retired, flexible hours for appts . Support system:  wife . Transportation:  Has own car . PCP:  Jill Alexanders, aware of new dx  PCD: Dr. Noah Charon PCD, next appt 2/18.   Dr. Geralynn Ochs periodontist, sees regularly d/t hx of periodontal tmt.  Has own teeth, perception of overall dental condition - "B".  [name], RN, BSN Head & Neck Oncology Nurse [place] at [place] [phone]

## 2018-09-14 ENCOUNTER — Other Ambulatory Visit: Payer: Self-pay | Admitting: Family Medicine

## 2018-09-16 ENCOUNTER — Encounter: Payer: Self-pay | Admitting: *Deleted

## 2018-09-16 ENCOUNTER — Ambulatory Visit (HOSPITAL_COMMUNITY)
Admission: RE | Admit: 2018-09-16 | Discharge: 2018-09-16 | Disposition: A | Discharge status: Home / Self Care | Payer: Medicare Other | Source: Ambulatory Visit | Attending: Otolaryngology | Admitting: Otolaryngology

## 2018-09-16 DIAGNOSIS — C760 Malignant neoplasm of head, face and neck: Secondary | ICD-10-CM | POA: Diagnosis present

## 2018-09-16 DIAGNOSIS — C099 Malignant neoplasm of tonsil, unspecified: Secondary | ICD-10-CM | POA: Insufficient documentation

## 2018-09-16 LAB — GLUCOSE, CAPILLARY: GLUCOSE-CAPILLARY: 89 mg/dL (ref 70–99)

## 2018-09-16 MED ORDER — FLUDEOXYGLUCOSE F - 18 (FDG) INJECTION
7.3600 | Freq: Once | INTRAVENOUS | Status: AC | PRN
  Administered 2018-09-16: 7.36 via INTRAVENOUS

## 2018-09-16 NOTE — Progress Notes (Signed)
[facility] CONSULT NOTE  Patient Care Team: Denita Lung, MD as PCP - General (Family Medicine)  HEME/ONC OVERVIEW: 1. 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  CT showed an incidental right tonsillar mass and a enlarged right level 2 cervical lymph node.  He underwent DL with biopsy in 08/2018 by Dr. Benjamine Mola, which showed a 3 cm fungating mass in the base of the tongue, but unfortunately biopsy showed squamous papilloma with severe squamous dysplasia.  He then underwent ultrasound-guided FNA of the right cervical lymph node, which showed p16- squamous cell carcinoma.  PET in 09/2018 showed the known right tonsillar lesion and a single right Level II node measuring approximately 1.2 cm.  There was no contralateral lymph node involvement or evidence of metastatic disease. -I reviewed PET  images and results with the patient and his wife, as well as the diagnosis and NCCN guideline  -In the setting of localized H&N malignancy, the approach includes upfront surgical resection, followed by adjuvant therapy as indicated (RT vs. chemoradiation), or in unresectable cancer, definitive chemoradiation -While there is some discrepancy between the size of the right cervical lymph node on CT neck and PET, there appears to be only one suspicious R cervical LN on PET; if the final pathology after upfront resection shows negative margins and no extracapsular extension, the patient may be able to receive adjuvant RT alone and avoid chemotherapy  -The patient expressed understanding, and indicated that he would like to consider upfront surgical resection if he is a candidate  -Patient has been referred to ENT for consideration of TORS, currently scheduled on 09/25/2018 -Based on ENT evaluation, we will determine the next step   A total of more than 60 minutes were spent face-to-face with the patient during this encounter and over half of that time was spent on counseling and coordination of care as outlined above.    All questions were answered. The patient knows to call the clinic with any problems, questions or concerns.  Return to be determined, pending ENT and radiation oncology evaluation.   Tish Men, MD 09/19/2018 9:51 AM   CHIEF COMPLAINTS/PURPOSE OF CONSULTATION:  "I want to find out what next*"  HISTORY OF PRESENTING ILLNESS:  HILLARY SCHWEGLER 73 y.o. male is here because of newly diagnosed squamous cell carcinoma of the right tonsil.  Patient first presented to his PCP  in late 07/2018 for dry heaves, and was found with right-sided neck swelling. The dry heaving resolved with PPI, but due to the right-sided neck swelling, CT neck was done, which showed an incidental right tonsillar lesion and right level 2 suspicious lymph node.  He was referred to Dr. Benjamine Mola, who performed DL with a biopsy in  mid-08/2018, which showed a 3 cm fungating mass in the base of the tongue.  Unfortunately, the biopsy only showed squamous papilloma with severe squamous dysplasia.  He then underwent ultrasound-guided FNA of the right cervical lymph node, which showed squamous cell carcinoma, p16-. PET showed known R tonsillar malignancy with a single FDG-avid R-sided cervical LN.   Patient reports that he has occasional chills and has lost approximately 10 pounds over the past 6 weeks.  He describes his appetite as fair.  He has a history of tobacco use (half a pack to 1 pack/day x 20 years), but he quit smoking in 2000.  He used to drink up to 2 beers per day, but quit over the past few months.  He denies any other complaint today.  I have reviewed his chart and materials related to his cancer extensively and collaborated history with the patient. Summary of oncologic history is as follows:   Squamous cell carcinoma of right tonsil ([place])   08/06/2018 Imaging    CT neck w/ contrast: IMPRESSION: 1. 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 2. Enlarged necrotic lymph nodes at the right level 2 station measuring up to 3.4 cm, consistent with metastatic disease. 3. Pleural based nodule in the anterior left upper lobe likely represents scarring in the setting of centrilobular emphysema. Given mucosal malignancy, CT of the chest with contrast is recommended for further evaluation.    08/09/2018 Imaging    CT chest w/ contrast: IMPRESSION: 1. No definite findings to suggest metastatic disease in the thorax. 2. 4 mm peripheral nodular density in the left lung is probably scar but attention on follow-up recommended. 3. 11 mm low-density lesion posterior right liver, likely benign and probably a cyst. This could also be reassessed at the time of follow-up imaging. 4. Cholelithiasis. 5.  Aortic Atherosclerois (ICD10-170.0)     09/04/2018 Procedure    US-guided FNA of R cervical LN     09/04/2018 Pathology Results    Accession: [id number]  NECK, FINE NEEDLE ASPIRATION, RIGHT CERVICAL NECROTIC ADENOPATHY (SPECIMEN 1 OF 1 COLLECTED 09/04/2018) - MALIGNANT CELLS CONSISTENT WITH METASTATIC SQUAMOUS CELL CARCINOMA - SEE COMMENT  p16 immunohistochemistry was attempted on the cell block material. There are scattered clusters of squamous cell carcinoma that are NEGATIVE for p16.    09/16/2018 Initial Diagnosis    Squamous cell carcinoma of right tonsil ([place])    09/16/2018 Imaging    PET: IMPRESSION: 1. Focal activity in the RIGHT lingual tonsil consists with primary head and neck carcinoma. 2. Ipsilateral nodal metastasis to a  RIGHT level II lymph node. 3. No evidence of contralateral nodal metastasis. 4. No evidence distant metastatic disease. 5. LEFT upper lobe pulmonary nodule without metabolic activity. Recommend attention on follow-up. 6. Centrilobular emphysema the upper lobes.     MEDICAL HISTORY:  Past Medical History:  Diagnosis Date  . Allergy   . Arthritis    osteoarthritis  . Cancer [facility])    testicular, age 89, orchiectomy 34  . Cataract    B/L CATARACTS NO SURGERY  . Dementia ([place])   .  Depression   . Dyslipidemia   . Heartburn    TAKES ZANTAC  . Hypertension   . Hypogonadism male   . Prostate cancer ([place]) 06/07/11   gleason 3+3=6, vol 59.5 cc  . Sleep apnea   . Status post chemotherapy    testicular cancer 1992, Dr Beryle Beams    SURGICAL HISTORY: Past Surgical History:  Procedure Laterality Date  . APPENDECTOMY    . COLONOSCOPY  2007   [facility]ner  . DIRECT LARYNGOSCOPY N/A 08/19/2018   Procedure: DIRECT LARYNGOSCOPY;  Surgeon: Leta Baptist, MD;  Location: [facility];  Service: ENT;  Laterality: N/A;  . INGUINAL HERNIA REPAIR     w/orchiectomy 1992, retroperitoneal lymph node dissection  . MENISECTOMY     R&L knee surgeries  . PROSTATE SURGERY     biopsy x 2  .  TONGUE BIOPSY N/A 08/19/2018   Procedure: BIOPSY OF TONGUE BASE MASS;  Surgeon: Leta Baptist, MD;  Location: [facility];  Service: ENT;  Laterality: N/A;    SOCIAL HISTORY: Social History   Socioeconomic History  . Marital status: Married    Spouse name: Not on file  . Number of children: 0  . Years of education: Not on file  . Highest education level: Not on file  Occupational History  . Occupation: RETIRED    Employer: [employer]  Social Needs  . Financial resource strain: Not on file  . Food insecurity:    Worry: Not on file    Inability: Not on file  . Transportation needs:    Medical: Not on file    Non-medical: Not on file  Tobacco Use  . Smoking status: Former Smoker    Packs/day: 1.00    Years: 20.00    Pack years: 20.00    Types: Cigarettes    Last attempt to quit: 08/03/1999    Years since quitting: 19.1  . Smokeless tobacco: Never Used  Substance and Sexual Activity  . Alcohol use: Not Currently    Alcohol/week: 6.0 standard drinks    Types: 6 Cans of beer per week  . Drug use: No  . Sexual activity: Yes  Lifestyle  . Physical activity:    Days per week: Not on file    Minutes per session: Not on file  . Stress: Not on file  Relationships  . Social connections:    Talks on phone: Not on file    Gets together: Not on file    Attends religious service: Not on file    Active member of club or organization: Not on file    Attends meetings of clubs or organizations: Not on file    Relationship status: Not on file  . Intimate partner violence:    Fear of current or ex partner: Not on file    Emotionally abused: Not on file    Physically abused: Not on file    Forced sexual activity: Not on file  Other Topics Concern  . Not on file  Social History Narrative  . Not on file    FAMILY HISTORY: Family History  Problem Relation Age of Onset  . Lung cancer Mother   . Hypertension Mother   . Lung cancer Father   . Heart failure  Father   . Hypertension Father     ALLERGIES:  has No Known Allergies.  MEDICATIONS:  Current Outpatient Medications  Medication Sig Dispense Refill  . acetaminophen (TYLENOL) 500 MG tablet Take 500 mg by mouth every  6 (six) hours as needed for moderate pain or headache.    Marland Kitchen aspirin EC 81 MG tablet Take 81 mg by mouth daily.    Marland Kitchen buPROPion (WELLBUTRIN SR) 150 MG 12 hr tablet TAKE 1 TABLET(150 MG) BY MOUTH TWICE DAILY 180 tablet 0  . cholecalciferol (VITAMIN D) 1000 UNITS tablet Take 2,000 Units by mouth daily.     Marland Kitchen donepezil (ARICEPT) 10 MG tablet Take 1 tablet (10 mg total) by mouth at bedtime. 30 tablet 5  . dutasteride (AVODART) 0.5 MG capsule TAKE 1 CAPSULE(0.5 MG) BY MOUTH DAILY (Patient taking differently: Take 0.5 mg by mouth daily. ) 90 capsule 3  . Multiple Vitamins-Minerals (MULTIVITAMIN WITH MINERALS) tablet Take 1 tablet by mouth daily.      . Omega-3 Fatty Acids (FISH OIL BURP-LESS) 1200 MG CAPS Take 1,200 mg by mouth.    Marland Kitchen omeprazole (PRILOSEC) 40 MG capsule TAKE 1 CAPSULE(40 MG) BY MOUTH DAILY 30 capsule 0  . polyethylene glycol powder (GLYCOLAX/MIRALAX) powder Take 17 g by mouth daily. 3350 g 11  . primidone (MYSOLINE) 50 MG tablet Take 1 tablet (50 mg total) by mouth 2 (two) times daily. 180 tablet 1  . rosuvastatin (CRESTOR) 20 MG tablet TAKE 1 TABLET(20 MG) BY MOUTH DAILY 90 tablet 0  . sodium fluoride (PREVIDENT 5000 PLUS) 1.1 % CREA dental cream Apply cream to tooth brush. Brush teeth for 2 minutes. Spit out excess. DO NOT rinse afterwards. Repeat nightly. 1 Tube prn  . terazosin (HYTRIN) 2 MG capsule TAKE 1 CAPSULE(2 MG) BY MOUTH DAILY 90 capsule 3  . triamcinolone (NASACORT ALLERGY 24HR) 55 MCG/ACT AERO nasal inhaler Place 1 spray into the nose daily.    . TRINTELLIX 20 MG TABS tablet TAKE 1 TABLET BY MOUTH DAILY 90 tablet 1   No current facility-administered medications for this visit.     REVIEW OF SYSTEMS:   Constitutional: ( - ) fevers, ( + )  chills , ( - )  night sweats Eyes: ( - ) blurriness of vision, ( - ) double vision, ( - ) watery eyes Ears, nose, mouth, throat, and face: ( - ) mucositis, ( - ) sore throat Respiratory: ( - ) cough, ( - ) dyspnea, ( - ) wheezes Cardiovascular: ( - ) palpitation, ( - ) chest discomfort, ( - ) lower extremity swelling Gastrointestinal:  ( - ) nausea, ( - ) heartburn, ( - ) change in bowel habits Skin: ( - ) abnormal skin rashes Lymphatics: ( - ) new lymphadenopathy, ( - ) easy bruising Neurological: ( - ) numbness, ( - ) tingling, ( - ) new weaknesses Behavioral/Psych: ( - ) mood change, ( - ) new changes  All other systems were reviewed with the patient and are negative.  PHYSICAL EXAMINATION: ECOG PERFORMANCE STATUS: 0 - Asymptomatic  Vitals:   09/19/18 0856  BP: 131/71  Pulse: 75  Resp: 18  Temp: 98.2 F (36.8 C)  SpO2: 100%   Filed Weights   09/19/18 0856  Weight: 138 lb (62.6 kg)    GENERAL: alert, no distress and comfortable SKIN: skin color, texture, turgor are normal, no rashes or significant lesions EYES: conjunctiva are pink and non-injected, sclera clear OROPHARYNX: no exudate, no erythema; lips, buccal mucosa, and tongue normal  NECK: supple, non-tender LYMPH:  ~2-3cm right cervical LN, no other palpable cervical lymphadenopathy  LUNGS: clear to auscultation with normal breathing effort HEART: regular rate & rhythm, no murmurs, no lower extremity edema ABDOMEN: soft, non-tender,  non-distended, normal bowel sounds Musculoskeletal: no cyanosis of digits and no clubbing  PSYCH: alert & oriented x 3, fluent speech NEURO: no focal motor/sensory deficits  LABORATORY DATA:  I have reviewed the data as listed Lab Results  Component Value Date   WBC 3.5 (L) 09/19/2018   HGB 13.0 09/19/2018   HCT 38.8 (L) 09/19/2018   MCV 93.5 09/19/2018   PLT 199 09/19/2018   Lab Results  Component Value Date   NA 141 09/19/2018   K 4.2 09/19/2018   CL 104 09/19/2018   CO2 30 09/19/2018     RADIOGRAPHIC STUDIES: I have personally reviewed the radiological images as listed and agreed with the findings in the report. Nm Pet Image Initial (pi) Skull Base To Thigh  Result Date: 09/16/2018 CLINICAL DATA:  Initial treatment strategy for head neck carcinoma. EXAM: NUCLEAR MEDICINE PET SKULL BASE TO THIGH TECHNIQUE: 7.2 mCi F-18 FDG was injected intravenously. Full-ring PET imaging was performed from the skull base to thigh after the radiotracer. CT data was obtained and used for attenuation correction and anatomic localization. Fasting blood glucose: 3 80 mg/dl COMPARISON:  None. FINDINGS: Mediastinal blood pool activity: SUV max 1.9 NECK: focal activity within the RIGHT lingual tonsil region with associated soft tissue thickening activity. Hypermetabolic tissue is intense with SUV max equal 13.4. Activity does not cross midline. Focal activity associated with a single RIGHT level 2 lymph node beneath the sternocleidomastoid muscle measuring approximately 12 mm short axis with SUV max equal 4.9. No contralateral hypermetabolic nodes. Incidental CT findings: none CHEST: No hypermetabolic mediastinal or hilar nodes. No suspicious pulmonary nodules on the CT scan. Incidental CT findings: Centrilobular emphysema the upper lobes. Mild basilar atelectasis. LEFT upper lobe nodule along the pleural surface measures 4 mm (image 74/4) and does not associated metabolic activity ABDOMEN/PELVIS: No abnormal hypermetabolic activity within the liver, pancreas, adrenal glands, or spleen. No hypermetabolic lymph nodes in the abdomen or pelvis. Incidental CT findings: Low-density liver lesion RIGHT hepatic lobe is favored benign cysts. Several small gallstones. Simple cyst of the RIGHT kidney. Adrenal glands normal. SKELETON: No focal hypermetabolic activity to suggest skeletal metastasis. Incidental CT findings: none IMPRESSION: 1. Focal activity in the RIGHT lingual tonsil consists with primary head and neck  carcinoma. 2. Ipsilateral nodal metastasis to a  RIGHT level II lymph node. 3. No evidence of contralateral nodal metastasis. 4. No evidence distant metastatic disease. 5. LEFT upper lobe pulmonary nodule without metabolic activity. Recommend attention on follow-up. 6. Centrilobular emphysema the upper lobes. Electronically Signed   By: Suzy Bouchard M.D.   On: 09/16/2018 14:18   [place] Fna Soft Tissue  Result Date: 09/04/2018 INDICATION: Right cervical necrotic adenopathy, concern for right tonsillar mass by CT EXAM: Ultrasound right cervical necrotic adenopathy aspiration and FNA biopsy MEDICATIONS: 1% lidocaine local ANESTHESIA/SEDATION: Moderate (conscious) sedation was employed during this procedure. A total of Versed 1.5 mg and Fentanyl 75 mcg was administered intravenously. Moderate Sedation Time: 10 minutes. The patient's level of consciousness and vital signs were monitored continuously by radiology nursing throughout the procedure under my direct supervision. FLUOROSCOPY TIME:  Fluoroscopy Time: None. COMPLICATIONS: None immediate. PROCEDURE: Informed written consent was obtained from the patient after a thorough discussion of the procedural risks, benefits and alternatives. All questions were addressed. Maximal Sterile Barrier Technique was utilized including caps, mask, sterile gowns, sterile gloves, sterile drape, hand hygiene and skin antiseptic. A timeout was performed prior to the initiation of the procedure. Previous imaging reviewed. Preliminary ultrasound performed.  The necrotic right cervical adenopathy was localized and marked. Under sterile conditions and local anesthesia, ultrasound guidance was utilized to initially place an 18 gauge needle into the right cervical necrotic node. Syringe aspiration yielded 8 cc purulent fluid. Sample sent for culture and cytology. Additional 25 gauge FNA aspiration biopsies were performed of the collapsed necrotic node. Sample sent for cytology.  Postprocedure imaging demonstrates no hemorrhage or hematoma. Patient tolerated biopsy well. IMPRESSION: Successful ultrasound right cervical necrotic node aspiration and FNA biopsy. Sample sent for culture and cytology. Electronically Signed   By: Jerilynn Mages.  Shick M.D.   On: 09/04/2018 14:06    PATHOLOGY: I have reviewed the pathology reports as documented in the oncologist history.

## 2018-09-17 ENCOUNTER — Encounter (HOSPITAL_COMMUNITY): Payer: Self-pay | Admitting: Dentistry

## 2018-09-17 ENCOUNTER — Ambulatory Visit (HOSPITAL_COMMUNITY): Payer: Self-pay | Admitting: Dentistry

## 2018-09-17 VITALS — BP 119/81 | HR 67 | Temp 97.9°F

## 2018-09-17 DIAGNOSIS — M264 Malocclusion, unspecified: Secondary | ICD-10-CM

## 2018-09-17 DIAGNOSIS — K0889 Other specified disorders of teeth and supporting structures: Secondary | ICD-10-CM

## 2018-09-17 DIAGNOSIS — K036 Deposits [accretions] on teeth: Secondary | ICD-10-CM

## 2018-09-17 DIAGNOSIS — Z01818 Encounter for other preprocedural examination: Secondary | ICD-10-CM

## 2018-09-17 DIAGNOSIS — K030 Excessive attrition of teeth: Secondary | ICD-10-CM

## 2018-09-17 DIAGNOSIS — M270 Developmental disorders of jaws: Secondary | ICD-10-CM

## 2018-09-17 DIAGNOSIS — M2632 Excessive spacing of fully erupted teeth: Secondary | ICD-10-CM

## 2018-09-17 DIAGNOSIS — K06010 Localized gingival recession, unspecified: Secondary | ICD-10-CM

## 2018-09-17 DIAGNOSIS — K0530 Chronic periodontitis, unspecified: Secondary | ICD-10-CM

## 2018-09-17 DIAGNOSIS — M2630 Unspecified anomaly of tooth position of fully erupted tooth or teeth: Secondary | ICD-10-CM

## 2018-09-17 DIAGNOSIS — K031 Abrasion of teeth: Secondary | ICD-10-CM | POA: Diagnosis not present

## 2018-09-17 DIAGNOSIS — K08409 Partial loss of teeth, unspecified cause, unspecified class: Secondary | ICD-10-CM

## 2018-09-17 DIAGNOSIS — C099 Malignant neoplasm of tonsil, unspecified: Secondary | ICD-10-CM

## 2018-09-17 MED ORDER — SODIUM FLUORIDE 1.1 % DT CREA: TOPICAL_CREAM | DENTAL | 99 refills | Status: DC

## 2018-09-17 NOTE — Patient Instructions (Signed)
RADIATION THERAPY AND DECISIONS REGARDING YOUR TEETH  Xerostomia (dry mouth) Your salivary glands may be in the filed of radiation.  Radiation may include all or part of your saliva glands.  This will cause your saliva to dry up and you will have a dry mouth.  The dry mouth will be for the rest of your life unless your radiation oncologist tells you otherwise.  Your saliva has many functions:  Saliva wets your tongue for speaking.  It coats your teeth and the inside of your mouth for easier movement.  It helps with chewing and swallowing food.  It helps clean away harmful acid and toxic products made by the germs in your mouth, therefore it helps prevent cavities.  It kills some germs in your mouth and helps to prevent gum disease.  It helps to carry flavor to your taste buds.  Once you have lost your saliva you will be at higher risk for tooth decay and gum disease.  What can be done to help improve your mouth when there's not enough saliva:  1.  Your dentist may give a prescription for Salagen.  It will not bring back all of your saliva but may bring back some of it.  Also your saliva may be thick and ropy or white and foamy. It will not feel like it use to feel.  2.  You will need to swish with water every time your mouth feels dry.  YOU CANNOT suck on any cough drops, mints, lemon drops, candy, vitamin C or any other products.  You cannot use anything other than water to make your mouth feel less dry.  If you want to drink anything else you have to drink it all at once and brush afterwards.  Be sure to discuss the details of your diet habits with your dentist or hygienist.  Radiation caries: This is decay that happens very quickly once your mouth is very dry due to radiation therapy.  Normally cavities take six months to two years to become a problem.  When you have dry mouth cavities may take as little as eight weeks to cause you a problem.  This is why dental check ups every two  months are necessary as long as you have a dry mouth. Radiation caries typically, but not always, start at your gum line where it is hard to see the cavity.  It is therefore also hard to fill these cavities adequately.  This high rate of cavities happens because your mouth no longer has saliva and therefore the acid made by the germs starts the decay process.  Whenever you eat anything the germs in your mouth change the food into acid.  The acid then burns a small hole in your tooth.  This small hole is the beginning of a cavity.  If this is not treated then it will grow bigger and become a cavity.  The way to avoid this hole getting bigger is to use fluoride every evening as prescribed by your dentist.  You have to make sure that your teeth are very clean before you use the fluoride.  This fluoride in turn will strengthen your teeth and prepare them for another day of fighting acid.  If you develop radiation caries many times the damage is so large that you will have to have all your teeth removed.  This could be a big problem if some of these teeth are in the field of radiation.  Further details of why this could be   a big problem will follow.  (See Osteoradionecrosis).  Loss of taste (dysgeusia) This happens to varying degrees once you've had radiation therapy to your jaw region.  Many times taste is not completely lost but becomes limited.  The loss of taste is mostly due to radiation affecting your taste buds.  However if you have no saliva in your mouth to carry the flavor to your taste buds it would be difficult for your taste buds to taste anything.  That is why using water or a prescription for Salagen prior to meals and during meals may help with some of the taste.  Keep in mind that taste generally returns very slowly over the course of several months or several years after radiation therapy.  Don't give up hope.  Trismus According to your Radiation Oncologist your TMJ or jaw joints are going to be  partially or fully in the field of radiation.  This means that over time the muscles that help you open and close your mouth may get stiff.  This will potentially result in your not being able to open your mouth wide enough or as wide as you can open it now.  Le me give you an example of how slowly this happens and how unaware people are of it.  A gentlemen that had radiation therapy two years ago came back to me complaining that bananas are just too large for him to be able to fit them in between his teeth.  He was not able to open wide enough to bite into a banana.  This happens slowly and over a period of time.  What do we do to try and prevent this?  Your dentist will probably give you a stack of sticks called a trismus exercise device .  This stack will help your remind your muscles and your jaw joint to open up to the same distance every day.  Use these sticks every morning when you wake up according to the instructions given by the dentist.   You must use these sticks for at least one to two years after radiation therapy.  The reason for that is because it happens so slowly and keeps going on for about two years after radiation therapy.  Your hospital dentist will help you monitor your mouth opening and make sure that it's not getting smaller.  Osteoradionecrosis (ORN) This is a condition where your jaw bone after having had radiation therapy becomes very dry.  It has very little blood supply to keep it alive.  If you develop a cavity that turns into an abscess or an infection then the jaw bone does not have enough blood supply to help fight the infection.  At this point it is very likely that the infection could cause the death of your jaw bone.  When you have dead bone it has to be removed.  Therefore you might end up having to have surgery to remove part of your jaw bone, the part of the jaw bone that has been affected.   Healing is also a problem if you are to have surgery in the areas where the bone  has had radiation therapy.  The same reasons apply.  If you have surgery you need more blood supply which is not available.  When blood supply and oxygen are not available again, there is a chance for the bone to die.  Occasionally ORN happens on its own with no obvious reason.  This is quite rare.  We believe that   patients who continue to smoke and/or drink alcohol have a higher chance of having this bone problem.  Therefore once your jaw bone has had radiation therapy if there are any teeth in that area, you should never have them pulled.  You should also never have any surgery on your teeth or gums in that area unless the oral surgeon or Periodontist is aware of your history of radiation. There is some expensive management techniques that might be used to limit your risks.  The risks for ORN either from infection or spontaneous ( or on it's own) are life long.    TRISMUS  Trismus is a condition where the jaw does not allow the mouth to open as wide as it usually does.  This can happen almost suddenly, or in other cases the process is so slow, it is hard to notice it-until it is too far along.  When the jaw joints and/or muscles have been exposed to radiation treatments, the onset of Trismus is very slow.  This is because the muscles are losing their stretching ability over a long period of time, as long as 2 YEARS after the end of radiation.  It is therefore important to exercise these muscles and joints.  TRISMUS EXERCISES   Stack of tongue depressors measuring the same or a little less than the last documented MIO (Maximum Interincisal Opening).  Secure them with a rubber band on both ends.  Place the stack in the patient's mouth, supporting the other end.  Allow 30 seconds for muscle stretching.  Rest for a few seconds.  Repeat 3-5 times  For all radiation patients, this exercise is recommended in the mornings and evenings unless otherwise instructed.  The exercise should be done for  a period of 2 YEARS after the end of radiation.  MIO should be checked routinely on recall dental visits by the general dentist or the hospital dentist.  The patient is advised to report any changes, soreness, or difficulties encountered when doing the exercises.   FLUORIDE TRAYS PATIENT INSTRUCTIONS    Obtain Prevident 5000 prescription from the pharmacy.  Don't be surprised if it needs to be ordered.  Be sure to let the pharmacy know when you are close to needing a new refill for them to have it ready for you without interruption of Fluoride use.  The best time to use your Fluoride is before bedtime.  You must brush your teeth very well and floss before using the Fluoride in order to get the best use out of the Fluoride treatments.  Place Fluoride gel in the tray and spread gel around in the tray with your finger or cotton tip applicator.  Place the tray on your lower teeth and your upper teeth.  Make sure the trays are seated all the way.  Remember, they only fit one way on your teeth.  Insert for 5 full minutes.  At the end of the 5 minutes, take the trays out.  SPIT OUT excess.   Do NOT rinse your mouth!  Do NOT eat or drink after treatments for at least 30 minutes.  This is why the best time for your treatments is before bedtime.  Clean the inside of your Fluoride trays using COLD WATER and a toothbrush.  In order to keep your Trays from discoloring and free from odors, soak them overnight in denture cleaners such as Efferdent.  Do not use bleach or non denture products.  Store the trays in a safe dry place AWAY   from any heat until your next treatment.  If anything happens to your Fluoride trays, or they don't fit as well after any dental work, please let us know as soon as possible.  

## 2018-09-17 NOTE — Progress Notes (Signed)
Oncology Nurse Navigator Documentation  Met with pt and wife prior to and after his PET, further introduced myself. Provided Epic appt calendar, reviewed upcoming appts. They reported appt with Dr. Nicolette Bang 2/19 10:00, [facility]. I provided overview of tmt options, answered their questions. Escorted them to Dental Medicine where pt given 2/11 11:00 appt. I encouraged them to call me with questions/concerns.  [name], RN, BSN Head & Neck Oncology Nurse [place] [facility] [place] [facility] [phone]

## 2018-09-17 NOTE — Progress Notes (Signed)
DENTAL CONSULTATION  Date of Consultation:  09/17/2018 Patient Name:   [name] Date of Birth:   1946/01/18 Medical Record Number: [id number]  VITALS: BP 119/81 (BP Location: Right Arm)   Pulse 67   Temp 97.9 F (36.6 C)   CHIEF COMPLAINT: Patient referred by Dr. Isidore Moos and Dr. Maylon Peppers for a dental consultation.  HPI: Patient recently diagnosed with head and neck cancer involving the right tonsil/base of tongue. Patient with anticipated TORS procedure followed by postperative radiation therapy and possible chemotherapy.  The patient is now seen for a medically necessary pre-chemoradiation therapy dental protocol examination.  The patient currently denies acute toothaches, swellings, or abscesses. Patient was last seen by a dentist in March 19, 2018 for an examination and periodontal therapy.  The patient is seen on an every 3 month basis alternating between his primary dentist, Dr. Noah Charon, and his periodontist, Dr. Geralynn Ochs.  The patient denies having partial dentures. The patient denies having had occlusal splint therapy.  Patient denies having dental phobia.  PROBLEM LIST: Patient Active Problem List   Diagnosis Date Noted  . 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 Aortic atherosclerosis ([place]) 08/08/2018  . History of testicular cancer 08/08/2018  . Nausea and vomiting 07/26/2018  . Abdominal discomfort 07/26/2018  . Lymphadenitis 07/26/2018  . Mild cognitive impairment 09/11/2016  . Essential tremor 09/11/2016  . Hyperreflexia 06/08/2016  . Family history of heart disease in male family member before age 35 08/31/2015  . Depression 10/19/2014  . OSA (obstructive sleep apnea) 08/24/2014  . Hypertension   . Heartburn   . Arthritis   . History of prostate cancer 06/07/2011  . Hyperlipidemia 12/28/2010  . BPH (benign prostatic hypertrophy) with urinary retention 12/28/2010  . Hypogonadism male 12/28/2010    PMH: Past Medical  History:  Diagnosis Date  . Allergy   . Arthritis    osteoarthritis  . Cancer [facility])    testicular, age 34, orchiectomy 79  . Cataract    B/L CATARACTS NO SURGERY  . Dementia ([place])   . Depression   . Dyslipidemia   . Heartburn    TAKES ZANTAC  . Hypertension   . Hypogonadism male   . Prostate cancer ([place]) 06/07/11   gleason 3+3=6, vol 59.5 cc  . Sleep apnea   . Status post chemotherapy    testicular cancer 1992, Dr Beryle Beams    PSH: Past Surgical History:  Procedure Laterality Date  . APPENDECTOMY    . COLONOSCOPY  2007   [facility]  . DIRECT LARYNGOSCOPY N/A 08/19/2018   Procedure: DIRECT LARYNGOSCOPY;  Surgeon: Leta Baptist, MD;  Location: [facility];  Service: ENT;  Laterality: N/A;  . INGUINAL HERNIA REPAIR     w/orchiectomy 1992, retroperitoneal lymph node dissection  . MENISECTOMY     R&L knee surgeries  . PROSTATE SURGERY     biopsy x 2  . TONGUE BIOPSY N/A 08/19/2018   Procedure: BIOPSY OF TONGUE BASE MASS;  Surgeon: Leta Baptist, MD;  Location: [facility];  Service: ENT;  Laterality: N/A;    ALLERGIES: No Known Allergies  MEDICATIONS: Current Outpatient Medications  Medication Sig Dispense Refill  . acetaminophen (TYLENOL) 500 MG tablet Take 500 mg by mouth every 6 (six) hours as needed for moderate pain or headache.    Marland Kitchen aspirin EC 81 MG tablet Take 81 mg by mouth daily.    Marland Kitchen buPROPion (WELLBUTRIN SR) 150 MG 12 hr tablet  TAKE 1 TABLET(150 MG) BY MOUTH TWICE DAILY 180 tablet 0  . cholecalciferol (VITAMIN D) 1000 UNITS tablet Take 2,000 Units by mouth daily.     Marland Kitchen donepezil (ARICEPT) 10 MG tablet Take 1 tablet (10 mg total) by mouth at bedtime. 30 tablet 5  . dutasteride (AVODART) 0.5 MG capsule TAKE 1 CAPSULE(0.5 MG) BY MOUTH DAILY (Patient taking differently: Take 0.5 mg by mouth daily. ) 90 capsule 3  . Multiple Vitamins-Minerals (MULTIVITAMIN WITH MINERALS) tablet Take 1 tablet by mouth daily.      . Omega-3 Fatty Acids (FISH OIL  BURP-LESS) 1200 MG CAPS Take 1,200 mg by mouth.    Marland Kitchen omeprazole (PRILOSEC) 40 MG capsule TAKE 1 CAPSULE(40 MG) BY MOUTH DAILY 30 capsule 0  . polyethylene glycol powder (GLYCOLAX/MIRALAX) powder Take 17 g by mouth daily. 3350 g 11  . primidone (MYSOLINE) 50 MG tablet Take 1 tablet (50 mg total) by mouth 2 (two) times daily. 180 tablet 1  . rosuvastatin (CRESTOR) 20 MG tablet TAKE 1 TABLET(20 MG) BY MOUTH DAILY 90 tablet 0  . terazosin (HYTRIN) 2 MG capsule TAKE 1 CAPSULE(2 MG) BY MOUTH DAILY 90 capsule 3  . triamcinolone (NASACORT ALLERGY 24HR) 55 MCG/ACT AERO nasal inhaler Place 1 spray into the nose daily.    . TRINTELLIX 20 MG TABS tablet TAKE 1 TABLET BY MOUTH DAILY 90 tablet 1   No current facility-administered medications for this visit.     LABS: Lab Results  Component Value Date   WBC 3.9 (L) 09/04/2018   HGB 13.3 09/04/2018   HCT 40.4 09/04/2018   MCV 92.0 09/04/2018   PLT 192 09/04/2018      Component Value Date/Time   NA 140 07/26/2018 1435   K 4.7 07/26/2018 1435   CL 100 07/26/2018 1435   CO2 25 07/26/2018 1435   GLUCOSE 73 07/26/2018 1435   GLUCOSE 87 06/08/2016 1425   BUN 15 07/26/2018 1435   CREATININE 0.89 07/26/2018 1435   CREATININE 0.84 06/08/2016 1425   CALCIUM 9.6 07/26/2018 1435   GFRNONAA 87 07/26/2018 1435   GFRAA 100 07/26/2018 1435   Lab Results  Component Value Date   INR 1.13 09/04/2018   INR 1.0 01/30/2007   No results found for: PTT  SOCIAL HISTORY: Social History   Socioeconomic History  . Marital status: Married    Spouse name: Not on file  . Number of children: 0  . Years of education: Not on file  . Highest education level: Not on file  Occupational History  . Occupation: RETIRED    Employer: [employer]  Social Needs  . Financial resource strain: Not on file  . Food insecurity:    Worry: Not on file    Inability: Not on file  . Transportation needs:    Medical: Not on file    Non-medical: Not on file   Tobacco Use  . Smoking status: Former Smoker    Packs/day: 1.00    Years: 20.00    Pack years: 20.00    Types: Cigarettes    Last attempt to quit: 08/03/1999    Years since quitting: 19.1  . Smokeless tobacco: Never Used  Substance and Sexual Activity  . Alcohol use: Not Currently    Alcohol/week: 6.0 standard drinks    Types: 6 Cans of beer per week    Comment: DAILY BEERS 2-3 12 OZ  . Drug use: No  . Sexual activity: Yes  Lifestyle  . Physical activity:  Days per week: Not on file    Minutes per session: Not on file  . Stress: Not on file  Relationships  . Social connections:    Talks on phone: Not on file    Gets together: Not on file    Attends religious service: Not on file    Active member of club or organization: Not on file    Attends meetings of clubs or organizations: Not on file    Relationship status: Not on file  . Intimate partner violence:    Fear of current or ex partner: Not on file    Emotionally abused: Not on file    Physically abused: Not on file    Forced sexual activity: Not on file  Other Topics Concern  . Not on file  Social History Narrative  . Not on file    FAMILY HISTORY: Family History  Problem Relation Age of Onset  . Lung cancer Mother   . Hypertension Mother   . Lung cancer Father   . Heart failure Father   . Hypertension Father     REVIEW OF SYSTEMS: Reviewed with the patient as per History of present illness. Psych: Patient denies having dental phobia.  DENTAL HISTORY: CHIEF COMPLAINT: Patient referred by Dr. Isidore Moos and Dr. Maylon Peppers for a dental consultation.  HPI: Patient recently diagnosed with head and neck cancer involving the right tonsil/base of tongue. Patient with anticipated TORS procedure followed by postperative radiation therapy and possible chemotherapy.  The patient is now seen for a medically necessary pre-chemoradiation therapy dental protocol examination.  The patient currently denies acute toothaches,  swellings, or abscesses. Patient was last seen by a dentist in March 19, 2018 for an examination and periodontal therapy.  The patient is seen on an every 3 month basis alternating between his primary dentist, Dr. Noah Charon, and his periodontist, Dr. Geralynn Ochs.  The patient denies having partial dentures. The patient denies having had occlusal splint therapy.  Patient denies having dental phobia.   DENTAL EXAMINATION: GENERAL:  The patient is a well-developed, well-nourished male in no acute distress. HEAD AND NECK:  Patient has right neck lymphadenopathy. I do not palpate any left neck lymphadenopathy. The patient denies acute TMJ symptoms. Maximum interincisal opening is measured at 37 mm. INTRAORAL EXAM:  The patient has normal saliva. The patient has a deep palatal vault. Patient has small, bilateral mandibular lingual tori. There is no evidence of oral abscess formation. DENTITION:  Patient is missing tooth numbers 1, 16, 17, and 32. There is evidence of maxillary and mandibular anterior incisal attrition.  The patient has multiple malpositioned teeth. PERIODONTAL:  The patient has chronic periodontitis with plaque accumulations, generalized gingival recession, and generalized tooth mobility as per dental charting form. The tooth mobility of tooth #31 is class I+. The patient appears to have furcation involvement of multiple molars. DENTAL CARIES/SUBOPTIMAL RESTORATIONS:  No dental caries are noted. ENDODONTIC: patient currently denies acute pulpitis symptoms. I do not see any evidence of periapical pathology. Patient has had previous root canal therapies associated with tooth numbers 7 and 19. CROWN AND BRIDGE:  Patient has multiple crown restorations on tooth numbers 7, 12, 13, 14, 19, and 30. PROSTHODONTIC:  There are no partial dentures. OCCLUSION:The patient has a poor occlusal scheme secondary to missing teeth, diastema, and multiple malpositioned teeth. The occlusion, however, is  stable.  RADIOGRAPHIC INTERPRETATION: An orthopantogram was taken and supplemented with a full series of dental radiographs. There are missing tooth numbers 1, 16, 17,  and 32. There is moderate to severe bone loss. There is a diastema between tooth numbers 8 and 9. There are multiple malpositioned teeth.  There is evidence of maxillary and mandibular interincisal attrition. There are previous root canal therapies associated with tooth numbers 7 and 19. There are no other obvious periapical radiolucencies.  There are multiple dental and crown restorations.  There appears to be furcation involvement of several molar teeth.   ASSESSMENTS: 1. 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 Pre-chemoradiation therapy dental protocol 3. Chronic periodontitis with bone loss 4. Gingival recession 5. Accretions 6. Tooth mobility 7. Bilateral mandibular lingual tori 8. Maxillary and mandibular anterior incisal attrition 9. Multiple missing teeth 10. Multiple malpositioned teeth 11. Poor occlusal scheme but a stable occlusion   PLAN/RECOMMENDATIONS: 1. I discussed the risks, benefits, and complications of various treatment options with the patient in relationship to his medical and dental conditions, anticipated TORS procedure, anticipated radiation therapy and possible chemotherapy. We also discussed the possible chemoradiation therapy side effects to include xerostomia, radiation caries, trismus, mucositis, taste changes, gum and jawbone changes, and risk for infection and osteoradionecrosis. We discussed various treatment options to include no treatment, extraction of tooth number 2 and 31 due to proximity to the anticipated primary field of radiation therapy, alveoloplasty, pre-prosthetic surgery as indicated, periodontal therapy, dental restorations, root canal therapy, crown and bridge therapy, implant therapy, and replacement of missing teeth as indicated. We also discussed  fabrication of fluoride trays and scatter protection devices.  The patient currently wishes to proceed with continued consultations with Dr. Nicolette Bang, Dr. Maylon Peppers, and Dr. Isidore Moos before deciding on his dental plan of care. The patient did agree to proceed with impressions today for the future fabrication of fluoride trays and scatter protection devices. A prescription for PreviDent 5000 fluoride therapy was sent to the pharmacy of his choice with refills for one year. Patient is to initially use the PreviDent 5000 on his toothbrush at this time until fluoride trays can be fabricated and delivered to the patient.  Final disposition concerning extraction of 2 and 31 will be made after the consultations have been completed. We may consider referral to Dr. Desmond Dike at [facility] for extraction of the teeth as indicated if patient does proceed with TORS procedure with Dr. Nicolette Bang at [facility].   2. Discussion of findings with medical team and coordination of future medical and dental care as needed.  I spent in excess of  150 minutes during the conduct of this consultation and >50% of this time involved direct face-to-face encounter for counseling and/or coordination of the patient's care.    Lenn Cal, DDS

## 2018-09-18 ENCOUNTER — Ambulatory Visit (INDEPENDENT_AMBULATORY_CARE_PROVIDER_SITE_OTHER): Payer: Medicare Other | Admitting: Family Medicine

## 2018-09-18 ENCOUNTER — Encounter (HOSPITAL_COMMUNITY): Payer: Self-pay | Admitting: Dentistry

## 2018-09-18 ENCOUNTER — Encounter: Payer: Self-pay | Admitting: Family Medicine

## 2018-09-18 VITALS — BP 120/80 | HR 73 | Temp 98.1°F | Ht 66.0 in | Wt 135.0 lb

## 2018-09-18 DIAGNOSIS — C099 Malignant neoplasm of tonsil, unspecified: Secondary | ICD-10-CM

## 2018-09-18 DIAGNOSIS — G4733 Obstructive sleep apnea (adult) (pediatric): Secondary | ICD-10-CM

## 2018-09-18 DIAGNOSIS — Z0000 Encounter for general adult medical examination without abnormal findings: Secondary | ICD-10-CM | POA: Diagnosis not present

## 2018-09-18 DIAGNOSIS — Z8546 Personal history of malignant neoplasm of prostate: Secondary | ICD-10-CM

## 2018-09-18 DIAGNOSIS — F32A Depression, unspecified: Secondary | ICD-10-CM

## 2018-09-18 DIAGNOSIS — R12 Heartburn: Secondary | ICD-10-CM | POA: Diagnosis not present

## 2018-09-18 DIAGNOSIS — F329 Major depressive disorder, single episode, unspecified: Secondary | ICD-10-CM

## 2018-09-18 DIAGNOSIS — G3184 Mild cognitive impairment, so stated: Secondary | ICD-10-CM | POA: Diagnosis not present

## 2018-09-18 DIAGNOSIS — Z8547 Personal history of malignant neoplasm of testis: Secondary | ICD-10-CM

## 2018-09-18 NOTE — Progress Notes (Signed)
[name] is a 73 y.o. male who presents for annual wellness visit,CPE and follow-up on chronic medical conditions.  He is in the process for evaluation and treatment of tonsillar cancer.  He will have robotic surgery at [facility] within the next month.  He will also be seen by Dr. Casimiro Mathis to help with medication management concerning his underlying depression as well as mild cognitive impairment.  He continues have difficulty with heartburn and recently has lost some weight.  He does have OSA but has not used the CPAP stating he could not tolerate it.  He is a previous history of prostate cancer as well as testicular cancer however this is quite remote.   Immunizations and Health Maintenance Immunization History  Administered Date(s) Administered  . Influenza Split 04/18/2012, 05/06/2013, 05/27/2015  . Influenza Whole 04/12/2011  . Influenza-Unspecified 04/23/2014, 05/18/2016, 05/22/2017  . Pneumococcal Conjugate-13 08/31/2015  . Pneumococcal Polysaccharide-23 12/09/2009  . Tdap 01/17/2000, 07/25/2012  . Zoster 12/08/2008   Health Maintenance Due  Topic Date Due  . COLONOSCOPY  08/17/2016  . PNA vac Low Risk Adult (2 of 2 - PPSV23) 08/30/2016    Last colonoscopy: cologuard 10-31-16 Last PSA: 06-28-18 1.92 Dentist: 09-17-18 Ophtho:01/2018 Exercise: walking  Other doctors caring for patient include:Dr. Maylon Mathis oncology,  Dr. Benjamine Mathis ENT, Dr. Carles Mathis neuro. , Dr. Alinda Mathis urology  Advanced Directives:yes copy asked for    Depression screen:  See questionnaire below.     Depression screen [facility] 2/9 09/17/2017 09/11/2016 08/31/2015 08/24/2014 07/25/2012  Decreased Interest 2 3 3 3 3   Down, Depressed, Hopeless 2 1 3 3 3   PHQ - 2 Score 4 4 6 6 6   Altered sleeping 0 - 2 - 1  Tired, decreased energy 1 - 2 - 3  Change in appetite 2 - 3 - 1  Feeling bad or failure about yourself  1 - 0 - 0  Trouble concentrating 2 - 3 - 2  Moving slowly or fidgety/restless 0 - 0 - 0  Suicidal thoughts 0 - 0 - 0  PHQ-9  Score 10 - 16 - 13  Difficult doing work/chores Somewhat difficult - Somewhat difficult - -    Fall Screen: See Questionaire below.   Fall Risk  06/19/2018 12/11/2017 09/17/2017 09/11/2016 07/03/2016  Falls in the past year? 1 Yes No No No  Number falls in past yr: 1 1 - - -  Injury with Fall? 1 Yes - - -  Risk Factor Category  - High Fall Risk - - -  Risk for fall due to : History of fall(s);Impaired balance/gait;Mental status change Other (Comment) - - -  Follow up Falls evaluation completed Falls evaluation completed;Education provided;Falls prevention discussed - - -    ADL screen:  See questionnaire below.  Functional Status Survey:     Review of Systems  Constitutional: -, -unexpected weight change, -anorexia, -fatigue Allergy: -sneezing, -itching, -congestion Dermatology: denies changing moles, rash, lumps ENT: -runny nose, -ear pain, -sore throat,  Cardiology:  -chest pain, -palpitations, -orthopnea, Respiratory: -cough, -shortness of breath, -dyspnea on exertion, -wheezing,  Gastroenterology: -abdominal pain, -nausea, -vomiting, -diarrhea, -constipation, -dysphagia Hematology: -bleeding or bruising problems Musculoskeletal: -arthralgias, -myalgias, -joint swelling, -back pain, - Ophthalmology: -vision changes,  Urology: -dysuria, -difficulty urinating,  -urinary frequency, -urgency, incontinence Neurology: -, -numbness, , -memory loss, -falls, -dizziness    PHYSICAL EXAM:   General Appearance: Alert, cooperative, no distress, appears stated age Head: Normocephalic, without obvious abnormality, atraumatic Eyes: PERRL, conjunctiva/corneas clear, EOM's intact, fundi benign Ears: Normal  TM's and external ear canals Nose: Nares normal, mucosa normal, no drainage or sinus   tenderness Throat: Lips, mucosa, and tongue normal; teeth and gums normal Neck: Supple, no lymphadenopathy, thyroid:no enlargement/tenderness/nodules; no carotid bruit or JVD Lungs: Clear to  auscultation bilaterally without wheezes, rales or ronchi; respirations unlabored Heart: Regular rate and rhythm, S1 and S2 normal, no murmur, rub or gallop Abdomen: Soft, non-tender, nondistended, normoactive bowel sounds, no masses, no hepatosplenomegaly Extremities: No clubbing, cyanosis or edema Pulses: 2+ and symmetric all extremities Skin: Skin color, texture, turgor normal, no rashes or lesions Lymph nodes: Cervical, supraclavicular, and axillary nodes normal Neurologic: CNII-XII intact, normal strength, sensation and gait; reflexes 2+ and symmetric throughout   Psych: Normal mood, affect, hygiene and grooming  ASSESSMENT/PLAN: 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  Did strongly encourage him to use boost to help with his nutritional status.  I will work with him and be his advocate on an as-needed basis to get through this.  He is comfortable with that. Medicare Attestation I have personally reviewed: The patient's medical and social history Their use of alcohol, tobacco or illicit drugs Their current medications and supplements The patient's functional ability including ADLs,fall risks, home safety risks, cognitive, and hearing and visual impairment Diet and physical activities Evidence for depression or mood disorders  The patient's weight, height, and BMI have been recorded in the chart.  I have made referrals, counseling, and provided education to the patient based on review of the above and I have provided the patient with a written personalized care plan for preventive services.     Bruce Alexanders, MD   09/18/2018

## 2018-09-19 ENCOUNTER — Other Ambulatory Visit: Payer: Self-pay | Admitting: Hematology

## 2018-09-19 ENCOUNTER — Other Ambulatory Visit: Payer: Self-pay

## 2018-09-19 ENCOUNTER — Inpatient Hospital Stay: Payer: Medicare Other | Attending: Hematology | Admitting: Hematology

## 2018-09-19 ENCOUNTER — Encounter: Payer: Self-pay | Admitting: Hematology

## 2018-09-19 ENCOUNTER — Inpatient Hospital Stay: Payer: Medicare Other

## 2018-09-19 VITALS — BP 131/71 | HR 75 | Temp 98.2°F | Resp 18 | Ht 67.0 in | Wt 138.0 lb

## 2018-09-19 DIAGNOSIS — Z87891 Personal history of nicotine dependence: Secondary | ICD-10-CM | POA: Insufficient documentation

## 2018-09-19 DIAGNOSIS — C779 Secondary and unspecified malignant neoplasm of lymph node, unspecified: Secondary | ICD-10-CM | POA: Insufficient documentation

## 2018-09-19 DIAGNOSIS — M1990 Unspecified osteoarthritis, unspecified site: Secondary | ICD-10-CM

## 2018-09-19 DIAGNOSIS — K769 Liver disease, unspecified: Secondary | ICD-10-CM | POA: Diagnosis not present

## 2018-09-19 DIAGNOSIS — Z79899 Other long term (current) drug therapy: Secondary | ICD-10-CM | POA: Diagnosis not present

## 2018-09-19 DIAGNOSIS — R6883 Chills (without fever): Secondary | ICD-10-CM

## 2018-09-19 DIAGNOSIS — E785 Hyperlipidemia, unspecified: Secondary | ICD-10-CM | POA: Diagnosis not present

## 2018-09-19 DIAGNOSIS — K8020 Calculus of gallbladder without cholecystitis without obstruction: Secondary | ICD-10-CM

## 2018-09-19 DIAGNOSIS — C099 Malignant neoplasm of tonsil, unspecified: Secondary | ICD-10-CM

## 2018-09-19 DIAGNOSIS — J439 Emphysema, unspecified: Secondary | ICD-10-CM | POA: Insufficient documentation

## 2018-09-19 DIAGNOSIS — F329 Major depressive disorder, single episode, unspecified: Secondary | ICD-10-CM | POA: Insufficient documentation

## 2018-09-19 DIAGNOSIS — Z7982 Long term (current) use of aspirin: Secondary | ICD-10-CM | POA: Diagnosis not present

## 2018-09-19 DIAGNOSIS — E291 Testicular hypofunction: Secondary | ICD-10-CM

## 2018-09-19 DIAGNOSIS — Z8546 Personal history of malignant neoplasm of prostate: Secondary | ICD-10-CM | POA: Diagnosis not present

## 2018-09-19 DIAGNOSIS — Z801 Family history of malignant neoplasm of trachea, bronchus and lung: Secondary | ICD-10-CM | POA: Insufficient documentation

## 2018-09-19 DIAGNOSIS — Z9221 Personal history of antineoplastic chemotherapy: Secondary | ICD-10-CM | POA: Diagnosis not present

## 2018-09-19 DIAGNOSIS — R634 Abnormal weight loss: Secondary | ICD-10-CM | POA: Insufficient documentation

## 2018-09-19 DIAGNOSIS — I10 Essential (primary) hypertension: Secondary | ICD-10-CM | POA: Diagnosis not present

## 2018-09-19 DIAGNOSIS — F0390 Unspecified dementia without behavioral disturbance: Secondary | ICD-10-CM | POA: Diagnosis not present

## 2018-09-19 DIAGNOSIS — R911 Solitary pulmonary nodule: Secondary | ICD-10-CM | POA: Insufficient documentation

## 2018-09-19 DIAGNOSIS — Z8547 Personal history of malignant neoplasm of testis: Secondary | ICD-10-CM | POA: Diagnosis not present

## 2018-09-19 LAB — CBC WITH DIFFERENTIAL (CANCER CENTER ONLY)
Abs Immature Granulocytes: 0.01 10*3/uL (ref 0.00–0.07)
Basophils Absolute: 0 10*3/uL (ref 0.0–0.1)
Basophils Relative: 1 %
Eosinophils Absolute: 0.1 10*3/uL (ref 0.0–0.5)
Eosinophils Relative: 4 %
HCT: 38.8 % — ABNORMAL LOW (ref 39.0–52.0)
Hemoglobin: 13 g/dL (ref 13.0–17.0)
Immature Granulocytes: 0 %
Lymphocytes Relative: 25 %
Lymphs Abs: 0.9 10*3/uL (ref 0.7–4.0)
MCH: 31.3 pg (ref 26.0–34.0)
MCHC: 33.5 g/dL (ref 30.0–36.0)
MCV: 93.5 fL (ref 80.0–100.0)
Monocytes Absolute: 0.4 10*3/uL (ref 0.1–1.0)
Monocytes Relative: 13 %
Neutro Abs: 2 10*3/uL (ref 1.7–7.7)
Neutrophils Relative %: 57 %
Platelet Count: 199 10*3/uL (ref 150–400)
RBC: 4.15 MIL/uL — ABNORMAL LOW (ref 4.22–5.81)
RDW: 11.9 % (ref 11.5–15.5)
WBC: 3.5 10*3/uL — AB (ref 4.0–10.5)
nRBC: 0 % (ref 0.0–0.2)

## 2018-09-19 LAB — CMP (CANCER CENTER ONLY)
ALT: 18 U/L (ref 0–44)
AST: 19 U/L (ref 15–41)
Albumin: 4.4 g/dL (ref 3.5–5.0)
Alkaline Phosphatase: 52 U/L (ref 38–126)
Anion gap: 7 (ref 5–15)
BUN: 18 mg/dL (ref 8–23)
CO2: 30 mmol/L (ref 22–32)
Calcium: 9.7 mg/dL (ref 8.9–10.3)
Chloride: 104 mmol/L (ref 98–111)
Creatinine: 0.99 mg/dL (ref 0.61–1.24)
GFR, Est AFR Am: >60 mL/min (ref >60)
GFR, Estimated: >60 mL/min (ref >60)
Glucose, Bld: 82 mg/dL (ref 70–99)
POTASSIUM: 4.2 mmol/L (ref 3.5–5.1)
Sodium: 141 mmol/L (ref 135–145)
Total Bilirubin: 0.5 mg/dL (ref 0.3–1.2)
Total Protein: 6 g/dL — ABNORMAL LOW (ref 6.5–8.1)

## 2018-09-19 LAB — TSH: TSH: 2.057 u[IU]/mL (ref 0.320–4.118)

## 2018-09-19 LAB — T4, FREE: Free T4: 0.85 ng/dL (ref 0.82–1.77)

## 2018-09-19 NOTE — Progress Notes (Signed)
Head and Neck Cancer Location of Tumor / Histology:  08/19/18 Diagnosis Tongue, biopsy, Base - SQUAMOUS PAPILLOMA WITH SEVERE SQUAMOUS DYSPLASIA.  Patient presented with symptoms of:  Dr. Benjamine Mola documents: He first noted swallowing difficulty in 05/2018.  He was evaluated by Dorothea Ogle, PA-C, and was noted to have a right neck mass. He underwent a neck CT scan which showed a 3.4 cm exophytic mass at the right tongue base. In addition, he also has a necrotic right Level II lymph node measuring 3.4 cm in diameter.  Another 1.6 cm necrotic lymph node was present just inferiorly.  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   Biopsies of base of tongue revealed: Squamous papilloma with severe squamous dysplasia  Nutrition Status Yes No Comments  Weight changes? [x]  []  He has lost 10lbs about since diagnosis.  Swallowing concerns? []  [x]    PEG? []  [x]     Referrals Yes No Comments  Social Work? []  [x]    Dentistry? [x]  []  Dr. Enrique Sack 09/17/18  Swallowing therapy? []  [x]    Nutrition? []  [x]    Med/Onc? [x]  []  Dr. Maylon Peppers 09/19/18   Safety Issues Yes No Comments  Prior radiation? []  [x]    Pacemaker/ICD? []  [x]    Possible current pregnancy? []  [x]    Is the patient on methotrexate? []  [x]     Tobacco/Marijuana/Snuff/ETOH use: He is a former smoker quitting in 2000. He has not had alcohol since 07/2018.    Past/Anticipated interventions by otolaryngology, if any:  08/19/18 Dr. Benjamine Mola PROCEDURE PERFORMED:  Direct laryngoscopy with biopsy of the right tongue base mass.  Dr. Nicolette Bang 09/25/18 at [facility] for consideration of TORS.  09/25/18 Dr. 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 These include initiating treatment with surgery (followed by adjuvant therapy as indicated by pathology results) vs beginning with chemoradiotherapy (with surgery reserved for salvage). Pros and  cons of these approaches were discussed.   Surgery in this case would be: TORS resection of the primary tumor, bilateral neck dissection. If exposure or complete removal of the primary tumor proves difficult or impossible, then he may need conversion to an open transhyoid approach (unlikely).   It is likely that RT will be recommended postoperatively, but at adjuvant doses rather than treatment doses, and in about 2/3 of cases we can avoid chemotherapy.   Risks of this procedure have been discussed. These risks include but are not limited to, risks of anesthesia such as heart attack, stroke, death; risks of any surgery such as bleeding, infection, blood clots, pneumonia; risks of this particular surgery including risk of the TORS such as pain, difficulty swallowing with need for feeding tube which may be prolonged, airway swelling with need for prolonged intubation or tracheotomy placement, changes in speech, injury to the teeth, gums, lips, or tongue, inability to remove the entire tumor which may require conversion to an "open" procedure; risks of the neck dissection such as numbness, poor cosmetic appearance, weakness of nerves to the shoulder, lip, tongue, or larynx, chyle leak; risks of any tumor surgery, such as recurrence, need for further procedures or other treatments.   He meets with Dr Isidore Moos later this week and is interested to hear her opinion. His disease is certainly a good candidate for TORS/neck dissection. His dementia might make the postop recovery more difficult than we typically see. He and his wife seem to understand this. They will call with a decision about surgery vs ChemoRT  in the next few days.   He is agreeable with this plan. All his questions were answered.   Past/Anticipated interventions by medical oncology, if any:  Dr. 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  CT showed an incidental right tonsillar mass and a enlarged right level 2 cervical lymph node.  He underwent DL with biopsy in 08/2018 by Dr. Benjamine Mola, which showed a 3 cm fungating mass in the base of the tongue, but unfortunately biopsy showed squamous papilloma with severe squamous dysplasia.  He then underwent ultrasound-guided FNA of the right cervical lymph node, which showed p16- squamous cell carcinoma.  PET in 09/2018 showed the known right tonsillar lesion and a single right Level II node measuring approximately 1.2 cm.  There was no contralateral lymph node involvement or evidence of metastatic disease. -I reviewed PET images and results with the patient and his wife, as well as the diagnosis and NCCN guideline  -In the setting of localized H&N malignancy, the approach includes upfront surgical resection, followed by adjuvant therapy as indicated (RT vs. chemoradiation), or in unresectable cancer, definitive chemoradiation -While there is some discrepancy between the size of the right cervical lymph node on CT neck and PET, there appears to be only one suspicious R cervical LN on PET; if the final pathology after upfront resection shows negative margins and no extracapsular extension, the patient may be able to receive adjuvant RT alone and avoid chemotherapy  -The patient expressed understanding, and indicated that he would like to consider upfront surgical resection if he is a candidate  -Patient has been referred to ENT for consideration of TORS, currently scheduled on 09/25/2018 -Based on ENT evaluation, we will determine the next step    Current Complaints / other details:     BP  119/87 (BP Location: Right Arm)   Pulse 65   Temp 97.7 F (36.5 C) (Oral)   Ht 5\' 7"  (1.702 m)   Wt 138 lb 6.4 oz (62.8 kg)   SpO2 100% Comment: room air  BMI 21.68 kg/m    Wt Readings from Last 3 Encounters:  09/27/18 138 lb 6.4 oz (62.8 kg)  09/19/18 138 lb (62.6 kg)  09/18/18 135 lb (61.2 kg)

## 2018-09-25 DIAGNOSIS — C01 Malignant neoplasm of base of tongue: Secondary | ICD-10-CM | POA: Insufficient documentation

## 2018-09-26 ENCOUNTER — Telehealth: Payer: Self-pay | Admitting: *Deleted

## 2018-09-26 NOTE — Telephone Encounter (Signed)
Oncology Nurse Navigator Documentation  Spoke with Mr. and Mrs. Karlene Lineman.  They indicated yesterday's consult with Dr. Nicolette Bang informative/productive.  They confirmed their intent to arrive tomorrow 7:30 for NE followed by 8:00 Consult with Dr. Isidore Moos.  I encouraged them to call me if plans change, they agreed.  [name], RN, BSN Head & Neck Oncology Nurse [place] [facility] [place] [phone]

## 2018-09-27 ENCOUNTER — Other Ambulatory Visit: Payer: Self-pay

## 2018-09-27 ENCOUNTER — Encounter: Payer: Self-pay | Admitting: Radiation Oncology

## 2018-09-27 ENCOUNTER — Ambulatory Visit
Admission: RE | Admit: 2018-09-27 | Discharge: 2018-09-27 | Disposition: A | Discharge status: Home / Self Care | Payer: Medicare Other | Source: Ambulatory Visit | Attending: Radiation Oncology | Admitting: Radiation Oncology

## 2018-09-27 ENCOUNTER — Telehealth: Payer: Self-pay | Admitting: *Deleted

## 2018-09-27 ENCOUNTER — Encounter: Payer: Self-pay | Admitting: *Deleted

## 2018-09-27 VITALS — BP 119/87 | HR 65 | Temp 97.7°F | Ht 67.0 in | Wt 138.4 lb

## 2018-09-27 DIAGNOSIS — Z7982 Long term (current) use of aspirin: Secondary | ICD-10-CM | POA: Diagnosis not present

## 2018-09-27 DIAGNOSIS — I10 Essential (primary) hypertension: Secondary | ICD-10-CM | POA: Diagnosis not present

## 2018-09-27 DIAGNOSIS — M1990 Unspecified osteoarthritis, unspecified site: Secondary | ICD-10-CM | POA: Insufficient documentation

## 2018-09-27 DIAGNOSIS — Z87891 Personal history of nicotine dependence: Secondary | ICD-10-CM | POA: Diagnosis not present

## 2018-09-27 DIAGNOSIS — E119 Type 2 diabetes mellitus without complications: Secondary | ICD-10-CM | POA: Diagnosis not present

## 2018-09-27 DIAGNOSIS — F0280 Dementia in other diseases classified elsewhere without behavioral disturbance: Secondary | ICD-10-CM | POA: Diagnosis not present

## 2018-09-27 DIAGNOSIS — C01 Malignant neoplasm of base of tongue: Secondary | ICD-10-CM | POA: Diagnosis present

## 2018-09-27 DIAGNOSIS — Z9221 Personal history of antineoplastic chemotherapy: Secondary | ICD-10-CM | POA: Insufficient documentation

## 2018-09-27 DIAGNOSIS — Z79899 Other long term (current) drug therapy: Secondary | ICD-10-CM | POA: Insufficient documentation

## 2018-09-27 HISTORY — DX: Alzheimer's disease, unspecified: G30.9

## 2018-09-27 HISTORY — DX: Dementia in other diseases classified elsewhere without behavioral disturbance: F02.80

## 2018-09-27 MED ORDER — LARYNGOSCOPY SOLUTION RAD-ONC
15.0000 mL | Freq: Once | TOPICAL | Status: AC
  Administered 2018-09-27: 15 mL via TOPICAL
  Filled 2018-09-27: qty 15

## 2018-09-27 NOTE — Progress Notes (Signed)
Radiation Oncology         [phone] ________________________________  Initial outpatient Consultation  Name: RUTH TULLY MRN: [id number]  Date: 09/27/2018  DOB: [birth year]  VZ:CHYIFOY, Elyse Jarvis, MD  Leta Baptist, MD   REFERRING PHYSICIAN: Leta Baptist, MD  DIAGNOSIS:    ICD-10-CM   1. 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 Ambulatory referral to Social Work    Fiberoptic laryngoscopy    laryngocopy solution for Rad-Onc    Ambulatory referral to Social Work    Ambulatory referral to Physical Therapy    Amb Referral to Nutrition and Diabetic E    Referral to Neuro Rehab    Ambulatory referral to Social Work   Cancer Staging Malignant neoplasm of base of tongue ([place]) Staging form: Pharynx - P16 Negative Oropharynx, AJCC 8th Edition - Clinical stage from 09/27/2018: Stage IVA (cT2, cN2b, cM0, p16-) - Signed by Eppie Gibson, MD on 09/27/2018   CHIEF COMPLAINT: Here to discuss management of base of tongue cancer  HISTORY OF PRESENT ILLNESS::Vernal H Mcmichen is a 73 y.o. male who presented with difficulty swallowing in October 2019. He was seen by Dorothea Ogle, PA-C and had a right neck mass noted on exam.  Subsequently, the patient saw Dr. Benjamine Mola who performed a tongue biopsy on the patient.  Biopsy of base of tongue on 08/19/2018 revealed: squamous papilloma with severe squamous dysplasia.   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 There are scattered clusters of squamous cell carcinoma that are negative for p16.   Pertinent imaging thus far includes CT soft tissue neck performed on 08/06/2018 revealing: 3.4 x 2.2 x 1.7 cm mass lesion at the right glossal tonsillar sulcus/tongue base, most consistent with a squamous cell carcinoma. There were 2 enlarged necrotic lymph nodes  at the right level 2 station measuring up to 3.4 cm, consistent with metastatic disease. Pleural based nodule in the anterior left upper lobe likely represents scarring in the setting of centrilobular emphysema.    CT Chest on 08/09/2018 showed: No definite findings to suggest metastatic disease in the thorax. 4 mm peripheral nodular density in the left lung is probably scar but attention on follow-up recommended. 11 mm low-density lesion posterior right liver, likely benign and probably a cyst. Cholelithiasis.  Aortic Atherosclerois  PET scan on 09/16/2018 showed: Focal activity in the RIGHT lingual tonsil consists with primary head and neck carcinoma. Ipsilateral nodal metastasis to a  RIGHT level II lymph node. No evidence of contralateral nodal metastasis. No evidence distant metastatic disease. LEFT upper lobe pulmonary nodule without metabolic activity.  Centrilobular emphysema the upper lobes  He met with Dr. Enrique Sack on 09/17/2018 and Medical Oncologist Dr. Maylon Peppers on 09/19/2018. He also met with Dr. Nicolette Bang on 09/25/2018 at [facility] as well. He would like to have surgery and radiation follow up. Dr. Nicolette Bang discussed that surgery would occur on 10/16/2018. His wife notes that they will be moving to a townhouse on 3/5-3/6.   Swallowing issues, if any: He notes that he often feels like he is choking when he is swallowing.   Weight Changes: 10 lb weight loss since diagnosis  Pain status: N/A  Other symptoms: bowel issues (treated with miralax daily).   Tobacco history, if any: He is a former smoker who quit in 2000.   ETOH abuse, if any:  He hasn't consumed alcohol since December 2019.   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 He is unsure on where he had the chemotherapy went. He has been in remission since. He also has a hx of prostate cancer that is at [facility] with Dr. Alinda Money and he is having it  actively surveillance with annual biopsies and PSA levels.   He has a hx of tremors, depression (with medical treatment, however, it isn;t working well, he has a medication management appointment), and early stage alzheimer's. He used to work for [employer] for 33 yrs prior to retiring.     PREVIOUS RADIATION THERAPY: No  PAST MEDICAL HISTORY:  has a past medical history of Allergy, Alzheimer disease ([place]) (06/2018), Arthritis, Cancer ([place]), Cataract, Dementia ([place]), Depression, Dyslipidemia, Heartburn, Hypertension, Hypogonadism male, Prostate cancer ([place]) (06/07/11), Sleep apnea, and Status post chemotherapy.    PAST SURGICAL HISTORY: Past Surgical History:  Procedure Laterality Date  . APPENDECTOMY    . COLONOSCOPY  2007   [facility]  . DIRECT LARYNGOSCOPY N/A 08/19/2018   Procedure: DIRECT LARYNGOSCOPY;  Surgeon: Leta Baptist, MD;  Location: [place];  Service: ENT;  Laterality: N/A;  . INGUINAL HERNIA REPAIR     w/orchiectomy 1992, retroperitoneal lymph node dissection  . MENISECTOMY     R&L knee surgeries  . PROSTATE SURGERY     biopsy x 2  . TONGUE BIOPSY N/A 08/19/2018   Procedure: BIOPSY OF TONGUE BASE MASS;  Surgeon: Leta Baptist, MD;  Location: [place];  Service: ENT;  Laterality: N/A;    FAMILY HISTORY: family history includes Heart failure in his father; Hypertension in his father and mother; Lung cancer in his father and mother.  SOCIAL HISTORY:  reports that he quit smoking about 19 years ago. His smoking use included cigarettes. He has a 20.00 pack-year smoking history. He has never used smokeless tobacco. He reports previous alcohol use of about 6.0 standard drinks of alcohol per week. He reports that he does not use drugs.  ALLERGIES: Patient has no known allergies.  MEDICATIONS:  Current Outpatient Medications  Medication Sig Dispense Refill  . acetaminophen (TYLENOL) 500 MG tablet Take 500 mg by mouth every 6 (six) hours as  needed for moderate pain or headache.    Marland Kitchen aspirin EC 81 MG tablet Take 81 mg by mouth daily.    Marland Kitchen buPROPion (WELLBUTRIN SR) 150 MG 12 hr tablet TAKE 1 TABLET(150 MG) BY MOUTH TWICE DAILY 180 tablet 0  . cholecalciferol (VITAMIN D) 1000 UNITS tablet Take 2,000 Units by mouth daily.     Marland Kitchen donepezil (ARICEPT) 10 MG tablet Take 1 tablet (10 mg total) by mouth at bedtime. 30 tablet 5  . dutasteride (AVODART) 0.5 MG capsule TAKE 1 CAPSULE(0.5 MG) BY MOUTH DAILY (Patient taking differently: Take 0.5 mg by mouth daily. ) 90 capsule 3  . Multiple Vitamins-Minerals (MULTIVITAMIN WITH MINERALS) tablet Take 1 tablet by mouth daily.      . Omega-3 Fatty Acids (FISH OIL BURP-LESS) 1200 MG CAPS Take 1,200 mg by mouth.    Marland Kitchen omeprazole (PRILOSEC) 40 MG capsule TAKE 1 CAPSULE(40 MG) BY MOUTH DAILY 30 capsule 0  . polyethylene glycol powder (GLYCOLAX/MIRALAX) powder Take 17 g by mouth daily. 3350 g 11  . primidone (MYSOLINE) 50 MG tablet Take 1 tablet (50 mg total) by mouth 2 (two) times daily. 180 tablet 1  .  rosuvastatin (CRESTOR) 20 MG tablet TAKE 1 TABLET(20 MG) BY MOUTH DAILY 90 tablet 0  . sodium fluoride (PREVIDENT 5000 PLUS) 1.1 % CREA dental cream Apply cream to tooth brush. Brush teeth for 2 minutes. Spit out excess. DO NOT rinse afterwards. Repeat nightly. 1 Tube prn  . terazosin (HYTRIN) 2 MG capsule TAKE 1 CAPSULE(2 MG) BY MOUTH DAILY 90 capsule 3  . triamcinolone (NASACORT ALLERGY 24HR) 55 MCG/ACT AERO nasal inhaler Place 1 spray into the nose daily.    . TRINTELLIX 20 MG TABS tablet TAKE 1 TABLET BY MOUTH DAILY 90 tablet 1   No current facility-administered medications for this encounter.     REVIEW OF SYSTEMS: A 10+ POINT REVIEW OF SYSTEMS WAS OBTAINED including neurology, dermatology, psychiatry, cardiac, respiratory, lymph, extremities, GI, GU, Musculoskeletal, constitutional, breasts, reproductive, HEENT.  All pertinent positives are noted in the HPI.  All others are negative.    PHYSICAL  EXAM:  height is '5\' 7"'  (1.702 m) and weight is 138 lb 6.4 oz (62.8 kg). His oral temperature is 97.7 F (36.5 C). His blood pressure is 119/87 and his pulse is 65. His oxygen saturation is 100%.   General:  in no acute distress HEENT: Head is normocephalic. Extraocular movements are intact. Oropharynx is notable for no lesions in the upper throat. No lesions in the mouth.  Neck: Neck is notable for mass at the angle of the mandible right side, approximately 2 cm in greatest dimension. No other palpable masses in the neck.  Heart: Regular in rate and rhythm with no murmurs, rubs, or gallops. Chest: Clear to auscultation bilaterally, with no rhonchi, wheezes, or rales. Abdomen: Soft, nontender, nondistended, with no rigidity or guarding. Extremities: No cyanosis or edema. Lymphatics: see Neck Exam Skin: No concerning lesions. Musculoskeletal: symmetric strength and muscle tone throughout. Neurologic: Cranial nerves II through XII are grossly intact. No obvious focalities. Speech is fluent. Coordination is intact. Short term memory issues evident Psychiatric: Judgment and insight are intact. Affect is appropriate.   PROCEDURE NOTE: After obtaining consent and anesthetizing the nasal cavity with topical lidocaine and phenylephrine, the flexible endoscope was introduced and passed through the nasal cavity. Exophytic mass in the right base of tongue appears to just almost reach midline. True vocal cords are nice and symmetric and mobile. No other lesions appreciated in larynx or pharynx.   ECOG = 2  0 - Asymptomatic (Fully active, able to carry on all predisease activities without restriction)  1 - Symptomatic but completely ambulatory (Restricted in physically strenuous activity but ambulatory and able to carry out work of a light or sedentary nature. For example, light housework, office work)  2 - Symptomatic, <50% in bed during the day (Ambulatory and capable of all self care but unable to carry  out any work activities. Up and about more than 50% of waking hours)  3 - Symptomatic, >50% in bed, but not bedbound (Capable of only limited self-care, confined to bed or chair 50% or more of waking hours)  4 - Bedbound (Completely disabled. Cannot carry on any self-care. Totally confined to bed or chair)  5 - Death   Eustace Pen MM, Creech RH, Tormey DC, et al. [phone]). "Toxicity and response criteria of the [facility]". [place] Oncol. 5 (6): 649-55   LABORATORY DATA:  Lab Results  Component Value Date   WBC 3.5 (L) 09/19/2018   HGB 13.0 09/19/2018   HCT 38.8 (L) 09/19/2018   MCV 93.5 09/19/2018  PLT 199 09/19/2018   CMP     Component Value Date/Time   NA 141 09/19/2018 0757   NA 140 07/26/2018 1435   K 4.2 09/19/2018 0757   CL 104 09/19/2018 0757   CO2 30 09/19/2018 0757   GLUCOSE 82 09/19/2018 0757   BUN 18 09/19/2018 0757   BUN 15 07/26/2018 1435   CREATININE 0.99 09/19/2018 0757   CREATININE 0.84 06/08/2016 1425   CALCIUM 9.7 09/19/2018 0757   PROT 6.0 (L) 09/19/2018 0757   PROT 6.6 07/26/2018 1435   ALBUMIN 4.4 09/19/2018 0757   ALBUMIN 4.7 07/26/2018 1435   AST 19 09/19/2018 0757   ALT 18 09/19/2018 0757   ALKPHOS 52 09/19/2018 0757   BILITOT 0.5 09/19/2018 0757   GFRNONAA >60 09/19/2018 0757   GFRAA >60 09/19/2018 0757      Lab Results  Component Value Date   TSH 2.057 09/19/2018     RADIOGRAPHY: Nm Pet Image Initial (pi) Skull Base To Thigh  Result Date: 09/16/2018 CLINICAL DATA:  Initial treatment strategy for head neck carcinoma. EXAM: NUCLEAR MEDICINE PET SKULL BASE TO THIGH TECHNIQUE: 7.2 mCi F-18 FDG was injected intravenously. Full-ring PET imaging was performed from the skull base to thigh after the radiotracer. CT data was obtained and used for attenuation correction and anatomic localization. Fasting blood glucose: 3 80 mg/dl COMPARISON:  None. FINDINGS: Mediastinal blood pool activity: SUV max 1.9 NECK: focal activity  within the RIGHT lingual tonsil region with associated soft tissue thickening activity. Hypermetabolic tissue is intense with SUV max equal 13.4. Activity does not cross midline. Focal activity associated with a single RIGHT level 2 lymph node beneath the sternocleidomastoid muscle measuring approximately 12 mm short axis with SUV max equal 4.9. No contralateral hypermetabolic nodes. Incidental CT findings: none CHEST: No hypermetabolic mediastinal or hilar nodes. No suspicious pulmonary nodules on the CT scan. Incidental CT findings: Centrilobular emphysema the upper lobes. Mild basilar atelectasis. LEFT upper lobe nodule along the pleural surface measures 4 mm (image 74/4) and does not associated metabolic activity ABDOMEN/PELVIS: No abnormal hypermetabolic activity within the liver, pancreas, adrenal glands, or spleen. No hypermetabolic lymph nodes in the abdomen or pelvis. Incidental CT findings: Low-density liver lesion RIGHT hepatic lobe is favored benign cysts. Several small gallstones. Simple cyst of the RIGHT kidney. Adrenal glands normal. SKELETON: No focal hypermetabolic activity to suggest skeletal metastasis. Incidental CT findings: none IMPRESSION: 1. Focal activity in the RIGHT lingual tonsil consists with primary head and neck carcinoma. 2. Ipsilateral nodal metastasis to a  RIGHT level II lymph node. 3. No evidence of contralateral nodal metastasis. 4. No evidence distant metastatic disease. 5. LEFT upper lobe pulmonary nodule without metabolic activity. Recommend attention on follow-up. 6. Centrilobular emphysema the upper lobes. Electronically Signed   By: Suzy Bouchard M.D.   On: 09/16/2018 14:18   [place] Fna Soft Tissue  Result Date: 09/04/2018 INDICATION: Right cervical necrotic adenopathy, concern for right tonsillar mass by CT EXAM: Ultrasound right cervical necrotic adenopathy aspiration and FNA biopsy MEDICATIONS: 1% lidocaine local ANESTHESIA/SEDATION: Moderate (conscious) sedation was  employed during this procedure. A total of Versed 1.5 mg and Fentanyl 75 mcg was administered intravenously. Moderate Sedation Time: 10 minutes. The patient's level of consciousness and vital signs were monitored continuously by radiology nursing throughout the procedure under my direct supervision. FLUOROSCOPY TIME:  Fluoroscopy Time: None. COMPLICATIONS: None immediate. PROCEDURE: Informed written consent was obtained from the patient after a thorough discussion of the procedural risks, benefits and alternatives.  All questions were addressed. Maximal Sterile Barrier Technique was utilized including caps, mask, sterile gowns, sterile gloves, sterile drape, hand hygiene and skin antiseptic. A timeout was performed prior to the initiation of the procedure. Previous imaging reviewed. Preliminary ultrasound performed. The necrotic right cervical adenopathy was localized and marked. Under sterile conditions and local anesthesia, ultrasound guidance was utilized to initially place an 18 gauge needle into the right cervical necrotic node. Syringe aspiration yielded 8 cc purulent fluid. Sample sent for culture and cytology. Additional 25 gauge FNA aspiration biopsies were performed of the collapsed necrotic node. Sample sent for cytology. Postprocedure imaging demonstrates no hemorrhage or hematoma. Patient tolerated biopsy well. IMPRESSION: Successful ultrasound right cervical necrotic node aspiration and FNA biopsy. Sample sent for culture and cytology. Electronically Signed   By: Jerilynn Mages.  Shick M.D.   On: 09/04/2018 14:06      IMPRESSION/PLAN:  This is a delightful patient with head and neck cancer.  I would stage his disease as T2N2b considering the 2 necrotic nodes that were seen in the right neck on his CT scan.   The standard of care for his disease would be TORS followed by likely adjuvant radiotherapy  (hopefully chemotherapy would not be necessary adjuvantly); or, standard of care would be concurrent  chemoradiation.  We discussed that radiation alone could potentially be curative, however given some of the worrisome aspects of his disease this would not be an optimal treatment method.  We discussed these treatment options in great detail.  The patient's wife was present and she has many questions in addition to the patient asking many questions.  Patient has met with medical oncology and otolaryngology as well.  He is interested in pursuing TORS with the understanding that adjuvant therapy, likely in form of radiotherapy, would be necessary.  He will call Dr. Servando Salina office to schedule surgery as there is a Retail banker for March 11. [name], RN, our Head and Neck Oncology Navigator will contact her Tommie Raymond to let him know that the patient is interested in pursuing extractions if Dr. Desmond Dike can do this at the time of his surgery  We discussed the potential risks, benefits, and side effects of radiotherapy. We talked in detail about acute and late effects. We discussed that some of the most bothersome acute effects may be mucositis, dysgeusia, salivary changes, skin irritation, hair loss, dehydration, weight loss and fatigue. We talked about late effects which include but are not necessarily limited to dysphagia, hypothyroidism, nerve injury, spinal cord injury, xerostomia, trismus, and neck edema. No guarantees of treatment were given. A consent form was signed and placed in the patient's medical record. The patient is enthusiastic about proceeding with treatment. I look forward to participating in the patient's care.    Simulation (treatment planning) will take place at least 4 weeks following surgery. I anticipate that I will recommend 6-6.5 weeks of radiation therapy for this patient.   We also discussed that the treatment of head and neck cancer is a multidisciplinary process to maximize treatment outcomes and quality of life. For this reason the following referrals have been or will be  made:  Medical oncology to discuss chemotherapy, patient met with Dr. Maylon Peppers on 09/19/2018   Dentistry for dental evaluation, possible extractions in the radiation fields, and /or advice on reducing risk of cavities, osteoradionecrosis, or other oral issues. Patient met with Dr. Enrique Sack on 09/17/2018.   Nutritionist for nutrition support during and after treatment.  Speech language pathology for swallowing and/or speech therapy.  Social work for social support.   Physical therapy due to risk of lymphedema in neck and deconditioning.  [name], RN, our Head and Neck Oncology Navigator will set up a patient mentor who has a significant other who can help with the anxiety levels of the patient and the caregiver. Liliane Channel will also set up Multidisciplinary clinic for the patient to speak with all of the referrals at that time.   Patient would like to proceed with surgery with Dr. Nicolette Bang at [facility] on 10/16/2018 and then have radiation therapy.  __________________________________________   Eppie Gibson, MD   Time in: 8:05 AM Time out: 9:04 AM  I spent 60 minutes  face to face with the patient and more than 50% of that time was spent in counseling and/or coordination of care.  This document serves as a record of services personally performed by Eppie Gibson, MD. It was created on her behalf by Steva Colder, a trained medical scribe. The creation of this record is based on the scribe's personal observations and the provider's statements to them. This document has been checked and approved by the attending provider.

## 2018-09-28 NOTE — Telephone Encounter (Signed)
A user error has taken place: encounter opened in error, closed for administrative reasons.

## 2018-09-28 NOTE — Progress Notes (Signed)
Oncology Nurse Navigator Documentation  Met with [name] during initial consult with Dr. Isidore Moos.  He was accompanied by his wife.   . Further introduced myself as his Navigator, explained my role as a member of the Care Team.   . Provided introductory explanation of radiation treatment including SIM planning and purpose of Aquaplast head and shoulder mask, showed them example.   . They voiced understanding of tmt options (chemoRT vs TORS vs RT), indicated moving forward with TORS schedule with Dr. Nicolette Bang 3/11, [facility]. . They expressed: . Interest in patient mentoring support and referral to SW prior to surgery, voiced understanding I will coordinate. . Preference to have extractions recommended by Dr. Enrique Sack, Dirk Dress DM, to be conducted in conjunction with TORS.  They voiced understanding I will inform Dr. Raliegh Ip who will coordinate with [facility]. . They voiced understanding I will monitor his surgical process and coordinate return to see Dr. Isidore Moos as appropriate. . I encouraged them to contact me with questions/concerns as treatments/procedures begin.  They verbalized understanding of information provided.    [name], RN, BSN Head & Neck Oncology Nurse [place] [facility] [place] [phone]

## 2018-09-30 ENCOUNTER — Encounter (HOSPITAL_COMMUNITY): Payer: Self-pay | Admitting: Dentistry

## 2018-09-30 ENCOUNTER — Ambulatory Visit (HOSPITAL_COMMUNITY): Payer: Medicaid - Dental | Admitting: Dentistry

## 2018-09-30 VITALS — BP 135/90 | HR 71 | Temp 97.9°F

## 2018-09-30 DIAGNOSIS — K0530 Chronic periodontitis, unspecified: Secondary | ICD-10-CM

## 2018-09-30 DIAGNOSIS — M264 Malocclusion, unspecified: Secondary | ICD-10-CM

## 2018-09-30 DIAGNOSIS — C099 Malignant neoplasm of tonsil, unspecified: Secondary | ICD-10-CM

## 2018-09-30 DIAGNOSIS — K0889 Other specified disorders of teeth and supporting structures: Secondary | ICD-10-CM

## 2018-09-30 DIAGNOSIS — Z01818 Encounter for other preprocedural examination: Secondary | ICD-10-CM

## 2018-09-30 DIAGNOSIS — K08409 Partial loss of teeth, unspecified cause, unspecified class: Secondary | ICD-10-CM

## 2018-09-30 NOTE — Progress Notes (Signed)
09/30/2018  Patient:            [name] Date of Birth:  08-26-1945 MRN:                [id number]  BP 135/90 (BP Location: Left Arm)   Pulse 71   Temp 97.9 F (36.6 C)    [name] presented with his wife for discussion of dental treatment options. The patient indicated that he had follow-up consultations with Dr. Nicolette Bang, Dr. Isidore Moos, and Dr. Maylon Peppers. Patient indicates that he is going to follow up with Dr. Nicolette Bang for his TORS procedure with follow-up appointment on 10/16/2018 followed by surgery on 10/21/2018.  I then discussed the risks, benefits, complications associated with proceeding with extraction of tooth numbers 2 and 31 prior to anticipated radiation therapy. I discussed the presence of the periodontal disease and tooth mobility of tooth #31 with proximity to the anticipated radiation therapy. I then discussed the need for removal of tooth #2 as the opposing tooth with potential for supra-eruption and necessary extraction in the future. I then discussed my phone conversation with Dr.Kordsmeier and his recommendation for extraction of tooth #2, if tooth #31 was extracted.I then discussed the potential need for postoperative radiation therapy after the TORS procedure.  After all questions were answered, the patient and wife indicated they were in agreement with proceeding with extraction of tooth numbers 2 and 31 prior to radiation therapy.    The patient then expressed an interest in proceeding with dental extractions at the time of the TORS procedure.  We then reviewed the various extraction options to include referral to an oral surgeon, referral to Dr. Desmond Dike at [facility] for extractions prior to the TORS procedure, at the time of the TORS procedure, and after the TORS procedure.  I explained that I had contacted Dr. Desmond Dike concerning the dental surgery at the time of the TORS procedure and that she was in communication with Dr. Nicolette Bang concerning this option.  I then  explained the benefit of seeing Dr. Frederik Schmidt, oral surgeon, for evaluation for extraction at this time. I was able to secure an oral surgery consultation for tomorrow at 3:15 PM to discuss extraction of tooth numbers 2 an 31 with alveoloplasty. Patient and wife could then make a decision as to who they would have the extractions with.I then indicated I would contact them once further information from Dr. Desmond Dike was obtained concerning the ability to schedule the dental extractions at the time of the TORS procedure. Additional questions from the patient and wife were answered appropriately.   Lenn Cal, DDS

## 2018-10-02 ENCOUNTER — Encounter: Payer: Self-pay | Admitting: General Practice

## 2018-10-02 NOTE — Progress Notes (Signed)
[facility] Psychosocial Distress Screening Clinical Social Work  Clinical Social Work was referred by distress screening protocol.  The patient scored a 8 on the Psychosocial Distress Thermometer which indicates moderate distress. Clinical Social Worker contacted patient by phone to assess for distress and other psychosocial needs. Unable to reach patient by phone, left VM w information about [facility] and encouragement to reach out as needed for support/resources.    ONCBCN DISTRESS SCREENING 09/27/2018  Screening Type Initial Screening  Distress experienced in past week (1-10) 8  Emotional problem type Depression;Nervousness/Anxiety;Adjusting to illness  Physical Problem type Sleep/insomnia;Constipation/diarrhea;Skin dry/itchy    Clinical Social Worker follow up needed: No.  If yes, follow up plan:  Beverely Pace, [place], LCSW Clinical Social Worker Phone:  [phone]

## 2018-10-03 ENCOUNTER — Encounter (HOSPITAL_COMMUNITY): Payer: Self-pay | Admitting: Psychiatry

## 2018-10-03 ENCOUNTER — Ambulatory Visit (INDEPENDENT_AMBULATORY_CARE_PROVIDER_SITE_OTHER): Payer: Medicare Other | Admitting: Psychiatry

## 2018-10-03 VITALS — BP 126/80 | HR 82 | Ht 67.0 in | Wt 138.0 lb

## 2018-10-03 DIAGNOSIS — F339 Major depressive disorder, recurrent, unspecified: Secondary | ICD-10-CM

## 2018-10-03 MED ORDER — BUPROPION HCL ER (XL) 150 MG PO TB24: ORAL_TABLET | ORAL | 3 refills | Status: DC

## 2018-10-03 NOTE — Progress Notes (Signed)
Psychiatric Initial Adult Assessment   Patient Identification: [name] MRN:  [id number] Date of Evaluation:  10/03/2018 Referral Source: Dr. Wells Guiles Tat Chief Complaint:   Visit Diagnosis: Major depression mild  History of Present Illness:   This patient is a 73 year old married male who is been diagnosed with early onset Alzheimer's dementia.  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  He is upcoming surgery in the next 1 to 2 weeks.  The patient is seen for an evaluation of psychiatric illness related to his Alzheimer's.  The patient has a difficult time expressing directly that he is depressed.  His major focus symptom really is irritability.  This could be reflected in the form of his depression at his age.  His irritability also could be related to Alzheimer's dementia.  The patient has been on antidepressants for a while given by his primary care doctor starting 3 years ago.  They have seen minimal improvement.  The patient denies overt depression.  He does not appear depressed in this interview.  He is actually sleeping fair.  He has early morning awakening but does not take naps and does not feel sleepy.  His appetite is reduced and has had weight loss.  His energy level is very low and he has problems thinking and concentrating.  He acknowledges short-term memory which he attributes to his memory disorder.  He denies a feeling of worthlessness.  He is not suicidal now and never has been.  He is seen with his wife [name].  She and him have a hard time clearly describing an episode of major depression for the last few years.  The patient denies use of alcohol or drugs.  He denies ever having psychotic symptoms.  He denies any symptoms consistent with mania.  He denies symptoms consistent with generalized anxiety disorder, panic disorder or obsessive-compulsive disorder.  He has never seen a psychiatrist before and is never been in a psychiatric  hospital.  They have no children.  The patient still enjoys cats that he has an music.  The patient is been retired for over 15 years.  Financially he is stable.  The stressors also include the fact that in the next week he is going to be moving from one home in [place] to another home. Medical history includes hypertension hypercholesterolemia.  He is never had a seizure. Psychiatric history is significant for the fact that he is never seen a psychiatrist or been in a psychiatric hospital.  Presently from his primary care doctor he takes 20 mg of Trintellix and 300 mg of Wellbutrin.  The patient is never been in psychotherapy.  At this time the patient denies any new physical symptoms.  He denies chest pain shortness of breath or any neurological symptoms at this time.  Associated Signs/Symptoms: Depression Symptoms:  fatigue, (Hypo) Manic Symptoms:   Anxiety Symptoms:   Psychotic Symptoms:   PTSD Symptoms:   Past Psychiatric History: Trintellix, Wellbutrin  Previous Psychotropic Medications:   Substance Abuse History in the last 12 months:    Consequences of Substance Abuse:   Past Medical History:  Past Medical History:  Diagnosis Date  . Allergy   . Alzheimer disease ([place]) 06/2018   diagnosed with early stage alzheimers  . Arthritis    osteoarthritis  . Cancer [facility])    testicular, age 54, orchiectomy 73  . Cataract    B/L CATARACTS NO SURGERY  . Dementia ([place])   . Depression   .  Dyslipidemia   . Heartburn    TAKES ZANTAC  . Hypertension   . Hypogonadism male   . Prostate cancer ([place]) 06/07/11   gleason 3+3=6, vol 59.5 cc  . Sleep apnea   . Status post chemotherapy    testicular cancer 1992, Dr Beryle Beams    Past Surgical History:  Procedure Laterality Date  . APPENDECTOMY    . COLONOSCOPY  2007   gessner  . DIRECT LARYNGOSCOPY N/A 08/19/2018   Procedure: DIRECT LARYNGOSCOPY;  Surgeon: Leta Baptist, MD;  [facility];  Service: ENT;   Laterality: N/A;  . INGUINAL HERNIA REPAIR     w/orchiectomy 1992, retroperitoneal lymph node dissection  . MENISECTOMY     R&L knee surgeries  . PROSTATE SURGERY     biopsy x 2  . TONGUE BIOPSY N/A 08/19/2018   Procedure: BIOPSY OF TONGUE BASE MASS;  Surgeon: Leta Baptist, MD;  Location: [facility];  Service: ENT;  Laterality: N/A;    Family Psychiatric History:   Family History:  Family History  Problem Relation Age of Onset  . Lung cancer Mother   . Hypertension Mother   . Lung cancer Father   . Heart failure Father   . Hypertension Father     Social History:   Social History   Socioeconomic History  . Marital status: Married    Spouse name: Not on file  . Number of children: 0  . Years of education: Not on file  . Highest education level: Not on file  Occupational History  . Occupation: RETIRED    Employer: [employer]  Social Needs  . Financial resource strain: Not on file  . Food insecurity:    Worry: Not on file    Inability: Not on file  . Transportation needs:    Medical: No    Non-medical: No  Tobacco Use  . Smoking status: Former Smoker    Packs/day: 1.00    Years: 20.00    Pack years: 20.00    Types: Cigarettes    Last attempt to quit: 08/03/1999    Years since quitting: 19.1  . Smokeless tobacco: Never Used  Substance and Sexual Activity  . Alcohol use: Not Currently    Alcohol/week: 6.0 standard drinks    Types: 6 Cans of beer per week  . Drug use: No  . Sexual activity: Yes  Lifestyle  . Physical activity:    Days per week: Not on file    Minutes per session: Not on file  . Stress: Not on file  Relationships  . Social connections:    Talks on phone: Not on file    Gets together: Not on file    Attends religious service: Not on file    Active member of club or organization: Not on file    Attends meetings of clubs or organizations: Not on file    Relationship status: Not on file  Other Topics Concern  . Not on  file  Social History Narrative  . Not on file    Additional Social History:   Allergies:  No Known Allergies  Metabolic Disorder Labs: No results found for: HGBA1C, MPG No results found for: PROLACTIN Lab Results  Component Value Date   CHOL 146 09/17/2017   TRIG 63 09/17/2017   HDL 63 09/17/2017   CHOLHDL 2.3 09/17/2017   VLDL 18 09/11/2016   LDLCALC 70 09/17/2017   LDLCALC 67 09/11/2016   Lab Results  Component Value Date  TSH 2.057 09/19/2018    Therapeutic Level Labs: No results found for: LITHIUM No results found for: CBMZ No results found for: VALPROATE  Current Medications: Current Outpatient Medications  Medication Sig Dispense Refill  . acetaminophen (TYLENOL) 500 MG tablet Take 500 mg by mouth every 6 (six) hours as needed for moderate pain or headache.    Marland Kitchen aspirin EC 81 MG tablet Take 81 mg by mouth daily.    Marland Kitchen buPROPion (WELLBUTRIN SR) 150 MG 12 hr tablet TAKE 1 TABLET(150 MG) BY MOUTH TWICE DAILY 180 tablet 0  . cholecalciferol (VITAMIN D) 1000 UNITS tablet Take 2,000 Units by mouth daily.     Marland Kitchen donepezil (ARICEPT) 10 MG tablet Take 1 tablet (10 mg total) by mouth at bedtime. 30 tablet 5  . dutasteride (AVODART) 0.5 MG capsule TAKE 1 CAPSULE(0.5 MG) BY MOUTH DAILY (Patient taking differently: Take 0.5 mg by mouth daily. ) 90 capsule 3  . Multiple Vitamins-Minerals (MULTIVITAMIN WITH MINERALS) tablet Take 1 tablet by mouth daily.      . Omega-3 Fatty Acids (FISH OIL BURP-LESS) 1200 MG CAPS Take 1,200 mg by mouth.    Marland Kitchen omeprazole (PRILOSEC) 40 MG capsule TAKE 1 CAPSULE(40 MG) BY MOUTH DAILY 30 capsule 0  . polyethylene glycol powder (GLYCOLAX/MIRALAX) powder Take 17 g by mouth daily. 3350 g 11  . primidone (MYSOLINE) 50 MG tablet Take 1 tablet (50 mg total) by mouth 2 (two) times daily. 180 tablet 1  . rosuvastatin (CRESTOR) 20 MG tablet TAKE 1 TABLET(20 MG) BY MOUTH DAILY 90 tablet 0  . sodium fluoride (PREVIDENT 5000 PLUS) 1.1 % CREA dental cream Apply  cream to tooth brush. Brush teeth for 2 minutes. Spit out excess. DO NOT rinse afterwards. Repeat nightly. 1 Tube prn  . terazosin (HYTRIN) 2 MG capsule TAKE 1 CAPSULE(2 MG) BY MOUTH DAILY 90 capsule 3  . triamcinolone (NASACORT ALLERGY 24HR) 55 MCG/ACT AERO nasal inhaler Place 1 spray into the nose daily.    . TRINTELLIX 20 MG TABS tablet TAKE 1 TABLET BY MOUTH DAILY 90 tablet 1  . buPROPion (WELLBUTRIN XL) 150 MG 24 hr tablet 3   qam 90 tablet 3   No current facility-administered medications for this visit.     Musculoskeletal: Strength & Muscle Tone: within normal limits Gait & Station: normal Patient leans: N/A  Psychiatric Specialty Exam: ROS  Blood pressure 126/80, pulse 82, height 5\' 7"  (1.702 m), weight 138 lb (62.6 kg).Body mass index is 21.61 kg/m.  General Appearance: Casual  Eye Contact:  Good  Speech:  Normal Rate  Volume:  Normal  Mood:  Negative  Affect:  Congruent  Thought Process:  Goal Directed  Orientation:  Full (Time, Place, and Person)  Thought Content:  Logical  Suicidal Thoughts:  No  Homicidal Thoughts:  No  Memory:  NA  Judgement:  Good  Insight:  Fair  Psychomotor Activity:  Normal  Concentration:    Recall:  [place] of Knowledge:Good  Language: Good  Akathisia:  No  Handed:  Right  AIMS (if indicated):  not done  Assets:  Desire for Improvement Financial Resources/Insurance  ADL's:  Intact  Cognition: Impaired,  Moderate  Sleep:     Screenings: Mini-Mental     Office Visit from 06/08/2016 in [facility]  Total Score (max 30 points )  28    PHQ2-9     Office Visit from 09/18/2018 in [facility] Visit from 09/17/2017 in [facility]  Visit from 09/11/2016 in [facility] Visit from 08/31/2015 in [facility] Visit from 08/24/2014 in [facility]  PHQ-2 Total Score  6  4  4  6  6   PHQ-9 Total Score  19  10  -  16  -      Assessment and  Plan:   At this time the patient appears to be demonstrating early Alzheimer's dementia and the way he is functioning.  He no longer can drive he no longer cooks.  His wife takes care of the bills and the pills.  The patient however is very articulate, continues to have a sense of humor and is very appropriate.  He has reasonable questions about his care and his future.  The patient acknowledges he has problems finding the right words and does have short-term memory loss.  This is wife agrees with.  I suspect there are 2 likely psychiatric superimposed processes.  The first 1 to consider is major depression characterized by irritability a lack of appetite and energy.  He has classic early morning awakening typical of major depression.  At this time it is reasonable to continue his Trintellix at 20 mg and go ahead and increase his Wellbutrin to the maximum dose of 450 mg.  The patient will be having upcoming significant surgery and will moving from one home to another.  These are a number of major traumas and stresses that are coming his way.  I think the best way is to see him back in 7 weeks and reevaluate him.  The possibility of discontinuing his antidepressants and starting him on Remeron and/or consider using Depakote for his irritability could be considered.  The patient is functioning fairly well.  He has a very supportive wife.  He is a lot of stresses that are coming up.  He is not suicidal.  Jerral Ralph, MD 2/27/20203:08 PM

## 2018-10-10 ENCOUNTER — Ambulatory Visit (INDEPENDENT_AMBULATORY_CARE_PROVIDER_SITE_OTHER): Payer: Medicare Other | Admitting: Medical

## 2018-10-10 ENCOUNTER — Encounter: Payer: Self-pay | Admitting: Medical

## 2018-10-10 ENCOUNTER — Other Ambulatory Visit: Payer: Self-pay | Admitting: Medical

## 2018-10-10 ENCOUNTER — Ambulatory Visit
Admission: RE | Admit: 2018-10-10 | Discharge: 2018-10-10 | Disposition: A | Discharge status: Home / Self Care | Payer: Medicare Other | Source: Ambulatory Visit | Attending: Medical | Admitting: Medical

## 2018-10-10 VITALS — BP 110/80 | HR 79 | Temp 98.1°F | Ht 67.0 in | Wt 138.0 lb

## 2018-10-10 DIAGNOSIS — S301XXA Contusion of abdominal wall, initial encounter: Secondary | ICD-10-CM | POA: Diagnosis not present

## 2018-10-10 DIAGNOSIS — S20211A Contusion of right front wall of thorax, initial encounter: Secondary | ICD-10-CM

## 2018-10-10 DIAGNOSIS — S3011XA Contusion of abdominal wall, initial encounter: Secondary | ICD-10-CM

## 2018-10-10 DIAGNOSIS — S298XXA Other specified injuries of thorax, initial encounter: Secondary | ICD-10-CM

## 2018-10-10 LAB — POCT URINALYSIS DIP (PROADVANTAGE DEVICE)
Bilirubin, UA: NEGATIVE
Blood, UA: NEGATIVE
Glucose, UA: NEGATIVE mg/dL
Leukocytes, UA: NEGATIVE
Nitrite, UA: NEGATIVE
Protein Ur, POC: NEGATIVE mg/dL
Specific Gravity, Urine: 1.02
Urobilinogen, Ur: NEGATIVE
pH, UA: 6 (ref 5.0–8.0)

## 2018-10-10 NOTE — Progress Notes (Signed)
  Subjective:     Patient ID: [name], male   DOB: [birth year], 73 y.o.   MRN: [id number]  HPI Chief Complaint  Patient presents with  . Rib Injury    right,table and box fell on patient on Monday (moving)   Here with wife today.  Yesterday 10/09/2018 he was laying on the floor at home trying to remove a leg off of a piece of furniture when a 30 pound box fell on his right chest and abdomen.  Since then he has had some pain, some pain with breathing, pain in his abdomen, pain sitting up or lying down.  No shortness of breath, no wheezing, no blood in the urine or blood in the stool, no back pain, no problems with urination or defecation.  He is having surgery in about 11 days and wanted to make sure things checked out okay with this injury.  He will be having the neck mass removed and lymph node dissection. No other aggravating or relieving factors. No other complaint.  Review of Systems As in subjective    Objective:   Physical Exam BP 110/80   Pulse 79   Temp 98.1 F (36.7 C) (Oral)   Ht 5\' 7"  (1.702 m)   Wt 138 lb (62.6 kg)   SpO2 97%   BMI 21.61 kg/m   General appearance: alert, no distress, WD/WN, white male Heart: RRR, normal S1, S2, no murmurs Lungs: CTA bilaterally, no wheezes, rhonchi, or rales Mild tenderness of the inferior ribs on the right lateral chest wall but no bruising, no deformity inspiration and expiration normal Abdomen: +bs, soft, tender right upper and mid anterior lateral abdomen, pain with sitting up and lying down, otherwise no bruising, no swelling, otherwise non tender, non distended, no masses, no hepatomegaly, no splenomegaly Pulses: 2+ symmetric, upper and lower extremities, normal cap refill       Assessment:     Encounter Diagnoses  Name Primary?  . Contusion of rib on right side, initial encounter Yes  . Contusion of abdominal wall, initial encounter        Plan:     Urinalysis reviewed, no hematuria.  We will send for rib x-ray.   Symptoms and exam suggest rib and chest wall contusion, mild, without obvious soft tissue organ damage in the abdomen.  We discussed relative rest, incentive spirometry, and symptoms should gradually improve.  Advise if blood in the urine or blood in the stool or worse pain, worse trouble breathing, new symptoms, then get checked out right away.  Brevin was seen today for rib injury.  Diagnoses and all orders for this visit:  Contusion of rib on right side, initial encounter -     Cancel: DG Ribs Unilateral Right; Future -     DG Ribs Unilateral Right; Future  Contusion of abdominal wall, initial encounter -     Cancel: DG Ribs Unilateral Right; Future -     DG Ribs Unilateral Right; Future

## 2018-10-16 DIAGNOSIS — K219 Gastro-esophageal reflux disease without esophagitis: Secondary | ICD-10-CM | POA: Insufficient documentation

## 2018-10-31 MED ORDER — FINASTERIDE 5 MG PO TABS
5.00 | ORAL_TABLET | ORAL | Status: DC
Start: 2018-11-01 — End: 2018-10-31

## 2018-10-31 MED ORDER — GENERIC EXTERNAL MEDICATION
Status: DC
Start: 2018-10-31 — End: 2018-10-31

## 2018-10-31 MED ORDER — BUPROPION HCL ER (SR) 100 MG PO TB12
200.00 | ORAL_TABLET | ORAL | Status: DC
Start: 2018-10-31 — End: 2018-10-31

## 2018-10-31 MED ORDER — DONEPEZIL HCL 10 MG PO TABS
10.00 | ORAL_TABLET | ORAL | Status: DC
Start: 2018-10-31 — End: 2018-10-31

## 2018-10-31 MED ORDER — TERAZOSIN HCL 1 MG PO CAPS
2.00 | ORAL_CAPSULE | ORAL | Status: DC
Start: 2018-11-01 — End: 2018-10-31

## 2018-10-31 MED ORDER — OXYCODONE-ACETAMINOPHEN 5-325 MG PO TABS
1.00 | ORAL_TABLET | ORAL | Status: DC
Start: ? — End: 2018-10-31

## 2018-10-31 MED ORDER — GENERIC EXTERNAL MEDICATION
Status: DC
Start: ? — End: 2018-10-31

## 2018-10-31 MED ORDER — HYDROXYZINE HCL 25 MG PO TABS
25.00 | ORAL_TABLET | ORAL | Status: DC
Start: ? — End: 2018-10-31

## 2018-10-31 MED ORDER — GENERIC EXTERNAL MEDICATION
50.00 | Status: DC
Start: 2018-10-31 — End: 2018-10-31

## 2018-10-31 MED ORDER — AMOXICILLIN-POT CLAVULANATE 875-125 MG PO TABS
875.00 | ORAL_TABLET | ORAL | Status: DC
Start: 2018-10-31 — End: 2018-10-31

## 2018-10-31 MED ORDER — GENERIC EXTERNAL MEDICATION
40.00 | Status: DC
Start: 2018-11-01 — End: 2018-10-31

## 2018-10-31 MED ORDER — BACITRACIN ZINC 500 UNIT/GM EX OINT
TOPICAL_OINTMENT | CUTANEOUS | Status: DC
Start: 2018-10-31 — End: 2018-10-31

## 2018-10-31 MED ORDER — ROSUVASTATIN CALCIUM 20 MG PO TABS
20.00 | ORAL_TABLET | ORAL | Status: DC
Start: 2018-10-31 — End: 2018-10-31

## 2018-10-31 MED ORDER — ONDANSETRON HCL 4 MG/2ML IJ SOLN
4.00 | INTRAMUSCULAR | Status: DC
Start: ? — End: 2018-10-31

## 2018-10-31 MED ORDER — ENOXAPARIN SODIUM 40 MG/0.4ML ~~LOC~~ SOLN
40.00 | SUBCUTANEOUS | Status: DC
Start: 2018-11-01 — End: 2018-10-31

## 2018-10-31 MED ORDER — ACETAMINOPHEN 325 MG PO TABS
325.00 | ORAL_TABLET | ORAL | Status: DC
Start: 2018-10-31 — End: 2018-10-31

## 2018-11-06 ENCOUNTER — Other Ambulatory Visit: Payer: Self-pay | Admitting: Family Medicine

## 2018-11-06 ENCOUNTER — Telehealth: Payer: Self-pay | Admitting: *Deleted

## 2018-11-06 DIAGNOSIS — I10 Essential (primary) hypertension: Secondary | ICD-10-CM

## 2018-11-06 NOTE — Telephone Encounter (Signed)
Oncology Nurse Navigator Documentation  Spoke with pt's wife to check on his well-being s/p his 3/16 surgery with Dr. Nicolette Bang, [facility].  She reported he experienced a minor complication which delayed his DC until 3/26 but doing fairly well now.  Still has nasogastric tube but slowly transitioning to oral intake for nutrition (ate scrambled eggs w/ cheese yesterday) and meds (trying with applesauce, pudding).  She recapped yesterday's follow-up appt with Dr. Ortencia Kick, indicated he is going to call them to discuss surgical path results when available.  I provided an overview of plan should he need adjuvant RT, timeframe of implementation.  She voiced understanding. I encouraged her to call me with questions/concerns.  She agreed.  [name], RN, BSN Head & Neck Oncology Nurse [place] at [place] [phone]

## 2018-11-07 ENCOUNTER — Telehealth: Payer: Self-pay | Admitting: *Deleted

## 2018-11-07 NOTE — Telephone Encounter (Signed)
Oncology Nurse Navigator Documentation  Spoke with Mr. Buda, informed him of 4/20 7:30 NE, 8:00 Re-consult with Dr. Isidore Moos, 9:00 CT SIM.  Explained he will receive call from [place] to schedule appt preceding week ffor fitting of scatter guards and fluoride trays.  He voiced understanding, stated he will have his wife call me to verify.  ,[name], RN, BSN Head & Neck Oncology Nurse [place] at [place] [phone]

## 2018-11-13 ENCOUNTER — Other Ambulatory Visit: Payer: Self-pay

## 2018-11-13 ENCOUNTER — Other Ambulatory Visit: Payer: Self-pay | Admitting: Radiation Oncology

## 2018-11-13 ENCOUNTER — Ambulatory Visit (HOSPITAL_COMMUNITY): Payer: Medicaid - Dental | Admitting: Dentistry

## 2018-11-13 ENCOUNTER — Encounter (HOSPITAL_COMMUNITY): Payer: Self-pay | Admitting: Dentistry

## 2018-11-13 VITALS — BP 119/88 | HR 73 | Temp 98.3°F

## 2018-11-13 DIAGNOSIS — K08199 Complete loss of teeth due to other specified cause, unspecified class: Secondary | ICD-10-CM

## 2018-11-13 DIAGNOSIS — Z463 Encounter for fitting and adjustment of dental prosthetic device: Secondary | ICD-10-CM

## 2018-11-13 DIAGNOSIS — Z01818 Encounter for other preprocedural examination: Secondary | ICD-10-CM

## 2018-11-13 DIAGNOSIS — C099 Malignant neoplasm of tonsil, unspecified: Secondary | ICD-10-CM

## 2018-11-13 NOTE — Patient Instructions (Addendum)
Plan: 1. Brush teeth after meals and at bedtime. 2. Use fluoride and fluoride trays at bedtime as instructed. 3. Patient to call for periodic oral examination in approximately 2-3  weeks as needed during radiation therapy, otherwise patient to be seen approximately one month after radiation therapy has been completed. 4. Patient to call if questions or problems arise before then.  Lenn Cal, DDS    FLUORIDE TRAYS PATIENT INSTRUCTIONS    Obtain Prevident 5000 prescription from the pharmacy.  Don't be surprised if it needs to be ordered.  Be sure to let the pharmacy know when you are close to needing a new refill for them to have it ready for you without interruption of Fluoride use.  The best time to use your Fluoride is before bedtime.  You must brush your teeth very well and floss before using the Fluoride in order to get the best use out of the Fluoride treatments.  Place Fluoride gel in the tray and spread gel around in the tray with your finger or cotton tip applicator.  Place the tray on your lower teeth and your upper teeth.  Make sure the trays are seated all the way.  Remember, they only fit one way on your teeth.  Insert for 5 full minutes.  At the end of the 5 minutes, take the trays out.  SPIT OUT excess.   Do NOT rinse your mouth!  Do NOT eat or drink after treatments for at least 30 minutes.  This is why the best time for your treatments is before bedtime.  Clean the inside of your Fluoride trays using COLD WATER and a toothbrush.  In order to keep your Trays from discoloring and free from odors, soak them overnight in denture cleaners such as Efferdent.  Do not use bleach or non denture products.  Store the trays in a safe dry place AWAY from any heat until your next treatment.  If anything happens to your Fluoride trays, or they don't fit as well after any dental work, please let us know as soon as possible.    TRISMUS  Trismus is a condition  where the jaw does not allow the mouth to open as wide as it usually does.  This can happen almost suddenly, or in other cases the process is so slow, it is hard to notice it-until it is too far along.  When the jaw joints and/or muscles have been exposed to radiation treatments, the onset of Trismus is very slow.  This is because the muscles are losing their stretching ability over a long period of time, as long as 2 YEARS after the end of radiation.  It is therefore important to exercise these muscles and joints.  TRISMUS EXERCISES   Stack of tongue depressors measuring the same or a little less than the last documented MIO (Maximum Interincisal Opening).  Secure them with a rubber band on both ends.  Place the stack in the patient's mouth, supporting the other end.  Allow 30 seconds for muscle stretching.  Rest for a few seconds.  Repeat 3-5 times  For all radiation patients, this exercise is recommended in the mornings and evenings unless otherwise instructed.  The exercise should be done for a period of 2 YEARS after the end of radiation.  MIO should be checked routinely on recall dental visits by the general dentist or the hospital dentist.  The patient is advised to report any changes, soreness, or difficulties encountered when doing the exercises.

## 2018-11-13 NOTE — Progress Notes (Signed)
11/13/2018  Patient Name:   [name] Date of Birth:   [birth year] Medical Record Number: [id number]  BP 119/88 (BP Location: Right Arm)   Pulse 73   Temp 98.3 F (36.8 C)   Eben Burow now presents for insertion of upper and lower fluoride trays and scatter protection devices. Patient had tooth #'s 2 and 31 extracted by Dr. Desmond Dike on 10/21/2018 at the time of the TORS procedure.  SUBJECTIVE: Patient denies having any problems associated with that dental extraction sites.  OBJECTIVE: There is no sign of infection, heme, or ooze. Extraction sites are healing in well.  PROCEDURE: Appliances were tried in and adjusted as needed. [place] Trismus device was fabricated at 34 mm using 18 sticks. Postop instructions were provided and a written and verbal format concerning the use and care of appliances. All questions were answered.  Plan: 1. Brush teeth after meals and at bedtime. 2. Use fluoride and fluoride trays at bedtime as instructed. 3. Patient to call for periodic oral examination in approximately 2-3  weeks as needed during radiation therapy, otherwise patient to be seen approximately one month after radiation therapy has been completed. 4. Patient to call if questions or problems arise before then.  Lenn Cal, DDS

## 2018-11-15 ENCOUNTER — Telehealth: Payer: Self-pay | Admitting: *Deleted

## 2018-11-15 ENCOUNTER — Other Ambulatory Visit: Payer: Self-pay | Admitting: Radiation Oncology

## 2018-11-15 DIAGNOSIS — C01 Malignant neoplasm of base of tongue: Secondary | ICD-10-CM

## 2018-11-15 NOTE — Telephone Encounter (Signed)
Oncology Nurse Navigator Documentation  Spoke with pt wife in follow-up to her 54 e-mail re current visitor policy and to coordinate appt with Dr. Maylon Peppers.  Re visitor policy, she expressed frustration she was unable to accompany her husband to Wed morning Dental Medicine appt.  Stated that though she understands need for COVID-19 precautions, she feels her husband's Alzheimers warrants  an exception to the guideline.  I explained no exceptions are being granted at this time but assured her is being accompanied by staff throughout his visits/appts.  I suggested she call her husband during appts with physicians so she can be part of the conversation.  She acknowledged this as a viable option.  I informed her Dr. Maylon Peppers wished to have appt with Mr. Ayala next Wed to discuss bx results and role of chemotherpy as part of his tmt plan.  She opted f0r 11:30 appt.  [name], RN, BSN Head & Neck Oncology Nurse [place] at [place] [phone]

## 2018-11-19 ENCOUNTER — Ambulatory Visit (INDEPENDENT_AMBULATORY_CARE_PROVIDER_SITE_OTHER): Payer: Medicare Other | Admitting: Psychiatry

## 2018-11-19 ENCOUNTER — Other Ambulatory Visit: Payer: Self-pay

## 2018-11-19 DIAGNOSIS — F320 Major depressive disorder, single episode, mild: Secondary | ICD-10-CM | POA: Diagnosis not present

## 2018-11-19 MED ORDER — VORTIOXETINE HBR 20 MG PO TABS: 20.0000 mg | ORAL_TABLET | Freq: Every day | ORAL | 1 refills | Status: DC

## 2018-11-19 MED ORDER — MIRTAZAPINE 30 MG PO TBDP: 30.0000 mg | ORAL_TABLET | Freq: Every day | ORAL | 5 refills | Status: DC

## 2018-11-19 NOTE — Progress Notes (Signed)
[place] OFFICE PROGRESS NOTE  Patient Care Team: Denita Lung, MD as PCP - General (Family Medicine) Francina Ames, MD as Referring Physician (Otolaryngology) Eppie Gibson, MD as Attending Physician (Radiation Oncology) Leota Sauers, RN as Oncology Nurse Navigator Lenn Cal, DDS as Consulting Physician (Dentistry)  HEME/ONC OVERVIEW: 1. 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, p16+ -Late 07/2018: presented to PCP for R neck fullness, possibly viral URI; CT neck showed a R tonsillar mass with an enlarged R Level II LN measuring up to 3.4cm -08/2018: CT chest showed a tiny LUL nodule; DL showed a 3cm fungating mass in the base of the tongue, path showing squamous papilloma with severe squamous dysplasia; US-guided FNA of the R cervical LN showed SCCa, p16-  -09/2018: PET showed the R tonsil lesion (SUV 13.4) and a single R Level II LN ~1.2cm (SUV 4.9); no contralateral LN involvement or metastatic disease  -11/2018: TORS at [facility]; path showed R BOT SCCa, 2.4cm, p16+, margins free of invasive carcinoma but positive for high-grade dysplasia in lateral and distal margins, 3/42 LN's positive with focal ECE (0.64mm); pT2,pN1  2. 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, p16+ -I reviewed the pathology results in detail with the patient, as well as the NCCN guideline; patient's spouse joined the conversation via telephone, as the patient has at least mildly to moderately advanced dementia -In light of the extracapsular extension, the standard-of-care approach is adjuvant chemoradiation -I discussed some of the key studies regarding the benefit of chemotherapy  (concurrent with radiation) in the adjuvant setting, as well to some of the potential side effects of chemotherapy -In the patient's case, the extracapsular extension was very minimal (measuring 0.6 mm), raising the question regarding the benefit of adjuvant chemotherapy  -Furthermore, there are retrospective data, mostly involving unplanned subgroup analyses, suggesting that toxicity of chemotherapy and its impact on quality of life for patients with p16+ H&N cancer are major concerns, as these patients tend to have generally more favorable prognosis than the p16- ones. Given the patient's dementia, the impact on the quality of life may be especially pronounced. -Finally, in light of the ongoing COVID-19 outbreak, the risks of infection and potential serious complications are very high for immunocompromised patients -Taking into account the small margin of benefit from chemotherapy in light of minimal ECE, the patient's dementia, and the risks of infection and potential complications in the setting of ongoing COVID-19 outbreaks for immunocompromised patients, I expressed my concern to the patient (and his spouse on the phone) that the risks of chemotherapy would likely outweigh benefits -After lengthy discussions, the patient (and his wife) expressed understanding and elected to forego adjuvant chemotherapy -He will continue follow-up with Dr. Isidore Moos of radiation oncology for discussions of adjuvant radiation   Silent aspiration -I reviewed recent swallow study from [facility], which showed aspiration; ENT/radiation oncology have recommended feeding tube placement -Patient signed release of information today in clinic, and the H&N navigator will coordinate with [facility] for the scheduling of the feeding tube placement   No orders of the defined types were placed in this encounter.  All questions were answered. The patient knows to call the clinic with  any problems, questions or concerns. No  barriers to learning was detected.  A total of more than 40 minutes were spent face-to-face with the patient during this encounter and over half of that time was spent on counseling and coordination of care as outlined above.   No return to the oncology clinic is needed. Patient will continue follow-up with radiation oncology and ENT.   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  Patient's wife during the visit via telephone call.  Patient reports that since her surgery, he has been very fatigued and worn down.  They recently moved to a new house, and he is able to help with some chores around the house, but has not been able to form strenuous activities.,  Dementia standpoint, the patient's wife reports that his memory has slightly declined since recent surgery, including difficulty remembering where things are.  He is able to eat and drink without significant clinical symptoms of aspiration, except occasional coughing after eating.  He denies any other complaint today.  SUMMARY OF ONCOLOGIC HISTORY:   Squamous cell carcinoma of right tonsil ([place])   08/06/2018 Imaging    CT neck w/ contrast: IMPRESSION: 1. 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 2. Enlarged necrotic lymph nodes at the right level 2 station measuring up to 3.4 cm, consistent with metastatic disease. 3. Pleural based nodule in the anterior left upper lobe likely represents scarring in the setting of centrilobular emphysema. Given mucosal malignancy, CT of the chest with contrast is recommended for further evaluation.    08/09/2018 Imaging    CT chest w/ contrast: IMPRESSION: 1. No definite findings to suggest metastatic disease in the thorax. 2. 4 mm peripheral nodular density  in the left lung is probably scar but attention on follow-up recommended. 3. 11 mm low-density lesion posterior right liver, likely benign and probably a cyst. This could also be reassessed at the time of follow-up imaging. 4. Cholelithiasis. 5.  Aortic Atherosclerois (ICD10-170.0)    09/04/2018 Procedure    US-guided FNA of R cervical LN     09/04/2018 Pathology Results    Accession: [id number]  NECK, FINE NEEDLE ASPIRATION, RIGHT CERVICAL NECROTIC ADENOPATHY (SPECIMEN 1 OF 1 COLLECTED 09/04/2018) - MALIGNANT CELLS CONSISTENT WITH METASTATIC SQUAMOUS CELL CARCINOMA - SEE COMMENT  p16 immunohistochemistry was attempted on the cell block material. There are scattered clusters of squamous cell carcinoma that are NEGATIVE for p16.    09/16/2018 Initial Diagnosis    Squamous cell carcinoma of right tonsil ([place])    09/16/2018 Imaging    PET: IMPRESSION: 1. Focal activity in the RIGHT lingual tonsil consists with primary head and neck carcinoma. 2. Ipsilateral nodal metastasis to a  RIGHT level II lymph node. 3. No evidence of contralateral nodal metastasis. 4. No evidence distant metastatic disease. 5. LEFT upper lobe pulmonary nodule without metabolic activity. Recommend attention on follow-up. 6. Centrilobular emphysema the upper lobes.    10/21/2018 Pathology Results    Procedure: Partial glossectomy. Tumor Site: Oropharynx at base of tongue. Tumor Laterality: Right. Tumor Focality: Unifocal. Tumor Size: Greatest dimension 2.4 cm. Histologic Type: Squamous cell carcinoma. Histologic Grade: P16 positive in primary tumor and metastatic lymph node. Margins: Free of invasive carcinoma. High grade squamous dysplasia involves lateral and distal  margins. Tumor Bed: N/A. Tumor Bed margins: N/A. Lymphovascular Invasion: No. Perineural Invasion: No. Regional Lymph Nodes: Number of Lymph Nodes Involved: 3. Number of Lymph Nodes Examined: 42. Laterality of Lymph Nodes Involved:  Right. Size of Largest Metastatic Deposit (centimeters): 2.5 cm Extranodal Extension (ENE): Present. Distance from lymph node capsule (millimeters): 0.6 mm. Pathologic Stage Classification (pTNM, AJCC 8th Edition): pT2, pN1. Ancillary Studies: P16 positive in primary tumor and metastatic lymph node.     Malignant neoplasm of base of tongue ([place])   09/27/2018 Initial Diagnosis    Malignant neoplasm of base of tongue ([place])    09/27/2018 Cancer Staging    Staging form: Pharynx - P16 Negative Oropharynx, AJCC 8th Edition - Clinical stage from 09/27/2018: Stage IVA (cT2, cN2b, cM0, p16-) - Signed by Eppie Gibson, MD on 09/27/2018     REVIEW OF SYSTEMS:   Constitutional: ( - ) fevers, ( - )  chills , ( - ) night sweats Eyes: ( - ) blurriness of vision, ( - ) double vision, ( - ) watery eyes Ears, nose, mouth, throat, and face: ( - ) mucositis, ( - ) sore throat Respiratory: ( + ) cough, ( - ) dyspnea, ( - ) wheezes Cardiovascular: ( - ) palpitation, ( - ) chest discomfort, ( - ) lower extremity swelling Gastrointestinal:  ( - ) nausea, ( - ) heartburn, ( - ) change in bowel habits Skin: ( - ) abnormal skin rashes Lymphatics: ( - ) new lymphadenopathy, ( - ) easy bruising Neurological: ( - ) numbness, ( - ) tingling, ( - ) new weaknesses Behavioral/Psych: ( - ) mood change, ( - ) new changes  All other systems were reviewed with the patient and are negative.  I have reviewed the past medical history, past surgical history, social history and family history with the patient and they are unchanged from previous note.  ALLERGIES:  has No Known Allergies.  MEDICATIONS:  Current Outpatient Medications  Medication Sig Dispense Refill  . acetaminophen (TYLENOL) 500 MG tablet Take 500 mg by mouth every 6 (six) hours as needed for moderate pain or headache.    Marland Kitchen aspirin EC 81 MG tablet Take 81 mg by mouth daily.    Marland Kitchen buPROPion (WELLBUTRIN XL) 150 MG 24 hr tablet 3   qam 90 tablet 3  .  cholecalciferol (VITAMIN D) 1000 UNITS tablet Take 2,000 Units by mouth daily.     Marland Kitchen donepezil (ARICEPT) 10 MG tablet Take 1 tablet (10 mg total) by mouth at bedtime. 30 tablet 5  . dutasteride (AVODART) 0.5 MG capsule TAKE 1 CAPSULE(0.5 MG) BY MOUTH DAILY (Patient taking differently: Take 0.5 mg by mouth daily. ) 90 capsule 3  . mirtazapine (REMERON SOLTAB) 30 MG disintegrating tablet Take 1 tablet (30 mg total) by mouth at bedtime. 30 tablet 5  . Multiple Vitamins-Minerals (MULTIVITAMIN WITH MINERALS) tablet Take 1 tablet by mouth daily.      . Omega-3 Fatty Acids (FISH OIL BURP-LESS) 1200 MG CAPS Take 1,200 mg by mouth.    Marland Kitchen omeprazole (PRILOSEC) 40 MG capsule TAKE 1 CAPSULE(40 MG) BY MOUTH DAILY 30 capsule 0  . polyethylene glycol powder (GLYCOLAX/MIRALAX) powder Take 17 g by mouth daily. 3350 g 11  . primidone (MYSOLINE) 50 MG tablet Take 1 tablet (50 mg total) by mouth 2 (two) times daily. 180 tablet 1  . rosuvastatin (CRESTOR) 20 MG tablet TAKE 1 TABLET(20 MG) BY MOUTH DAILY 90 tablet 0  . sodium fluoride (PREVIDENT 5000  PLUS) 1.1 % CREA dental cream Apply cream to tooth brush. Brush teeth for 2 minutes. Spit out excess. DO NOT rinse afterwards. Repeat nightly. 1 Tube prn  . terazosin (HYTRIN) 2 MG capsule TAKE 1 CAPSULE(2 MG) BY MOUTH DAILY 90 capsule 1  . triamcinolone (NASACORT ALLERGY 24HR) 55 MCG/ACT AERO nasal inhaler Place 1 spray into the nose daily.    Marland Kitchen vortioxetine HBr (TRINTELLIX) 20 MG TABS tablet Take 1 tablet (20 mg total) by mouth daily. 90 tablet 1   No current facility-administered medications for this visit.     PHYSICAL EXAMINATION: ECOG PERFORMANCE STATUS: 2 - Symptomatic, <50% confined to bed  Today's Vitals   11/20/18 1145 11/20/18 1146  BP: 120/83   Pulse: 77   Resp: 18   Temp: 98.2 F (36.8 C)   TempSrc: Oral   SpO2: 100%   Weight: 128 lb 12.8 oz (58.4 kg)   Height: 5\' 7"  (1.702 m)   PainSc:  0-No pain   Body mass index is 20.17 kg/m.  Filed Weights    11/20/18 1145  Weight: 128 lb 12.8 oz (58.4 kg)    GENERAL: alert, no distress and comfortable, mildly frail appearing  SKIN: skin color, texture, turgor are normal, no rashes or significant lesions EYES: conjunctiva are pink and non-injected, sclera clear OROPHARYNX: no exudate, no erythema; lips, buccal mucosa, and tongue normal  NECK: neck dissection scar healing well  LYMPH:  no palpable lymphadenopathy in the cervical LUNGS: clear to auscultation with normal breathing effort HEART: regular rate & rhythm and no murmurs and no lower extremity edema ABDOMEN: soft, non-tender, non-distended, normal bowel sounds Musculoskeletal: no cyanosis of digits and no clubbing  PSYCH: alert, fluent speech NEURO: no focal motor/sensory deficits  LABORATORY DATA:  I have reviewed the data as listed    Component Value Date/Time   NA 141 09/19/2018 0757   NA 140 07/26/2018 1435   K 4.2 09/19/2018 0757   CL 104 09/19/2018 0757   CO2 30 09/19/2018 0757   GLUCOSE 82 09/19/2018 0757   BUN 18 09/19/2018 0757   BUN 15 07/26/2018 1435   CREATININE 0.99 09/19/2018 0757   CREATININE 0.84 06/08/2016 1425   CALCIUM 9.7 09/19/2018 0757   PROT 6.0 (L) 09/19/2018 0757   PROT 6.6 07/26/2018 1435   ALBUMIN 4.4 09/19/2018 0757   ALBUMIN 4.7 07/26/2018 1435   AST 19 09/19/2018 0757   ALT 18 09/19/2018 0757   ALKPHOS 52 09/19/2018 0757   BILITOT 0.5 09/19/2018 0757   GFRNONAA >60 09/19/2018 0757   GFRAA >60 09/19/2018 0757    No results found for: SPEP, UPEP  Lab Results  Component Value Date   WBC 3.5 (L) 09/19/2018   NEUTROABS 2.0 09/19/2018   HGB 13.0 09/19/2018   HCT 38.8 (L) 09/19/2018   MCV 93.5 09/19/2018   PLT 199 09/19/2018      Chemistry      Component Value Date/Time   NA 141 09/19/2018 0757   NA 140 07/26/2018 1435   K 4.2 09/19/2018 0757   CL 104 09/19/2018 0757   CO2 30 09/19/2018 0757   BUN 18 09/19/2018 0757   BUN 15 07/26/2018 1435   CREATININE 0.99 09/19/2018 0757    CREATININE 0.84 06/08/2016 1425      Component Value Date/Time   CALCIUM 9.7 09/19/2018 0757   ALKPHOS 52 09/19/2018 0757   AST 19 09/19/2018 0757   ALT 18 09/19/2018 0757   BILITOT 0.5 09/19/2018 0757

## 2018-11-19 NOTE — Progress Notes (Signed)
.  Psychiatric Initial Adult Assessment   Patient Identification: SOPHIA CUBERO MRN:  [id number] Date of Evaluation:  11/19/2018 Referral Source: Dr. Wells Guiles Tat Chief Complaint:   Visit Diagnosis: Major depression mild  History of Present Illness:   Today the patient is interviewed with his wife [name].  The patient has had significant surgery to his neck and his mouth.  He still having a lot of problems swallowing.  He does not seem to be in much pain but is very comfortable.  His upcoming appointments in regards to his surgery in the next week.  Our parameters to look for depression included assess of irritability, early morning awakening and a reduction in his appetite.  His weight loss could be caused by many different things including his oral cancer.  Patient claims he is concentrating fairly well.  Is never been having evidence of psychosis or alcohol abuse.  Patient is under stress as he recently moved from one home to another.  His wife is very supportive.  The patient does not seem to be bothered much by the viral pandemic.  Unfortunately some difficulties with his Wellbutrin.  The hospital apparently changed to 100 mg 3 times daily.  He and his wife noted no difference.  Patient is not suicidal.  He is pleasant, and articulate.  His mood seems to be stable. Associated Signs/Symptoms: Depression Symptoms:  fatigue, (Hypo) Manic Symptoms:   Anxiety Symptoms:   Psychotic Symptoms:   PTSD Symptoms:   Past Psychiatric History: Trintellix, Wellbutrin  Previous Psychotropic Medications:   Substance Abuse History in the last 12 months:    Consequences of Substance Abuse:   Past Medical History:  Past Medical History:  Diagnosis Date  . Allergy   . Alzheimer disease ([place]) 06/2018   diagnosed with early stage alzheimers  . Arthritis    osteoarthritis  . Cancer [facility])    testicular, age 54, orchiectomy 80  . Cataract    B/L CATARACTS NO SURGERY  . Dementia ([place])   .  Depression   . Dyslipidemia   . Heartburn    TAKES ZANTAC  . Hypertension   . Hypogonadism male   . Prostate cancer ([place]) 06/07/11   gleason 3+3=6, vol 59.5 cc  . Sleep apnea   . Status post chemotherapy    testicular cancer 1992, Dr Beryle Beams    Past Surgical History:  Procedure Laterality Date  . APPENDECTOMY    . COLONOSCOPY  2007   gessner  . DIRECT LARYNGOSCOPY N/A 08/19/2018   Procedure: DIRECT LARYNGOSCOPY;  Surgeon: Leta Baptist, MD;  Location: [place];  Service: ENT;  Laterality: N/A;  . INGUINAL HERNIA REPAIR     w/orchiectomy 1992, retroperitoneal lymph node dissection  . MENISECTOMY     R&L knee surgeries  . PROSTATE SURGERY     biopsy x 2  . TONGUE BIOPSY N/A 08/19/2018   Procedure: BIOPSY OF TONGUE BASE MASS;  Surgeon: Leta Baptist, MD;  Location: [place];  Service: ENT;  Laterality: N/A;    Family Psychiatric History:   Family History:  Family History  Problem Relation Age of Onset  . Lung cancer Mother   . Hypertension Mother   . Lung cancer Father   . Heart failure Father   . Hypertension Father     Social History:   Social History   Socioeconomic History  . Marital status: Married    Spouse name: Not on file  . Number of children: 0  .  Years of education: Not on file  . Highest education level: Not on file  Occupational History  . Occupation: RETIRED    Employer: [employer]  Social Needs  . Financial resource strain: Not on file  . Food insecurity:    Worry: Not on file    Inability: Not on file  . Transportation needs:    Medical: No    Non-medical: No  Tobacco Use  . Smoking status: Former Smoker    Packs/day: 1.00    Years: 20.00    Pack years: 20.00    Types: Cigarettes    Last attempt to quit: 08/03/1999    Years since quitting: 19.3  . Smokeless tobacco: Never Used  Substance and Sexual Activity  . Alcohol use: Not Currently    Alcohol/week: 6.0 standard drinks    Types: 6 Cans of  beer per week  . Drug use: No  . Sexual activity: Yes  Lifestyle  . Physical activity:    Days per week: Not on file    Minutes per session: Not on file  . Stress: Not on file  Relationships  . Social connections:    Talks on phone: Not on file    Gets together: Not on file    Attends religious service: Not on file    Active member of club or organization: Not on file    Attends meetings of clubs or organizations: Not on file    Relationship status: Not on file  Other Topics Concern  . Not on file  Social History Narrative  . Not on file    Additional Social History:   Allergies:  No Known Allergies  Metabolic Disorder Labs: No results found for: HGBA1C, MPG No results found for: PROLACTIN Lab Results  Component Value Date   CHOL 146 09/17/2017   TRIG 63 09/17/2017   HDL 63 09/17/2017   CHOLHDL 2.3 09/17/2017   VLDL 18 09/11/2016   LDLCALC 70 09/17/2017   LDLCALC 67 09/11/2016   Lab Results  Component Value Date   TSH 2.057 09/19/2018    Therapeutic Level Labs: No results found for: LITHIUM No results found for: CBMZ No results found for: VALPROATE  Current Medications: Current Outpatient Medications  Medication Sig Dispense Refill  . acetaminophen (TYLENOL) 500 MG tablet Take 500 mg by mouth every 6 (six) hours as needed for moderate pain or headache.    Marland Kitchen aspirin EC 81 MG tablet Take 81 mg by mouth daily.    Marland Kitchen buPROPion (WELLBUTRIN XL) 150 MG 24 hr tablet 3   qam 90 tablet 3  . cholecalciferol (VITAMIN D) 1000 UNITS tablet Take 2,000 Units by mouth daily.     Marland Kitchen donepezil (ARICEPT) 10 MG tablet Take 1 tablet (10 mg total) by mouth at bedtime. 30 tablet 5  . dutasteride (AVODART) 0.5 MG capsule TAKE 1 CAPSULE(0.5 MG) BY MOUTH DAILY (Patient taking differently: Take 0.5 mg by mouth daily. ) 90 capsule 3  . Multiple Vitamins-Minerals (MULTIVITAMIN WITH MINERALS) tablet Take 1 tablet by mouth daily.      . Omega-3 Fatty Acids (FISH OIL BURP-LESS) 1200 MG CAPS  Take 1,200 mg by mouth.    Marland Kitchen omeprazole (PRILOSEC) 40 MG capsule TAKE 1 CAPSULE(40 MG) BY MOUTH DAILY 30 capsule 0  . polyethylene glycol powder (GLYCOLAX/MIRALAX) powder Take 17 g by mouth daily. 3350 g 11  . primidone (MYSOLINE) 50 MG tablet Take 1 tablet (50 mg total) by mouth 2 (two) times daily. 180 tablet 1  .  rosuvastatin (CRESTOR) 20 MG tablet TAKE 1 TABLET(20 MG) BY MOUTH DAILY 90 tablet 0  . sodium fluoride (PREVIDENT 5000 PLUS) 1.1 % CREA dental cream Apply cream to tooth brush. Brush teeth for 2 minutes. Spit out excess. DO NOT rinse afterwards. Repeat nightly. 1 Tube prn  . terazosin (HYTRIN) 2 MG capsule TAKE 1 CAPSULE(2 MG) BY MOUTH DAILY 90 capsule 1  . triamcinolone (NASACORT ALLERGY 24HR) 55 MCG/ACT AERO nasal inhaler Place 1 spray into the nose daily.    . TRINTELLIX 20 MG TABS tablet TAKE 1 TABLET BY MOUTH DAILY 90 tablet 1   No current facility-administered medications for this visit.     Musculoskeletal: Strength & Muscle Tone: within normal limits Gait & Station: normal Patient leans: N/A  Psychiatric Specialty Exam: ROS  There were no vitals taken for this visit.There is no height or weight on file to calculate BMI.  General Appearance: Casual  Eye Contact:  Good  Speech:  Normal Rate  Volume:  Normal  Mood:  Negative  Affect:  Congruent  Thought Process:  Goal Directed  Orientation:  Full (Time, Place, and Person)  Thought Content:  Logical  Suicidal Thoughts:  No  Homicidal Thoughts:  No  Memory:  NA  Judgement:  Good  Insight:  Fair  Psychomotor Activity:  Normal  Concentration:    Recall:  Hood of Knowledge:Good  Language: Good  Akathisia:  No  Handed:  Right  AIMS (if indicated):  not done  Assets:  Desire for Improvement Financial Resources/Insurance  ADL's:  Intact  Cognition: Impaired,  Moderate  Sleep:     Screenings: Mini-Mental     Office Visit from 06/08/2016 in [place]  Total Score (max 30 points )  28     PHQ2-9     Office Visit from 09/18/2018 in [place] Visit from 09/17/2017 in [place] Visit from 09/11/2016 in [place] Visit from 08/31/2015 in [place] Visit from 08/24/2014 in [place]  PHQ-2 Total Score  6  4  4  6  6   PHQ-9 Total Score  19  10  -  16  -      Assessment and Plan:   At this time the patient is #1 problem seems to be major clinical depression.  He is been taking high-dose Trintellix constantly.  I do not think the Wellbutrin is been much of a difference and I am not inspired to give him more of it.  Today we will discontinue it and begin him on Remeron 30 mg pill pack.  Patient return to see me in 7 weeks.  He only has 1 problem that of major depression.  He is not suicidal and he seems to be functioning fairly well.  Note is also diagnosed with early Alzheimer's dementia.  Jerral Ralph, MD 4/14/20203:29 PM

## 2018-11-20 ENCOUNTER — Encounter: Payer: Self-pay | Admitting: *Deleted

## 2018-11-20 ENCOUNTER — Inpatient Hospital Stay: Payer: Medicare Other | Attending: Hematology | Admitting: Hematology

## 2018-11-20 ENCOUNTER — Encounter: Payer: Self-pay | Admitting: Hematology

## 2018-11-20 ENCOUNTER — Other Ambulatory Visit: Payer: Self-pay

## 2018-11-20 ENCOUNTER — Telehealth: Payer: Self-pay | Admitting: *Deleted

## 2018-11-20 ENCOUNTER — Telehealth: Payer: Self-pay | Admitting: Hematology

## 2018-11-20 VITALS — BP 120/83 | HR 77 | Temp 98.2°F | Resp 18 | Ht 67.0 in | Wt 128.8 lb

## 2018-11-20 DIAGNOSIS — C099 Malignant neoplasm of tonsil, unspecified: Secondary | ICD-10-CM

## 2018-11-20 DIAGNOSIS — R911 Solitary pulmonary nodule: Secondary | ICD-10-CM | POA: Diagnosis not present

## 2018-11-20 DIAGNOSIS — Z7982 Long term (current) use of aspirin: Secondary | ICD-10-CM | POA: Diagnosis not present

## 2018-11-20 DIAGNOSIS — F0390 Unspecified dementia without behavioral disturbance: Secondary | ICD-10-CM | POA: Diagnosis not present

## 2018-11-20 DIAGNOSIS — J432 Centrilobular emphysema: Secondary | ICD-10-CM | POA: Diagnosis not present

## 2018-11-20 DIAGNOSIS — Z79899 Other long term (current) drug therapy: Secondary | ICD-10-CM | POA: Diagnosis not present

## 2018-11-20 DIAGNOSIS — Z8547 Personal history of malignant neoplasm of testis: Secondary | ICD-10-CM | POA: Insufficient documentation

## 2018-11-20 DIAGNOSIS — Z8546 Personal history of malignant neoplasm of prostate: Secondary | ICD-10-CM

## 2018-11-20 DIAGNOSIS — K8020 Calculus of gallbladder without cholecystitis without obstruction: Secondary | ICD-10-CM | POA: Diagnosis not present

## 2018-11-20 DIAGNOSIS — T17908S Unspecified foreign body in respiratory tract, part unspecified causing other injury, sequela: Secondary | ICD-10-CM

## 2018-11-20 NOTE — Telephone Encounter (Signed)
Oncology Nurse Navigator Documentation  In follow-up to confirmation from H&N Oncology Nurse Navigator [facility] is presently placing PEGs, faxed per Surgical Scheduling's guidance, Drs. Squire's and Zhao's recent Progress Notes and PEG order.   Notification of successful fax transmission received.  [name] confirmed earlier today [name] has 4/21 2:00 surgical consultation appt.  [name], RN, BSN Head & Neck Oncology Nurse [place] at [place] [phone]   .

## 2018-11-20 NOTE — Progress Notes (Signed)
Oncology Nurse Navigator Documentation  Met with [name] during Est Pt appt with Dr. Maylon Peppers.  His wife joined [place] by phone.  They voiced understanding of chemotherapy risks/benefits in context of his surgical pathology and underlying health issues, concurred with Dr. Lorette Ang recommendation to proceed with adjuvant RT only.  They indicated this was Dr. Servando Salina recommendation also.  I informed them of forwarded e-mail received from [facility] SLP [place] recommending PEG while receiving adjuvant RT.  They voiced understanding and agreement.  I explained I will coordinate.    They confirmed understanding of 4/20 9:00 CT SIM. I encouraged them to call me with questions/concerns.  [name], RN, BSN Head & Neck Oncology Nurse [place] at [place] [phone]

## 2018-11-20 NOTE — Telephone Encounter (Signed)
Per 4/15 los No return to oncology needed - Continue follow-up with radiation oncology

## 2018-11-21 ENCOUNTER — Ambulatory Visit (HOSPITAL_COMMUNITY): Payer: Self-pay | Admitting: Psychiatry

## 2018-11-25 ENCOUNTER — Other Ambulatory Visit: Payer: Self-pay

## 2018-11-25 ENCOUNTER — Ambulatory Visit
Admission: RE | Admit: 2018-11-25 | Discharge: 2018-11-25 | Disposition: A | Discharge status: Home / Self Care | Payer: Medicare Other | Source: Ambulatory Visit | Attending: Radiation Oncology | Admitting: Radiation Oncology

## 2018-11-25 DIAGNOSIS — Z510 Encounter for antineoplastic radiation therapy: Secondary | ICD-10-CM | POA: Insufficient documentation

## 2018-11-25 DIAGNOSIS — C01 Malignant neoplasm of base of tongue: Secondary | ICD-10-CM | POA: Diagnosis not present

## 2018-11-26 NOTE — Progress Notes (Signed)
Head and Neck Cancer Simulation, IMRT treatment planning note   Outpatient  Diagnosis:    ICD-10-CM   1. 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  I met with the patient and spoke with his wife via speaker phone to again review the plan for radiotherapy and supportive services.  I inspected his surgery scars which are healed.     All relevant records and images related to the planned course of therapy were reviewed.  The patient freely provided informed written consent to proceed with treatment after reviewing the details related to the planned course of therapy. The consent form was witnessed and verified by the simulation staff.    The patient was laid in the supine position on the table. An Aquaplast head and shoulder mask was custom fitted to the patient's anatomy. High-resolution CT axial imaging was obtained of the head and neck. I verified that the quality of the imaging is good for treatment planning. 1 Medically Necessary Treatment Device was fabricated and supervised by me: Aquaplast mask.  Treatment planning note I plan to treat the patient with IMRT. I plan to treat the patient's tumor bed and bilateral neck nodes. I plan to treat to a total dose of 60 Gray in 30  fractions. Dose calculation was ordered from dosimetry.  IMRT planning Note  IMRT is medically necessary and an important modality to deliver adequate dose to the patient's at risk tissues while sparing the patient's normal structures, including the: esophagus, parotid tissue, mandible, brain stem, spinal cord, oral cavity, brachial plexus.  This justifies the use of IMRT in the patient's treatment.    -----------------------------------  Eppie Gibson, MD

## 2018-11-27 DIAGNOSIS — Z510 Encounter for antineoplastic radiation therapy: Secondary | ICD-10-CM | POA: Diagnosis not present

## 2018-11-28 ENCOUNTER — Telehealth: Payer: Self-pay | Admitting: Neurology

## 2018-11-28 NOTE — Telephone Encounter (Signed)
I haven't seen the patient since May of last year so he will need some type of visit (evisit, phone) for refills to update med hx.

## 2018-11-28 NOTE — Telephone Encounter (Signed)
Called patient to schedule Evisit; he is having surgery tomorrow and then starts radiation for 6 weeks 5xs a week. R/S him out until sept and added him to [facility] for when he's finished with radiation.   Patient was asking about Primidone refills and had some questions. He wanted to know if there were any left or if he needed to call into the office the next time he needed some to get a new refill sent in. Please update him with this. Thanks!

## 2018-11-30 ENCOUNTER — Telehealth: Payer: Self-pay | Admitting: *Deleted

## 2018-11-30 NOTE — Telephone Encounter (Signed)
Oncology Nurse Navigator Documentation  Spoke with patient wife s/p Friday's PEG placement at [facility].  She reported he tolerated procedure without difficulty, she is comfortable with management of PEG care and use.  I confirmed her understanding of Monday's 0930 RT New Start, encouraged his arrival to [facility] by 9:00 for registration to ensure arrival to Radiation Waiting by 9:15.  I informed her I have prepared a New Patient Packet for Mr. Siddiqi, will be given to him by RTT when he finished tmt.  I explained SLP Swallowing Exercise handout will be in front of R pocket, encouraged her to share/discuss with [facility] SLP during Monday afternoon tele-visit with [facility] SLP Monday. I encouraged them to call me with question/concerns as tmts continue.  [name], RN, BSN Head & Neck Oncology Nurse [place] at [place] [phone]

## 2018-12-02 ENCOUNTER — Ambulatory Visit
Admission: RE | Admit: 2018-12-02 | Discharge: 2018-12-02 | Disposition: A | Discharge status: Home / Self Care | Payer: Medicare Other | Source: Ambulatory Visit | Attending: Radiation Oncology | Admitting: Radiation Oncology

## 2018-12-02 ENCOUNTER — Other Ambulatory Visit: Payer: Self-pay

## 2018-12-02 DIAGNOSIS — Z510 Encounter for antineoplastic radiation therapy: Secondary | ICD-10-CM | POA: Diagnosis not present

## 2018-12-02 DIAGNOSIS — C01 Malignant neoplasm of base of tongue: Secondary | ICD-10-CM

## 2018-12-02 MED ORDER — SONAFINE EX EMUL
1.0000 "application " | Freq: Once | CUTANEOUS | Status: AC
  Administered 2018-12-02: 1 via TOPICAL

## 2018-12-02 NOTE — Progress Notes (Signed)

## 2018-12-03 ENCOUNTER — Encounter: Payer: Self-pay | Admitting: *Deleted

## 2018-12-03 ENCOUNTER — Other Ambulatory Visit: Payer: Self-pay

## 2018-12-03 ENCOUNTER — Ambulatory Visit
Admission: RE | Admit: 2018-12-03 | Discharge: 2018-12-03 | Disposition: A | Discharge status: Home / Self Care | Payer: Medicare Other | Source: Ambulatory Visit | Attending: Radiation Oncology | Admitting: Radiation Oncology

## 2018-12-03 ENCOUNTER — Ambulatory Visit: Payer: Medicare Other | Admitting: Nutrition

## 2018-12-03 DIAGNOSIS — Z510 Encounter for antineoplastic radiation therapy: Secondary | ICD-10-CM | POA: Diagnosis not present

## 2018-12-03 NOTE — Progress Notes (Signed)
RD working remotely.  Patient is a 73 year old male diagnosed with Tongue cancer receiving radiation treatment scheduled to complete on Monday, June 8.  PMH includes Prostate cancer, HTN, Dyslipedemia, Depression, Arthritis, Alzheimers, Feeding Tube.  Medications include Wellbutrin, Vitamin D, remeron, MVI, Omega 3 fatty acides. Prilosec, Miralax, Crestor.  Labs were reviewed.  Height: 5'7". Weight: 128.8 pounds April 15. UBW: 145 pounds January 2020. BMI: 20.17.  Patient reports he is healing well since surgery. Reports he is flushing feeding tube 1-2 times daily and denies problems with tube. He is eating a soft diet and drinks 2 oral nutrition supplements daily. He loves milk and drinks it with meals. Denies nausea, vomiting, diarrhea and constipation.  Nutrition Diagnosis: Unintentional weight loss related to tongue cancer and associated treatments as evidenced by 11% weight loss over 4 months which is significant.  Intervention: Educated patient to consume small, frequent meals and snacks consisting of high calorie and high protein foods. Continue milk with meals. Add Ensure Plus TID between meals. Will email fact sheets and send coupons to patient. Questions were answered. Contact information provided.  Monitoring, Evaluation, Goals: Patient will tolerate adequate calories and protein to minimize weight loss. Begin TF when oral intake decreases.  Next Visit: Tuesday, May 5.

## 2018-12-04 ENCOUNTER — Other Ambulatory Visit: Payer: Self-pay

## 2018-12-04 ENCOUNTER — Ambulatory Visit
Admission: RE | Admit: 2018-12-04 | Discharge: 2018-12-04 | Disposition: A | Discharge status: Home / Self Care | Payer: Medicare Other | Source: Ambulatory Visit | Attending: Radiation Oncology | Admitting: Radiation Oncology

## 2018-12-04 DIAGNOSIS — Z510 Encounter for antineoplastic radiation therapy: Secondary | ICD-10-CM | POA: Diagnosis not present

## 2018-12-05 ENCOUNTER — Encounter: Payer: Self-pay | Admitting: Family Medicine

## 2018-12-05 ENCOUNTER — Ambulatory Visit: Payer: Medicare Other

## 2018-12-06 ENCOUNTER — Other Ambulatory Visit: Payer: Self-pay

## 2018-12-06 ENCOUNTER — Ambulatory Visit
Admission: RE | Admit: 2018-12-06 | Discharge: 2018-12-06 | Disposition: A | Discharge status: Home / Self Care | Payer: Medicare Other | Source: Ambulatory Visit | Attending: Radiation Oncology | Admitting: Radiation Oncology

## 2018-12-06 DIAGNOSIS — C01 Malignant neoplasm of base of tongue: Secondary | ICD-10-CM | POA: Insufficient documentation

## 2018-12-06 DIAGNOSIS — Z510 Encounter for antineoplastic radiation therapy: Secondary | ICD-10-CM | POA: Diagnosis not present

## 2018-12-09 ENCOUNTER — Other Ambulatory Visit: Payer: Self-pay | Admitting: Radiation Oncology

## 2018-12-09 ENCOUNTER — Ambulatory Visit
Admission: RE | Admit: 2018-12-09 | Discharge: 2018-12-09 | Disposition: A | Discharge status: Home / Self Care | Payer: Medicare Other | Source: Ambulatory Visit | Attending: Radiation Oncology | Admitting: Radiation Oncology

## 2018-12-09 ENCOUNTER — Other Ambulatory Visit: Payer: Self-pay

## 2018-12-09 DIAGNOSIS — Z510 Encounter for antineoplastic radiation therapy: Secondary | ICD-10-CM | POA: Diagnosis not present

## 2018-12-09 DIAGNOSIS — C01 Malignant neoplasm of base of tongue: Secondary | ICD-10-CM

## 2018-12-09 MED ORDER — LIDOCAINE VISCOUS HCL 2 % MT SOLN: OROMUCOSAL | 5 refills | Status: DC

## 2018-12-10 ENCOUNTER — Ambulatory Visit
Admission: RE | Admit: 2018-12-10 | Discharge: 2018-12-10 | Disposition: A | Discharge status: Home / Self Care | Payer: Medicare Other | Source: Ambulatory Visit | Attending: Radiation Oncology | Admitting: Radiation Oncology

## 2018-12-10 ENCOUNTER — Other Ambulatory Visit: Payer: Self-pay

## 2018-12-10 ENCOUNTER — Inpatient Hospital Stay: Payer: Medicare Other | Attending: Hematology | Admitting: Nutrition

## 2018-12-10 DIAGNOSIS — Z510 Encounter for antineoplastic radiation therapy: Secondary | ICD-10-CM | POA: Diagnosis not present

## 2018-12-10 NOTE — Progress Notes (Signed)
RD working remotely.  Nutrition follow up completed with patient receiving radiation treatment for Tongue Cancer. Treatment is scheduled to complete on Monday, June 8. Patient reports his weight was 128 pounds when weighed this week and the cancer center, stable from April 15 weight. Patient denies nutrition impact symptoms. He continues to eat 3 meals with snacks between meals. He is consuming Boost and Ensure and appreciative of coupons sent last week.  Nutrition Diagnosis: Unintentional weight loss stable.  Intervention: Educated to continue high calorie, high protein meals and snacks. Continue oral nutrition supplements and milk with meals. Questions answered. Patient has my contact information.  Monitoring, Evaluation, Goals: Patient will consume adequate calories and protein for weight maintenance.  Next Visit: Tuesday, May 12.

## 2018-12-11 ENCOUNTER — Ambulatory Visit
Admission: RE | Admit: 2018-12-11 | Discharge: 2018-12-11 | Disposition: A | Discharge status: Home / Self Care | Payer: Medicare Other | Source: Ambulatory Visit | Attending: Radiation Oncology | Admitting: Radiation Oncology

## 2018-12-11 ENCOUNTER — Other Ambulatory Visit: Payer: Self-pay

## 2018-12-11 DIAGNOSIS — Z510 Encounter for antineoplastic radiation therapy: Secondary | ICD-10-CM | POA: Diagnosis not present

## 2018-12-12 ENCOUNTER — Other Ambulatory Visit: Payer: Self-pay

## 2018-12-12 ENCOUNTER — Ambulatory Visit
Admission: RE | Admit: 2018-12-12 | Discharge: 2018-12-12 | Disposition: A | Discharge status: Home / Self Care | Payer: Medicare Other | Source: Ambulatory Visit | Attending: Radiation Oncology | Admitting: Radiation Oncology

## 2018-12-12 DIAGNOSIS — Z510 Encounter for antineoplastic radiation therapy: Secondary | ICD-10-CM | POA: Diagnosis not present

## 2018-12-13 ENCOUNTER — Ambulatory Visit
Admission: RE | Admit: 2018-12-13 | Discharge: 2018-12-13 | Disposition: A | Discharge status: Home / Self Care | Payer: Medicare Other | Source: Ambulatory Visit | Attending: Radiation Oncology | Admitting: Radiation Oncology

## 2018-12-13 ENCOUNTER — Other Ambulatory Visit: Payer: Self-pay

## 2018-12-13 DIAGNOSIS — Z510 Encounter for antineoplastic radiation therapy: Secondary | ICD-10-CM | POA: Diagnosis not present

## 2018-12-16 ENCOUNTER — Other Ambulatory Visit: Payer: Self-pay

## 2018-12-16 ENCOUNTER — Ambulatory Visit
Admission: RE | Admit: 2018-12-16 | Discharge: 2018-12-16 | Disposition: A | Discharge status: Home / Self Care | Payer: Medicare Other | Source: Ambulatory Visit | Attending: Radiation Oncology | Admitting: Radiation Oncology

## 2018-12-16 DIAGNOSIS — Z510 Encounter for antineoplastic radiation therapy: Secondary | ICD-10-CM | POA: Diagnosis not present

## 2018-12-17 ENCOUNTER — Inpatient Hospital Stay: Payer: Medicare Other | Admitting: Nutrition

## 2018-12-17 ENCOUNTER — Other Ambulatory Visit: Payer: Self-pay

## 2018-12-17 ENCOUNTER — Ambulatory Visit
Admission: RE | Admit: 2018-12-17 | Discharge: 2018-12-17 | Disposition: A | Discharge status: Home / Self Care | Payer: Medicare Other | Source: Ambulatory Visit | Attending: Radiation Oncology | Admitting: Radiation Oncology

## 2018-12-17 DIAGNOSIS — Z510 Encounter for antineoplastic radiation therapy: Secondary | ICD-10-CM | POA: Diagnosis not present

## 2018-12-17 NOTE — Progress Notes (Signed)
RD working remotely.  Nutrition follow up completed with patient and wife. Scheduled to complete radiation treatment for Tongue Cancer on Tuesday, June 9. Reports saliva is thicker and more difficult to expel. He is feeling more fatigue. He is beginning to have some difficulty chewing but it hasn't changed his intake. Weight increased slightly to 129.4 pounds from 128.8 pounds and patient is pleased. Reports he is "chugging along".  Nutrition Diagnosis: Unintentional weight loss improved.  Intervention:  Continue high calorie shakes and soft foods frequently throughout the day. Will mail coupons. Educated on strategies for increased, thick saliva. Will email fact sheet.  Monitoring, Evaluation, Goals: Continue increased intake to minimize weight loss.  Next Visit: Tuesday, May 19.

## 2018-12-17 NOTE — Progress Notes (Signed)
Virtual Visit via Video Note The purpose of this virtual visit is to provide medical care while limiting exposure to the novel coronavirus.    Consent was obtained for video visit:  Yes.   Answered questions that patient had about telehealth interaction:  Yes.   I discussed the limitations, risks, security and privacy concerns of performing an evaluation and management service by telemedicine. I also discussed with the patient that there may be a patient responsible charge related to this service. The patient expressed understanding and agreed to proceed.  Pt location: Home Physician Location: office Name of referring provider:  Denita Lung, MD I connected with [name] at patients initiation/request on 12/19/2018 at  2:30 PM EDT by video enabled telemedicine application and verified that I am speaking with the correct person using two identifiers. Pt MRN:  [id number] Pt DOB:  September 24, 1945 Video Participants:  [name];  Wife, [name]   History of Present Illness:  Patient seen today in follow-up for essential tremor.  When I last saw him, I increased his primidone to 50 mg twice per day.   Tremors have "not been as frequent as I have anticipated.  They have maybe been twice per week."   We also started him on donepezil at the time.  He is still on that medication.  He is tolerating it well.  He states that memory has been "downhill."  He states that he is retaining less.  He gives me an examples of being in the hospital and having surgery for his cancer (was on pain medication) and doesn't remember the 11 days.  I have reviewed records since our last visit.  Unfortunately, he has recently been diagnosed with tongue cancer.  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  He has 3 weeks left of radiation.  Current Outpatient Medications on File Prior to Visit  Medication Sig Dispense Refill  . buPROPion (WELLBUTRIN XL) 150 MG 24 hr tablet 3   qam 90  tablet 3  . HYDROcodone-acetaminophen (NORCO/VICODIN) 5-325 MG tablet Take 1 tablet by mouth every 6 (six) hours as needed for moderate pain. For up to 5 days    . lidocaine (XYLOCAINE) 2 % solution Patient: Mix 1part 2% viscous lidocaine, 1part H20. Swish & swallow 71mL of diluted mixture, 41min before meals and at bedtime, up to QID 100 mL 5  . mirtazapine (REMERON SOLTAB) 30 MG disintegrating tablet Take 1 tablet (30 mg total) by mouth at bedtime. 30 tablet 5  . ondansetron (ZOFRAN-ODT) 4 MG disintegrating tablet Take 4 mg by mouth every 8 (eight) hours as needed for nausea.    . polyethylene glycol powder (GLYCOLAX/MIRALAX) powder Take 17 g by mouth daily. 3350 g 11  . rosuvastatin (CRESTOR) 20 MG tablet TAKE 1 TABLET(20 MG) BY MOUTH DAILY 90 tablet 0  . sodium fluoride (PREVIDENT 5000 PLUS) 1.1 % CREA dental cream Apply cream to tooth brush. Brush teeth for 2 minutes. Spit out excess. DO NOT rinse afterwards. Repeat nightly. 1 Tube prn  . terazosin (HYTRIN) 2 MG capsule TAKE 1 CAPSULE(2 MG) BY MOUTH DAILY 90 capsule 1  . triamcinolone (NASACORT ALLERGY 24HR) 55 MCG/ACT AERO nasal inhaler Place 1 spray into the nose daily.    Marland Kitchen vortioxetine HBr (TRINTELLIX) 20 MG TABS tablet Take 1 tablet (20 mg total) by mouth daily. 90 tablet 1   No current facility-administered medications on file prior to visit.  Observations/Objective:   Vitals:   12/19/18 1226  Weight: 129 lb (58.5 kg)  Height: 5\' 7"  (1.702 m)   GEN:  The patient appears stated age and is in NAD.  Neurological examination:  Orientation: The patient is alert and oriented x3. Cranial nerves: There is good facial symmetry. There is no facial hypomimia.  The speech is fluent and clear. Soft palate rises symmetrically and there is no tongue deviation. Hearing is intact to conversational tone. Motor: Strength is at least antigravity x 4.   Shoulder shrug is equal and symmetric.  There is no pronator drift.  Movement  examination: Tone: unable Abnormal movements: Very minimal tremor of the outstretched hands.  He has fairly good Archimedes spirals on the left.  He has less tight Archimedes spirals on the right, but not a lot of tremor associated with it. Coordination:  There is no decremation with RAM's, with any form of RAMS, including alternating supination and pronation of the forearm, hand opening and closing, finger taps bilaterally Gait and Station: The patient walks well in his kitchen.    Chemistry      Component Value Date/Time   NA 141 09/19/2018 0757   NA 140 07/26/2018 1435   K 4.2 09/19/2018 0757   CL 104 09/19/2018 0757   CO2 30 09/19/2018 0757   BUN 18 09/19/2018 0757   BUN 15 07/26/2018 1435   CREATININE 0.99 09/19/2018 0757   CREATININE 0.84 06/08/2016 1425      Component Value Date/Time   CALCIUM 9.7 09/19/2018 0757   ALKPHOS 52 09/19/2018 0757   AST 19 09/19/2018 0757   ALT 18 09/19/2018 0757   BILITOT 0.5 09/19/2018 0757     Lab Results  Component Value Date   TSH 2.057 09/19/2018     Assessment and Plan:   1.  Essential tremor             -Doing well with primidone, 50 mg twice per day.  I sent a prescription in to the pharmacy. 2.  Alzheimer's dementia             -Neurocognitive testing in December, 2017 did not reveal evidence of dementia, but there was mild cognitive impairment, which was concerning for a prodromal Alzheimer's disease based on areas of impairment.  Repeat neurocognitive testing in October, 2019 revealed evidence of mild Alzheimer's dementia.              -Continue donepezil, 10 mg daily.  Prescription sent to the pharmacy today.  -Discussed concept of both physical and mental exercises.  He asked me exactly what this meant and gave him examples of exercises to do.  -Patient did get worse when he was in the hospital for his surgery for his tongue cancer, but he was on narcotic medications, and explained to him that this is expected.  He got better  once he was off of them.  He is still undergoing radiation.   Follow Up Instructions:  Follow-up 9 months, sooner should new neurologic issues arise.  -I discussed the assessment and treatment plan with the patient. The patient was provided an opportunity to ask questions and all were answered. The patient agreed with the plan and demonstrated an understanding of the instructions.   The patient was advised to call back or seek an in-person evaluation if the symptoms worsen or if the condition fails to improve as anticipated.    Total Time spent in visit with the patient was: 25, of which more than  50% of the time was spent in counseling, as above.  Pt understands and agrees with the plan of care outlined.     Alonza Bogus, DO

## 2018-12-18 ENCOUNTER — Other Ambulatory Visit: Payer: Self-pay

## 2018-12-18 ENCOUNTER — Ambulatory Visit
Admission: RE | Admit: 2018-12-18 | Discharge: 2018-12-18 | Disposition: A | Discharge status: Home / Self Care | Payer: Medicare Other | Source: Ambulatory Visit | Attending: Radiation Oncology | Admitting: Radiation Oncology

## 2018-12-18 DIAGNOSIS — Z510 Encounter for antineoplastic radiation therapy: Secondary | ICD-10-CM | POA: Diagnosis not present

## 2018-12-19 ENCOUNTER — Ambulatory Visit
Admission: RE | Admit: 2018-12-19 | Discharge: 2018-12-19 | Disposition: A | Discharge status: Home / Self Care | Payer: Medicare Other | Source: Ambulatory Visit | Attending: Radiation Oncology | Admitting: Radiation Oncology

## 2018-12-19 ENCOUNTER — Other Ambulatory Visit: Payer: Self-pay

## 2018-12-19 ENCOUNTER — Encounter: Payer: Self-pay | Admitting: *Deleted

## 2018-12-19 ENCOUNTER — Telehealth (INDEPENDENT_AMBULATORY_CARE_PROVIDER_SITE_OTHER): Payer: Medicare Other | Admitting: Neurology

## 2018-12-19 ENCOUNTER — Ambulatory Visit: Payer: Medicare Other | Admitting: Neurology

## 2018-12-19 ENCOUNTER — Encounter: Payer: Self-pay | Admitting: Neurology

## 2018-12-19 DIAGNOSIS — F0280 Dementia in other diseases classified elsewhere without behavioral disturbance: Secondary | ICD-10-CM | POA: Diagnosis not present

## 2018-12-19 DIAGNOSIS — G301 Alzheimer's disease with late onset: Secondary | ICD-10-CM

## 2018-12-19 DIAGNOSIS — Z510 Encounter for antineoplastic radiation therapy: Secondary | ICD-10-CM | POA: Diagnosis not present

## 2018-12-19 DIAGNOSIS — G250 Essential tremor: Secondary | ICD-10-CM

## 2018-12-19 MED ORDER — PRIMIDONE 50 MG PO TABS: 50.0000 mg | ORAL_TABLET | Freq: Two times a day (BID) | ORAL | 2 refills | Status: DC

## 2018-12-19 MED ORDER — DONEPEZIL HCL 10 MG PO TABS: 10.0000 mg | ORAL_TABLET | Freq: Every day | ORAL | 2 refills | Status: DC

## 2018-12-20 ENCOUNTER — Ambulatory Visit
Admission: RE | Admit: 2018-12-20 | Discharge: 2018-12-20 | Disposition: A | Discharge status: Home / Self Care | Payer: Medicare Other | Source: Ambulatory Visit | Attending: Radiation Oncology | Admitting: Radiation Oncology

## 2018-12-20 ENCOUNTER — Other Ambulatory Visit: Payer: Self-pay

## 2018-12-20 DIAGNOSIS — Z510 Encounter for antineoplastic radiation therapy: Secondary | ICD-10-CM | POA: Diagnosis not present

## 2018-12-23 ENCOUNTER — Other Ambulatory Visit: Payer: Self-pay

## 2018-12-23 ENCOUNTER — Ambulatory Visit
Admission: RE | Admit: 2018-12-23 | Discharge: 2018-12-23 | Disposition: A | Discharge status: Home / Self Care | Payer: Medicare Other | Source: Ambulatory Visit | Attending: Radiation Oncology | Admitting: Radiation Oncology

## 2018-12-23 DIAGNOSIS — Z510 Encounter for antineoplastic radiation therapy: Secondary | ICD-10-CM | POA: Diagnosis not present

## 2018-12-24 ENCOUNTER — Ambulatory Visit
Admission: RE | Admit: 2018-12-24 | Discharge: 2018-12-24 | Disposition: A | Discharge status: Home / Self Care | Payer: Medicare Other | Source: Ambulatory Visit | Attending: Radiation Oncology | Admitting: Radiation Oncology

## 2018-12-24 ENCOUNTER — Other Ambulatory Visit: Payer: Self-pay

## 2018-12-24 ENCOUNTER — Inpatient Hospital Stay: Payer: Medicare Other | Admitting: Nutrition

## 2018-12-24 DIAGNOSIS — Z510 Encounter for antineoplastic radiation therapy: Secondary | ICD-10-CM | POA: Diagnosis not present

## 2018-12-24 NOTE — Progress Notes (Signed)
RD working remotely.  Nutrition follow up completed with patient and wife. Reports decreased appetite and taste alterations. He is moving between constipation and diarrhea. No vomiting and nausea. Reports his weight is stable.  Nutrition Diagnosis: Unintentional weight loss stable per patient.  Intervention: Educated patient on strategies for taste alterations. Recommended baking soda and salt water rinses. Encouraged small, frequent meals and snacks. Increase ONS to 4 daily.  Monitoring, Evaluation, Goals: Increased oral intake to maintain weight.  Next Visit: Tuesday, May 26.

## 2018-12-25 ENCOUNTER — Ambulatory Visit
Admission: RE | Admit: 2018-12-25 | Discharge: 2018-12-25 | Disposition: A | Discharge status: Home / Self Care | Payer: Medicare Other | Source: Ambulatory Visit | Attending: Radiation Oncology | Admitting: Radiation Oncology

## 2018-12-25 ENCOUNTER — Other Ambulatory Visit: Payer: Self-pay

## 2018-12-25 DIAGNOSIS — Z510 Encounter for antineoplastic radiation therapy: Secondary | ICD-10-CM | POA: Diagnosis not present

## 2018-12-26 ENCOUNTER — Other Ambulatory Visit: Payer: Self-pay

## 2018-12-26 ENCOUNTER — Ambulatory Visit
Admission: RE | Admit: 2018-12-26 | Discharge: 2018-12-26 | Disposition: A | Discharge status: Home / Self Care | Payer: Medicare Other | Source: Ambulatory Visit | Attending: Radiation Oncology | Admitting: Radiation Oncology

## 2018-12-26 DIAGNOSIS — Z510 Encounter for antineoplastic radiation therapy: Secondary | ICD-10-CM | POA: Diagnosis not present

## 2018-12-27 ENCOUNTER — Ambulatory Visit
Admission: RE | Admit: 2018-12-27 | Discharge: 2018-12-27 | Disposition: A | Discharge status: Home / Self Care | Payer: Medicare Other | Source: Ambulatory Visit | Attending: Radiation Oncology | Admitting: Radiation Oncology

## 2018-12-27 ENCOUNTER — Other Ambulatory Visit: Payer: Self-pay

## 2018-12-27 DIAGNOSIS — Z510 Encounter for antineoplastic radiation therapy: Secondary | ICD-10-CM | POA: Diagnosis not present

## 2018-12-27 DIAGNOSIS — C01 Malignant neoplasm of base of tongue: Secondary | ICD-10-CM

## 2018-12-27 MED ORDER — SONAFINE EX EMUL
1.0000 "application " | Freq: Once | CUTANEOUS | Status: AC
  Administered 2018-12-27: 1 via TOPICAL

## 2018-12-31 ENCOUNTER — Other Ambulatory Visit: Payer: Self-pay

## 2018-12-31 ENCOUNTER — Inpatient Hospital Stay: Payer: Medicare Other | Admitting: Nutrition

## 2018-12-31 ENCOUNTER — Ambulatory Visit
Admission: RE | Admit: 2018-12-31 | Discharge: 2018-12-31 | Disposition: A | Discharge status: Home / Self Care | Payer: Medicare Other | Source: Ambulatory Visit | Attending: Radiation Oncology | Admitting: Radiation Oncology

## 2018-12-31 DIAGNOSIS — Z510 Encounter for antineoplastic radiation therapy: Secondary | ICD-10-CM | POA: Diagnosis not present

## 2018-12-31 NOTE — Progress Notes (Signed)
RD working remotely.  Nutrition follow up completed with patient and wife on telephone. Receiving radiation treatment for Tongue cancer. Patient reports food tastes like cardboard. He has been trying to drink 3 ensure plus daily but states he will try to increase ONS. States weight decreased 3 pounds this week.  Final radiation treatment scheduled for June 9.  Nutrition Diagnosis: Unintentional weight loss continues.  Intervention: Educated patient to increase oral nutrition supplements to 4-5 daily. Continue to eat high calorie, high protein foods. Work on [employer] maintenance.  Monitoring, Evaluation, Goals: Patient will increase calories and protein to minimize weight loss.  Next Visit: Tuesday, June 2.

## 2019-01-01 ENCOUNTER — Ambulatory Visit
Admission: RE | Admit: 2019-01-01 | Discharge: 2019-01-01 | Disposition: A | Discharge status: Home / Self Care | Payer: Medicare Other | Source: Ambulatory Visit | Attending: Radiation Oncology | Admitting: Radiation Oncology

## 2019-01-01 DIAGNOSIS — Z510 Encounter for antineoplastic radiation therapy: Secondary | ICD-10-CM | POA: Diagnosis not present

## 2019-01-02 ENCOUNTER — Other Ambulatory Visit: Payer: Self-pay

## 2019-01-02 ENCOUNTER — Ambulatory Visit
Admission: RE | Admit: 2019-01-02 | Discharge: 2019-01-02 | Disposition: A | Discharge status: Home / Self Care | Payer: Medicare Other | Source: Ambulatory Visit | Attending: Radiation Oncology | Admitting: Radiation Oncology

## 2019-01-02 DIAGNOSIS — Z510 Encounter for antineoplastic radiation therapy: Secondary | ICD-10-CM | POA: Diagnosis not present

## 2019-01-03 ENCOUNTER — Other Ambulatory Visit: Payer: Self-pay

## 2019-01-03 ENCOUNTER — Ambulatory Visit
Admission: RE | Admit: 2019-01-03 | Discharge: 2019-01-03 | Disposition: A | Discharge status: Home / Self Care | Payer: Medicare Other | Source: Ambulatory Visit | Attending: Radiation Oncology | Admitting: Radiation Oncology

## 2019-01-03 ENCOUNTER — Other Ambulatory Visit: Payer: Self-pay | Admitting: Family Medicine

## 2019-01-03 DIAGNOSIS — Z510 Encounter for antineoplastic radiation therapy: Secondary | ICD-10-CM | POA: Diagnosis not present

## 2019-01-03 DIAGNOSIS — E785 Hyperlipidemia, unspecified: Secondary | ICD-10-CM

## 2019-01-06 ENCOUNTER — Ambulatory Visit
Admission: RE | Admit: 2019-01-06 | Discharge: 2019-01-06 | Disposition: A | Discharge status: Home / Self Care | Payer: Medicare Other | Source: Ambulatory Visit | Attending: Radiation Oncology | Admitting: Radiation Oncology

## 2019-01-06 ENCOUNTER — Telehealth: Payer: Self-pay | Admitting: Family Medicine

## 2019-01-06 ENCOUNTER — Other Ambulatory Visit: Payer: Self-pay

## 2019-01-06 DIAGNOSIS — C01 Malignant neoplasm of base of tongue: Secondary | ICD-10-CM | POA: Diagnosis not present

## 2019-01-06 DIAGNOSIS — Z510 Encounter for antineoplastic radiation therapy: Secondary | ICD-10-CM | POA: Diagnosis present

## 2019-01-06 DIAGNOSIS — E785 Hyperlipidemia, unspecified: Secondary | ICD-10-CM

## 2019-01-06 MED ORDER — ROSUVASTATIN CALCIUM 20 MG PO TABS: ORAL_TABLET | ORAL | 0 refills | Status: DC

## 2019-01-06 NOTE — Telephone Encounter (Signed)
[facility] req refill of Rosuvastatin 20 mg #90

## 2019-01-06 NOTE — Telephone Encounter (Signed)
Med sent to [facility]. [place]

## 2019-01-07 ENCOUNTER — Telehealth (HOSPITAL_COMMUNITY): Payer: Self-pay | Admitting: Psychiatry

## 2019-01-07 ENCOUNTER — Inpatient Hospital Stay: Payer: Medicare Other | Attending: Hematology | Admitting: Nutrition

## 2019-01-07 ENCOUNTER — Other Ambulatory Visit: Payer: Self-pay

## 2019-01-07 ENCOUNTER — Ambulatory Visit
Admission: RE | Admit: 2019-01-07 | Discharge: 2019-01-07 | Disposition: A | Discharge status: Home / Self Care | Payer: Medicare Other | Source: Ambulatory Visit | Attending: Radiation Oncology | Admitting: Radiation Oncology

## 2019-01-07 DIAGNOSIS — Z510 Encounter for antineoplastic radiation therapy: Secondary | ICD-10-CM | POA: Diagnosis not present

## 2019-01-07 NOTE — Progress Notes (Signed)
Nutrition follow-up completed with patient over the telephone. Patient reports he continues to be unable to taste food. He has 5 more radiation treatments left for tongue cancer.  Final radiation treatment is scheduled for June 9. He continues to drink 3 Ensure Plus a day on average.  Reports he has enough coupons for now. He reports increased fatigue. He has no other nutrition concerns at this time.  Nutrition diagnosis: Unintentional weight loss stabilized.  Intervention: Provided support and encouragement for patient to continue strategies for increased calories and protein. Continue oral nutrition supplements at least 3 times daily.  Monitoring, evaluation, goals: Patient will tolerate increased calories and protein to minimize further weight loss.  Next visit: Patient prefers to contact me for follow-up.  **Disclaimer: This note was dictated with voice recognition software. Similar sounding words can inadvertently be transcribed and this note may contain transcription errors which may not have been corrected upon publication of note.**

## 2019-01-08 ENCOUNTER — Ambulatory Visit
Admission: RE | Admit: 2019-01-08 | Discharge: 2019-01-08 | Disposition: A | Discharge status: Home / Self Care | Payer: Medicare Other | Source: Ambulatory Visit | Attending: Radiation Oncology | Admitting: Radiation Oncology

## 2019-01-08 ENCOUNTER — Ambulatory Visit (HOSPITAL_BASED_OUTPATIENT_CLINIC_OR_DEPARTMENT_OTHER): Payer: Medicare Other | Admitting: Psychiatry

## 2019-01-08 ENCOUNTER — Other Ambulatory Visit: Payer: Self-pay

## 2019-01-08 DIAGNOSIS — F324 Major depressive disorder, single episode, in partial remission: Secondary | ICD-10-CM | POA: Diagnosis not present

## 2019-01-08 DIAGNOSIS — Z510 Encounter for antineoplastic radiation therapy: Secondary | ICD-10-CM | POA: Diagnosis not present

## 2019-01-08 MED ORDER — MIRTAZAPINE 30 MG PO TBDP: 30.0000 mg | ORAL_TABLET | Freq: Every day | ORAL | 5 refills | Status: DC

## 2019-01-08 NOTE — Progress Notes (Signed)
.  Psychiatric Initial Adult Assessment   Patient Identification: [name] MRN:  [id number] Date of Evaluation:  01/08/2019 Referral Source: Dr. Wells Guiles Tat Chief Complaint:   Visit Diagnosis: Major depression mild  History of Present Illness: This patient is being treated in this office for major depression mild.  Today the patient seemed to be in a fairly good mood.  His wife [name] said he still irritable and is sometimes even worse.  The good news is the Remeron is clearly helped his sleep.  Sleeping better I would hope he be less irritable.  He really denies daily depression.  He denies anhedonia.  He enjoys watching TV, reading and music.  He has 2 cats.  He enjoys everything.  He denies being anxious.  He denies worthlessness.  His thinking concentration in this interview seem to be within normal limits.  He generally says unless he is having a bad day he has no problem.  He certainly is not suicidal.  He denies being worthless.  He does not really have lots of somatic complaints and he certainly is not suspicious.  The patient shows no evidence of psychosis.  He denies chest pain shortness of breath or any respiratory symptoms.  Patient has 4 more treatments for radiation.  After that was sent down with his oncologist to determine what the next step may be.  Overall though the patient is not in any pain.  He actually is able to swallow.  He says his lost taste for things.  He is not having pain when swallowing.  I do believe he is lost weight.  The patient overall is functioning fairly well.  He is got a good relationship with his wife [name].  Depression Symptoms:  fatigue, (Hypo) Manic Symptoms:   Anxiety Symptoms:   Psychotic Symptoms:   PTSD Symptoms:   Past Psychiatric History: Trintellix, Wellbutrin  Previous Psychotropic Medications:   Substance Abuse History in the last 12 months:    Consequences of Substance Abuse:   Past Medical History:  Past Medical History:   Diagnosis Date  . Allergy   . Alzheimer disease ([place]) 06/2018   diagnosed with early stage alzheimers  . Arthritis    osteoarthritis  . Cancer [facility])    testicular, age 81, orchiectomy 34  . Cataract    B/L CATARACTS NO SURGERY  . Dementia ([place])   . Depression   . Dyslipidemia   . Heartburn    TAKES ZANTAC  . Hypertension   . Hypogonadism male   . Prostate cancer ([place]) 06/07/11   gleason 3+3=6, vol 59.5 cc  . Sleep apnea   . Status post chemotherapy    testicular cancer 1992, Dr Beryle Beams    Past Surgical History:  Procedure Laterality Date  . APPENDECTOMY    . COLONOSCOPY  2007   gessner  . DIRECT LARYNGOSCOPY N/A 08/19/2018   Procedure: DIRECT LARYNGOSCOPY;  Surgeon: Leta Baptist, MD;  Location: [facility];  Service: ENT;  Laterality: N/A;  . INGUINAL HERNIA REPAIR     w/orchiectomy 1992, retroperitoneal lymph node dissection  . MENISECTOMY     R&L knee surgeries  . PROSTATE SURGERY     biopsy x 2  . TONGUE BIOPSY N/A 08/19/2018   Procedure: BIOPSY OF TONGUE BASE MASS;  Surgeon: Leta Baptist, MD;  Location: [place] [facility];  Service: ENT;  Laterality: N/A;    Family Psychiatric History:   Family History:  Family History  Problem Relation Age of Onset  .  Lung cancer Mother   . Hypertension Mother   . Lung cancer Father   . Heart failure Father   . Hypertension Father     Social History:   Social History   Socioeconomic History  . Marital status: Married    Spouse name: Not on file  . Number of children: 0  . Years of education: Not on file  . Highest education level: Not on file  Occupational History  . Occupation: RETIRED    Employer: [employer]  Social Needs  . Financial resource strain: Not on file  . Food insecurity:    Worry: Not on file    Inability: Not on file  . Transportation needs:    Medical: No    Non-medical: No  Tobacco Use  . Smoking status: Former Smoker    Packs/day: 1.00    Years: 20.00     Pack years: 20.00    Types: Cigarettes    Last attempt to quit: 08/03/1999    Years since quitting: 19.4  . Smokeless tobacco: Never Used  Substance and Sexual Activity  . Alcohol use: Not Currently    Alcohol/week: 6.0 standard drinks    Types: 6 Cans of beer per week  . Drug use: No  . Sexual activity: Yes  Lifestyle  . Physical activity:    Days per week: Not on file    Minutes per session: Not on file  . Stress: Not on file  Relationships  . Social connections:    Talks on phone: Not on file    Gets together: Not on file    Attends religious service: Not on file    Active member of club or organization: Not on file    Attends meetings of clubs or organizations: Not on file    Relationship status: Not on file  Other Topics Concern  . Not on file  Social History Narrative  . Not on file    Additional Social History:   Allergies:  No Known Allergies  Metabolic Disorder Labs: No results found for: HGBA1C, MPG No results found for: PROLACTIN Lab Results  Component Value Date   CHOL 146 09/17/2017   TRIG 63 09/17/2017   HDL 63 09/17/2017   CHOLHDL 2.3 09/17/2017   VLDL 18 09/11/2016   LDLCALC 70 09/17/2017   LDLCALC 67 09/11/2016   Lab Results  Component Value Date   TSH 2.057 09/19/2018    Therapeutic Level Labs: No results found for: LITHIUM No results found for: CBMZ No results found for: VALPROATE  Current Medications: Current Outpatient Medications  Medication Sig Dispense Refill  . buPROPion (WELLBUTRIN XL) 150 MG 24 hr tablet 3   qam 90 tablet 3  . donepezil (ARICEPT) 10 MG tablet Take 1 tablet (10 mg total) by mouth at bedtime. 90 tablet 2  . HYDROcodone-acetaminophen (NORCO/VICODIN) 5-325 MG tablet Take 1 tablet by mouth every 6 (six) hours as needed for moderate pain. For up to 5 days    . lidocaine (XYLOCAINE) 2 % solution Patient: Mix 1part 2% viscous lidocaine, 1part H20. Swish & swallow 98mL of diluted mixture, 79min before meals and at  bedtime, up to QID 100 mL 5  . mirtazapine (REMERON SOLTAB) 30 MG disintegrating tablet Take 1 tablet (30 mg total) by mouth at bedtime. 30 tablet 5  . ondansetron (ZOFRAN-ODT) 4 MG disintegrating tablet Take 4 mg by mouth every 8 (eight) hours as needed for nausea.    . polyethylene glycol powder (GLYCOLAX/MIRALAX) powder Take  17 g by mouth daily. 3350 g 11  . primidone (MYSOLINE) 50 MG tablet Take 1 tablet (50 mg total) by mouth 2 (two) times daily. 180 tablet 2  . rosuvastatin (CRESTOR) 20 MG tablet TAKE 1 TABLET(20 MG) BY MOUTH DAILY 90 tablet 0  . sodium fluoride (PREVIDENT 5000 PLUS) 1.1 % CREA dental cream Apply cream to tooth brush. Brush teeth for 2 minutes. Spit out excess. DO NOT rinse afterwards. Repeat nightly. 1 Tube prn  . terazosin (HYTRIN) 2 MG capsule TAKE 1 CAPSULE(2 MG) BY MOUTH DAILY 90 capsule 1  . triamcinolone (NASACORT ALLERGY 24HR) 55 MCG/ACT AERO nasal inhaler Place 1 spray into the nose daily.    Marland Kitchen vortioxetine HBr (TRINTELLIX) 20 MG TABS tablet Take 1 tablet (20 mg total) by mouth daily. 90 tablet 1   No current facility-administered medications for this visit.     Musculoskeletal: Strength & Muscle Tone: within normal limits Gait & Station: normal Patient leans: N/A  Psychiatric Specialty Exam: ROS  There were no vitals taken for this visit.There is no height or weight on file to calculate BMI.  General Appearance: Casual  Eye Contact:  Good  Speech:  Normal Rate  Volume:  Normal  Mood:  Negative  Affect:  Congruent  Thought Process:  Goal Directed  Orientation:  Full (Time, Place, and Person)  Thought Content:  Logical  Suicidal Thoughts:  No  Homicidal Thoughts:  No  Memory:  NA  Judgement:  Good  Insight:  Fair  Psychomotor Activity:  Normal  Concentration:    Recall:  [place] of Knowledge:Good  Language: Good  Akathisia:  No  Handed:  Right  AIMS (if indicated):  not done  Assets:  Desire for Improvement Financial Resources/Insurance   ADL's:  Intact  Cognition: Impaired,  Moderate  Sleep:     Screenings: Mini-Mental     Office Visit from 06/08/2016 in [facility]  Total Score (max 30 points )  28    PHQ2-9     Office Visit from 09/18/2018 in [place] Visit from 09/17/2017 in [facility] Visit from 09/11/2016 in [facility] Visit from 08/31/2015 in [facility] Visit from 08/24/2014 in [facility]  PHQ-2 Total Score  6  4  4  6  6   PHQ-9 Total Score  19  10  -  16  -      Assessment and Plan:   This patient is #1 problem is likely that of major depression.  He failed Wellbutrin and we started Remeron which has helped him sleep.  It is hard to determine if it had a big effect on his mood.  His appetite is actually fairly good.  His irritability still seems to be an issue but it is very focused to be expressed towards his wife.  It is not that his wife is making him irritable but I think she is a safe place for him to discharge his anger and is distressed about having squamous cell carcinoma and requires so much treatment.  Note is that he is not a irritable or angry with the nurses or his doctors.  Is mainly related to his wife as she will allow him to be irritable.  This patient will be reevaluated in approximately 1 month.  The patient second problem is that of insomnia which seems to be fairly well treated at this time. Jerral Ralph, MD 6/3/20202:16 PM

## 2019-01-09 ENCOUNTER — Other Ambulatory Visit: Payer: Self-pay

## 2019-01-09 ENCOUNTER — Ambulatory Visit
Admission: RE | Admit: 2019-01-09 | Discharge: 2019-01-09 | Disposition: A | Discharge status: Home / Self Care | Payer: Medicare Other | Source: Ambulatory Visit | Attending: Radiation Oncology | Admitting: Radiation Oncology

## 2019-01-09 DIAGNOSIS — Z510 Encounter for antineoplastic radiation therapy: Secondary | ICD-10-CM | POA: Diagnosis not present

## 2019-01-10 ENCOUNTER — Ambulatory Visit
Admission: RE | Admit: 2019-01-10 | Discharge: 2019-01-10 | Disposition: A | Discharge status: Home / Self Care | Payer: Medicare Other | Source: Ambulatory Visit | Attending: Radiation Oncology | Admitting: Radiation Oncology

## 2019-01-10 ENCOUNTER — Other Ambulatory Visit: Payer: Self-pay

## 2019-01-10 DIAGNOSIS — Z510 Encounter for antineoplastic radiation therapy: Secondary | ICD-10-CM | POA: Diagnosis not present

## 2019-01-13 ENCOUNTER — Ambulatory Visit: Payer: Medicare Other

## 2019-01-13 ENCOUNTER — Ambulatory Visit
Admission: RE | Admit: 2019-01-13 | Discharge: 2019-01-13 | Disposition: A | Discharge status: Home / Self Care | Payer: Medicare Other | Source: Ambulatory Visit | Attending: Radiation Oncology | Admitting: Radiation Oncology

## 2019-01-13 ENCOUNTER — Other Ambulatory Visit: Payer: Self-pay

## 2019-01-13 DIAGNOSIS — Z510 Encounter for antineoplastic radiation therapy: Secondary | ICD-10-CM | POA: Diagnosis not present

## 2019-01-13 DIAGNOSIS — C01 Malignant neoplasm of base of tongue: Secondary | ICD-10-CM

## 2019-01-13 MED ORDER — SONAFINE EX EMUL
1.0000 "application " | Freq: Once | CUTANEOUS | Status: AC
  Administered 2019-01-13: 1 via TOPICAL

## 2019-01-14 ENCOUNTER — Ambulatory Visit
Admission: RE | Admit: 2019-01-14 | Discharge: 2019-01-14 | Disposition: A | Discharge status: Home / Self Care | Payer: Medicare Other | Source: Ambulatory Visit | Attending: Radiation Oncology | Admitting: Radiation Oncology

## 2019-01-14 ENCOUNTER — Encounter: Payer: Self-pay | Admitting: Radiation Oncology

## 2019-01-14 ENCOUNTER — Other Ambulatory Visit: Payer: Self-pay

## 2019-01-14 DIAGNOSIS — Z510 Encounter for antineoplastic radiation therapy: Secondary | ICD-10-CM | POA: Diagnosis not present

## 2019-01-28 NOTE — Progress Notes (Signed)
  Patient Name: [name] MRN: [id number] DOB: [birth year] Referring Physician: Benjamine Mola SUI (Profile Not Attached) Date of Service: 01/14/2019 [facility], [place]                                                        End Of Treatment Note  Diagnoses: C01-Malignant neoplasm of base of tongue  Cancer Staging Malignant neoplasm of base of tongue ([place]) Staging form: Pharynx - P16 Negative Oropharynx, AJCC 8th Edition - Clinical stage from 09/27/2018: Stage IVA (cT2, cN2b, cM0, p16-) - Signed by Eppie Gibson, MD on 09/27/2018  Intent: Curative  Radiation Treatment Dates: 12/02/2018 through 01/14/2019 Site Technique Total Dose Dose per Fx Completed Fx Beam Energies  Head & neck: HN_BOT IMRT 60/60 2 30/30 6X   Narrative: The patient tolerated radiation therapy relatively well. He experienced mild fatigue and occasional dry mouth with thick saliva; oral cavity is clear on exam. He denied any dysphagia and did not use his PEG tube. He has not lost any weight. The skin over his neck is erythematous but intact. He is applying Sonafine twice daily as directed. Towards the end of treatment, the patient reported occasional pain with deep inspiration in a small area in the superficial right chest. No shortness of breath or calf tenderness.  Plan: The patient will follow-up with radiation oncology in 3 weeks. Chest pain likely musculoskeletal in nature.  ________________________________________________  Eppie Gibson, MD  This document serves as a record of services personally performed by Eppie Gibson, MD. It was created on her behalf by Rae Lips, a trained medical scribe. The creation of this record is based on the scribe's personal observations and the provider's statements to them. This document has been checked and approved by  the attending provider.

## 2019-01-29 ENCOUNTER — Other Ambulatory Visit: Payer: Self-pay | Admitting: Urology

## 2019-01-29 DIAGNOSIS — C61 Malignant neoplasm of prostate: Secondary | ICD-10-CM

## 2019-02-05 ENCOUNTER — Ambulatory Visit
Admission: RE | Admit: 2019-02-05 | Discharge: 2019-02-05 | Disposition: A | Discharge status: Home / Self Care | Payer: Medicare Other | Source: Ambulatory Visit | Attending: Radiation Oncology | Admitting: Radiation Oncology

## 2019-02-05 ENCOUNTER — Encounter: Payer: Self-pay | Admitting: Radiation Oncology

## 2019-02-05 ENCOUNTER — Other Ambulatory Visit: Payer: Self-pay

## 2019-02-05 VITALS — BP 110/87 | HR 74 | Temp 99.4°F | Resp 20 | Ht 67.0 in | Wt 132.8 lb

## 2019-02-05 DIAGNOSIS — Z923 Personal history of irradiation: Secondary | ICD-10-CM | POA: Diagnosis not present

## 2019-02-05 DIAGNOSIS — Z79899 Other long term (current) drug therapy: Secondary | ICD-10-CM | POA: Diagnosis not present

## 2019-02-05 DIAGNOSIS — Z931 Gastrostomy status: Secondary | ICD-10-CM | POA: Insufficient documentation

## 2019-02-05 DIAGNOSIS — C01 Malignant neoplasm of base of tongue: Secondary | ICD-10-CM | POA: Insufficient documentation

## 2019-02-05 DIAGNOSIS — M542 Cervicalgia: Secondary | ICD-10-CM | POA: Insufficient documentation

## 2019-02-05 DIAGNOSIS — R634 Abnormal weight loss: Secondary | ICD-10-CM

## 2019-02-05 HISTORY — DX: Personal history of irradiation: Z92.3

## 2019-02-05 NOTE — Patient Instructions (Signed)
Coronavirus (COVID-19) Are you at risk?  Are you at risk for the Coronavirus (COVID-19)?  To be considered HIGH RISK for Coronavirus (COVID-19), you have to meet the following criteria:  . Traveled to [place], [place], [place], [place] or [place]; or in the [place] to [place], [place], [place], or [place]; and have fever, cough, and shortness of breath within the last 2 weeks of travel OR . Been in close contact with a person diagnosed with COVID-19 within the last 2 weeks and have fever, cough, and shortness of breath . IF YOU DO NOT MEET THESE CRITERIA, YOU ARE CONSIDERED LOW RISK FOR COVID-19.  What to do if you are HIGH RISK for COVID-19?  . If you are having a medical emergency, call 911. . Seek medical care right away. Before you go to a doctor's office, urgent care or emergency department, call ahead and tell them about your recent travel, contact with someone diagnosed with COVID-19, and your symptoms. You should receive instructions from your physician's office regarding next steps of care.  . When you arrive at healthcare provider, tell the healthcare staff immediately you have returned from visiting [place], [place], [place], [place] or [place]; or traveled in the [place] to [place], [place], [place], or [place]; in the last two weeks or you have been in close contact with a person diagnosed with COVID-19 in the last 2 weeks.   . Tell the health care staff about your symptoms: fever, cough and shortness of breath. . After you have been seen by a medical provider, you will be either: o Tested for (COVID-19) and discharged home on quarantine except to seek medical care if symptoms worsen, and asked to  - Stay home and avoid contact with others until you get your results (4-5 days)  - Avoid travel on public transportation if possible (such as bus, train, or airplane) or o Sent to the Emergency Department by EMS for evaluation, COVID-19 testing, and possible  admission depending on your condition and test results.  What to do if you are LOW RISK for COVID-19?  Reduce your risk of any infection by using the same precautions used for avoiding the common cold or flu:  . Wash your hands often with soap and warm water for at least 20 seconds.  If soap and water are not readily available, use an alcohol-based hand sanitizer with at least 60% alcohol.  . If coughing or sneezing, cover your mouth and nose by coughing or sneezing into the elbow areas of your shirt or coat, into a tissue or into your sleeve (not your hands). . Avoid shaking hands with others and consider head nods or verbal greetings only. . Avoid touching your eyes, nose, or mouth with unwashed hands.  . Avoid close contact with people who are sick. . Avoid places or events with large numbers of people in one location, like concerts or sporting events. . Carefully consider travel plans you have or are making. . If you are planning any travel outside or inside the [place], visit the CDC's Travelers' Health webpage for the latest health notices. . If you have some symptoms but not all symptoms, continue to monitor at home and seek medical attention if your symptoms worsen. . If you are having a medical emergency, call 911.   ADDITIONAL HEALTHCARE OPTIONS FOR PATIENTS  [facility] / e-Visit: [email or web address]         [facility]: [phone]  [facility]: [phone]                   [facility]: [phone]   

## 2019-02-05 NOTE — Progress Notes (Signed)
[name] presents for follow up of radiation completed 01/14/19 to his head/neck and base of tongue.   Pain issues, if any: generalized body pain.  Using a feeding tube?: in place, he has never used it.  Weight changes, if any:  01/06/19 127.6 lb 01/13/19 128.6 lb 02/05/19 132.8 lb Swallowing issues, if any: He reports dull pain to the right side of his mouth/throat when he swallows. He is eating a regular diet except chewy and spicy foods. He is drinking 3 boost daily.  Smoking or chewing tobacco? No Using fluoride trays daily? Daily.  Last ENT visit was on: At time of diagnosis.  Other notable issues, if any:  He would like his PEG removed here at [facility]. It was placed at [facility]. The skin to his his radiation site is red. He continues to use sonafine He will switch to a vitamin E lotion when the sonafine is complete.   BP 110/87 (BP Location: Left Arm, Patient Position: Sitting)   Pulse 74   Temp 99.4 F (37.4 C) (Oral)   Resp 20   Ht 5\' 7"  (1.702 m)   Wt 132 lb 12.8 oz (60.2 kg)   SpO2 100%   BMI 20.80 kg/m

## 2019-02-05 NOTE — Progress Notes (Signed)
Radiation Oncology         [phone] ________________________________  Name: [name] MRN: [id number]  Date: 02/05/2019  DOB: June 24, 1946  Follow-Up Visit Note  CC: Denita Lung, MD  Leta Baptist, MD  Diagnosis and Prior Radiotherapy:       ICD-10-CM   1. Loss of weight  R63.4 TSH  2. Malignant neoplasm of base of tongue ([facility])  C01 TSH    BUN & Creatinine ([facility])    CT Chest W Contrast    CT Soft Tissue Neck W Contrast    Ambulatory referral to Physical Therapy    CHIEF COMPLAINT:  Here for follow-up and surveillance of throat cancer  Narrative:  The patient returns today for routine follow-up.  He is doing very well.  His wife was present on speaker phone while the patient saw me in person.  His main complaint is that he does not like having his feeding tube.  He has not been using it for nutrition.    He would like to have it removed here in [place] even though it was placed at [facility].  Otherwise his skin is healing nicely and he is tolerating his food well.  His energy is good overall.  He denies any significant pain.  He does have tightness in the posterior neck that is somewhat bothersome.                    ALLERGIES:  has No Known Allergies.  Meds: Current Outpatient Medications  Medication Sig Dispense Refill  . donepezil (ARICEPT) 10 MG tablet Take 1 tablet (10 mg total) by mouth at bedtime. 90 tablet 2  . lidocaine (XYLOCAINE) 2 % solution Patient: Mix 1part 2% viscous lidocaine, 1part H20. Swish & swallow 24mL of diluted mixture, 5min before meals and at bedtime, up to QID 100 mL 5  . mirtazapine (REMERON SOLTAB) 30 MG disintegrating tablet Take 1 tablet (30 mg total) by mouth at bedtime. 30 tablet 5  . polyethylene glycol powder (GLYCOLAX/MIRALAX) powder Take 17 g by mouth daily. 3350 g 11  . primidone (MYSOLINE) 50 MG tablet Take 1 tablet (50 mg total) by mouth 2 (two) times daily. 180 tablet 2  . rosuvastatin (CRESTOR) 20 MG tablet TAKE 1 TABLET(20 MG)  BY MOUTH DAILY 90 tablet 0  . sodium fluoride (PREVIDENT 5000 PLUS) 1.1 % CREA dental cream Apply cream to tooth brush. Brush teeth for 2 minutes. Spit out excess. DO NOT rinse afterwards. Repeat nightly. 1 Tube prn  . terazosin (HYTRIN) 2 MG capsule TAKE 1 CAPSULE(2 MG) BY MOUTH DAILY 90 capsule 1  . triamcinolone (NASACORT ALLERGY 24HR) 55 MCG/ACT AERO nasal inhaler Place 1 spray into the nose daily.    Marland Kitchen vortioxetine HBr (TRINTELLIX) 20 MG TABS tablet Take 1 tablet (20 mg total) by mouth daily. 90 tablet 1  . buPROPion (WELLBUTRIN XL) 150 MG 24 hr tablet 3   qam 90 tablet 3  . HYDROcodone-acetaminophen (NORCO/VICODIN) 5-325 MG tablet Take 1 tablet by mouth every 6 (six) hours as needed for moderate pain. For up to 5 days    . ondansetron (ZOFRAN-ODT) 4 MG disintegrating tablet Take 4 mg by mouth every 8 (eight) hours as needed for nausea.     No current facility-administered medications for this encounter.     Physical Findings: The patient is in no acute distress. Patient is alert and oriented. Wt Readings from Last 3 Encounters:  02/05/19 132 lb 12.8 oz (60.2 kg)  12/19/18 129 lb (58.5 kg)  11/20/18 128 lb 12.8 oz (58.4 kg)    height is 5\' 7"  (1.702 m) and weight is 132 lb 12.8 oz (60.2 kg). His oral temperature is 99.4 F (37.4 C). His blood pressure is 110/87 and his pulse is 74. His respiration is 20 and oxygen saturation is 100%. .  General: Alert and oriented, in no acute distress HEENT: Head is normocephalic. Extraocular movements are intact. Oropharynx is notable for clear mucosa, no tumor  Neck: Neck is notable for no palpable masses Skin: Skin in treatment fields shows satisfactory healing  Lymphatics: see Neck Exam Psychiatric: Affect is appropriate.   Lab Findings: Lab Results  Component Value Date   WBC 3.5 (L) 09/19/2018   HGB 13.0 09/19/2018   HCT 38.8 (L) 09/19/2018   MCV 93.5 09/19/2018   PLT 199 09/19/2018    Lab Results  Component Value Date   TSH 2.057  09/19/2018    Radiographic Findings: No results found.  Impression/Plan:    1) Head and Neck Cancer Status: Healing well from radiotherapy  2) Nutritional Status: Good intake tolerating well PEG tube: [name], RN, our Head and Neck Oncology Navigator has been asked to explore if this can be removed in [place].  He will navigate accordingly  3) Risk Factors: The patient has been educated about risk factors including alcohol and tobacco abuse; they understand that avoidance of alcohol and tobacco is important to prevent recurrences as well as other cancers  4) Swallowing: Good function, continue swallowing exercises per [facility] speech-language pathology  5) Dental: Encouraged to continue regular followup with dentistry, and dental hygiene including fluoride rinses.   6) Thyroid function: Will recheck at next appointment Lab Results  Component Value Date   TSH 2.057 09/19/2018    7) Other: Will refer to physical therapy for rehabilitation and neck tightness  8) Follow-up in 2 months with CT of chest and neck. The patient was encouraged to call with any issues or questions before then.   _____________________________________   Eppie Gibson, MD

## 2019-02-11 ENCOUNTER — Ambulatory Visit: Payer: Medicare Other | Attending: Radiation Oncology | Admitting: Physical Therapy

## 2019-02-11 ENCOUNTER — Other Ambulatory Visit: Payer: Self-pay

## 2019-02-11 ENCOUNTER — Encounter: Payer: Self-pay | Admitting: Physical Therapy

## 2019-02-11 DIAGNOSIS — L599 Disorder of the skin and subcutaneous tissue related to radiation, unspecified: Secondary | ICD-10-CM

## 2019-02-11 DIAGNOSIS — M542 Cervicalgia: Secondary | ICD-10-CM | POA: Insufficient documentation

## 2019-02-11 DIAGNOSIS — R293 Abnormal posture: Secondary | ICD-10-CM | POA: Diagnosis present

## 2019-02-11 NOTE — Therapy (Signed)
[place], [place], [address] Phone: [phone]   Fax:  [phone]  Physical Therapy Evaluation  Patient Details  Name: [name] MRN: [id number] Date of Birth: [birth year] Referring Provider (PT): Reita May Date: 02/11/2019  PT End of Session - 02/11/19 1446    Visit Number  1    Number of Visits  9    Date for PT Re-Evaluation  03/11/19    PT Start Time  1400    PT Stop Time  1445    PT Time Calculation (min)  45 min    Activity Tolerance  Patient tolerated treatment well    Behavior During Therapy  [facility] for tasks assessed/performed       Past Medical History:  Diagnosis Date  . Allergy   . Alzheimer disease ([place]) 06/2018   diagnosed with early stage alzheimers  . Arthritis    osteoarthritis  . Cancer [facility])    testicular, age 22, orchiectomy 24  . Cataract    B/L CATARACTS NO SURGERY  . Dementia ([place])   . Depression   . Dyslipidemia   . Heartburn    TAKES ZANTAC  . History of radiation therapy 12/02/18- 01/14/19   Head and neck/ base of tongue. 60 Gy over 30 fractions.   . Hypertension   . Hypogonadism male   . Prostate cancer ([place]) 06/07/11   gleason 3+3=6, vol 59.5 cc  . Sleep apnea   . Status post chemotherapy    testicular cancer 1992, Dr Beryle Beams    Past Surgical History:  Procedure Laterality Date  . APPENDECTOMY    . COLONOSCOPY  2007   gessner  . DIRECT LARYNGOSCOPY N/A 08/19/2018   Procedure: DIRECT LARYNGOSCOPY;  Surgeon: Leta Baptist, MD;  Location: [place];  Service: ENT;  Laterality: N/A;  . INGUINAL HERNIA REPAIR     w/orchiectomy 1992, retroperitoneal lymph node dissection  . MENISECTOMY     R&L knee surgeries  . PROSTATE SURGERY     biopsy x 2  . TONGUE BIOPSY N/A 08/19/2018   Procedure: BIOPSY OF TONGUE BASE MASS;  Surgeon: Leta Baptist, MD;  Location: [place];  Service: ENT;  Laterality: N/A;    There were no  vitals filed for this visit.   Subjective Assessment - 02/11/19 1405    Subjective  I sometimes have pain and tightness in my neck. It is most appararent when I am eating and when I am watching TV in bed.    Patient is accompained by:  Family member    Pertinent History  mid March 2020-TORS modified radical neck dissection for treatment of base of tongue and R tonsillar cancer, completed radiation in early June 2020, alzheimers disease, hx of testicular cancer, current prostate cancer    Patient Stated Goals  to not feel any discomfort in neck when eating    Currently in Pain?  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   No    Is the patient reluctant to leave their home because of a fear of falling?   No      Home Film/video editor residence    Living Arrangements  Spouse/significant other    Available Help at Discharge  Family    Type of [place]      Prior Function   Level of [place] with basic ADLs   needs assist with cleaning due to [place]  Retired    Leisure  pt does not exercise      Cognition   Overall Cognitive Status  History of cognitive impairments - at baseline   pt has early onset Alzheimers     ROM / Strength   AROM / PROM / Strength  AROM      AROM   Overall AROM   Within functional limits for tasks performed   in bilateral  shoulders   AROM Assessment Site  Cervical    Cervical Flexion  WFL    Cervical Extension  50% limited     Cervical - Right Side Bend  WFL    Cervical - Left Side Bend  25% limited    Cervical - Right Rotation  WFL    Cervical - Left Rotation  10% limited        LYMPHEDEMA/ONCOLOGY QUESTIONNAIRE - 02/11/19 1427      Type   Cancer Type  base of tongue and SCC of R tonsil      Surgeries   Other Surgery Date  10/21/18      Treatment   Active Radiation Treatment  No    Past Radiation Treatment  Yes      What other symptoms do you have   Are you Having Heaviness or Tightness  Yes    Are you having Pain  Yes    Are you having pitting edema  No      Lymphedema Assessments   Lymphedema Assessments  Head and Neck      Head and Neck   4 cm superior to sternal notch around neck  37 cm    6 cm superior to sternal notch around neck  36.2 cm    8 cm superior to sternal notch around neck  36.5 cm             Objective measurements completed on examination: See above findings.              PT Education - 02/11/19 1446    Education Details  head and neck ROM exercises, signs and symptoms of lymphedema, walking program    Person(s) Educated  Patient;Spouse    Methods  Explanation;Handout;Demonstration    Comprehension  Verbalized understanding;Returned demonstration;Verbal cues required          PT Long Term Goals - 02/11/19 1452      PT LONG TERM GOAL #1   Title  Pt will demonstrate improved neck extension to allow decreased discomfort    Baseline  50% limited    Time  4    Period  Weeks    Status  New  Target Date  03/11/19      PT LONG TERM GOAL #2   Title  Pt will report a 75% improvement in neck tightness and pain to allow improved comfort when eating    Time  4    Period  Weeks    Status  New    Target Date  03/11/19      PT LONG TERM GOAL #3   Title  Pt will be independent in a home exercise program for continued strengthening and  stretching    Time  4    Period  Weeks    Status  New    Target Date  03/11/19             Plan - 02/11/19 1447    Clinical Impression Statement  Pt presents to PT with neck tightness following TORS neck dissection and radiation for treatment of base of tongue cancer and squamous cell carinoma of R tonsil. He reports he is most affected by the tightness while eating and when watching TV in bed at night. He demonstrates a forward head posture. He has the most tightness during cervical extension and bilateral SCM are very tight. Pt would benefit from skilled PT services to improve neck ROM, and decrease tightness and discomfort.    Personal Factors and Comorbidities  Comorbidity 1;Comorbidity 2;Comorbidity 3+    Comorbidities  Alzheimer's, current prostate cancer, hx of testicular cancer    Examination-Activity Limitations  Other   discomfort when eating due to neck tightness   Stability/Clinical Decision Making  Evolving/Moderate complexity    Clinical Decision Making  Moderate    Rehab Potential  Good    PT Frequency  2x / week    PT Duration  4 weeks    PT Treatment/Interventions  ADLs/Self Care Home Management;Therapeutic activities;Therapeutic exercise;Patient/family education;Manual techniques;Passive range of motion;Scar mobilization;Taping    PT Next Visit Plan  begin PROM to neck in all directions, massage to upper traps, SCM stretch    PT Home Exercise Plan  head and neck ROM    Consulted and Agree with Plan of Care  Patient;Family member/caregiver       Patient will benefit from skilled therapeutic intervention in order to improve the following deficits and impairments:  Decreased range of motion, Decreased scar mobility, Decreased strength, Increased fascial restricitons, Postural dysfunction, Pain  Visit Diagnosis: 1. Disorder of the skin and subcutaneous tissue related to radiation, unspecified   2. Cervicalgia   3. Abnormal posture        Problem List Patient  Active Problem List   Diagnosis Date Noted  . Malignant neoplasm of base of tongue ([place]) 09/27/2018  . 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 Aortic atherosclerosis ([place]) 08/08/2018  . History of testicular cancer 08/08/2018  . Lymphadenitis 07/26/2018  . Mild cognitive impairment 09/11/2016  . Essential tremor 09/11/2016  . Hyperreflexia 06/08/2016  . Family history of heart disease in male family member before age 36 08/31/2015  . Depression 10/19/2014  . OSA (obstructive sleep apnea) 08/24/2014  . Hypertension   . Heartburn   . Arthritis   . History of prostate cancer 06/07/2011  . Hyperlipidemia 12/28/2010  . BPH (benign prostatic hypertrophy) with urinary retention 12/28/2010  . Hypogonadism male 12/28/2010    Allyson Sabal [facility] 02/11/2019, 2:54 PM  [place], [place], [address] Phone: [phone]   Fax:  [phone]  Name: [name] MRN: [id number] Date of  Birth: [birth year]  Manus Gunning, PT 02/11/19 2:54 PM

## 2019-02-12 ENCOUNTER — Ambulatory Visit (HOSPITAL_COMMUNITY): Payer: Medicaid - Dental | Admitting: Dentistry

## 2019-02-12 ENCOUNTER — Encounter: Payer: Self-pay | Admitting: Physical Therapy

## 2019-02-12 ENCOUNTER — Encounter (HOSPITAL_COMMUNITY): Payer: Self-pay | Admitting: Dentistry

## 2019-02-12 ENCOUNTER — Ambulatory Visit: Payer: Medicare Other | Admitting: Physical Therapy

## 2019-02-12 ENCOUNTER — Other Ambulatory Visit: Payer: Self-pay

## 2019-02-12 VITALS — BP 112/72 | HR 73 | Temp 98.6°F | Wt 130.0 lb

## 2019-02-12 DIAGNOSIS — L599 Disorder of the skin and subcutaneous tissue related to radiation, unspecified: Secondary | ICD-10-CM

## 2019-02-12 DIAGNOSIS — M542 Cervicalgia: Secondary | ICD-10-CM

## 2019-02-12 DIAGNOSIS — R432 Parageusia: Secondary | ICD-10-CM

## 2019-02-12 DIAGNOSIS — Z923 Personal history of irradiation: Secondary | ICD-10-CM

## 2019-02-12 DIAGNOSIS — K117 Disturbances of salivary secretion: Secondary | ICD-10-CM

## 2019-02-12 DIAGNOSIS — R293 Abnormal posture: Secondary | ICD-10-CM

## 2019-02-12 DIAGNOSIS — K031 Abrasion of teeth: Secondary | ICD-10-CM

## 2019-02-12 DIAGNOSIS — C01 Malignant neoplasm of base of tongue: Secondary | ICD-10-CM

## 2019-02-12 NOTE — Patient Instructions (Signed)
"  Jakhi Dalton the sky exercise"

## 2019-02-12 NOTE — Therapy (Signed)
[place], [place], [address] Phone: [phone]   Fax:  [phone]  Physical Therapy Treatment  Patient Details  Name: [name] MRN: [id number] Date of Birth: [birth year] Referring Provider (PT): Reita May Date: 02/12/2019  PT End of Session - 02/12/19 1424    Visit Number  2    Number of Visits  9    Date for PT Re-Evaluation  03/11/19    PT Start Time  1332    PT Stop Time  1417    PT Time Calculation (min)  45 min    Activity Tolerance  Patient tolerated treatment well    Behavior During Therapy  [facility] for tasks assessed/performed       Past Medical History:  Diagnosis Date  . Allergy   . Alzheimer disease ([place]) 06/2018   diagnosed with early stage alzheimers  . Arthritis    osteoarthritis  . Cancer [facility])    testicular, age 26, orchiectomy 43  . Cataract    B/L CATARACTS NO SURGERY  . Dementia ([place])   . Depression   . Dyslipidemia   . Heartburn    TAKES ZANTAC  . History of radiation therapy 12/02/18- 01/14/19   Head and neck/ base of tongue. 60 Gy over 30 fractions.   . Hypertension   . Hypogonadism male   . Prostate cancer ([place]) 06/07/11   gleason 3+3=6, vol 59.5 cc  . Sleep apnea   . Status post chemotherapy    testicular cancer 1992, Dr Beryle Beams    Past Surgical History:  Procedure Laterality Date  . APPENDECTOMY    . COLONOSCOPY  2007   gessner  . DIRECT LARYNGOSCOPY N/A 08/19/2018   Procedure: DIRECT LARYNGOSCOPY;  Surgeon: Leta Baptist, MD;  Location: [place];  Service: ENT;  Laterality: N/A;  . INGUINAL HERNIA REPAIR     w/orchiectomy 1992, retroperitoneal lymph node dissection  . MENISECTOMY     R&L knee surgeries  . PROSTATE SURGERY     biopsy x 2  . TONGUE BIOPSY N/A 08/19/2018   Procedure: BIOPSY OF TONGUE BASE MASS;  Surgeon: Leta Baptist, MD;  Location: [place];  Service: ENT;  Laterality: N/A;    There were no  vitals filed for this visit.  Subjective Assessment - 02/12/19 1331    Subjective  My neck is feeling pretty good today but when I am chewing I get the slight swelling ache on one side or another. I have not had a chance to do the exercises yet.    Pertinent History  mid March 2020-TORS modified radical neck dissection for treatment of base of tongue and R tonsillar cancer, completed radiation in early June 2020, alzheimers disease, hx of testicular cancer, current prostate cancer    Patient Stated Goals  to not feel any discomfort in neck when eating    Currently in Pain?  No/denies    Pain Score  0-No pain                       [facility] Adult PT Treatment/Exercise - 02/12/19 0001      Manual Therapy   Manual Therapy  Passive ROM;Soft tissue mobilization    Soft tissue mobilization  along bilateral SCM, lateral neck in area of scar and posterior neck to decrease muscle tension and tightness    Passive ROM  in supine with prolonged stretches into extension while applying pressure to origin  of SCM, lateral flexion bilaterally while applying pressure to origin of SCM, rotation while applying pressure to origin of SCM opposite the side the rotation      Neck Exercises: Stretches   Other Neck Stretches  instructed pt and had pt return demonstrate SCM stretch while applying gentle pressure over origin of SCM while rotating head opposite direction and gently tilting head backwards- issued handout    Other Neck Stretches  instructed pt in kiss the sky exercise to stretch anterior neck             PT Education - 02/12/19 1423    Education Details  kiss the sky exercise and importance of compliance with exercises, purpose of PROM, effects of radiation    Person(s) Educated  Patient;Spouse    Methods  Explanation    Comprehension  Verbalized understanding          PT Long Term Goals - 02/11/19 1452      PT LONG TERM GOAL #1   Title  Pt will demonstrate improved neck  extension to allow decreased discomfort    Baseline  50% limited    Time  4    Period  Weeks    Status  New    Target Date  03/11/19      PT LONG TERM GOAL #2   Title  Pt will report a 75% improvement in neck tightness and pain to allow improved comfort when eating    Time  4    Period  Weeks    Status  New    Target Date  03/11/19      PT LONG TERM GOAL #3   Title  Pt will be independent in a home exercise program for continued strengthening and stretching    Time  4    Period  Weeks    Status  New    Target Date  03/11/19            Plan - 02/12/19 1424    Clinical Impression Statement  Began PROM to neck in all directions today with prolonged holds while giving resistance to origin of SCM to help decrease tightness of SCM. Also performed soft tissue mobilization along SCM and to lateral and posterior cervical muscles. Issued handout for pt to practice SCM stretch at home as well as "kiss thy sky" exercise.    Stability/Clinical Decision Making  Evolving/Moderate complexity    Rehab Potential  Good    PT Frequency  2x / week    PT Treatment/Interventions  ADLs/Self Care Home Management;Therapeutic activities;Therapeutic exercise;Patient/family education;Manual techniques;Passive range of motion;Scar mobilization;Taping    PT Next Visit Plan  continue PROM to neck in all directions, massage to upper traps, SCM stretch, ask about exercises    PT Home Exercise Plan  head and neck ROM    Consulted and Agree with Plan of Care  Patient;Family member/caregiver       Patient will benefit from skilled therapeutic intervention in order to improve the following deficits and impairments:  Decreased range of motion, Decreased scar mobility, Decreased strength, Increased fascial restricitons, Postural dysfunction, Pain  Visit Diagnosis: 1. Cervicalgia   2. Disorder of the skin and subcutaneous tissue related to radiation, unspecified   3. Abnormal posture        Problem  List Patient Active Problem List   Diagnosis Date Noted  . Malignant neoplasm of base of tongue ([place]) 09/27/2018  . 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 Aortic  atherosclerosis ([place]) 08/08/2018  . History of testicular cancer 08/08/2018  . Lymphadenitis 07/26/2018  . Mild cognitive impairment 09/11/2016  . Essential tremor 09/11/2016  . Hyperreflexia 06/08/2016  . Family history of heart disease in male family member before age 11 08/31/2015  . Depression 10/19/2014  . OSA (obstructive sleep apnea) 08/24/2014  . Hypertension   . Heartburn   . Arthritis   . History of prostate cancer 06/07/2011  . Hyperlipidemia 12/28/2010  . BPH (benign prostatic hypertrophy) with urinary retention 12/28/2010  . Hypogonadism male 12/28/2010    Allyson Sabal [facility] 02/12/2019, 2:28 PM  [place], [place], [address] Phone: [phone]   Fax:  [phone]  Name: [name] MRN: [id number] Date of Birth: 1946-02-29  Manus Gunning, PT 02/12/19 2:28 PM

## 2019-02-12 NOTE — Progress Notes (Signed)
02/12/2019  Patient Name:   [name] Date of Birth:   1946-03-02 Medical Record Number: [id number]  COVID 19 SCREENING: The patient does not symptoms concerning for COVID-19 infection (Including fever, chills, cough, or new SHORTNESS OF BREATH).    BP 112/72 (BP Location: Left Arm)   Pulse 73   Temp 98.6 F (37 C)   Wt 130 lb (59 kg)   BMI 20.36 kg/m   Eben Burow presents for oral examination after radiation therapy. Patient received radiation therapy from 04/27/202 thru 01/14/2019. No chemotherapy. Patient had a total dose of 6000 cGy.  REVIEW OF CHIEF COMPLAINTS:  DRY MOUTH: The patient has incipient xerostomia. HARD TO SWALLOW: no  HURT TO SWALLOW: no TASTE CHANGES: Taste is returning slowly SORES IN MOUTH: patient denies TRISMUS: no trismus symptoms WEIGHT: 130 pounds down from initial 150 pounds. Stable now.  HOME OH REGIMEN:  BRUSHING: twice a day FLOSSING: once a day RINSING: using Non-alcohol Listerine rinses.  FLUORIDE: Uses at bedtime in his trays TRISMUS EXERCISES:  Maximum interincisal opening: 75mm    DENTAL EXAM:  Oral Hygiene:(PLAQUE): good oral hygiene LOCATION OF MUCOSITIS: none noted DESCRIPTION OF SALIVA: decreased saliva. Incipient xerostomia ANY EXPOSED BONE: none noted OTHER WATCHED AREAS: previous extraction sites of #2 and #31; multiple flexure lesions DX: Xerostomia, Dysgeusia and multiple flexure lesions.  RECOMMENDATIONS: 1. Brush after meals and at bedtime.  Use fluoride at bedtime. 2. Use trismus exercises as directed. 3. Use Biotene Rinse or salt water/baking soda rinses. If he uses Listerine, recommend the nonalcohol product 4. Multiple sips of water as needed. 5. Return to his primary dentist or periodontist as scheduled for periodontal maintenance procedures.   Lenn Cal, DDS

## 2019-02-12 NOTE — Patient Instructions (Addendum)
RECOMMENDATIONS: 1. Brush after meals and at bedtime.  Use fluoride at bedtime. 2. Use trismus exercises as directed. 3. Use Biotene Rinse or salt water/baking soda rinses. If he uses Listerine, recommend the nonalcohol product 4. Multiple sips of water as needed. 5. Return to his primary dentist or periodontist as scheduled for periodontal maintenance procedures.   Lenn Cal, DDS  COVID-19 Education: The signs and symptoms of COVID-19 were discussed with the patient and how to seek care for testing (follow up with PCP or arrange E-visit).   The importance of social distancing was discussed today.

## 2019-02-18 ENCOUNTER — Encounter: Payer: Self-pay | Admitting: Physical Therapy

## 2019-02-18 ENCOUNTER — Other Ambulatory Visit: Payer: Self-pay

## 2019-02-18 ENCOUNTER — Ambulatory Visit: Payer: Medicare Other | Admitting: Physical Therapy

## 2019-02-18 DIAGNOSIS — L599 Disorder of the skin and subcutaneous tissue related to radiation, unspecified: Secondary | ICD-10-CM | POA: Diagnosis not present

## 2019-02-18 DIAGNOSIS — M542 Cervicalgia: Secondary | ICD-10-CM

## 2019-02-18 DIAGNOSIS — R293 Abnormal posture: Secondary | ICD-10-CM

## 2019-02-18 NOTE — Therapy (Signed)
[place], [place], [address] Phone: [phone]   Fax:  [phone]  Physical Therapy Treatment  Patient Details  Name: [name] MRN: [id number] Date of Birth: 1945/12/04 Referring Provider (PT): Reita May Date: 02/18/2019  PT End of Session - 02/18/19 1350    Visit Number  3    Number of Visits  9    Date for PT Re-Evaluation  03/11/19    PT Start Time  6962    PT Stop Time  1345    PT Time Calculation (min)  42 min    Activity Tolerance  Patient tolerated treatment well    Behavior During Therapy  [facility] for tasks assessed/performed       Past Medical History:  Diagnosis Date  . Allergy   . Alzheimer disease ([place]) 06/2018   diagnosed with early stage alzheimers  . Arthritis    osteoarthritis  . Cancer [facility])    testicular, age 72, orchiectomy 11  . Cataract    B/L CATARACTS NO SURGERY  . Dementia ([place])   . Depression   . Dyslipidemia   . Heartburn    TAKES ZANTAC  . History of radiation therapy 12/02/18- 01/14/19   Head and neck/ base of tongue. 60 Gy over 30 fractions.   . Hypertension   . Hypogonadism male   . Prostate cancer ([place]) 06/07/11   gleason 3+3=6, vol 59.5 cc  . Sleep apnea   . Status post chemotherapy    testicular cancer 1992, Dr Beryle Beams    Past Surgical History:  Procedure Laterality Date  . APPENDECTOMY    . COLONOSCOPY  2007   gessner  . DIRECT LARYNGOSCOPY N/A 08/19/2018   Procedure: DIRECT LARYNGOSCOPY;  Surgeon: Leta Baptist, MD;  Location: [place];  Service: ENT;  Laterality: N/A;  . INGUINAL HERNIA REPAIR     w/orchiectomy 1992, retroperitoneal lymph node dissection  . MENISECTOMY     R&L knee surgeries  . PROSTATE SURGERY     biopsy x 2  . TONGUE BIOPSY N/A 08/19/2018   Procedure: BIOPSY OF TONGUE BASE MASS;  Surgeon: Leta Baptist, MD;  Location: [place];  Service: ENT;  Laterality: N/A;    There were no  vitals filed for this visit.  Subjective Assessment - 02/18/19 1304    Subjective  I have not been feeling any pain in my neck. Pt reports he has not been compliant with his exercises.    Pertinent History  mid March 2020-TORS modified radical neck dissection for treatment of base of tongue and R tonsillar cancer, completed radiation in early June 2020, alzheimers disease, hx of testicular cancer, current prostate cancer    Patient Stated Goals  to not feel any discomfort in neck when eating    Currently in Pain?  No/denies    Pain Score  0-No pain                       [facility] Adult PT Treatment/Exercise - 02/18/19 0001      Manual Therapy   Soft tissue mobilization  along bilateral SCM, lateral neck in area of scar and posterior neck to decrease muscle tension and tightness as well as bilateral upper traps    Passive ROM  in supine with prolonged stretches into extension while applying pressure to origin of SCM, lateral flexion bilaterally while applying pressure to origin of SCM, rotation while applying pressure to origin  of SCM opposite the side the rotation                  PT Long Term Goals - 02/11/19 1452      PT LONG TERM GOAL #1   Title  Pt will demonstrate improved neck extension to allow decreased discomfort    Baseline  50% limited    Time  4    Period  Weeks    Status  New    Target Date  03/11/19      PT LONG TERM GOAL #2   Title  Pt will report a 75% improvement in neck tightness and pain to allow improved comfort when eating    Time  4    Period  Weeks    Status  New    Target Date  03/11/19      PT LONG TERM GOAL #3   Title  Pt will be independent in a home exercise program for continued strengthening and stretching    Time  4    Period  Weeks    Status  New    Target Date  03/11/19            Plan - 02/18/19 1350    Clinical Impression Statement  Educated pt on importance of compliance with home exercise program to help  maintain gains in therapy. Pt has not had neck pain since last session. Contnued to work on [employer] and lateral and osterior cervical muscles today through manual therapy.    Stability/Clinical Decision Making  Evolving/Moderate complexity    Rehab Potential  Good    PT Frequency  2x / week    PT Duration  4 weeks    PT Next Visit Plan  continue PROM to neck in all directions, massage to upper traps, SCM stretch, ask about exercises    PT Home Exercise Plan  head and neck ROM    Consulted and Agree with Plan of Care  Patient;Family member/caregiver       Patient will benefit from skilled therapeutic intervention in order to improve the following deficits and impairments:  Decreased range of motion, Decreased scar mobility, Decreased strength, Increased fascial restricitons, Postural dysfunction, Pain  Visit Diagnosis: 1. Cervicalgia   2. Disorder of the skin and subcutaneous tissue related to radiation, unspecified   3. Abnormal posture        Problem List Patient Active Problem List   Diagnosis Date Noted  . Malignant neoplasm of base of tongue ([place]) 09/27/2018  . 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 Aortic atherosclerosis ([place]) 08/08/2018  . History of testicular cancer 08/08/2018  . Lymphadenitis 07/26/2018  . Mild cognitive impairment 09/11/2016  . Essential tremor 09/11/2016  . Hyperreflexia 06/08/2016  . Family history of heart disease in male family member before age 12 08/31/2015  . Depression 10/19/2014  . OSA (obstructive sleep apnea) 08/24/2014  . Hypertension   . Heartburn   . Arthritis   . History of prostate cancer 06/07/2011  . Hyperlipidemia 12/28/2010  . BPH (benign prostatic hypertrophy) with urinary retention 12/28/2010  . Hypogonadism male 12/28/2010    Allyson Sabal [facility] 02/18/2019, 1:54 PM  [place], [place], [address] Phone: [phone]   Fax:   [phone]  Name: [name] MRN: [id number] Date of Birth: [birth year]  Manus Gunning, PT 02/18/19 1:55 PM

## 2019-02-21 ENCOUNTER — Ambulatory Visit: Payer: Medicare Other | Admitting: Physical Therapy

## 2019-02-21 ENCOUNTER — Other Ambulatory Visit: Payer: Self-pay

## 2019-02-21 ENCOUNTER — Other Ambulatory Visit: Payer: Self-pay | Admitting: *Deleted

## 2019-02-21 ENCOUNTER — Telehealth: Payer: Self-pay | Admitting: *Deleted

## 2019-02-21 ENCOUNTER — Encounter: Payer: Self-pay | Admitting: Physical Therapy

## 2019-02-21 DIAGNOSIS — R293 Abnormal posture: Secondary | ICD-10-CM

## 2019-02-21 DIAGNOSIS — L599 Disorder of the skin and subcutaneous tissue related to radiation, unspecified: Secondary | ICD-10-CM

## 2019-02-21 DIAGNOSIS — C01 Malignant neoplasm of base of tongue: Secondary | ICD-10-CM

## 2019-02-21 DIAGNOSIS — M542 Cervicalgia: Secondary | ICD-10-CM

## 2019-02-21 NOTE — Therapy (Signed)
[place], [place], [address] Phone: [phone]   Fax:  [phone]  Physical Therapy Treatment  Patient Details  Name: [name] MRN: [id number] Date of Birth: Feb 21, 1946 Referring Provider (PT): Reita May Date: 02/21/2019  PT End of Session - 02/21/19 1124    Visit Number  4    Number of Visits  9    Date for PT Re-Evaluation  03/11/19    PT Start Time  1030    PT Stop Time  1115    PT Time Calculation (min)  45 min    Activity Tolerance  Patient tolerated treatment well    Behavior During Therapy  [facility] for tasks assessed/performed       Past Medical History:  Diagnosis Date  . Allergy   . Alzheimer disease ([place]) 06/2018   diagnosed with early stage alzheimers  . Arthritis    osteoarthritis  . Cancer [facility])    testicular, age 35, orchiectomy 47  . Cataract    B/L CATARACTS NO SURGERY  . Dementia ([place])   . Depression   . Dyslipidemia   . Heartburn    TAKES ZANTAC  . History of radiation therapy 12/02/18- 01/14/19   Head and neck/ base of tongue. 60 Gy over 30 fractions.   . Hypertension   . Hypogonadism male   . Prostate cancer ([place]) 06/07/11   gleason 3+3=6, vol 59.5 cc  . Sleep apnea   . Status post chemotherapy    testicular cancer 1992, Dr Beryle Beams    Past Surgical History:  Procedure Laterality Date  . APPENDECTOMY    . COLONOSCOPY  2007   gessner  . DIRECT LARYNGOSCOPY N/A 08/19/2018   Procedure: DIRECT LARYNGOSCOPY;  Surgeon: Leta Baptist, MD;  Location: [place];  Service: ENT;  Laterality: N/A;  . INGUINAL HERNIA REPAIR     w/orchiectomy 1992, retroperitoneal lymph node dissection  . MENISECTOMY     R&L knee surgeries  . PROSTATE SURGERY     biopsy x 2  . TONGUE BIOPSY N/A 08/19/2018   Procedure: BIOPSY OF TONGUE BASE MASS;  Surgeon: Leta Baptist, MD;  Location: [place];  Service: ENT;  Laterality: N/A;    There were no  vitals filed for this visit.  Subjective Assessment - 02/21/19 1033    Subjective  I am able to pinpoint that spot that is painful when I am eating. It is up under my jaw.    Pertinent History  mid March 2020-TORS modified radical neck dissection for treatment of base of tongue and R tonsillar cancer, completed radiation in early June 2020, alzheimers disease, hx of testicular cancer, current prostate cancer    Patient Stated Goals  to not feel any discomfort in neck when eating    Currently in Pain?  No/denies    Pain Score  0-No pain                       [facility] Adult PT Treatment/Exercise - 02/21/19 0001      Manual Therapy   Soft tissue mobilization  along bilateral SCM, lateral neck in area of scar and posterior neck to decrease muscle tension and tightness as well as bilateral upper traps, spent extra time on anterior right neck just under chin where pt has pain when eating, performed myofascial release to this area using cross hand technique and pt felt a good stretch with this  Passive ROM  in supine with prolonged stretches into extension while applying pressure to origin of SCM, lateral flexion bilaterally while applying pressure to origin of SCM, rotation while applying pressure to origin of SCM opposite the side the rotation                  PT Long Term Goals - 02/11/19 1452      PT LONG TERM GOAL #1   Title  Pt will demonstrate improved neck extension to allow decreased discomfort    Baseline  50% limited    Time  4    Period  Weeks    Status  New    Target Date  03/11/19      PT LONG TERM GOAL #2   Title  Pt will report a 75% improvement in neck tightness and pain to allow improved comfort when eating    Time  4    Period  Weeks    Status  New    Target Date  03/11/19      PT LONG TERM GOAL #3   Title  Pt will be independent in a home exercise program for continued strengthening and stretching    Time  4    Period  Weeks    Status  New     Target Date  03/11/19            Plan - 02/21/19 1124    Clinical Impression Statement  Continued with STM and myofascial release to neck especially anteriorly on right side in area of increased tension and discomfort. Educated pt about effects of radiation including radiation fibrosis and the importance of daily stretching to help decrease tightness. Pt has trigger points in bilateral upper traps with right worse than left and increased tightness along muscles on right anterior neck. Performed cross hands technique to this area to help alleviate discomfort when pt is eating.    Stability/Clinical Decision Making  Evolving/Moderate complexity    Rehab Potential  Good    PT Frequency  2x / week    PT Duration  4 weeks    PT Treatment/Interventions  ADLs/Self Care Home Management;Therapeutic activities;Therapeutic exercise;Patient/family education;Manual techniques;Passive range of motion;Scar mobilization;Taping    PT Next Visit Plan  continue PROM to neck in all directions, massage to upper traps, SCM stretch, ask about exercises    PT Home Exercise Plan  head and neck ROM    Consulted and Agree with Plan of Care  Patient;Family member/caregiver       Patient will benefit from skilled therapeutic intervention in order to improve the following deficits and impairments:  Decreased range of motion, Decreased scar mobility, Decreased strength, Increased fascial restricitons, Postural dysfunction, Pain  Visit Diagnosis: 1. Cervicalgia   2. Disorder of the skin and subcutaneous tissue related to radiation, unspecified   3. Abnormal posture        Problem List Patient Active Problem List   Diagnosis Date Noted  . 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 Aortic atherosclerosis ([place]) 08/08/2018  . History of testicular cancer 08/08/2018  . Lymphadenitis 07/26/2018  . Mild cognitive impairment 09/11/2016   . Essential tremor 09/11/2016  . Hyperreflexia 06/08/2016  . Family history of heart disease in male family member before age 22 08/31/2015  . Depression 10/19/2014  . OSA (obstructive sleep apnea) 08/24/2014  . Hypertension   . Heartburn   . Arthritis   .  History of prostate cancer 06/07/2011  . Hyperlipidemia 12/28/2010  . BPH (benign prostatic hypertrophy) with urinary retention 12/28/2010  . Hypogonadism male 12/28/2010    Allyson Sabal [facility] 02/21/2019, 11:27 AM  [place], [place], [address] Phone: [phone]   Fax:  [phone]  Name: [name] MRN: [id number] Date of Birth: 07-20-1946  Manus Gunning, PT 02/21/19 11:27 AM

## 2019-02-21 NOTE — Telephone Encounter (Signed)
Oncology Nurse Navigator Documentation  In follow-up to call to patient yesterday re coordination of removal of his PEG placed at [facility] (11/29/18), spoke with his wife, indicated approval rec'd from Dr. Nicolette Bang, [facility], for tube removal.  Explained order has been placed, she should hear from [facility] IR early next week to schedule appt.   She voiced understanding, expressed appreciation.  [name], RN, BSN Head & Neck Oncology Nurse [place] at [place] [phone]

## 2019-02-24 NOTE — Progress Notes (Signed)
Oncology Nurse Navigator Documentation  In support of COVID-19 mitigation practices, Mr. Sane participated in this morning's H&N [place] via telephone and received scheduled call from Nutrition. He Is being seen by SLP at [facility], will be referred to [facility] PT for post-RT lymphedema evaluation/treatment as needed.  [name], RN, BSN Head & Neck Oncology Nurse [place] at [place] [phone]

## 2019-02-25 ENCOUNTER — Ambulatory Visit: Payer: Medicare Other

## 2019-02-25 ENCOUNTER — Other Ambulatory Visit: Payer: Self-pay

## 2019-02-25 ENCOUNTER — Ambulatory Visit (INDEPENDENT_AMBULATORY_CARE_PROVIDER_SITE_OTHER): Payer: Medicare Other | Admitting: Psychiatry

## 2019-02-25 DIAGNOSIS — F325 Major depressive disorder, single episode, in full remission: Secondary | ICD-10-CM

## 2019-02-25 DIAGNOSIS — R293 Abnormal posture: Secondary | ICD-10-CM

## 2019-02-25 DIAGNOSIS — L599 Disorder of the skin and subcutaneous tissue related to radiation, unspecified: Secondary | ICD-10-CM | POA: Diagnosis not present

## 2019-02-25 DIAGNOSIS — M542 Cervicalgia: Secondary | ICD-10-CM

## 2019-02-25 MED ORDER — MIRTAZAPINE 30 MG PO TBDP: 30.0000 mg | ORAL_TABLET | Freq: Every day | ORAL | 8 refills | Status: DC

## 2019-02-25 NOTE — Therapy (Signed)
[place], [place], [address] Phone: [phone]   Fax:  [phone]  Physical Therapy Treatment  Patient Details  Name: [name] MRN: [id number] Date of Birth: 01/22/1946 Referring Provider (PT): Reita May Date: 02/25/2019  PT End of Session - 02/25/19 1158    Visit Number  5    Number of Visits  9    Date for PT Re-Evaluation  03/11/19    PT Start Time  1104    PT Stop Time  1155    PT Time Calculation (min)  51 min    Activity Tolerance  Patient tolerated treatment well    Behavior During Therapy  [facility] for tasks assessed/performed       Past Medical History:  Diagnosis Date  . Allergy   . Alzheimer disease ([place]) 06/2018   diagnosed with early stage alzheimers  . Arthritis    osteoarthritis  . Cancer [facility])    testicular, age 73, orchiectomy 17  . Cataract    B/L CATARACTS NO SURGERY  . Dementia ([place])   . Depression   . Dyslipidemia   . Heartburn    TAKES ZANTAC  . History of radiation therapy 12/02/18- 01/14/19   Head and neck/ base of tongue. 60 Gy over 30 fractions.   . Hypertension   . Hypogonadism male   . Prostate cancer ([place]) 06/07/11   gleason 3+3=6, vol 59.5 cc  . Sleep apnea   . Status post chemotherapy    testicular cancer 1992, Dr Beryle Beams    Past Surgical History:  Procedure Laterality Date  . APPENDECTOMY    . COLONOSCOPY  2007   gessner  . DIRECT LARYNGOSCOPY N/A 08/19/2018   Procedure: DIRECT LARYNGOSCOPY;  Surgeon: Leta Baptist, MD;  Location: [place];  Service: ENT;  Laterality: N/A;  . INGUINAL HERNIA REPAIR     w/orchiectomy 1992, retroperitoneal lymph node dissection  . MENISECTOMY     R&L knee surgeries  . PROSTATE SURGERY     biopsy x 2  . TONGUE BIOPSY N/A 08/19/2018   Procedure: BIOPSY OF TONGUE BASE MASS;  Surgeon: Leta Baptist, MD;  Location: [place];  Service: ENT;  Laterality: N/A;    There were no  vitals filed for this visit.  Subjective Assessment - 02/25/19 1106    Subjective  My G-tube comes out on Friday and I'm so excited about that! It amazes me how tight my neck is when I get up but it loosens up as the day goes by.    Pertinent History  mid March 2020-TORS modified radical neck dissection for treatment of base of tongue and R tonsillar cancer, completed radiation in early June 2020, alzheimers disease, hx of testicular cancer, current prostate cancer    Patient Stated Goals  to not feel any discomfort in neck when eating    Currently in Pain?  No/denies   just tightness, worse upon wakening                      [facility] Adult PT Treatment/Exercise - 02/25/19 0001      Manual Therapy   Manual Therapy  Soft tissue mobilization;Myofascial release;Passive ROM;Manual Traction    Soft tissue mobilization  along bilateral SCM, lateral neck in area of scar and posterior neck to decrease muscle tension and tightness as well as bilateral upper traps, spent extra time on anterior right neck just under chin where pt has  pain when eating    Myofascial Release  To incision using cross hands technique with pt feeling good stetch, but needed light pressure as he did exhibit increased tenderness to touch inferior to mandible where pain with eating    Passive ROM  in supine with prolonged stretches into extension while applying pressure to origin of SCM, lateral flexion bilaterally while applying pressure to origin of SCM, rotation while applying pressure to origin of SCM opposite the side of rotation    Manual Traction  To cervical area before and after manual therapy 3x, 10-20 sec holds             PT Education - 02/25/19 1201    Education Details  See if he can tell if certain foods bring on pain with eating as this has been intermittent and start adding neck ROM stretches before and after eating to see if this alleviates some of discomfort/pain when eating.    Person(s)  Educated  Patient;Spouse    Methods  Explanation    Comprehension  Verbalized understanding          PT Long Term Goals - 02/11/19 1452      PT LONG TERM GOAL #1   Title  Pt will demonstrate improved neck extension to allow decreased discomfort    Baseline  50% limited    Time  4    Period  Weeks    Status  New    Target Date  03/11/19      PT LONG TERM GOAL #2   Title  Pt will report a 75% improvement in neck tightness and pain to allow improved comfort when eating    Time  4    Period  Weeks    Status  New    Target Date  03/11/19      PT LONG TERM GOAL #3   Title  Pt will be independent in a home exercise program for continued strengthening and stretching    Time  4    Period  Weeks    Status  New    Target Date  03/11/19            Plan - 02/25/19 1158    Clinical Impression Statement  Continued with STM and myofascial release to neck, especially over area of tightness anteriorly on Rt side. Pt reports good relief after each session with much less tightness reported and ability for improved bil cervical A/ROM. Pt still with mod tenderness inferior to Rt mnadible where pt c/o intermittent pain with eating.    Personal Factors and Comorbidities  Comorbidity 1;Comorbidity 2;Comorbidity 3+    Comorbidities  Alzheimer's, current prostate cancer, hx of testicular cancer    Examination-Activity Limitations  Other   discomfort with eating due to neck tightness   Stability/Clinical Decision Making  Evolving/Moderate complexity    Rehab Potential  Good    PT Frequency  2x / week    PT Duration  4 weeks    PT Treatment/Interventions  ADLs/Self Care Home Management;Therapeutic activities;Therapeutic exercise;Patient/family education;Manual techniques;Passive range of motion;Scar mobilization;Taping    PT Next Visit Plan  continue PROM to neck in all directions, massage to upper traps, SCM stretch, ask about exercises    PT Home Exercise Plan  head and neck ROM     Consulted and Agree with Plan of Care  Patient;Family member/caregiver    Family Member Consulted  wife       Patient will benefit from skilled therapeutic intervention in  order to improve the following deficits and impairments:  Decreased range of motion, Decreased scar mobility, Decreased strength, Increased fascial restricitons, Postural dysfunction, Pain  Visit Diagnosis: 1. Cervicalgia   2. Disorder of the skin and subcutaneous tissue related to radiation, unspecified   3. Abnormal posture        Problem List Patient Active Problem List   Diagnosis Date Noted  . Malignant neoplasm of base of tongue ([place]) 09/27/2018  . 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 Aortic atherosclerosis ([place]) 08/08/2018  . History of testicular cancer 08/08/2018  . Lymphadenitis 07/26/2018  . Mild cognitive impairment 09/11/2016  . Essential tremor 09/11/2016  . Hyperreflexia 06/08/2016  . Family history of heart disease in male family member before age 81 08/31/2015  . Depression 10/19/2014  . OSA (obstructive sleep apnea) 08/24/2014  . Hypertension   . Heartburn   . Arthritis   . History of prostate cancer 06/07/2011  . Hyperlipidemia 12/28/2010  . BPH (benign prostatic hypertrophy) with urinary retention 12/28/2010  . Hypogonadism male 12/28/2010    Otelia Limes, PTA 02/25/2019, 12:03 PM  [place], [place], [address] Phone: [phone]   Fax:  [phone]  Name: ELISHA COOKSEY MRN: [id number] Date of Birth: [birth year]

## 2019-02-25 NOTE — Progress Notes (Signed)
.  Psychiatric Initial Adult Assessment   Patient Identification: [name] MRN:  [id number] Date of Evaluation:  02/25/2019 Referral Source: Dr. Wells Guiles Tat Chief Complaint:   Visit Diagnosis: Major depression mild  History of Present Illness: Today the patient was interviewed with his wife [name].  The patient is actually doing better.  His mood seems to be improved.  He is sleeping well and eating well taking Remeron 30 mg SolTab.  He finishes radiation therapy and is in rehab at this time.  He is actually swallowing fairly well.  He is going to have his G-tube taken out in the next few weeks.  He will feel more like he is back to being normal.  He will feel more human.  His energy level is fairly good.  He is moved into a new townhouse which she seems to like.  He seems to be getting along better with his wife.  The patient has no evidence of psychosis.  He drinks no alcohol uses no drugs.  He does not have chronic pain.  Patient is positive and optimistic.  There apparently are no future plans to have anything like chemotherapy.  His weight is stable.  He still enjoys music he reads and watches a little nighttime TV.  He is functioning very well.  Depression Symptoms:  fatigue, (Hypo) Manic Symptoms:   Anxiety Symptoms:   Psychotic Symptoms:   PTSD Symptoms:   Past Psychiatric History: Trintellix, Wellbutrin  Previous Psychotropic Medications:   Substance Abuse History in the last 12 months:    Consequences of Substance Abuse:   Past Medical History:  Past Medical History:  Diagnosis Date  . Allergy   . Alzheimer disease ([place]) 06/2018   diagnosed with early stage alzheimers  . Arthritis    osteoarthritis  . Cancer [facility])    testicular, age 75, orchiectomy 55  . Cataract    B/L CATARACTS NO SURGERY  . Dementia ([place])   . Depression   . Dyslipidemia   . Heartburn    TAKES ZANTAC  . History of radiation therapy 12/02/18- 01/14/19   Head and neck/ base of tongue. 60 Gy  over 30 fractions.   . Hypertension   . Hypogonadism male   . Prostate cancer ([place]) 06/07/11   gleason 3+3=6, vol 59.5 cc  . Sleep apnea   . Status post chemotherapy    testicular cancer 1992, Dr Beryle Beams    Past Surgical History:  Procedure Laterality Date  . APPENDECTOMY    . COLONOSCOPY  2007   gessner  . DIRECT LARYNGOSCOPY N/A 08/19/2018   Procedure: DIRECT LARYNGOSCOPY;  Surgeon: Leta Baptist, MD;  Location: [place];  Service: ENT;  Laterality: N/A;  . INGUINAL HERNIA REPAIR     w/orchiectomy 1992, retroperitoneal lymph node dissection  . MENISECTOMY     R&L knee surgeries  . PROSTATE SURGERY     biopsy x 2  . TONGUE BIOPSY N/A 08/19/2018   Procedure: BIOPSY OF TONGUE BASE MASS;  Surgeon: Leta Baptist, MD;  Location: [place];  Service: ENT;  Laterality: N/A;    Family Psychiatric History:   Family History:  Family History  Problem Relation Age of Onset  . Lung cancer Mother   . Hypertension Mother   . Lung cancer Father   . Heart failure Father   . Hypertension Father     Social History:   Social History   Socioeconomic History  . Marital status: Married  Spouse name: Not on file  . Number of children: 0  . Years of education: Not on file  . Highest education level: Not on file  Occupational History  . Occupation: RETIRED    Employer: [employer]  Social Needs  . Financial resource strain: Not on file  . Food insecurity    Worry: Not on file    Inability: Not on file  . Transportation needs    Medical: No    Non-medical: No  Tobacco Use  . Smoking status: Former Smoker    Packs/day: 1.00    Years: 20.00    Pack years: 20.00    Types: Cigarettes    Quit date: 08/03/1999    Years since quitting: 19.5  . Smokeless tobacco: Never Used  Substance and Sexual Activity  . Alcohol use: Not Currently  . Drug use: No  . Sexual activity: Yes  Lifestyle  . Physical activity    Days per week: Not on file     Minutes per session: Not on file  . Stress: Not on file  Relationships  . Social Herbalist on phone: Not on file    Gets together: Not on file    Attends religious service: Not on file    Active member of club or organization: Not on file    Attends meetings of clubs or organizations: Not on file    Relationship status: Not on file  Other Topics Concern  . Not on file  Social History Narrative  . Not on file    Additional Social History:   Allergies:  No Known Allergies  Metabolic Disorder Labs: No results found for: HGBA1C, MPG No results found for: PROLACTIN Lab Results  Component Value Date   CHOL 146 09/17/2017   TRIG 63 09/17/2017   HDL 63 09/17/2017   CHOLHDL 2.3 09/17/2017   VLDL 18 09/11/2016   LDLCALC 70 09/17/2017   LDLCALC 67 09/11/2016   Lab Results  Component Value Date   TSH 2.057 09/19/2018    Therapeutic Level Labs: No results found for: LITHIUM No results found for: CBMZ No results found for: VALPROATE  Current Medications: Current Outpatient Medications  Medication Sig Dispense Refill  . donepezil (ARICEPT) 10 MG tablet Take 1 tablet (10 mg total) by mouth at bedtime. 90 tablet 2  . HYDROcodone-acetaminophen (NORCO/VICODIN) 5-325 MG tablet Take 1 tablet by mouth every 6 (six) hours as needed for moderate pain. For up to 5 days    . lidocaine (XYLOCAINE) 2 % solution Patient: Mix 1part 2% viscous lidocaine, 1part H20. Swish & swallow 25mL of diluted mixture, 8min before meals and at bedtime, up to QID 100 mL 5  . mirtazapine (REMERON SOLTAB) 30 MG disintegrating tablet Take 1 tablet (30 mg total) by mouth at bedtime. 30 tablet 8  . ondansetron (ZOFRAN-ODT) 4 MG disintegrating tablet Take 4 mg by mouth every 8 (eight) hours as needed for nausea.    . polyethylene glycol powder (GLYCOLAX/MIRALAX) powder Take 17 g by mouth daily. 3350 g 11  . primidone (MYSOLINE) 50 MG tablet Take 1 tablet (50 mg total) by mouth 2 (two) times daily. 180  tablet 2  . rosuvastatin (CRESTOR) 20 MG tablet TAKE 1 TABLET(20 MG) BY MOUTH DAILY 90 tablet 0  . sodium fluoride (PREVIDENT 5000 PLUS) 1.1 % CREA dental cream Apply cream to tooth brush. Brush teeth for 2 minutes. Spit out excess. DO NOT rinse afterwards. Repeat nightly. 1 Tube prn  .  terazosin (HYTRIN) 2 MG capsule TAKE 1 CAPSULE(2 MG) BY MOUTH DAILY 90 capsule 1  . triamcinolone (NASACORT ALLERGY 24HR) 55 MCG/ACT AERO nasal inhaler Place 1 spray into the nose daily.    [place] Kitchen vortioxetine HBr (TRINTELLIX) 20 MG TABS tablet Take 1 tablet (20 mg total) by mouth daily. 90 tablet 1   No current facility-administered medications for this visit.     Musculoskeletal: Strength & Muscle Tone: within normal limits Gait & Station: normal Patient leans: N/A  Psychiatric Specialty Exam: ROS  There were no vitals taken for this visit.There is no height or weight on file to calculate BMI.  General Appearance: Casual  Eye Contact:  Good  Speech:  Normal Rate  Volume:  Normal  Mood:  Negative  Affect:  Congruent  Thought Process:  Goal Directed  Orientation:  Full (Time, Place, and Person)  Thought Content:  Logical  Suicidal Thoughts:  No  Homicidal Thoughts:  No  Memory:  NA  Judgement:  Good  Insight:  Fair  Psychomotor Activity:  Normal  Concentration:    Recall:  [place] of Knowledge:Good  Language: Good  Akathisia:  No  Handed:  Right  AIMS (if indicated):  not done  Assets:  Desire for Improvement Financial Resources/Insurance  ADL's:  Intact  Cognition: Impaired,  Moderate  Sleep:     Screenings: Mini-Mental     Office Visit from 06/08/2016 in [place]  Total Score (max 30 points )  28    PHQ2-9     Office Visit from 09/18/2018 in [place] Visit from 09/17/2017 in [place] Visit from 09/11/2016 in [place] Visit from 08/31/2015 in [place] Visit from 08/24/2014 in [place]  PHQ-2 Total Score  6  4  4  6  6   PHQ-9 Total Score  19  10  -  16  -      Assessment and Plan:   At this time the patient is problem is that of major depression in remission.  I believe Remeron overall has been helpful for him.  He will continue taking it as prescribed.  His depression symptoms were minimal.  He is appreciative and positive.  He is agreed to continue taking Remeron and to return to see me in 6 months.  Patient wears masks and is very cautious about his health care.  He takes the virus seriously as he should.  The patient is not suicidal.  He is functioning reasonably well. Jerral Ralph, MD 7/21/20203:15 PM

## 2019-02-27 ENCOUNTER — Ambulatory Visit: Payer: Medicare Other

## 2019-02-27 ENCOUNTER — Other Ambulatory Visit: Payer: Self-pay

## 2019-02-27 DIAGNOSIS — L599 Disorder of the skin and subcutaneous tissue related to radiation, unspecified: Secondary | ICD-10-CM

## 2019-02-27 DIAGNOSIS — R293 Abnormal posture: Secondary | ICD-10-CM

## 2019-02-27 DIAGNOSIS — M542 Cervicalgia: Secondary | ICD-10-CM

## 2019-02-27 NOTE — Therapy (Signed)
[place], [place], [address] Phone: [phone]   Fax:  [phone]  Physical Therapy Treatment  Patient Details  Name: [name] MRN: [id number] Date of Birth: [birth year] Referring Provider (PT): Reita May Date: 02/27/2019  PT End of Session - 02/27/19 1156    Visit Number  6    Number of Visits  9    Date for PT Re-Evaluation  03/11/19    PT Start Time  1059    PT Stop Time  1150    PT Time Calculation (min)  51 min    Activity Tolerance  Patient tolerated treatment well    Behavior During Therapy  [facility] for tasks assessed/performed       Past Medical History:  Diagnosis Date  . Allergy   . Alzheimer disease ([place]) 06/2018   diagnosed with early stage alzheimers  . Arthritis    osteoarthritis  . Cancer [facility])    testicular, age 77, orchiectomy 34  . Cataract    B/L CATARACTS NO SURGERY  . Dementia ([place])   . Depression   . Dyslipidemia   . Heartburn    TAKES ZANTAC  . History of radiation therapy 12/02/18- 01/14/19   Head and neck/ base of tongue. 60 Gy over 30 fractions.   . Hypertension   . Hypogonadism male   . Prostate cancer ([place]) 06/07/11   gleason 3+3=6, vol 59.5 cc  . Sleep apnea   . Status post chemotherapy    testicular cancer 1992, Dr Beryle Beams    Past Surgical History:  Procedure Laterality Date  . APPENDECTOMY    . COLONOSCOPY  2007   gessner  . DIRECT LARYNGOSCOPY N/A 08/19/2018   Procedure: DIRECT LARYNGOSCOPY;  Surgeon: Leta Baptist, MD;  Location: [facility];  Service: ENT;  Laterality: N/A;  . INGUINAL HERNIA REPAIR     w/orchiectomy 1992, retroperitoneal lymph node dissection  . MENISECTOMY     R&L knee surgeries  . PROSTATE SURGERY     biopsy x 2  . TONGUE BIOPSY N/A 08/19/2018   Procedure: BIOPSY OF TONGUE BASE MASS;  Surgeon: Leta Baptist, MD;  Location: [place];  Service: ENT;  Laterality: N/A;    There were no  vitals filed for this visit.  Subjective Assessment - 02/27/19 1101    Subjective  I had some pain with chewing this morning but it wasn't as intense as it's been. My neck felt better with less tightness for a day after last session.    Pertinent History  mid March 2020-TORS modified radical neck dissection for treatment of base of tongue and R tonsillar cancer, completed radiation in early June 2020, alzheimers disease, hx of testicular cancer, current prostate cancer    Patient Stated Goals  to not feel any discomfort in neck when eating    Currently in Pain?  No/denies                       [facility] Adult PT Treatment/Exercise - 02/27/19 0001      Manual Therapy   Manual Therapy  Soft tissue mobilization;Myofascial release;Passive ROM;Manual Traction    Soft tissue mobilization  along bilateral SCM, lateral neck in area of scar and posterior neck to decrease muscle tension and tightness as well as bilateral upper traps, spent extra time on anterior right neck just under chin where pt has pain when eating    Myofascial Release  To  incision and area of healed wound using cross hands technique with pt feeling good stetch, but needed light pressure as he did exhibit increased tenderness to touch inferior to mandible where pain with eating where wound was    Passive ROM  in supine with prolonged stretches into extension while applying pressure to origin of SCM, lateral flexion bilaterally while applying pressure to origin of SCM, rotation while applying pressure to origin of SCM opposite the side of rotation    Manual Traction  To cervical area before and after manual therapy 3x, 10-20 sec holds                  PT Long Term Goals - 02/11/19 1452      PT LONG TERM GOAL #1   Title  Pt will demonstrate improved neck extension to allow decreased discomfort    Baseline  50% limited    Time  4    Period  Weeks    Status  New    Target Date  03/11/19      PT LONG TERM  GOAL #2   Title  Pt will report a 75% improvement in neck tightness and pain to allow improved comfort when eating    Time  4    Period  Weeks    Status  New    Target Date  03/11/19      PT LONG TERM GOAL #3   Title  Pt will be independent in a home exercise program for continued strengthening and stretching    Time  4    Period  Weeks    Status  New    Target Date  03/11/19            Plan - 02/27/19 1156    Clinical Impression Statement  Continued with manual therapy but spent more time at area where hole developed after surgery and pt had small wound for a few weeks. Pt reports this area most sensitive and this is where he has pain when eating.  Also encouraged him and instructed while performing to add myofascial release to this area as tolerated.He reports not being consistent with HEP at this time so encouraged him to be more compliant as this wil further aid in his recovery. He verbalized understanding.    Personal Factors and Comorbidities  Comorbidity 1;Comorbidity 2;Comorbidity 3+    Comorbidities  Alzheimer's, current prostate cancer, hx of testicular cancer    Examination-Activity Limitations  Other   discomfort with eating due to neck tightness   Stability/Clinical Decision Making  Evolving/Moderate complexity    Rehab Potential  Good    PT Frequency  2x / week    PT Duration  4 weeks    PT Treatment/Interventions  ADLs/Self Care Home Management;Therapeutic activities;Therapeutic exercise;Patient/family education;Manual techniques;Passive range of motion;Scar mobilization;Taping    PT Next Visit Plan  continue PROM to neck in all directions, massage to upper traps, SCM stretch, ask about exercises and if he has tried myofascial release to scar at wound    PT Home Exercise Plan  head and neck ROM    Consulted and Agree with Plan of Care  Patient;Family member/caregiver    Family Member Consulted  wife       Patient will benefit from skilled therapeutic intervention  in order to improve the following deficits and impairments:  Decreased range of motion, Decreased scar mobility, Decreased strength, Increased fascial restricitons, Postural dysfunction, Pain  Visit Diagnosis: 1. Cervicalgia   2. Disorder  of the skin and subcutaneous tissue related to radiation, unspecified   3. Abnormal posture        Problem List Patient Active Problem List   Diagnosis Date Noted  . 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 Aortic atherosclerosis ([place]) 08/08/2018  . History of testicular cancer 08/08/2018  . Lymphadenitis 07/26/2018  . Mild cognitive impairment 09/11/2016  . Essential tremor 09/11/2016  . Hyperreflexia 06/08/2016  . Family history of heart disease in male family member before age 81 08/31/2015  . Depression 10/19/2014  . OSA (obstructive sleep apnea) 08/24/2014  . Hypertension   . Heartburn   . Arthritis   . History of prostate cancer 06/07/2011  . Hyperlipidemia 12/28/2010  . BPH (benign prostatic hypertrophy) with urinary retention 12/28/2010  . Hypogonadism male 12/28/2010    Otelia Limes, PTA 02/27/2019, 12:01 PM  [place], [place], [address] Phone: [phone]   Fax:  [phone]  Name: [name] MRN: [id number] Date of Birth: [birth year]

## 2019-02-28 ENCOUNTER — Encounter (HOSPITAL_COMMUNITY): Payer: Self-pay | Admitting: Radiology

## 2019-02-28 ENCOUNTER — Ambulatory Visit (HOSPITAL_COMMUNITY)
Admission: RE | Admit: 2019-02-28 | Discharge: 2019-02-28 | Disposition: A | Discharge status: Home / Self Care | Payer: Medicare Other | Source: Ambulatory Visit | Attending: Radiation Oncology | Admitting: Radiation Oncology

## 2019-02-28 ENCOUNTER — Other Ambulatory Visit: Payer: Self-pay

## 2019-02-28 DIAGNOSIS — C029 Malignant neoplasm of tongue, unspecified: Secondary | ICD-10-CM | POA: Insufficient documentation

## 2019-02-28 DIAGNOSIS — C01 Malignant neoplasm of base of tongue: Secondary | ICD-10-CM

## 2019-02-28 DIAGNOSIS — Z431 Encounter for attention to gastrostomy: Secondary | ICD-10-CM | POA: Diagnosis present

## 2019-02-28 HISTORY — PX: IR GASTROSTOMY TUBE REMOVAL: IMG5492

## 2019-02-28 MED ORDER — LIDOCAINE VISCOUS HCL 2 % MT SOLN
OROMUCOSAL | Status: AC
  Filled 2019-02-28: qty 15

## 2019-02-28 NOTE — Procedures (Signed)
Per order of Dr. Isidore Moos patient's balloon retention gastrostomy tube was removed in its entirety without immediate complications.  Gauze dressing applied over site. EBL none.

## 2019-03-04 ENCOUNTER — Ambulatory Visit: Payer: Medicare Other | Admitting: Physical Therapy

## 2019-03-04 ENCOUNTER — Other Ambulatory Visit: Payer: Self-pay

## 2019-03-04 ENCOUNTER — Encounter: Payer: Self-pay | Admitting: Physical Therapy

## 2019-03-04 DIAGNOSIS — L599 Disorder of the skin and subcutaneous tissue related to radiation, unspecified: Secondary | ICD-10-CM | POA: Diagnosis not present

## 2019-03-04 DIAGNOSIS — M542 Cervicalgia: Secondary | ICD-10-CM

## 2019-03-04 DIAGNOSIS — R293 Abnormal posture: Secondary | ICD-10-CM

## 2019-03-04 NOTE — Therapy (Signed)
[place], [place], [address] Phone: [phone]   Fax:  [phone]  Physical Therapy Treatment  Patient Details  Name: [name] MRN: [id number] Date of Birth: [birth year] Referring Provider (PT): Reita May Date: 03/04/2019  PT End of Session - 03/04/19 1455    Visit Number  7    Number of Visits  9    Date for PT Re-Evaluation  03/11/19    PT Start Time  1400    PT Stop Time  1445    PT Time Calculation (min)  45 min    Activity Tolerance  Patient tolerated treatment well    Behavior During Therapy  [facility] for tasks assessed/performed       Past Medical History:  Diagnosis Date  . Allergy   . Alzheimer disease ([place]) 06/2018   diagnosed with early stage alzheimers  . Arthritis    osteoarthritis  . Cancer [facility]    testicular, age 73, orchiectomy 17  . Cataract    B/L CATARACTS NO SURGERY  . Dementia ([place])   . Depression   . Dyslipidemia   . Heartburn    TAKES ZANTAC  . History of radiation therapy 12/02/18- 01/14/19   Head and neck/ base of tongue. 60 Gy over 30 fractions.   . Hypertension   . Hypogonadism male   . Prostate cancer ([place]) 06/07/11   gleason 3+3=6, vol 59.5 cc  . Sleep apnea   . Status post chemotherapy    testicular cancer 1992, Dr Beryle Beams    Past Surgical History:  Procedure Laterality Date  . APPENDECTOMY    . COLONOSCOPY  2007   gessner  . DIRECT LARYNGOSCOPY N/A 08/19/2018   Procedure: DIRECT LARYNGOSCOPY;  Surgeon: Leta Baptist, MD;  Location: [place];  Service: ENT;  Laterality: N/A;  . INGUINAL HERNIA REPAIR     w/orchiectomy 1992, retroperitoneal lymph node dissection  . IR GASTROSTOMY TUBE REMOVAL  02/28/2019  . MENISECTOMY     R&L knee surgeries  . PROSTATE SURGERY     biopsy x 2  . TONGUE BIOPSY N/A 08/19/2018   Procedure: BIOPSY OF TONGUE BASE MASS;  Surgeon: Leta Baptist, MD;  Location: [place];  Service:  ENT;  Laterality: N/A;    There were no vitals filed for this visit.  Subjective Assessment - 03/04/19 1406    Subjective  Pt's wife reports he started having some swelling on his right lateral neck just superior to his scar that began last night. Pt also reports Pt reports tenderness and pain in this area when chewing.    Pertinent History  mid March 2020-TORS modified radical neck dissection for treatment of base of tongue and R tonsillar cancer, completed radiation in early June 2020, alzheimers disease, hx of testicular cancer, current prostate cancer    Patient Stated Goals  to not feel any discomfort in neck when eating    Currently in Pain?  No/denies    Pain Score  0-No pain                       [facility] Adult PT Treatment/Exercise - 03/04/19 0001      Manual Therapy   Soft tissue mobilization  along bilateral SCM, lateral neck in area of scar and posterior neck to decrease muscle tension and tightness as well as bilateral upper traps, and on anterior right neck just under chin where pt has pain when  eating    Myofascial Release  To incision and area of healed wound using cross hands technique with pt feeling good stetch, but needed light pressure as he did exhibit increased tenderness to touch inferior to mandible where pain with eating where wound was    Passive ROM  in supine with prolonged stretches into extension while applying pressure to origin of SCM, lateral flexion bilaterally while applying pressure to origin of SCM, rotation while applying pressure to origin of SCM opposite the side of rotation    Manual Traction  To cervical area 1x, 20 sec holds                  PT Long Term Goals - 02/11/19 1452      PT LONG TERM GOAL #1   Title  Pt will demonstrate improved neck extension to allow decreased discomfort    Baseline  50% limited    Time  4    Period  Weeks    Status  New    Target Date  03/11/19      PT LONG TERM GOAL #2   Title  Pt will  report a 75% improvement in neck tightness and pain to allow improved comfort when eating    Time  4    Period  Weeks    Status  New    Target Date  03/11/19      PT LONG TERM GOAL #3   Title  Pt will be independent in a home exercise program for continued strengthening and stretching    Time  4    Period  Weeks    Status  New    Target Date  03/11/19            Plan - 03/04/19 1455    Clinical Impression Statement  Pt is having some slight swelling in area just superior to scar on right lateral neck. He reports recent increase in pain and tenderness in this area when eating. Sent inbasket message to referring Dr about this. Encouraged pt to follow up with doctor about this due to recent onset. Continued stretching and manual therapy to decrease tightness. Pt reports not being compliant with her home exercise program. Educated pt on importance of performing exercises daily to keep neck from becoming tight.    Rehab Potential  Good    PT Frequency  2x / week    PT Duration  4 weeks    PT Treatment/Interventions  ADLs/Self Care Home Management;Therapeutic activities;Therapeutic exercise;Patient/family education;Manual techniques;Passive range of motion;Scar mobilization;Taping    PT Next Visit Plan  see if pt followed up with Dr. Isidore Moos about swelling, continue PROM to neck in all directions, massage to upper traps, SCM stretch, ask about exercises and if he has tried myofascial release to scar at wound    PT Home Exercise Plan  head and neck ROM    Consulted and Agree with Plan of Care  Patient;Family member/caregiver       Patient will benefit from skilled therapeutic intervention in order to improve the following deficits and impairments:  Decreased range of motion, Decreased scar mobility, Decreased strength, Increased fascial restricitons, Postural dysfunction, Pain  Visit Diagnosis: 1. Cervicalgia   2. Disorder of the skin and subcutaneous tissue related to radiation,  unspecified   3. Abnormal posture        Problem List Patient Active Problem List   Diagnosis Date Noted  . 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 Aortic atherosclerosis ([place]) 08/08/2018  . History of testicular cancer 08/08/2018  . Lymphadenitis 07/26/2018  . Mild cognitive impairment 09/11/2016  . Essential tremor 09/11/2016  . Hyperreflexia 06/08/2016  . Family history of heart disease in male family member before age 51 08/31/2015  . Depression 10/19/2014  . OSA (obstructive sleep apnea) 08/24/2014  . Hypertension   . Heartburn   . Arthritis   . History of prostate cancer 06/07/2011  . Hyperlipidemia 12/28/2010  . BPH (benign prostatic hypertrophy) with urinary retention 12/28/2010  . Hypogonadism male 12/28/2010    Allyson Sabal [facility] 03/04/2019, 3:00 PM  [place], [place], [address] Phone: [phone]   Fax:  [phone]  Name: [name] MRN: [id number] Date of Birth: [birth year]  Manus Gunning, PT 03/04/19 3:00 PM

## 2019-03-06 ENCOUNTER — Ambulatory Visit: Payer: Medicare Other | Admitting: Physical Therapy

## 2019-03-06 ENCOUNTER — Telehealth: Payer: Self-pay | Admitting: *Deleted

## 2019-03-06 ENCOUNTER — Encounter: Payer: Self-pay | Admitting: Physical Therapy

## 2019-03-06 ENCOUNTER — Other Ambulatory Visit: Payer: Self-pay

## 2019-03-06 DIAGNOSIS — M542 Cervicalgia: Secondary | ICD-10-CM

## 2019-03-06 DIAGNOSIS — L599 Disorder of the skin and subcutaneous tissue related to radiation, unspecified: Secondary | ICD-10-CM

## 2019-03-06 DIAGNOSIS — R293 Abnormal posture: Secondary | ICD-10-CM

## 2019-03-06 NOTE — Therapy (Signed)
[place], [place], [address] Phone: [phone]   Fax:  [phone]  Physical Therapy Treatment  Patient Details  Name: [name] MRN: [id number] Date of Birth: [birth year] Referring Provider (PT): Reita May Date: 03/06/2019  PT End of Session - 03/06/19 5170    Visit Number  8    Number of Visits  9    Date for PT Re-Evaluation  03/11/19    PT Start Time  1400    PT Stop Time  1446    PT Time Calculation (min)  46 min    Activity Tolerance  Patient tolerated treatment well    Behavior During Therapy  [facility] for tasks assessed/performed       Past Medical History:  Diagnosis Date  . Allergy   . Alzheimer disease ([place]) 06/2018   diagnosed with early stage alzheimers  . Arthritis    osteoarthritis  . Cancer [facility])    testicular, age 74, orchiectomy 9  . Cataract    B/L CATARACTS NO SURGERY  . Dementia ([place])   . Depression   . Dyslipidemia   . Heartburn    TAKES ZANTAC  . History of radiation therapy 12/02/18- 01/14/19   Head and neck/ base of tongue. 60 Gy over 30 fractions.   . Hypertension   . Hypogonadism male   . Prostate cancer ([place]) 06/07/11   gleason 3+3=6, vol 59.5 cc  . Sleep apnea   . Status post chemotherapy    testicular cancer 1992, Dr Beryle Beams    Past Surgical History:  Procedure Laterality Date  . APPENDECTOMY    . COLONOSCOPY  2007   gessner  . DIRECT LARYNGOSCOPY N/A 08/19/2018   Procedure: DIRECT LARYNGOSCOPY;  Surgeon: Leta Baptist, MD;  Location: [place];  Service: ENT;  Laterality: N/A;  . INGUINAL HERNIA REPAIR     w/orchiectomy 1992, retroperitoneal lymph node dissection  . IR GASTROSTOMY TUBE REMOVAL  02/28/2019  . MENISECTOMY     R&L knee surgeries  . PROSTATE SURGERY     biopsy x 2  . TONGUE BIOPSY N/A 08/19/2018   Procedure: BIOPSY OF TONGUE BASE MASS;  Surgeon: Leta Baptist, MD;  Location: [place];  Service:  ENT;  Laterality: N/A;    There were no vitals filed for this visit.  Subjective Assessment - 03/06/19 1402    Subjective  My neck is not great today but I have done my exerciess for 2 days now. I am still having that pain in my jaw that goes up to the top tooth.    Pertinent History  mid March 2020-TORS modified radical neck dissection for treatment of base of tongue and R tonsillar cancer, completed radiation in early June 2020, alzheimers disease, hx of testicular cancer, current prostate cancer    Patient Stated Goals  to not feel any discomfort in neck when eating    Currently in Pain?  No/denies    Pain Score  0-No pain                       [facility] Adult PT Treatment/Exercise - 03/06/19 0001      Manual Therapy   Soft tissue mobilization  along bilateral SCM, lateral neck in area of scar and posterior neck to decrease muscle tension and tightness as well as bilateral upper traps, and on anterior right neck just under chin where pt has pain when eating  Myofascial Release  To incision and area of healed wound using cross hands technique with pt in left cervical rotation                   PT Long Term Goals - 03/06/19 1405      PT LONG TERM GOAL #1   Title  Pt will demonstrate improved neck extension to allow decreased discomfort    Baseline  50% limited, 03/06/19- 40% limited now    Time  4    Period  Weeks    Status  On-going      PT LONG TERM GOAL #2   Title  03/06/19- 80% improvement    Time  4    Period  Weeks    Status  Achieved      PT LONG TERM GOAL #3   Title  Pt will be independent in a home exercise program for continued strengthening and stretching    Time  4    Period  Weeks    Status  Achieved            Plan - 03/06/19 1454    Clinical Impression Statement  Continued with soft tissue mobilization to anterior, lateral and posterior neck as well as upper traps to decrease discomfort. Pt is to be scheduled for a visit with  Dr. Benjamine Mola regarding the new onset of pain and swelling just anterior to the scar on his right lateral neck. Will add goals as needed depending on what he finds out at this visit. To decrease to 1x/wk and have pt begin doing more at home to address tightness and discomfort.    Stability/Clinical Decision Making  Evolving/Moderate complexity    Rehab Potential  Good    PT Frequency  1x / week    PT Duration  4 weeks    PT Treatment/Interventions  ADLs/Self Care Home Management;Therapeutic activities;Therapeutic exercise;Patient/family education;Manual techniques;Passive range of motion;Scar mobilization;Taping    PT Next Visit Plan  recert for 1x/wk for 4 more weeks, goals addressed at this session, see if pt followed up with Dr. Benjamine Mola about swelling, continue PROM to neck in all directions, massage to upper traps, SCM stretch, ask about exercises and if he has tried myofascial release to scar at wound    Consulted and Agree with Plan of Care  Patient;Family member/caregiver    Family Member Consulted  wife       Patient will benefit from skilled therapeutic intervention in order to improve the following deficits and impairments:  Decreased range of motion, Decreased scar mobility, Decreased strength, Increased fascial restricitons, Postural dysfunction, Pain  Visit Diagnosis: 1. Cervicalgia   2. Disorder of the skin and subcutaneous tissue related to radiation, unspecified   3. Abnormal posture        Problem List Patient Active Problem List   Diagnosis Date Noted  . 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 Aortic atherosclerosis ([place]) 08/08/2018  . History of testicular cancer 08/08/2018  . Lymphadenitis 07/26/2018  . Mild cognitive impairment 09/11/2016  . Essential tremor 09/11/2016  . Hyperreflexia 06/08/2016  . Family history of heart disease in male family member before age 74 08/31/2015  . Depression  10/19/2014  . OSA (obstructive sleep apnea) 08/24/2014  . Hypertension   . Heartburn   . Arthritis   . History of prostate cancer 06/07/2011  . Hyperlipidemia 12/28/2010  . BPH (benign prostatic hypertrophy) with urinary  retention 12/28/2010  . Hypogonadism male 12/28/2010    Allyson Sabal [facility] 03/06/2019, 2:58 PM  [place], [place], [address] Phone: [phone]   Fax:  [phone]  Name: [name] MRN: [id number] Date of Birth: [birth year]  Manus Gunning, PT 03/06/19 2:58 PM

## 2019-03-07 NOTE — Telephone Encounter (Signed)
Oncology Nurse Navigator Documentation  In follow-up to pt's recent PT appt and per Dr. Isidore Moos, called [facility] office.  Spoke with Anderson Malta, requested next available appt for evaluation of tender/ swollen area in neck.  She voiced understanding.  Addendum:  Rec'd VMM [id number] indicating appt scheduled for 8/7 1440.  Dr. Isidore Moos updated.  [name], RN, BSN Head & Neck Oncology Nurse [place] at [place] [phone]

## 2019-03-11 ENCOUNTER — Ambulatory Visit: Payer: Medicare Other | Attending: Radiation Oncology

## 2019-03-11 ENCOUNTER — Other Ambulatory Visit: Payer: Self-pay

## 2019-03-11 DIAGNOSIS — M542 Cervicalgia: Secondary | ICD-10-CM | POA: Insufficient documentation

## 2019-03-11 DIAGNOSIS — L599 Disorder of the skin and subcutaneous tissue related to radiation, unspecified: Secondary | ICD-10-CM | POA: Diagnosis present

## 2019-03-11 DIAGNOSIS — R293 Abnormal posture: Secondary | ICD-10-CM | POA: Diagnosis present

## 2019-03-11 DIAGNOSIS — R600 Localized edema: Secondary | ICD-10-CM | POA: Insufficient documentation

## 2019-03-11 NOTE — Therapy (Signed)
[place], [place], [address] Phone: [phone]   Fax:  [phone]  Physical Therapy Treatment  Patient Details  Name: [name] MRN: [id number] Date of Birth: Aug 28, 1945 Referring Provider (PT): Reita May Date: 03/11/2019  PT End of Session - 03/11/19 1155    Visit Number  9    Number of Visits  9    Date for PT Re-Evaluation  03/11/19    PT Start Time  1103    PT Stop Time  1154    PT Time Calculation (min)  51 min    Activity Tolerance  Patient tolerated treatment well    Behavior During Therapy  [facility] for tasks assessed/performed       Past Medical History:  Diagnosis Date  . Allergy   . Alzheimer disease ([place]) 06/2018   diagnosed with early stage alzheimers  . Arthritis    osteoarthritis  . Cancer [facility])    testicular, age 63, orchiectomy 71  . Cataract    B/L CATARACTS NO SURGERY  . Dementia ([place])   . Depression   . Dyslipidemia   . Heartburn    TAKES ZANTAC  . History of radiation therapy 12/02/18- 01/14/19   Head and neck/ base of tongue. 60 Gy over 30 fractions.   . Hypertension   . Hypogonadism male   . Prostate cancer ([place]) 06/07/11   gleason 3+3=6, vol 59.5 cc  . Sleep apnea   . Status post chemotherapy    testicular cancer 1992, Dr Beryle Beams    Past Surgical History:  Procedure Laterality Date  . APPENDECTOMY    . COLONOSCOPY  2007   gessner  . DIRECT LARYNGOSCOPY N/A 08/19/2018   Procedure: DIRECT LARYNGOSCOPY;  Surgeon: Leta Baptist, MD;  Location: [facility];  Service: ENT;  Laterality: N/A;  . INGUINAL HERNIA REPAIR     w/orchiectomy 1992, retroperitoneal lymph node dissection  . IR GASTROSTOMY TUBE REMOVAL  02/28/2019  . MENISECTOMY     R&L knee surgeries  . PROSTATE SURGERY     biopsy x 2  . TONGUE BIOPSY N/A 08/19/2018   Procedure: BIOPSY OF TONGUE BASE MASS;  Surgeon: Leta Baptist, MD;  Location: [facility];  Service:  ENT;  Laterality: N/A;    There were no vitals filed for this visit.  Subjective Assessment - 03/11/19 1107    Subjective  My neck is okay today, just feels stiff. I see Dr. Benjamine Mola on Friday. In the past few days I've started noticing 2 small areas that feel sensitive but I can't feel anything there.  I am argeeable, as Balire and I discussed at last session, to cont but decr to 1x/wk.    Pertinent History  mid March 2020-TORS modified radical neck dissection for treatment of base of tongue and R tonsillar cancer, completed radiation in early June 2020, alzheimers disease, hx of testicular cancer, current prostate cancer    Patient Stated Goals  to not feel any discomfort in neck when eating    Currently in Pain?  No/denies                       [facility] Adult PT Treatment/Exercise - 03/11/19 0001      Manual Therapy   Soft tissue mobilization  With massage cream along bilateral SCM, lateral neck in area of scar and posterior neck to decrease muscle tension and tightness as well as bilateral upper traps, and  on anterior right neck just under chin where pt has pain when eating    Myofascial Release  To incision and area of healed wound using cross hands technique with pt in left cervical rotation                   PT Long Term Goals - 03/11/19 1110      PT LONG TERM GOAL #1   Title  Pt will demonstrate improved neck extension to allow decreased discomfort    Baseline  50% limited, 03/06/19- 40% limited now;  ~20% limited at this time - 03/11/19    Status  Achieved      PT LONG TERM GOAL #2   Title  Pt will report a 75% improvement in neck tightness and pain to allow improved comfort when eating    Baseline  Pt reports 80% improvement with neck tightness - 03/11/19    Status  Achieved      PT LONG TERM GOAL #3   Title  Pt will be independent in a home exercise program for continued strengthening and stretching    Status  Achieved      PT LONG TERM GOAL #4   Title   --            Plan - 03/11/19 1156    Clinical Impression Statement  Reviewed goals today and pt has made excellent progress meeting al though does still present with tightness at end passive ranges of bil cervical rotation and side bending. Also pt still has c/o of increased sensitivity to touch at and inferior to wound incision and along lateral neck incision. Continued with manual therapy today to address these deficits and pt could benefit from continued therapy for improved end cervical ROM and desensitization techniques.    Personal Factors and Comorbidities  Comorbidity 1;Comorbidity 2;Comorbidity 3+    Comorbidities  Alzheimer's, current prostate cancer, hx of testicular cancer    Examination-Activity Limitations  Other   discomfort with eating due to neck tightness   Stability/Clinical Decision Making  Evolving/Moderate complexity    Rehab Potential  Good    PT Frequency  1x / week    PT Duration  4 weeks    PT Treatment/Interventions  ADLs/Self Care Home Management;Therapeutic activities;Therapeutic exercise;Patient/family education;Manual techniques;Passive range of motion;Scar mobilization;Taping    PT Next Visit Plan  recert for 1x/wk for 4 more weeks, goals addressed at this session, see ihow visit went with Dr. Benjamine Mola about swelling, continue PROM to neck in all directions, massage to upper traps, SCM stretch, ask about exercises and if he has tried myofascial release to scar at wound    PT Home Exercise Plan  head and neck ROM    Family Member Consulted  wife       Patient will benefit from skilled therapeutic intervention in order to improve the following deficits and impairments:  Decreased range of motion, Decreased scar mobility, Decreased strength, Increased fascial restricitons, Postural dysfunction, Pain  Visit Diagnosis: 1. Cervicalgia   2. Disorder of the skin and subcutaneous tissue related to radiation, unspecified   3. Abnormal posture        Problem  List Patient Active Problem List   Diagnosis Date Noted  . 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 Aortic atherosclerosis ([place]) 08/08/2018  . History of testicular cancer 08/08/2018  . Lymphadenitis 07/26/2018  . Mild cognitive impairment 09/11/2016  . Essential  tremor 09/11/2016  . Hyperreflexia 06/08/2016  . Family history of heart disease in male family member before age 73 08/31/2015  . Depression 10/19/2014  . OSA (obstructive sleep apnea) 08/24/2014  . Hypertension   . Heartburn   . Arthritis   . History of prostate cancer 06/07/2011  . Hyperlipidemia 12/28/2010  . BPH (benign prostatic hypertrophy) with urinary retention 12/28/2010  . Hypogonadism male 12/28/2010    Otelia Limes, PTA 03/11/2019, 12:01 PM  [place], [place], [address] Phone: [phone]   Fax:  [phone]  Name: [name] MRN: [id number] Date of Birth: Oct 10, 1945

## 2019-03-13 ENCOUNTER — Ambulatory Visit (HOSPITAL_COMMUNITY): Payer: Medicare Other | Admitting: Psychiatry

## 2019-03-20 ENCOUNTER — Other Ambulatory Visit: Payer: Self-pay

## 2019-03-20 ENCOUNTER — Ambulatory Visit: Payer: Medicare Other | Admitting: Physical Therapy

## 2019-03-20 ENCOUNTER — Encounter: Payer: Self-pay | Admitting: Physical Therapy

## 2019-03-20 DIAGNOSIS — M542 Cervicalgia: Secondary | ICD-10-CM | POA: Diagnosis not present

## 2019-03-20 DIAGNOSIS — R293 Abnormal posture: Secondary | ICD-10-CM

## 2019-03-20 DIAGNOSIS — L599 Disorder of the skin and subcutaneous tissue related to radiation, unspecified: Secondary | ICD-10-CM

## 2019-03-20 DIAGNOSIS — R600 Localized edema: Secondary | ICD-10-CM

## 2019-03-20 NOTE — Therapy (Signed)
[place], [place], [address] Phone: [phone]   Fax:  [phone]  Physical Therapy Treatment  Patient Details  Name: [name] MRN: [id number] Date of Birth: Jun 29, 1946 Referring Provider (PT): Reita May Date: 03/20/2019  PT End of Session - 03/20/19 1527    Visit Number  10    Number of Visits  14    Date for PT Re-Evaluation  04/17/19    PT Start Time  1532    PT Stop Time  1616    PT Time Calculation (min)  44 min       Past Medical History:  Diagnosis Date  . Allergy   . Alzheimer disease ([place]) 06/2018   diagnosed with early stage alzheimers  . Arthritis    osteoarthritis  . Cancer [facility])    testicular, age 86, orchiectomy 80  . Cataract    B/L CATARACTS NO SURGERY  . Dementia ([place])   . Depression   . Dyslipidemia   . Heartburn    TAKES ZANTAC  . History of radiation therapy 12/02/18- 01/14/19   Head and neck/ base of tongue. 60 Gy over 30 fractions.   . Hypertension   . Hypogonadism male   . Prostate cancer ([place]) 06/07/11   gleason 3+3=6, vol 59.5 cc  . Sleep apnea   . Status post chemotherapy    testicular cancer 1992, Dr Beryle Beams    Past Surgical History:  Procedure Laterality Date  . APPENDECTOMY    . COLONOSCOPY  2007   gessner  . DIRECT LARYNGOSCOPY N/A 08/19/2018   Procedure: DIRECT LARYNGOSCOPY;  Surgeon: Leta Baptist, MD;  Location: [facility];  Service: ENT;  Laterality: N/A;  . INGUINAL HERNIA REPAIR     w/orchiectomy 1992, retroperitoneal lymph node dissection  . IR GASTROSTOMY TUBE REMOVAL  02/28/2019  . MENISECTOMY     R&L knee surgeries  . PROSTATE SURGERY     biopsy x 2  . TONGUE BIOPSY N/A 08/19/2018   Procedure: BIOPSY OF TONGUE BASE MASS;  Surgeon: Leta Baptist, MD;  Location: [facility];  Service: ENT;  Laterality: N/A;    There were no vitals filed for this visit.  Subjective Assessment - 03/20/19 1433    Subjective  There was no evidence of cancer. I still have that pain back under my jaw below my tooth. Pt has been having neck tightness but reports he has not been doing his exercises.    Pertinent History  mid March 2020-TORS modified radical neck dissection for treatment of base of tongue and R tonsillar cancer, completed radiation in early June 2020, alzheimers disease, hx of testicular cancer, current prostate cancer    Patient Stated Goals  to not feel any discomfort in neck when eating    Currently in Pain?  No/denies    Pain Score  0-No pain                       [facility] Adult PT Treatment/Exercise - 03/20/19 0001      Manual Therapy   Manual Therapy  Manual Lymphatic Drainage (MLD)    Manual Lymphatic Drainage (MLD)  in supine: 5 diaphragmatic breaths, short neck, bilateral axillary nodes, bilateral pectoral nodes, short neck, lateral and posteriolateral pathways, anterior neck on right side in area of swelling moving fluid laterally towards pathway on R lateral neck then retraced all steps      Neck Exercises: Stretches  Other Neck Stretches  reviewed all of pts current head and neck stretches with him today and educated him on importance of compliance with these                  PT Long Term Goals - 03/20/19 1440      PT LONG TERM GOAL #1   Title  Pt will demonstrate improved neck extension to allow decreased discomfort    Baseline  50% limited, 03/06/19- 40% limited now;  ~20% limited at this time - 03/11/19    Time  4    Period  Weeks    Status  Achieved      PT LONG TERM GOAL #2   Title  Pt will report a 75% improvement in neck tightness and pain to allow improved comfort when eating    Baseline  Pt reports 80% improvement with neck tightness - 03/11/19    Time  4    Period  Weeks    Status  Achieved      PT LONG TERM GOAL #3   Title  Pt will be independent in a home exercise program for continued strengthening and stretching    Time  4    Period   Weeks    Status  Achieved      PT LONG TERM GOAL #4   Title  Pt will demonstrate a 50% reduction in swelling in area between 2 scars on right side of neck.    Time  4    Period  Weeks    Status  New    Target Date  04/17/19            Plan - 03/20/19 1528    Clinical Impression Statement  Pt was seen by his doctor regarding the swelling and pain in his right neck. There was no evidence of cancer. He is still having right neck tightness but pt has not been compliant with his exercises. Educated pt about importance of being compliant and that if he does not do his exercises his pain will not improve. Educated pt that at this point he needs to be doing his exercises daily for neck tightness and pain. Pt now has swelling that is prominent between 2 scars on right side of neck. His wife reports it has worsened in recent weeks. Began MLD today to address this. Will focus on decreasing swelling. Pt would benefit from therapy 1x/wk for 4 weeks to focus on decreasing swelling and progressing pt towards indep with managing this.    Comorbidities  Alzheimer's, current prostate cancer, hx of testicular cancer    Stability/Clinical Decision Making  Evolving/Moderate complexity    Rehab Potential  Good    PT Frequency  1x / week    PT Duration  4 weeks    PT Treatment/Interventions  ADLs/Self Care Home Management;Therapeutic activities;Therapeutic exercise;Patient/family education;Manual techniques;Passive range of motion;Scar mobilization;Taping    PT Next Visit Plan  focus on MLD to area of swelling on R neck that is located between 2 scars, possible chip pack, instruct pt in self MLD    PT Home Exercise Plan  head and neck ROM    Consulted and Agree with Plan of Care  Family member/caregiver;Patient    Family Member Consulted  wife       Patient will benefit from skilled therapeutic intervention in order to improve the following deficits and impairments:  Decreased range of motion, Decreased  scar mobility, Decreased strength, Increased fascial restricitons, Postural dysfunction,  Pain  Visit Diagnosis: 1. Localized edema   2. Cervicalgia   3. Disorder of the skin and subcutaneous tissue related to radiation, unspecified   4. Abnormal posture        Problem List Patient Active Problem List   Diagnosis Date Noted  . Malignant neoplasm of base of tongue ([place]) 09/27/2018  . 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 Aortic atherosclerosis ([place]) 08/08/2018  . History of testicular cancer 08/08/2018  . Lymphadenitis 07/26/2018  . Mild cognitive impairment 09/11/2016  . Essential tremor 09/11/2016  . Hyperreflexia 06/08/2016  . Family history of heart disease in male family member before age 65 08/31/2015  . Depression 10/19/2014  . OSA (obstructive sleep apnea) 08/24/2014  . Hypertension   . Heartburn   . Arthritis   . History of prostate cancer 06/07/2011  . Hyperlipidemia 12/28/2010  . BPH (benign prostatic hypertrophy) with urinary retention 12/28/2010  . Hypogonadism male 12/28/2010    Allyson Sabal [facility] 03/20/2019, 4:28 PM  [place], [place], [address] Phone: [phone]   Fax:  [phone]  Name: [name] MRN: [id number] Date of Birth: 1946-01-11  Manus Gunning, PT 03/20/19 4:28 PM

## 2019-03-27 ENCOUNTER — Other Ambulatory Visit: Payer: Self-pay | Admitting: Family Medicine

## 2019-03-27 ENCOUNTER — Encounter: Payer: Self-pay | Admitting: Physical Therapy

## 2019-03-27 ENCOUNTER — Ambulatory Visit: Payer: Medicare Other | Admitting: Physical Therapy

## 2019-03-27 ENCOUNTER — Other Ambulatory Visit: Payer: Self-pay

## 2019-03-27 DIAGNOSIS — M542 Cervicalgia: Secondary | ICD-10-CM | POA: Diagnosis not present

## 2019-03-27 DIAGNOSIS — E785 Hyperlipidemia, unspecified: Secondary | ICD-10-CM

## 2019-03-27 DIAGNOSIS — R600 Localized edema: Secondary | ICD-10-CM

## 2019-03-27 NOTE — Therapy (Signed)
[name], [place], [address] Phone: [phone]   Fax:  [phone]  Physical Therapy Treatment  Patient Details  Name: [name] MRN: [id number] Date of Birth: 06-15-1946 Referring Provider (PT): [name] Date: 03/27/2019  PT End of Session - 03/27/19 1354    Visit Number  11    Number of Visits  14    Date for PT Re-Evaluation  04/17/19    PT Start Time  1302    PT Stop Time  1345    PT Time Calculation (min)  43 min    Activity Tolerance  Patient tolerated treatment well    Behavior During Therapy  [name] for tasks assessed/performed       Past Medical History:  Diagnosis Date  . Allergy   . Alzheimer disease ([name]) 06/2018   diagnosed with early stage alzheimers  . Arthritis    osteoarthritis  . Cancer [name])    testicular, age 73, orchiectomy 70  . Cataract    B/L CATARACTS NO SURGERY  . Dementia ([name])   . Depression   . Dyslipidemia   . Heartburn    TAKES ZANTAC  . History of radiation therapy 12/02/18- 01/14/19   Head and neck/ base of tongue. 71 Gy over 30 fractions.   . Hypertension   . Hypogonadism male   . Prostate cancer ([name]) 06/07/11   gleason 3+3=6, vol 59.5 cc  . Sleep apnea   . Status post chemotherapy    testicular cancer 73, Dr [name]    Past Surgical History:  Procedure Laterality Date  . APPENDECTOMY    . COLONOSCOPY  2007   gessner  . DIRECT LARYNGOSCOPY N/A 08/19/2018   Procedure: DIRECT LARYNGOSCOPY;  Surgeon: Bruce Baptist, MD;  Location: [name];  Service: ENT;  Laterality: N/A;  . INGUINAL HERNIA REPAIR     w/orchiectomy 1992, retroperitoneal lymph node dissection  . IR GASTROSTOMY TUBE REMOVAL  02/28/2019  . MENISECTOMY     R&L knee surgeries  . PROSTATE SURGERY     biopsy x 2  . TONGUE BIOPSY N/A 08/19/2018   Procedure: BIOPSY OF TONGUE BASE MASS;  Surgeon: Bruce Baptist, MD;  Location: [facility];  Service:  ENT;  Laterality: N/A;    There were no vitals filed for this visit.  Subjective Assessment - 03/27/19 1303    Subjective  The neck is more swollen. I am still having pain in my mouth. It feels like a fire in my mouth when I am eating.    Pertinent History  mid March 2020-TORS modified radical neck dissection for treatment of base of tongue and R tonsillar cancer, completed radiation in early June 2020, alzheimers disease, hx of testicular cancer, current prostate cancer    Patient Stated Goals  to not feel any discomfort in neck when eating    Currently in Pain?  Yes    Pain Score  2     Pain Location  Mouth    Pain Orientation  Right                       [facility] Adult PT Treatment/Exercise - 03/27/19 0001      Manual Therapy   Manual Therapy  Manual Lymphatic Drainage (MLD);Edema management    Edema Management  measured pt for [name] head and neck garment and issued info for them to obtain on [employer]    Manual Lymphatic Drainage (  MLD)  in supine: 5 diaphragmatic breaths, short neck, bilateral axillary nodes, bilateral pectoral nodes, short neck, lateral and posteriolateral pathways, anterior neck on right side in area of swelling moving fluid laterally towards pathway on R lateral neck then retraced all steps                  PT Long Term Goals - 03/20/19 1440      PT LONG TERM GOAL #1   Title  Pt will demonstrate improved neck extension to allow decreased discomfort    Baseline  50% limited, 03/06/19- 40% limited now;  ~20% limited at this time - 03/11/19    Time  4    Period  Weeks    Status  Achieved      PT LONG TERM GOAL #2   Title  Pt will report a 75% improvement in neck tightness and pain to allow improved comfort when eating    Baseline  Pt reports 80% improvement with neck tightness - 03/11/19    Time  4    Period  Weeks    Status  Achieved      PT LONG TERM GOAL #3   Title  Pt will be independent in a home exercise program for continued  strengthening and stretching    Time  4    Period  Weeks    Status  Achieved      PT LONG TERM GOAL #4   Title  Pt will demonstrate a 50% reduction in swelling in area between 2 scars on right side of neck.    Time  4    Period  Weeks    Status  New    Target Date  04/17/19            Plan - 03/27/19 1355    Clinical Impression Statement  Pt is still having swelling on R side of neck. Continued with MLD to this area and measured pt for [name] head and neck garment. Once it arrives will add foam to garment to increase compression to area of sweling. Pt's swelling softened and become more pliable after MLD today. Educated pt about importance of compression garment for long term management of swelling.    PT Frequency  1x / week    PT Duration  4 weeks    PT Treatment/Interventions  ADLs/Self Care Home Management;Therapeutic activities;Therapeutic exercise;Patient/family education;Manual techniques;Passive range of motion;Scar mobilization;Taping    PT Next Visit Plan  assess garment for fit and add foam as needed, focus on MLD to area of swelling on R neck that is located between 2 scars, possible chip pack, instruct pt in self MLD    PT Home Exercise Plan  head and neck ROM    Consulted and Agree with Plan of Care  Patient;Family member/caregiver    Family Member Consulted  wife       Patient will benefit from skilled therapeutic intervention in order to improve the following deficits and impairments:  Decreased range of motion, Decreased scar mobility, Decreased strength, Increased fascial restricitons, Postural dysfunction, Pain  Visit Diagnosis: Localized edema     Problem List Patient Active Problem List   Diagnosis Date Noted  . Malignant neoplasm of base of tongue ([place]) 09/27/2018  . 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 Aortic atherosclerosis ([place]) 08/08/2018  . History of testicular cancer 08/08/2018  . Lymphadenitis 07/26/2018  . Mild  cognitive impairment 09/11/2016  . Essential tremor 09/11/2016  . Hyperreflexia 06/08/2016  .  Family history of heart disease in male family member before age 38 08/31/2015  . Depression 10/19/2014  . OSA (obstructive sleep apnea) 08/24/2014  . Hypertension   . Heartburn   . Arthritis   . History of prostate cancer 06/07/2011  . Hyperlipidemia 12/28/2010  . BPH (benign prostatic hypertrophy) with urinary retention 12/28/2010  . Hypogonadism male 12/28/2010    Allyson Sabal [facility] 03/27/2019, 1:57 PM  [place], [place], [address] Phone: [phone]   Fax:  [phone]  Name: [name] MRN: [id number] Date of Birth: 1946-02-04  Manus Gunning, PT 03/27/19 1:57 PM

## 2019-04-01 ENCOUNTER — Telehealth: Payer: Self-pay | Admitting: *Deleted

## 2019-04-01 NOTE — Telephone Encounter (Signed)
CALLED PATIENT TO INFORM OF STAT LABS ON 04-10-19 @ 2:15 PM @ [place] AND HIS CT ON 04-10-19 - ARRIVAL TIME - 2:45 PM @ [facility], PT. TO BE NPO- 4 HRS. PRIOR TO TEST, PATIENT TO RECEIVE RESULTS FROM DR. SQUIRE ON 04-11-19, LVM FOR A RETURN CALL

## 2019-04-03 ENCOUNTER — Encounter: Payer: Self-pay | Admitting: Physical Therapy

## 2019-04-03 ENCOUNTER — Ambulatory Visit: Payer: Medicare Other | Admitting: Physical Therapy

## 2019-04-03 ENCOUNTER — Other Ambulatory Visit: Payer: Self-pay

## 2019-04-03 DIAGNOSIS — R600 Localized edema: Secondary | ICD-10-CM

## 2019-04-03 DIAGNOSIS — M542 Cervicalgia: Secondary | ICD-10-CM | POA: Diagnosis not present

## 2019-04-03 NOTE — Therapy (Signed)
[place], [place], [address] Phone: [phone]   Fax:  [phone]  Physical Therapy Treatment  Patient Details  Name: [name] MRN: [id number] Date of Birth: 1945-12-18 Referring Provider (PT): [name] Date: 04/03/2019  PT End of Session - 04/03/19 1353    Visit Number  12    Number of Visits  14    Date for PT Re-Evaluation  04/17/19    PT Start Time  [id number]    PT Stop Time  1346    PT Time Calculation (min)  41 min    Activity Tolerance  Patient tolerated treatment well    Behavior During Therapy  [name] for tasks assessed/performed       Past Medical History:  Diagnosis Date  . Allergy   . Alzheimer disease ([place]) 06/2018   diagnosed with early stage alzheimers  . Arthritis    osteoarthritis  . Cancer [facility])    testicular, age 73, orchiectomy 32  . Cataract    B/L CATARACTS NO SURGERY  . Dementia ([place])   . Depression   . Dyslipidemia   . Heartburn    TAKES ZANTAC  . History of radiation therapy 12/02/18- 01/14/19   Head and neck/ base of tongue. 60 Gy over 30 fractions.   . Hypertension   . Hypogonadism male   . Prostate cancer ([place]) 06/07/11   gleason 3+3=6, vol 59.5 cc  . Sleep apnea   . Status post chemotherapy    testicular cancer 1992, Dr Beryle Beams    Past Surgical History:  Procedure Laterality Date  . APPENDECTOMY    . COLONOSCOPY  2007   gessner  . DIRECT LARYNGOSCOPY N/A 08/19/2018   Procedure: DIRECT LARYNGOSCOPY;  Surgeon: Leta Baptist, MD;  Location: [place];  Service: ENT;  Laterality: N/A;  . INGUINAL HERNIA REPAIR     w/orchiectomy 1992, retroperitoneal lymph node dissection  . IR GASTROSTOMY TUBE REMOVAL  02/28/2019  . MENISECTOMY     R&L knee surgeries  . PROSTATE SURGERY     biopsy x 2  . TONGUE BIOPSY N/A 08/19/2018   Procedure: BIOPSY OF TONGUE BASE MASS;  Surgeon: Leta Baptist, MD;  Location: [place];  Service:  ENT;  Laterality: N/A;    There were no vitals filed for this visit.  Subjective Assessment - 04/03/19 1306    Subjective  I got the garment. The swelling has gotten worse.    Pertinent History  mid March 2020-TORS modified radical neck dissection for treatment of base of tongue and R tonsillar cancer, completed radiation in early June 2020, alzheimers disease, hx of testicular cancer, current prostate cancer    Patient Stated Goals  to not feel any discomfort in neck when eating    Currently in Pain?  No/denies    Pain Score  0-No pain                       [facility] Adult PT Treatment/Exercise - 04/03/19 0001      Manual Therapy   Edema Management  assessed [name] for fit today and added 1 piece of 1/2 inch grey foam to area of swelling and instructed pt and [name] where to place it in [place] to help decrease swelling    Manual Lymphatic Drainage (MLD)  in supine: 5 diaphragmatic breaths, short neck, bilateral axillary nodes, bilateral pectoral nodes, short neck, lateral and posteriolateral pathways, anterior neck on right  side in area of swelling moving fluid laterally towards pathway on R lateral neck then retraced all steps                  PT Long Term Goals - 03/20/19 1440      PT LONG TERM GOAL #1   Title  Pt will demonstrate improved neck extension to allow decreased discomfort    Baseline  50% limited, 03/06/19- 40% limited now;  ~20% limited at this time - 03/11/19    Time  4    Period  Weeks    Status  Achieved      PT LONG TERM GOAL #2   Title  Pt will report a 75% improvement in neck tightness and pain to allow improved comfort when eating    Baseline  Pt reports 80% improvement with neck tightness - 03/11/19    Time  4    Period  Weeks    Status  Achieved      PT LONG TERM GOAL #3   Title  Pt will be independent in a home exercise program for continued strengthening and stretching    Time  4    Period  Weeks    Status  Achieved      PT LONG  TERM GOAL #4   Title  Pt will demonstrate a 50% reduction in swelling in area between 2 scars on right side of neck.    Time  4    Period  Weeks    Status  New    Target Date  04/17/19            Plan - 04/03/19 1353    Clinical Impression Statement  Pt demonstrates more swelling on right side of neck today. 73 He received his head and neck garment and therapist cut 1/2 inch piece of grey foam and placed that under [name] in area of swelling. Educated pt and spouse to wear garment as much as possible, at least 4 hours per day. Have not instructed pt or [name] in self MLD due to pt's Alzheimer's he will be unable to do himself and his [name] states she has a hard time just applying lotion to his neck. Will see if garment can manage swelling at this point.    Stability/Clinical Decision Making  Evolving/Moderate complexity    Rehab Potential  Good    PT Frequency  1x / week    PT Duration  4 weeks    PT Treatment/Interventions  ADLs/Self Care Home Management;Therapeutic activities;Therapeutic exercise;Patient/family education;Manual techniques;Passive range of motion;Scar mobilization;Taping    PT Next Visit Plan  assess swelling and see how foam worked and how compliant pt was with wearing garment, focus on MLD to area of swelling on R neck that is located between 2 scars, possible chip pack, instruct pt in self MLD    PT Home Exercise Plan  head and neck ROM    Consulted and Agree with Plan of Care  Patient;Family member/caregiver    Family Member Consulted  [name]       Patient will benefit from skilled therapeutic intervention in order to improve the following deficits and impairments:  Decreased range of motion, Decreased scar mobility, Decreased strength, Increased fascial restricitons, Postural dysfunction, Pain  Visit Diagnosis: Localized edema     Problem List Patient Active Problem List   Diagnosis Date Noted  . 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  Aortic atherosclerosis ([place]) 08/08/2018  . History of testicular cancer 08/08/2018  . Lymphadenitis 07/26/2018  . Mild cognitive impairment 09/11/2016  . Essential tremor 09/11/2016  . Hyperreflexia 06/08/2016  . Family history of heart disease in male family member before age 93 08/31/2015  . Depression 10/19/2014  . OSA (obstructive sleep apnea) 08/24/2014  . Hypertension   . Heartburn   . Arthritis   . History of prostate cancer 06/07/2011  . Hyperlipidemia 12/28/2010  . BPH (benign prostatic hypertrophy) with urinary retention 12/28/2010  . Hypogonadism male 12/28/2010    Allyson Sabal [facility] 04/03/2019, 1:56 PM  [place], [place], [address] Phone: [phone]   Fax:  [phone]  Name: TAHMID DILONE MRN: [id number] Date of Birth: April 20, 1946  Manus Gunning, PT 04/03/19 1:56 PM

## 2019-04-08 NOTE — Progress Notes (Signed)
Mr. Slemmer presents for follow up of radiation completed to his base of tongue.   Pain issues, if any: He denies.  Using a feeding tube?: removed 02/28/19 Weight changes, if any:  Wt Readings from Last 3 Encounters:  04/11/19 139 lb 2 oz (63.1 kg)  02/12/19 130 lb (59 kg)  02/05/19 132 lb 12.8 oz (60.2 kg)   Swallowing issues, if any: He denies difficulty swallowing. He does avoid extremely chewy foods.  Smoking or chewing tobacco? No Using fluoride trays daily? Yes daily.  Last ENT visit was on: Dr. Benjamine Mola about 2-3 weeks ago.  Other notable issues, if any:  Here for results of CT neck/chest completed 04/10/19.  BP 128/80 (BP Location: Left Arm, Patient Position: Sitting)   Pulse 75   Temp 98.7 F (37.1 C)   Resp 18   Ht 5\' 7"  (1.702 m)   Wt 139 lb 2 oz (63.1 kg)   SpO2 99%   BMI 21.79 kg/m

## 2019-04-10 ENCOUNTER — Other Ambulatory Visit: Payer: Self-pay

## 2019-04-10 ENCOUNTER — Ambulatory Visit: Payer: Medicare Other | Attending: Radiation Oncology | Admitting: Physical Therapy

## 2019-04-10 ENCOUNTER — Encounter: Payer: Self-pay | Admitting: Physical Therapy

## 2019-04-10 ENCOUNTER — Ambulatory Visit (HOSPITAL_COMMUNITY)
Admission: RE | Admit: 2019-04-10 | Discharge: 2019-04-10 | Disposition: A | Discharge status: Home / Self Care | Payer: Medicare Other | Source: Ambulatory Visit | Attending: Radiation Oncology | Admitting: Radiation Oncology

## 2019-04-10 ENCOUNTER — Ambulatory Visit
Admission: RE | Admit: 2019-04-10 | Discharge: 2019-04-10 | Disposition: A | Discharge status: Home / Self Care | Payer: Medicare Other | Source: Ambulatory Visit | Attending: Radiation Oncology | Admitting: Radiation Oncology

## 2019-04-10 DIAGNOSIS — C01 Malignant neoplasm of base of tongue: Secondary | ICD-10-CM | POA: Diagnosis not present

## 2019-04-10 DIAGNOSIS — R634 Abnormal weight loss: Secondary | ICD-10-CM

## 2019-04-10 DIAGNOSIS — R600 Localized edema: Secondary | ICD-10-CM | POA: Insufficient documentation

## 2019-04-10 DIAGNOSIS — M542 Cervicalgia: Secondary | ICD-10-CM | POA: Insufficient documentation

## 2019-04-10 DIAGNOSIS — Z510 Encounter for antineoplastic radiation therapy: Secondary | ICD-10-CM | POA: Diagnosis not present

## 2019-04-10 LAB — BUN & CREATININE (CHCC)
BUN: 17 mg/dL (ref 8–23)
Creatinine: 0.81 mg/dL (ref 0.61–1.24)
GFR, Est AFR Am: >60 mL/min (ref >60)
GFR, Estimated: >60 mL/min (ref >60)

## 2019-04-10 MED ORDER — IOHEXOL 300 MG/ML  SOLN
100.0000 mL | Freq: Once | INTRAMUSCULAR | Status: AC | PRN
  Administered 2019-04-10: 75 mL via INTRAVENOUS

## 2019-04-10 MED ORDER — SODIUM CHLORIDE (PF) 0.9 % IJ SOLN
INTRAMUSCULAR | Status: AC
  Filled 2019-04-10: qty 50

## 2019-04-10 NOTE — Therapy (Signed)
[place], [place], [address] Phone: [phone]   Fax:  [phone]  Physical Therapy Treatment  Patient Details  Name: [name] MRN: [id number] Date of Birth: 1945/10/20 Referring Provider (PT): Reita May Date: 04/10/2019  PT End of Session - 04/10/19 1348    Visit Number  13    Number of Visits  14    Date for PT Re-Evaluation  04/17/19    PT Start Time  [id number]    PT Stop Time  1345    PT Time Calculation (min)  42 min    Activity Tolerance  Patient tolerated treatment well    Behavior During Therapy  [facility] for tasks assessed/performed       Past Medical History:  Diagnosis Date  . Allergy   . Alzheimer disease ([place]) 06/2018   diagnosed with early stage alzheimers  . Arthritis    osteoarthritis  . Cancer [facility])    testicular, age 45, orchiectomy 89  . Cataract    B/L CATARACTS NO SURGERY  . Dementia ([place])   . Depression   . Dyslipidemia   . Heartburn    TAKES ZANTAC  . History of radiation therapy 12/02/18- 01/14/19   Head and neck/ base of tongue. 60 Gy over 30 fractions.   . Hypertension   . Hypogonadism male   . Prostate cancer ([place]) 06/07/11   gleason 3+3=6, vol 59.5 cc  . Sleep apnea   . Status post chemotherapy    testicular cancer 1992, Dr Beryle Beams    Past Surgical History:  Procedure Laterality Date  . APPENDECTOMY    . COLONOSCOPY  2007   gessner  . DIRECT LARYNGOSCOPY N/A 08/19/2018   Procedure: DIRECT LARYNGOSCOPY;  Surgeon: Leta Baptist, MD;  Location: [place];  Service: ENT;  Laterality: N/A;  . INGUINAL HERNIA REPAIR     w/orchiectomy 1992, retroperitoneal lymph node dissection  . IR GASTROSTOMY TUBE REMOVAL  02/28/2019  . MENISECTOMY     R&L knee surgeries  . PROSTATE SURGERY     biopsy x 2  . TONGUE BIOPSY N/A 08/19/2018   Procedure: BIOPSY OF TONGUE BASE MASS;  Surgeon: Leta Baptist, MD;  Location: [place];  Service:  ENT;  Laterality: N/A;    There were no vitals filed for this visit.  Subjective Assessment - 04/10/19 1305    Subjective  I have been wearing the garment and it seems like it is getting softer.    Pertinent History  mid March 2020-TORS modified radical neck dissection for treatment of base of tongue and R tonsillar cancer, completed radiation in early June 2020, alzheimers disease, hx of testicular cancer, current prostate cancer    Patient Stated Goals  to not feel any discomfort in neck when eating    Currently in Pain?  Yes    Pain Score  3     Pain Location  Throat    Pain Orientation  Anterior    Pain Descriptors / Indicators  Discomfort    Pain Type  Acute pain                       [facility] Adult PT Treatment/Exercise - 04/10/19 0001      Manual Therapy   Edema Management  educated pt to wear compression garment at least 4 hours a day    Manual Lymphatic Drainage (MLD)  in supine: 5 diaphragmatic breaths, short neck, bilateral  axillary nodes, bilateral pectoral nodes, short neck, lateral and posteriolateral pathways, anterior neck on right side in area of swelling moving fluid laterally towards pathway on R lateral neck then retraced all steps                  PT Long Term Goals - 03/20/19 1440      PT LONG TERM GOAL #1   Title  Pt will demonstrate improved neck extension to allow decreased discomfort    Baseline  50% limited, 03/06/19- 40% limited now;  ~20% limited at this time - 03/11/19    Time  4    Period  Weeks    Status  Achieved      PT LONG TERM GOAL #2   Title  Pt will report a 75% improvement in neck tightness and pain to allow improved comfort when eating    Baseline  Pt reports 80% improvement with neck tightness - 03/11/19    Time  4    Period  Weeks    Status  Achieved      PT LONG TERM GOAL #3   Title  Pt will be independent in a home exercise program for continued strengthening and stretching    Time  4    Period  Weeks     Status  Achieved      PT LONG TERM GOAL #4   Title  Pt will demonstrate a 50% reduction in swelling in area between 2 scars on right side of neck.    Time  4    Period  Weeks    Status  New    Target Date  04/17/19            Plan - 04/10/19 1348    Clinical Impression Statement  Pt has been wearing his compression garment with foam intact over area of swelling for a few hours almost every night. The area of swelling is much softer today and appears less full. After MLD it was noticeably smaller and responded better to MLD than previously.    Stability/Clinical Decision Making  Evolving/Moderate complexity    Rehab Potential  Good    PT Frequency  1x / week    PT Duration  4 weeks    PT Treatment/Interventions  ADLs/Self Care Home Management;Therapeutic activities;Therapeutic exercise;Patient/family education;Manual techniques;Passive range of motion;Scar mobilization;Taping    PT Next Visit Plan  assess swelling and see how foam worked and how compliant pt was with wearing garment, focus on MLD to area of swelling on R neck that is located between 2 scars, possible chip pack, instruct pt in self MLD    PT Home Exercise Plan  head and neck ROM    Consulted and Agree with Plan of Care  Patient       Patient will benefit from skilled therapeutic intervention in order to improve the following deficits and impairments:  Decreased range of motion, Decreased scar mobility, Decreased strength, Increased fascial restricitons, Postural dysfunction, Pain  Visit Diagnosis: Localized edema     Problem List Patient Active Problem List   Diagnosis Date Noted  . 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 Squamous cell carcinoma of right tonsil ([place]) 09/16/2018  . Aortic atherosclerosis ([place]) 08/08/2018  . History of testicular cancer 08/08/2018  . Lymphadenitis 07/26/2018  . Mild cognitive impairment 09/11/2016  . Essential tremor 09/11/2016  . Hyperreflexia 06/08/2016   . Family history of heart disease in male family member before age 40  08/31/2015  . Depression 10/19/2014  . OSA (obstructive sleep apnea) 08/24/2014  . Hypertension   . Heartburn   . Arthritis   . History of prostate cancer 06/07/2011  . Hyperlipidemia 12/28/2010  . BPH (benign prostatic hypertrophy) with urinary retention 12/28/2010  . Hypogonadism male 12/28/2010    Allyson Sabal [facility] 04/10/2019, 1:50 PM  [place], [place], [address] Phone: [phone]   Fax:  [phone]  Name: [name] MRN: [id number] Date of Birth: Jun 06, 1946  Manus Gunning, PT 04/10/19 1:50 PM

## 2019-04-11 ENCOUNTER — Encounter: Payer: Self-pay | Admitting: Radiation Oncology

## 2019-04-11 ENCOUNTER — Ambulatory Visit
Admission: RE | Admit: 2019-04-11 | Discharge: 2019-04-11 | Disposition: A | Discharge status: Home / Self Care | Payer: Medicare Other | Source: Ambulatory Visit | Attending: Radiation Oncology | Admitting: Radiation Oncology

## 2019-04-11 DIAGNOSIS — Z85810 Personal history of malignant neoplasm of tongue: Secondary | ICD-10-CM | POA: Insufficient documentation

## 2019-04-11 DIAGNOSIS — I700 Atherosclerosis of aorta: Secondary | ICD-10-CM | POA: Diagnosis not present

## 2019-04-11 DIAGNOSIS — J439 Emphysema, unspecified: Secondary | ICD-10-CM | POA: Diagnosis not present

## 2019-04-11 DIAGNOSIS — Z79899 Other long term (current) drug therapy: Secondary | ICD-10-CM | POA: Diagnosis not present

## 2019-04-11 DIAGNOSIS — Z923 Personal history of irradiation: Secondary | ICD-10-CM | POA: Diagnosis not present

## 2019-04-11 DIAGNOSIS — R918 Other nonspecific abnormal finding of lung field: Secondary | ICD-10-CM | POA: Diagnosis not present

## 2019-04-11 DIAGNOSIS — C01 Malignant neoplasm of base of tongue: Secondary | ICD-10-CM

## 2019-04-11 LAB — TSH: TSH: 3.725 u[IU]/mL (ref 0.320–4.118)

## 2019-04-11 NOTE — Progress Notes (Signed)
Radiation Oncology         [phone] ________________________________  Name: [name] MRN: [id number]  Date: 04/11/2019  DOB: Aug 13, 1945  Follow-Up Visit Note  CC: Denita Lung, MD  Leta Baptist, MD  Diagnosis and Prior Radiotherapy:       ICD-10-CM   1. Malignant neoplasm of base of tongue [facility])  C01      Radiation Treatment Dates: 12/02/2018 through 01/14/2019 Site Technique Total Dose Dose per Fx Completed Fx Beam Energies  Head & neck: HN_BOT IMRT 60/60 2 30/30 6X   CHIEF COMPLAINT:  Here for follow-up and surveillance of throat cancer  Narrative:  The patient returns today for routine follow-up and to review recent imaging. He had his feeding tube removed on 02/28/2019.  He underwent restaging chest and neck CT yesterday, 04/10/2019, which were stable with no evidence of recurrence or metastasis.  I personally reviewed his imaging prior to his visit.  Pain issues, if any: He denies.  Using a feeding tube?: removed 02/28/19 Weight changes, if any:  Wt Readings from Last 3 Encounters:  04/11/19 139 lb 2 oz (63.1 kg)  02/12/19 130 lb (59 kg)  02/05/19 132 lb 12.8 oz (60.2 kg)   Swallowing issues, if any: He denies difficulty swallowing. He does avoid extremely chewy foods.  Smoking or chewing tobacco? No Using fluoride trays daily? Yes daily.  Last ENT visit was on: Dr. Benjamine Mola about 2-3 weeks ago.   His wife is on speaker phone with [place] today.  They both feel like he is doing well symptomatically.  ALLERGIES:  has No Known Allergies.  Meds: Current Outpatient Medications  Medication Sig Dispense Refill   donepezil (ARICEPT) 10 MG tablet Take 1 tablet (10 mg total) by mouth at bedtime. 90 tablet 2   lidocaine (XYLOCAINE) 2 % solution Patient: Mix 1part 2% viscous lidocaine, 1part H20. Swish & swallow 35mL of diluted mixture, 20min before meals and at bedtime, up to QID 100 mL 5   mirtazapine (REMERON SOLTAB) 30 MG disintegrating tablet Take 1 tablet (30 mg total) by  mouth at bedtime. 30 tablet 8   polyethylene glycol powder (GLYCOLAX/MIRALAX) powder Take 17 g by mouth daily. 3350 g 11   primidone (MYSOLINE) 50 MG tablet Take 1 tablet (50 mg total) by mouth 2 (two) times daily. 180 tablet 2   rosuvastatin (CRESTOR) 20 MG tablet TAKE 1 TABLET(20 MG) BY MOUTH DAILY 90 tablet 0   sodium fluoride (PREVIDENT 5000 PLUS) 1.1 % CREA dental cream Apply cream to tooth brush. Brush teeth for 2 minutes. Spit out excess. DO NOT rinse afterwards. Repeat nightly. 1 Tube prn   terazosin (HYTRIN) 2 MG capsule TAKE 1 CAPSULE(2 MG) BY MOUTH DAILY 90 capsule 1   triamcinolone (NASACORT ALLERGY 24HR) 55 MCG/ACT AERO nasal inhaler Place 1 spray into the nose daily.     vortioxetine HBr (TRINTELLIX) 20 MG TABS tablet Take 1 tablet (20 mg total) by mouth daily. 90 tablet 1   HYDROcodone-acetaminophen (NORCO/VICODIN) 5-325 MG tablet Take 1 tablet by mouth every 6 (six) hours as needed for moderate pain. For up to 5 days     ondansetron (ZOFRAN-ODT) 4 MG disintegrating tablet Take 4 mg by mouth every 8 (eight) hours as needed for nausea.     No current facility-administered medications for this encounter.     Physical Findings: The patient is in no acute distress. Patient is alert and oriented. Wt Readings from Last 3 Encounters:  04/11/19 139 lb 2  oz (63.1 kg)  02/12/19 130 lb (59 kg)  02/05/19 132 lb 12.8 oz (60.2 kg)    height is 5\' 7"  (1.702 m) and weight is 139 lb 2 oz (63.1 kg). His temperature is 98.7 F (37.1 C). His blood pressure is 128/80 and his pulse is 75. His respiration is 18 and oxygen saturation is 99%. .  General: Alert and oriented, in no acute distress HEENT: Head is normocephalic.  Oropharynx is notable for clear mucosa, no tumor  Neck: Neck is notable for no palpable masses Skin: Skin in treatment fields shows satisfactory healing  Lymphatics: see Neck Exam Psychiatric: Affect is appropriate.   Lab Findings: Lab Results  Component Value  Date   WBC 3.5 (L) 09/19/2018   HGB 13.0 09/19/2018   HCT 38.8 (L) 09/19/2018   MCV 93.5 09/19/2018   PLT 199 09/19/2018    Lab Results  Component Value Date   TSH 3.725 04/10/2019    Radiographic Findings: Ct Soft Tissue Neck W Contrast  Result Date: 04/11/2019 CLINICAL DATA:  Throat cancer follow-up post treatment EXAM: CT NECK WITH CONTRAST TECHNIQUE: Multidetector CT imaging of the neck was performed using the standard protocol following the bolus administration of intravenous contrast. CONTRAST:  62mL OMNIPAQUE IOHEXOL 300 MG/ML  SOLN COMPARISON:  CT neck 08/06/2018 FINDINGS: Pharynx and larynx: Regression of mass in the right tongue base which may have been resected or treated with radiation. No recurrent pharyngeal mass. Normal airway. Mild thickening of the pre epiglottic fat consistent with radiation change. Salivary glands: Surgical clips around the right submandibular gland which appears resected. Left submandibular gland normal. Parotid normal bilaterally. Thyroid: Negative Lymph nodes: Surgical clips in the right neck in the region of the submandibular gland. Previously noted necrotic lymph nodes on the right are no longer present. No enlarged or suspicious lymph nodes are present bilaterally. Vascular: Normal vascular enhancement. Mild atherosclerotic disease carotid bifurcation bilaterally. 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 No enhancing mass. Visualized orbits: Negative Mastoids and visualized paranasal sinuses: Negative Skeleton: Moderate to advanced cervical spondylosis. No acute skeletal abnormality. Upper chest: Chest CT reported separately from today Other: None IMPRESSION: Right tongue base tumor no longer present. Necrotic lymph nodes on the right no longer visualized. No recurrent adenopathy. Apparent resection of right submandibular gland and right level 2 lymph nodes. Electronically Signed   By: Franchot Gallo M.D.   On:  04/11/2019 10:36   Ct Chest W Contrast  Result Date: 04/10/2019 CLINICAL DATA:  Head and neck cancer.  Restaging. EXAM: CT CHEST WITH CONTRAST TECHNIQUE: Multidetector CT imaging of the chest was performed during intravenous contrast administration. CONTRAST:  48mL OMNIPAQUE IOHEXOL 300 MG/ML  SOLN COMPARISON:  CT chest 08/09/18 and PET-CT from 09/16/2018. FINDINGS: Cardiovascular: Normal heart size. No pericardial effusion identified. Aortic atherosclerosis. Mediastinum/Nodes: Normal appearance of the thyroid gland. The trachea appears patent and is midline. Normal appearance of the esophagus. No mediastinal or hilar adenopathy. Lungs/Pleura: Advanced changes of centrilobular and paraseptal emphysema. Peripheral subpleural nodule in the anterolateral left upper lobe is unchanged measuring 6 mm, image 67/5. No new pulmonary nodule or mass identified. Right lower lobe scarring. Upper Abdomen: No acute findings within the upper abdomen. Unchanged low-attenuation structure in posterior right lobe of liver measuring 9 mm, image 149/2. Left upper pole kidney cyst appears similar. Musculoskeletal: Multi level degenerative disc disease identified. No aggressive lytic or sclerotic bone lesion identified. IMPRESSION: 1. Stable exam. No specific findings to suggest  metastatic disease to the abdomen or pelvis. 2. Unchanged 6 mm subpleural nodule in the anterolateral left upper lobe. 3. Aortic Atherosclerosis (ICD10-I70.0) and Emphysema (ICD10-J43.9). Electronically Signed   By: Kerby Moors M.D.   On: 04/10/2019 17:01    Impression/Plan:    1) Head and Neck Cancer Status: Healing well from radiotherapy, no evidence of disease  2) Nutritional Status: No issues, PEG removed  3) Risk Factors: The patient has been educated about risk factors including alcohol and tobacco abuse; they understand that avoidance of alcohol and tobacco is important to prevent recurrences as well as other cancers.  4) Swallowing: Good  function, continue swallowing exercises per [facility] speech-language pathology   5) Dental: Encouraged to continue regular followup with dentistry, and dental hygiene including fluoride treatment  6) Thyroid function: Within normal limits Lab Results  Component Value Date   TSH 3.725 04/10/2019    7) Other: We will set follow-up up with Dr. Nicolette Bang of [facility]  8) Follow-up in March. The patient was encouraged to call with any issues or questions before then.   _____________________________________   Eppie Gibson, MD  This document serves as a record of services personally performed by Eppie Gibson, MD. It was created on her behalf by Wilburn Mylar, a trained medical scribe. The creation of this record is based on the scribe's personal observations and the provider's statements to them. This document has been checked and approved by the attending provider.

## 2019-04-17 ENCOUNTER — Telehealth: Payer: Self-pay | Admitting: *Deleted

## 2019-04-17 NOTE — Telephone Encounter (Addendum)
Oncology Nurse Navigator Documentation  Per patient's 9/4 post-treatment follow-up with Dr. Isidore Moos, sent e-mail to Johna Sheriff, scheduler for Dr. Nicolette Bang.  Requested routine follow-up appointment in 3 months.  E-mail 'Read Receipt' rec'd [phone].  Addendum 1600:  Rec'd e-mail from [facility] indicating appt scheduled for 07/16/2019.  [name], RN, BSN Head & Neck Oncology Nurse [place] at [place] [phone]

## 2019-04-22 ENCOUNTER — Ambulatory Visit: Payer: Medicare Other | Admitting: Physical Therapy

## 2019-04-22 ENCOUNTER — Other Ambulatory Visit: Payer: Self-pay

## 2019-04-22 ENCOUNTER — Encounter: Payer: Self-pay | Admitting: Physical Therapy

## 2019-04-22 DIAGNOSIS — R600 Localized edema: Secondary | ICD-10-CM | POA: Diagnosis not present

## 2019-04-22 DIAGNOSIS — M542 Cervicalgia: Secondary | ICD-10-CM

## 2019-04-22 NOTE — Therapy (Signed)
[place], [place], [address] Phone: [phone]   Fax:  [phone]  Physical Therapy Treatment  Patient Details  Name: [name] MRN: [id number] Date of Birth: 1946-02-19 Referring Provider (PT): Reita May Date: 04/22/2019  PT End of Session - 04/22/19 1455    Visit Number  14    Number of Visits  14    Date for PT Re-Evaluation  04/17/19    PT Start Time  1400    PT Stop Time  1447    PT Time Calculation (min)  47 min    Activity Tolerance  Patient tolerated treatment well    Behavior During Therapy  [facility] for tasks assessed/performed       Past Medical History:  Diagnosis Date  . Allergy   . Alzheimer disease ([place]) 06/2018   diagnosed with early stage alzheimers  . Arthritis    osteoarthritis  . Cancer [facility])    testicular, age 89, orchiectomy 79  . Cataract    B/L CATARACTS NO SURGERY  . Dementia ([place])   . Depression   . Dyslipidemia   . Heartburn    TAKES ZANTAC  . History of radiation therapy 12/02/18- 01/14/19   Head and neck/ base of tongue. 60 Gy over 30 fractions.   . Hypertension   . Hypogonadism male   . Prostate cancer ([place]) 06/07/11   gleason 3+3=6, vol 59.5 cc  . Sleep apnea   . Status post chemotherapy    testicular cancer 1992, Dr Beryle Beams    Past Surgical History:  Procedure Laterality Date  . APPENDECTOMY    . COLONOSCOPY  2007   gessner  . DIRECT LARYNGOSCOPY N/A 08/19/2018   Procedure: DIRECT LARYNGOSCOPY;  Surgeon: Leta Baptist, MD;  Location: [place];  Service: ENT;  Laterality: N/A;  . INGUINAL HERNIA REPAIR     w/orchiectomy 1992, retroperitoneal lymph node dissection  . IR GASTROSTOMY TUBE REMOVAL  02/28/2019  . MENISECTOMY     R&L knee surgeries  . PROSTATE SURGERY     biopsy x 2  . TONGUE BIOPSY N/A 08/19/2018   Procedure: BIOPSY OF TONGUE BASE MASS;  Surgeon: Leta Baptist, MD;  Location: [place];  Service:  ENT;  Laterality: N/A;    There were no vitals filed for this visit.  Subjective Assessment - 04/22/19 1401    Subjective  Pt reports he is doing the stretches every other day. Pt is wearing the compression garment but not every day. The swelling is more controlled.    Pertinent History  mid March 2020-TORS modified radical neck dissection for treatment of base of tongue and R tonsillar cancer, completed radiation in early June 2020, alzheimers disease, hx of testicular cancer, current prostate cancer    Patient Stated Goals  to not feel any discomfort in neck when eating    Currently in Pain?  No/denies    Pain Score  0-No pain                       [facility] Adult PT Treatment/Exercise - 04/22/19 0001      Manual Therapy   Edema Management  educated pt to continue with stretches at home and hold for 30 sec at least to avoid increased neck tightness and spasms    Soft tissue mobilization  to area just under right mandible where pt has increased tightness and recently had a spasm  Manual Lymphatic Drainage (MLD)  in supine: 5 diaphragmatic breaths, short neck, bilateral axillary nodes, bilateral pectoral nodes, short neck, lateral and posteriolateral pathways, anterior neck on right side in area of swelling moving fluid laterally towards pathway on R lateral neck then retraced all steps                  PT Long Term Goals - 04/22/19 1455      PT LONG TERM GOAL #1   Title  Pt will demonstrate improved neck extension to allow decreased discomfort    Baseline  50% limited, 03/06/19- 40% limited now;  ~20% limited at this time - 03/11/19    Status  Achieved      PT LONG TERM GOAL #2   Title  Pt will report a 75% improvement in neck tightness and pain to allow improved comfort when eating    Baseline  Pt reports 80% improvement with neck tightness - 03/11/19    Status  Achieved      PT LONG TERM GOAL #3   Title  Pt will be independent in a home exercise program for  continued strengthening and stretching    Status  Achieved      PT LONG TERM GOAL #4   Title  Pt will demonstrate a 50% reduction in swelling in area between 2 scars on right side of neck.    Baseline  04/22/19- pt reports he has had more than a 50% reduction in swelling    Status  Achieved            Plan - 04/22/19 1456    Clinical Impression Statement  Pt has now met all goals for therapy and is ready for discharge at this time. He has been managing his lymphedema with use of a compression garment that he is usually compliant with. He did have a muscle spasm just inferior to right mandible on way to therapy but this is the first time it has happend. Palpation reveals increased tightness in this area and STM helped improve this. Pt has not been compliant with his stretches and was educated on importance of compliance with stretches.    Rehab Potential  Good    PT Frequency  1x / week    PT Duration  4 weeks    PT Treatment/Interventions  ADLs/Self Care Home Management;Therapeutic activities;Therapeutic exercise;Patient/family education;Manual techniques;Passive range of motion;Scar mobilization;Taping    PT Next Visit Plan  dc this visit    PT Home Exercise Plan  head and neck ROM, wear compression garments    Consulted and Agree with Plan of Care  Patient    Family Member Consulted  wife       Patient will benefit from skilled therapeutic intervention in order to improve the following deficits and impairments:  Decreased range of motion, Decreased scar mobility, Decreased strength, Increased fascial restricitons, Postural dysfunction, Pain  Visit Diagnosis: Localized edema  Cervicalgia     Problem List Patient Active Problem List   Diagnosis Date Noted  . 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 Aortic atherosclerosis ([place]) 08/08/2018  . History of testicular cancer 08/08/2018  . Lymphadenitis  07/26/2018  . Mild cognitive impairment 09/11/2016  . Essential tremor 09/11/2016  . Hyperreflexia 06/08/2016  . Family history of heart disease in male family member before age 58 08/31/2015  . Depression 10/19/2014  . OSA (obstructive sleep apnea) 08/24/2014  . Hypertension   .  Heartburn   . Arthritis   . History of prostate cancer 06/07/2011  . Hyperlipidemia 12/28/2010  . BPH (benign prostatic hypertrophy) with urinary retention 12/28/2010  . Hypogonadism male 12/28/2010    Allyson Sabal [facility] 04/22/2019, 2:58 PM  [place], [place], [address] Phone: [phone]   Fax:  [phone]  Name: EULAS SCHWEITZER MRN: [id number] Date of Birth: May 24, 1946  PHYSICAL THERAPY DISCHARGE SUMMARY  Visits from Start of Care: 14  Current functional level related to goals / functional outcomes: All goals met   Remaining deficits: None   Education / Equipment: HEP, compression garment  Plan: Patient agrees to discharge.  Patient goals were met. Patient is being discharged due to meeting the stated rehab goals.  ?????    Allyson Sabal [place], [place] 04/22/19 2:58 PM

## 2019-05-06 ENCOUNTER — Encounter

## 2019-05-06 ENCOUNTER — Ambulatory Visit: Payer: Medicare Other | Admitting: Neurology

## 2019-05-11 ENCOUNTER — Other Ambulatory Visit: Payer: Self-pay | Admitting: Family Medicine

## 2019-05-11 DIAGNOSIS — I10 Essential (primary) hypertension: Secondary | ICD-10-CM

## 2019-06-23 ENCOUNTER — Ambulatory Visit
Admission: RE | Admit: 2019-06-23 | Discharge: 2019-06-23 | Disposition: A | Discharge status: Home / Self Care | Payer: Medicare Other | Source: Ambulatory Visit | Attending: Urology | Admitting: Urology

## 2019-06-23 ENCOUNTER — Other Ambulatory Visit: Payer: Self-pay

## 2019-06-23 DIAGNOSIS — C61 Malignant neoplasm of prostate: Secondary | ICD-10-CM

## 2019-06-23 MED ORDER — GADOBENATE DIMEGLUMINE 529 MG/ML IV SOLN
12.0000 mL | Freq: Once | INTRAVENOUS | Status: AC | PRN
  Administered 2019-06-23: 12 mL via INTRAVENOUS

## 2019-07-04 ENCOUNTER — Other Ambulatory Visit: Payer: Self-pay | Admitting: Family Medicine

## 2019-07-04 DIAGNOSIS — E785 Hyperlipidemia, unspecified: Secondary | ICD-10-CM

## 2019-07-25 ENCOUNTER — Ambulatory Visit (INDEPENDENT_AMBULATORY_CARE_PROVIDER_SITE_OTHER): Payer: Medicare Other | Admitting: Psychiatry

## 2019-07-25 ENCOUNTER — Encounter (HOSPITAL_COMMUNITY): Payer: Self-pay | Admitting: Psychiatry

## 2019-07-25 ENCOUNTER — Other Ambulatory Visit: Payer: Self-pay

## 2019-07-25 DIAGNOSIS — F3342 Major depressive disorder, recurrent, in full remission: Secondary | ICD-10-CM | POA: Diagnosis not present

## 2019-07-25 MED ORDER — VORTIOXETINE HBR 20 MG PO TABS: 20.0000 mg | ORAL_TABLET | Freq: Every day | ORAL | 1 refills | Status: DC

## 2019-07-25 MED ORDER — MIRTAZAPINE 30 MG PO TBDP: 30.0000 mg | ORAL_TABLET | Freq: Every day | ORAL | 8 refills | Status: DC

## 2019-07-25 NOTE — Progress Notes (Signed)
.  Psychiatric Initial Adult Assessment   Patient Identification: [name] MRN:  [id number] Date of Evaluation:  07/25/2019 Referral Source: Dr. Wells Guiles Mathis Chief Complaint:   Visit Diagnosis: Major depression mild  This patient has been treated in this clinic for well over a year for clinical major depression.  His specific stressor was that of esophageal cancer and is done very well.  He is finished radiation therapy.  In the last 6 months he had his G-tube removed.  His radiation therapy is done.  He is in some form of rehab but is swallowing much better without any pain or any dysfunction.  Physically he is very stable.  His only complaint is that he notices a difference in the short-term memory.  It should be noted that he never received chemotherapy and the radiation therapy he had was focused directly on his esophagus and never to his brain.  The patient says his mood is good.  He is sleeping and eating well.  He is now gaining weight.  His energy level is improved.  He is active watching TV and being cautious about the coronavirus.  Is a good relationship with his wife [name].  Financially he is stable.  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  They like it a lot better.  They do not have any children but they have 2 cats both of whom are doing well.  Patient is happy with life.  Depression Symptoms:  fatigue, (Hypo) Manic Symptoms:   Anxiety Symptoms:   Psychotic Symptoms:   PTSD Symptoms:   Past Psychiatric History: Trintellix, Wellbutrin  Previous Psychotropic Medications:   Substance Abuse History in the last 12 months:    Consequences of Substance Abuse:   Past Medical History:  Past Medical History:  Diagnosis Date  . Allergy   . Alzheimer disease ([place]) 06/2018   diagnosed with early stage alzheimers  . Arthritis    osteoarthritis  . Cancer [facility])    testicular, age 22, orchiectomy 59  . Cataract     B/L CATARACTS NO SURGERY  . Dementia ([place])   . Depression   . Dyslipidemia   . Heartburn    TAKES ZANTAC  . History of radiation therapy 12/02/18- 01/14/19   Head and neck/ base of tongue. 60 Gy over 30 fractions.   . Hypertension   . Hypogonadism male   . Prostate cancer ([place]) 06/07/11   gleason 3+3=6, vol 59.5 cc  . Sleep apnea   . Status post chemotherapy    testicular cancer 1992, Dr [name]    Past Surgical History:  Procedure Laterality Date  . APPENDECTOMY    . COLONOSCOPY  2007   gessner  . DIRECT LARYNGOSCOPY N/A 08/19/2018   Procedure: DIRECT LARYNGOSCOPY;  Surgeon: Bruce Baptist, MD;  Location: [place];  Service: ENT;  Laterality: N/A;  . INGUINAL HERNIA REPAIR     w/orchiectomy 1992, retroperitoneal lymph node dissection  . IR GASTROSTOMY TUBE REMOVAL  02/28/2019  . MENISECTOMY     R&L knee surgeries  . PROSTATE SURGERY     biopsy x 2  . TONGUE BIOPSY N/A 08/19/2018   Procedure: BIOPSY OF TONGUE BASE MASS;  Surgeon: Bruce Baptist, MD;  Location: [place];  Service: ENT;  Laterality: N/A;    Family Psychiatric History:   Family History:  Family History  Problem Relation Age of Onset  . Lung cancer Mother   .  Hypertension Mother   . Lung cancer Father   . Heart failure Father   . Hypertension Father     Social History:   Social History   Socioeconomic History  . Marital status: Married    Spouse name: Not on file  . Number of children: 0  . Years of education: Not on file  . Highest education level: Not on file  Occupational History  . Occupation: RETIRED    Employer: [employer]  Tobacco Use  . Smoking status: Former Smoker    Packs/day: 1.00    Years: 20.00    Pack years: 20.00    Types: Cigarettes    Quit date: 08/03/1999    Years since quitting: 19.9  . Smokeless tobacco: Never Used  Substance and Sexual Activity  . Alcohol use: Not Currently  . Drug use: No  . Sexual activity: Yes  Other Topics  Concern  . Not on file  Social History Narrative  . Not on file   Social Determinants of Health   Financial Resource Strain:   . Difficulty of Paying Living Expenses: Not on file  Food Insecurity:   . Worried About Charity fundraiser in the Last Year: Not on file  . Ran Out of Food in the Last Year: Not on file  Transportation Needs: No Transportation Needs  . Lack of Transportation (Medical): No  . Lack of Transportation (Non-Medical): No  Physical Activity:   . Days of Exercise per Week: Not on file  . Minutes of Exercise per Session: Not on file  Stress:   . Feeling of Stress : Not on file  Social Connections:   . Frequency of Communication with Friends and Family: Not on file  . Frequency of Social Gatherings with Friends and Family: Not on file  . Attends Religious Services: Not on file  . Active Member of Clubs or Organizations: Not on file  . Attends Archivist Meetings: Not on file  . Marital Status: Not on file    Additional Social History:   Allergies:  No Known Allergies  Metabolic Disorder Labs: No results found for: HGBA1C, MPG No results found for: PROLACTIN Lab Results  Component Value Date   CHOL 146 09/17/2017   TRIG 63 09/17/2017   HDL 63 09/17/2017   CHOLHDL 2.3 09/17/2017   VLDL 18 09/11/2016   LDLCALC 70 09/17/2017   LDLCALC 67 09/11/2016   Lab Results  Component Value Date   TSH 3.725 04/10/2019    Therapeutic Level Labs: No results found for: LITHIUM No results found for: CBMZ No results found for: VALPROATE  Current Medications: Current Outpatient Medications  Medication Sig Dispense Refill  . donepezil (ARICEPT) 10 MG tablet Take 1 tablet (10 mg total) by mouth at bedtime. 90 tablet 2  . HYDROcodone-acetaminophen (NORCO/VICODIN) 5-325 MG tablet Take 1 tablet by mouth every 6 (six) hours as needed for moderate pain. For up to 5 days    . lidocaine (XYLOCAINE) 2 % solution Patient: Mix 1part 2% viscous lidocaine, 1part  H20. Swish & swallow 49mL of diluted mixture, 41min before meals and at bedtime, up to QID 100 mL 5  . mirtazapine (REMERON SOLTAB) 30 MG disintegrating tablet Take 1 tablet (30 mg total) by mouth at bedtime. 30 tablet 8  . ondansetron (ZOFRAN-ODT) 4 MG disintegrating tablet Take 4 mg by mouth every 8 (eight) hours as needed for nausea.    . polyethylene glycol powder (GLYCOLAX/MIRALAX) powder Take 17 g by mouth  daily. 3350 g 11  . primidone (MYSOLINE) 50 MG tablet Take 1 tablet (50 mg total) by mouth 2 (two) times daily. 180 tablet 2  . rosuvastatin (CRESTOR) 20 MG tablet TAKE 1 TABLET(20 MG) BY MOUTH DAILY 90 tablet 0  . sodium fluoride (PREVIDENT 5000 PLUS) 1.1 % CREA dental cream Apply cream to tooth brush. Brush teeth for 2 minutes. Spit out excess. DO NOT rinse afterwards. Repeat nightly. 1 Tube prn  . terazosin (HYTRIN) 2 MG capsule TAKE 1 CAPSULE(2 MG) BY MOUTH DAILY 90 capsule 1  . triamcinolone (NASACORT ALLERGY 24HR) 55 MCG/ACT AERO nasal inhaler Place 1 spray into the nose daily.    Marland Kitchen vortioxetine HBr (TRINTELLIX) 20 MG TABS tablet Take 1 tablet (20 mg total) by mouth daily. 90 tablet 1   No current facility-administered medications for this visit.    Musculoskeletal: Strength & Muscle Tone: within normal limits Gait & Station: normal Patient leans: N/A  Psychiatric Specialty Exam: ROS  There were no vitals taken for this visit.There is no height or weight on file to calculate BMI.  General Appearance: Casual  Eye Contact:  Good  Speech:  Normal Rate  Volume:  Normal  Mood:  Negative  Affect:  Congruent  Thought Process:  Goal Directed  Orientation:  Full (Time, Place, and Person)  Thought Content:  Logical  Suicidal Thoughts:  No  Homicidal Thoughts:  No  Memory:  NA  Judgement:  Good  Insight:  Fair  Psychomotor Activity:  Normal  Concentration:    Recall:  [place] of Knowledge:Good  Language: Good  Akathisia:  No  Handed:  Right  AIMS (if indicated):  not  done  Assets:  Desire for Improvement Financial Resources/Insurance  ADL's:  Intact  Cognition: Impaired,  Moderate  Sleep:     Screenings: Mini-Mental     Office Visit from 06/08/2016 in [facility]  Total Score (max 30 points )  28    PHQ2-9     Office Visit from 09/18/2018 in [place] Visit from 09/17/2017 in [place] Visit from 09/11/2016 in [place] Visit from 08/31/2015 in [facility] Visit from 08/24/2014 in [place] [facility]  PHQ-2 Total Score  6  4  4  6  6   PHQ-9 Total Score  19  10  -  16  -      Assessment and Plan:  12/18/20208:57 AM   This patient's first and only problem is that of major depression.  To be clear he takes Remeron 30 mg SolTab but he also takes Trintellix.  We will continue both of these medications.  He is not in therapy at this time.  He is doing very well.  He is functioning very well.  He is not suicidal.  Over the last year or 2 he stopped doing the bills because he was having some cognitive problems.  He does he has repeated questions to his wife [name].  I think now that his physical status is stable and that he is taking 2 antidepressants and his mood is very good we will go ahead and perform a Mini-Mental status exam when he returns to see me in 7 months.  We will look closely at the issue of him memory impairment independent of depression.  He is still functioning well enough.  He no longer drives but he still does all his basic ADLs and a few of the institutional ADLs.  He  still is trying to read.  Patient is very stable.

## 2019-08-21 ENCOUNTER — Other Ambulatory Visit: Payer: Self-pay

## 2019-08-21 ENCOUNTER — Encounter: Payer: Self-pay | Admitting: Family Medicine

## 2019-08-21 ENCOUNTER — Ambulatory Visit (INDEPENDENT_AMBULATORY_CARE_PROVIDER_SITE_OTHER): Payer: Medicare Other | Admitting: Family Medicine

## 2019-08-21 VITALS — BP 118/82 | HR 85 | Temp 98.4°F | Ht 66.0 in | Wt 150.4 lb

## 2019-08-21 DIAGNOSIS — Z8547 Personal history of malignant neoplasm of testis: Secondary | ICD-10-CM

## 2019-08-21 DIAGNOSIS — Z1211 Encounter for screening for malignant neoplasm of colon: Secondary | ICD-10-CM

## 2019-08-21 DIAGNOSIS — Z8546 Personal history of malignant neoplasm of prostate: Secondary | ICD-10-CM | POA: Diagnosis not present

## 2019-08-21 DIAGNOSIS — E785 Hyperlipidemia, unspecified: Secondary | ICD-10-CM

## 2019-08-21 DIAGNOSIS — K219 Gastro-esophageal reflux disease without esophagitis: Secondary | ICD-10-CM

## 2019-08-21 DIAGNOSIS — I10 Essential (primary) hypertension: Secondary | ICD-10-CM | POA: Diagnosis not present

## 2019-08-21 DIAGNOSIS — R1312 Dysphagia, oropharyngeal phase: Secondary | ICD-10-CM | POA: Insufficient documentation

## 2019-08-21 DIAGNOSIS — C01 Malignant neoplasm of base of tongue: Secondary | ICD-10-CM

## 2019-08-21 DIAGNOSIS — Z0000 Encounter for general adult medical examination without abnormal findings: Secondary | ICD-10-CM | POA: Diagnosis not present

## 2019-08-21 DIAGNOSIS — C099 Malignant neoplasm of tonsil, unspecified: Secondary | ICD-10-CM

## 2019-08-21 DIAGNOSIS — G4733 Obstructive sleep apnea (adult) (pediatric): Secondary | ICD-10-CM

## 2019-08-21 MED ORDER — ROSUVASTATIN CALCIUM 20 MG PO TABS: 20.0000 mg | ORAL_TABLET | Freq: Every day | ORAL | 3 refills | Status: DC

## 2019-08-21 NOTE — Patient Instructions (Signed)
  [name] , Thank you for taking time to come for your Medicare Wellness Visit. I appreciate your ongoing commitment to your health goals. Please review the following plan we discussed and let me know if I can assist you in the future.   These are the goals we discussed: Goals   None     This is a list of the screening recommended for you and due dates:  Health Maintenance  Topic Date Due  . Pneumonia vaccines (2 of 2 - PPSV23) 08/30/2016  . Cologuard (Stool DNA test)  11/01/2019  . Tetanus Vaccine  07/25/2022  . Flu Shot  Completed  .  Hepatitis C: One time screening is recommended by Center for Disease Control  (CDC) for  adults born from [birth year] through 1963.   Completed

## 2019-08-21 NOTE — Progress Notes (Signed)
[name] is a 74 y.o. male who presents for annual wellness visit,CPE and follow-up on chronic medical conditions.  He has mild cognitive impairment and does follow-up with neurology concerning this.  His does have him quite concerned as he recognizes that he is slowly getting worse.  He does have an underlying history of tongue cancer and is being followed by ENT as well as Dr. Nicolette Bang in [place].  He has no concerns over this.  He recently saw Dr. Casimiro Needle for his underlying depression.  He is taking Remeron as well as Trintellix which seems to be helping him hold his own.  He follows up with Dr. Alinda Money concerning his prostate cancer.  He has remote history of testicular cancer.  He does have reflux disease but presently is not taking any medicine and seems to be doing okay.  He is taking Crestor and having no aches or pains with that.  He does take Hytrin for his bladder related symptoms as well as blood pressure.  He does have a history of OSA but presently is not on CPAP apparently does not qualify.  His wife is with him in the room.  Social and family history is reviewed.   Immunizations and Health Maintenance Immunization History  Administered Date(s) Administered  . Influenza Split 04/18/2012, 05/06/2013, 05/27/2015  . Influenza Whole 04/12/2011  . Influenza, High Dose Seasonal PF 05/21/2017, 05/16/2018, 04/11/2019  . Influenza-Unspecified 04/23/2014, 05/18/2016, 05/21/2017, 05/16/2018  . Pneumococcal Conjugate-13 08/31/2015  . Pneumococcal Polysaccharide-23 12/09/2009  . Tdap 01/17/2000, 07/25/2012  . Zoster 12/08/2008   Health Maintenance Due  Topic Date Due  . PNA vac Low Risk Adult (2 of 2 - PPSV23) 08/30/2016    Last colonoscopy: 08/17/2006 Last PSA: 06/08/16 Dentist:  Q three months Ophtho:  Yearly  Exercise: sometimes  Other doctors caring for patient include: Dr. Hubbard Robinson., Dr. Carles Collet neuro, Dr. Brantley Fling,   Advanced Directives: not on file copy asked  for  Does Patient Have a Medical Advance Directive?: Yes Type of Advance Directive: [place] Does patient want to make changes to medical advance directive?: No - Patient declined Copy of [place] in Chart?: No - copy requested  Depression screen:  See questionnaire below.     Depression screen [facility] 2/9 08/21/2019 09/18/2018 09/17/2017 09/11/2016 08/31/2015  Decreased Interest 1 3 2 3 3   Down, Depressed, Hopeless 3 3 2 1 3   PHQ - 2 Score 4 6 4 4 6   Altered sleeping 0 3 0 - 2  Tired, decreased energy 0 2 1 - 2  Change in appetite 1 2 2  - 3  Feeling bad or failure about yourself  0 0 1 - 0  Trouble concentrating 3 3 2  - 3  Moving slowly or fidgety/restless 0 3 0 - 0  Suicidal thoughts 0 0 0 - 0  PHQ-9 Score 8 19 10  - 16  Difficult doing work/chores Very difficult Very difficult Somewhat difficult - Somewhat difficult    Fall Screen: See Questionaire below.   Fall Risk  08/21/2019 12/19/2018 09/18/2018 06/19/2018 12/11/2017  Falls in the past year? 0 0 1 1 Yes  Number falls in past yr: - 0 0 1 1  Injury with Fall? - 0 1 1 Yes  Risk Factor Category  - - - - High Fall Risk  Risk for fall due to : - - Impaired balance/gait History of fall(s);Impaired balance/gait;Mental status change Other (Comment)  Follow up - Falls evaluation completed -  Falls evaluation completed Falls evaluation completed;Education provided;Falls prevention discussed    ADL screen:  See questionnaire below.  Functional Status Survey: Is the patient deaf or have difficulty hearing?: No Does the patient have difficulty seeing, even when wearing glasses/contacts?: No Does the patient have difficulty concentrating, remembering, or making decisions?: Yes Does the patient have difficulty walking or climbing stairs?: Yes(joint pain) Does the patient have difficulty dressing or bathing?: No Does the patient have difficulty doing errands alone such as visiting a doctor's office or shopping?:  Yes(Pt wife helps)   Review of Systems  Constitutional: -, -unexpected weight change, -anorexia, -fatigue Allergy: -sneezing, -itching, -congestion Dermatology: denies changing moles, rash, lumps ENT: -runny nose, -ear pain, -sore throat,  Cardiology:  -chest pain, -palpitations, -orthopnea, Respiratory: -cough, -shortness of breath, -dyspnea on exertion, -wheezing,  Gastroenterology: -abdominal pain, -nausea, -vomiting, -diarrhea, -constipation, -dysphagia Hematology: -bleeding or bruising problems Musculoskeletal: -arthralgias, -myalgias, -joint swelling, -back pain, - Ophthalmology: -vision changes,  Urology: -dysuria, -difficulty urinating,  -urinary frequency, -urgency, incontinence Neurology: -, -numbness, , -memory loss, -falls, -dizziness    PHYSICAL EXAM:  BP 118/82 (BP Location: Left Arm, Patient Position: Sitting)   Pulse 85   Temp 98.4 F (36.9 C)   Ht 5\' 6"  (1.676 m)   Wt 150 lb 6.4 oz (68.2 kg)   SpO2 98%   BMI 24.28 kg/m   General Appearance: Alert, cooperative, no distress, appears stated age Head: Normocephalic, without obvious abnormality, atraumatic Eyes: PERRL, conjunctiva/corneas clear, EOM's intact, Ears: Normal TM's and external ear canals Nose: Nares normal, mucosa normal, no drainage or sinus   tenderness Throat: Lips, mucosa, a teeth and gums normal Neck: Supple, no lymphadenopathy, thyroid:no enlargement/tenderness/nodules; no carotid bruit or JVD Lungs: Clear to auscultation bilaterally without wheezes, rales or ronchi; respirations unlabored Heart: Regular rate and rhythm, S1 and S2 normal, no murmur, rub or gallop Abdomen: Soft, non-tender, nondistended, normoactive bowel sounds, no masses, no hepatosplenomegaly Skin: Skin color, texture, turgor normal, no rashes or lesions Lymph nodes: Cervical, supraclavicular, and axillary nodes normal Neurologic: CNII-XII intact, normal strength, sensation and gait; reflexes 2+ and symmetric throughout    Psych: flat mood, affect, hygiene and grooming  ASSESSMENT/PLAN: Routine general medical examination at a health care facility - Plan: CBC with Differential, Comprehensive metabolic panel, Lipid panel  History of prostate cancer  Hyperlipidemia, unspecified hyperlipidemia type - Plan: Lipid panel  Essential hypertension - Plan: CBC with Differential, Comprehensive metabolic panel  Malignant neoplasm of base of tongue (HCC)  OSA (obstructive sleep apnea)  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  He will call if he has any concerns with his care.   Colonoscopy recommendations reviewed.   Medicare Attestation I have personally reviewed: The patient's medical and social history Their use of alcohol, tobacco or illicit drugs Their current medications and supplements The patient's functional ability including ADLs,fall risks, home safety risks, cognitive, and hearing and visual impairment Diet and physical activities Evidence for depression or mood disorders  The patient's weight, height, and BMI have been recorded in the chart.  I have made referrals, counseling, and provided education to the patient based on review of the above and I have provided the patient with a written personalized care plan for preventive services.     Jill Alexanders, MD  08/21/2019     

## 2019-08-22 LAB — CBC WITH DIFFERENTIAL/PLATELET
Basophils Absolute: 0 10*3/uL (ref 0.0–0.2)
Basos: 1 %
EOS (ABSOLUTE): 0.2 10*3/uL (ref 0.0–0.4)
Eos: 6 %
Hematocrit: 39.9 % (ref 37.5–51.0)
Hemoglobin: 13.4 g/dL (ref 13.0–17.7)
Immature Grans (Abs): 0 10*3/uL (ref 0.0–0.1)
Immature Granulocytes: 1 %
Lymphocytes Absolute: 0.5 10*3/uL — ABNORMAL LOW (ref 0.7–3.1)
Lymphs: 13 %
MCH: 30.5 pg (ref 26.6–33.0)
MCHC: 33.6 g/dL (ref 31.5–35.7)
MCV: 91 fL (ref 79–97)
Monocytes Absolute: 0.5 10*3/uL (ref 0.1–0.9)
Monocytes: 14 %
Neutrophils Absolute: 2.3 10*3/uL (ref 1.4–7.0)
Neutrophils: 65 %
Platelets: 187 10*3/uL (ref 150–450)
RBC: 4.39 x10E6/uL (ref 4.14–5.80)
RDW: 12.8 % (ref 11.6–15.4)
WBC: 3.5 10*3/uL (ref 3.4–10.8)

## 2019-08-22 LAB — COMPREHENSIVE METABOLIC PANEL
ALT: 29 IU/L (ref 0–44)
AST: 34 IU/L (ref 0–40)
Albumin/Globulin Ratio: 2 (ref 1.2–2.2)
Albumin: 4.3 g/dL (ref 3.7–4.7)
Alkaline Phosphatase: 86 IU/L (ref 39–117)
BUN/Creatinine Ratio: 23 (ref 10–24)
BUN: 18 mg/dL (ref 8–27)
Bilirubin Total: 0.4 mg/dL (ref 0.0–1.2)
CO2: 27 mmol/L (ref 20–29)
Calcium: 9.5 mg/dL (ref 8.6–10.2)
Chloride: 102 mmol/L (ref 96–106)
Creatinine, Ser: 0.77 mg/dL (ref 0.76–1.27)
GFR calc Af Amer: 106 mL/min/{1.73_m2} (ref >59)
GFR calc non Af Amer: 91 mL/min/{1.73_m2} (ref >59)
Globulin, Total: 2.1 g/dL (ref 1.5–4.5)
Glucose: 84 mg/dL (ref 65–99)
Potassium: 4.5 mmol/L (ref 3.5–5.2)
Sodium: 140 mmol/L (ref 134–144)
Total Protein: 6.4 g/dL (ref 6.0–8.5)

## 2019-08-22 LAB — LIPID PANEL
Chol/HDL Ratio: 2.6 ratio (ref 0.0–5.0)
Cholesterol, Total: 120 mg/dL (ref 100–199)
HDL: 47 mg/dL (ref >39)
LDL Chol Calc (NIH): 48 mg/dL (ref 0–99)
Triglycerides: 143 mg/dL (ref 0–149)
VLDL Cholesterol Cal: 25 mg/dL (ref 5–40)

## 2019-09-18 NOTE — Progress Notes (Signed)
Virtual Visit via Video Note The purpose of this virtual visit is to provide medical care while limiting exposure to the novel coronavirus.    Consent was obtained for video visit:  Yes.   Answered questions that patient had about telehealth interaction:  Yes.   I discussed the limitations, risks, security and privacy concerns of performing an evaluation and management service by telemedicine. I also discussed with the patient that there may be a patient responsible charge related to this service. The patient expressed understanding and agreed to proceed.  Pt location: Home Physician Location: office Name of referring provider:  Denita Lung, MD I connected with [name] at patients initiation/request on 09/22/2019 at  2:00 PM EST by video enabled telemedicine application and verified that I am speaking with the correct person using two identifiers. Pt MRN:  [id number] Pt DOB:  August 13, 1945 Video Participants:  [name];  Wife, phyillis supplements the history   History of Present Illness:  Patient seen today in follow-up for essential tremor and dementia. Tremor has been stable and not bothersome per pt - little worse per wife.  No SE with medication.   States that memory has not been as good and appears to be going "downhill."  Will forget things that he needs to get at the store.  No hallucinations.  He is not doing much physical activity/exercise.  He is doing puzzles on the computer for mental activity.  He is staying awake in the day.  Mood has been "fluctuating."  He admits to being easily frustrated.  Patient saw Dr. Casimiro Needle on July 25, 2019.   my previous records as well as any outside records made available were reviewed prior to todays visit.  Dr. Casimiro Needle recommended continuing the mirtazapine and Trintellix.  Current movement d/o meds:  Primidone, 50 mg twice per day Donepezil, 10 mg daily   Current Outpatient Medications on File Prior to Visit  Medication Sig  Dispense Refill  . aspirin 325 MG EC tablet Take 325 mg by mouth daily.    . cholecalciferol (VITAMIN D3) 25 MCG (1000 UNIT) tablet Take 1,000 Units by mouth daily.    Marland Kitchen donepezil (ARICEPT) 10 MG tablet Take 1 tablet (10 mg total) by mouth at bedtime. 90 tablet 2  . finasteride (PROSCAR) 5 MG tablet TK 1 T PO QD    . HYDROcodone-acetaminophen (NORCO/VICODIN) 5-325 MG tablet Take 1 tablet by mouth every 6 (six) hours as needed for moderate pain. For up to 5 days    . ipratropium (ATROVENT) 0.06 % nasal spray USE 2 SPRAYS IEN BID PRN FOR DRAINAGE    . mirtazapine (REMERON SOLTAB) 30 MG disintegrating tablet Take 1 tablet (30 mg total) by mouth at bedtime. 30 tablet 8  . polyethylene glycol powder (GLYCOLAX/MIRALAX) powder Take 17 g by mouth daily. 3350 g 11  . primidone (MYSOLINE) 50 MG tablet Take 1 tablet (50 mg total) by mouth 2 (two) times daily. 180 tablet 2  . rosuvastatin (CRESTOR) 20 MG tablet Take 1 tablet (20 mg total) by mouth daily. 90 tablet 3  . sodium fluoride (PREVIDENT 5000 PLUS) 1.1 % CREA dental cream Apply cream to tooth brush. Brush teeth for 2 minutes. Spit out excess. DO NOT rinse afterwards. Repeat nightly. 1 Tube prn  . terazosin (HYTRIN) 2 MG capsule TAKE 1 CAPSULE(2 MG) BY MOUTH DAILY 90 capsule 1  . vortioxetine HBr (TRINTELLIX) 20 MG TABS tablet Take 1 tablet (20 mg total) by mouth daily. [place]  tablet 1   No current facility-administered medications on file prior to visit.     Observations/Objective:   There were no vitals filed for this visit. GEN:  The patient appears stated age and is in NAD.  Neurological examination:  Orientation: The patient is alert and oriented x3. Cranial nerves: There is good facial symmetry. There is nofacial hypomimia.  The speech is fluent and clear. Soft palate rises symmetrically and there is no tongue deviation. Hearing is intact to conversational tone. Motor: Strength is at least antigravity x 4.   Shoulder shrug is equal and  symmetric.  There is no pronator drift.  Movement examination: Tone: unable Abnormal movements: No rest tremor.  No postural tremor.  Very mild intention tremor. Coordination:  There is no decremation with RAM's.  No trouble with finger-to-nose with eyes closed.   I have reviewed and interpreted the following labs independently   Chemistry      Component Value Date/Time   NA 140 08/21/2019 1450   K 4.5 08/21/2019 1450   CL 102 08/21/2019 1450   CO2 27 08/21/2019 1450   BUN 18 08/21/2019 1450   CREATININE 0.77 08/21/2019 1450   CREATININE 0.81 04/10/2019 1442   CREATININE 0.84 06/08/2016 1425      Component Value Date/Time   CALCIUM 9.5 08/21/2019 1450   ALKPHOS 86 08/21/2019 1450   AST 34 08/21/2019 1450   AST 19 09/19/2018 0757   ALT 29 08/21/2019 1450   ALT 18 09/19/2018 0757   BILITOT 0.4 08/21/2019 1450   BILITOT 0.5 09/19/2018 0757     Lab Results  Component Value Date   WBC 3.5 08/21/2019   HGB 13.4 08/21/2019   HCT 39.9 08/21/2019   MCV 91 08/21/2019   PLT 187 08/21/2019       Assessment and Plan:   1.  Essential tremor  -Continue primidone, 50 mg twice per day. 2.  Alzheimer's dementia  -Last neurocognitive testing was in October, 2019 demonstrating evidence of mild Alzheimer's dementia.  -Continue donepezil, 10 mg daily.  RF today 3.  Depression  -Under the care of Dr. Casimiro Needle.  On mirtazapine and Trintellix.  States that having some mood issues but also states that he didn't mention to Dr. Casimiro Needle.  Told him to make sure that he is aware.  Pt/wife expressed understanding.  Follow Up Instructions:  9 months to 1 year  -I discussed the assessment and treatment plan with the patient. The patient was provided an opportunity to ask questions and all were answered. The patient agreed with the plan and demonstrated an understanding of the instructions.   The patient was advised to call back or seek an in-person evaluation if the symptoms worsen or if the  condition fails to improve as anticipated.    Total time spent on today's visit was 20 minutes, including both face-to-face time and nonface-to-face time.  Time included that spent on review of records (prior notes available to me/labs/imaging if pertinent), discussing treatment and goals, answering patient's questions and coordinating care.   Alonza Bogus, DO

## 2019-09-22 ENCOUNTER — Telehealth (INDEPENDENT_AMBULATORY_CARE_PROVIDER_SITE_OTHER): Payer: Medicare Other | Admitting: Neurology

## 2019-09-22 ENCOUNTER — Other Ambulatory Visit: Payer: Self-pay

## 2019-09-22 ENCOUNTER — Encounter: Payer: Self-pay | Admitting: Neurology

## 2019-09-22 VITALS — Ht 68.0 in | Wt 145.0 lb

## 2019-09-22 DIAGNOSIS — F0280 Dementia in other diseases classified elsewhere without behavioral disturbance: Secondary | ICD-10-CM

## 2019-09-22 DIAGNOSIS — G301 Alzheimer's disease with late onset: Secondary | ICD-10-CM

## 2019-09-22 DIAGNOSIS — F320 Major depressive disorder, single episode, mild: Secondary | ICD-10-CM

## 2019-09-22 MED ORDER — PRIMIDONE 50 MG PO TABS: 50.0000 mg | ORAL_TABLET | Freq: Two times a day (BID) | ORAL | 2 refills | Status: DC

## 2019-09-22 MED ORDER — DONEPEZIL HCL 10 MG PO TABS: 10.0000 mg | ORAL_TABLET | Freq: Every day | ORAL | 2 refills | Status: DC

## 2019-10-22 ENCOUNTER — Telehealth: Payer: Self-pay

## 2019-10-22 NOTE — Telephone Encounter (Signed)
Spoke to pt and advise he needs to turn in his sample to [facility]. Pt advise he would have his wife to call back to get the message. [place]

## 2019-10-23 NOTE — Progress Notes (Signed)
[name] presents for follow up of radiation completed 01/14/19 to his base of tongue/ head and neck.    Pain issues, if any: He reports pain to his joints. He specifically mentions his shoulders and hands joints.   Using a feeding tube?: removed 02/28/19 Weight changes, if any:  Wt Readings from Last 3 Encounters:  10/24/19 156 lb 12.8 oz (71.1 kg)  09/22/19 145 lb (65.8 kg)  08/21/19 150 lb 6.4 oz (68.2 kg)   Swallowing issues, if any: He reports thicker saliva since surgery and has difficulty swallowing at times. He drinks milk when swallowing to help. Smoking or chewing tobacco? No Using fluoride trays daily? Yes, daily.  Last ENT visit was on: 07/15/19 Dr. Nicolette Bang Impression  Right tongue base squamous cell carcinoma, HPV-positive, with cervical lymph node metastases, s/p TORS and bilateral neck dissection on 10/21/18. Pathology with 2.4 cm tumor, c-i-s at the margin, no PNI, no LVI, 3/42 nodes (2.5 cm, +ENE but <1 mm ). He underwent RT, completed summer 2020 There is no evidence of disease on exam today.  He sees Dr Isidore Moos in March  Other notable issues, if any:    BP 135/88 (BP Location: Right Arm, Patient Position: Sitting, Cuff Size: Normal)   Pulse 76   Temp 98.5 F (36.9 C)   Resp 20   Wt 156 lb 12.8 oz (71.1 kg)   SpO2 100%   BMI 23.84 kg/m

## 2019-10-24 ENCOUNTER — Other Ambulatory Visit: Payer: Self-pay

## 2019-10-24 ENCOUNTER — Inpatient Hospital Stay: Payer: Medicare Other | Attending: Radiation Oncology

## 2019-10-24 ENCOUNTER — Ambulatory Visit
Admission: RE | Admit: 2019-10-24 | Discharge: 2019-10-24 | Disposition: A | Discharge status: Home / Self Care | Payer: Medicare Other | Source: Ambulatory Visit | Attending: Radiation Oncology | Admitting: Radiation Oncology

## 2019-10-24 ENCOUNTER — Other Ambulatory Visit: Payer: Self-pay | Admitting: Radiation Oncology

## 2019-10-24 ENCOUNTER — Encounter: Payer: Self-pay | Admitting: Radiation Oncology

## 2019-10-24 VITALS — BP 135/88 | HR 76 | Temp 98.5°F | Resp 20 | Wt 156.8 lb

## 2019-10-24 DIAGNOSIS — F0390 Unspecified dementia without behavioral disturbance: Secondary | ICD-10-CM | POA: Diagnosis not present

## 2019-10-24 DIAGNOSIS — C01 Malignant neoplasm of base of tongue: Secondary | ICD-10-CM

## 2019-10-24 DIAGNOSIS — Z8546 Personal history of malignant neoplasm of prostate: Secondary | ICD-10-CM | POA: Insufficient documentation

## 2019-10-24 DIAGNOSIS — C099 Malignant neoplasm of tonsil, unspecified: Secondary | ICD-10-CM | POA: Diagnosis not present

## 2019-10-24 DIAGNOSIS — Z8547 Personal history of malignant neoplasm of testis: Secondary | ICD-10-CM | POA: Insufficient documentation

## 2019-10-24 DIAGNOSIS — Z79899 Other long term (current) drug therapy: Secondary | ICD-10-CM | POA: Insufficient documentation

## 2019-10-24 DIAGNOSIS — Z7982 Long term (current) use of aspirin: Secondary | ICD-10-CM | POA: Insufficient documentation

## 2019-10-24 DIAGNOSIS — K8020 Calculus of gallbladder without cholecystitis without obstruction: Secondary | ICD-10-CM | POA: Diagnosis not present

## 2019-10-24 DIAGNOSIS — Z1329 Encounter for screening for other suspected endocrine disorder: Secondary | ICD-10-CM

## 2019-10-24 DIAGNOSIS — R911 Solitary pulmonary nodule: Secondary | ICD-10-CM | POA: Diagnosis not present

## 2019-10-24 DIAGNOSIS — Z85810 Personal history of malignant neoplasm of tongue: Secondary | ICD-10-CM | POA: Insufficient documentation

## 2019-10-24 DIAGNOSIS — R635 Abnormal weight gain: Secondary | ICD-10-CM

## 2019-10-24 LAB — T4, FREE: Free T4: 0.56 ng/dL — ABNORMAL LOW (ref 0.61–1.12)

## 2019-10-24 LAB — TSH: TSH: 8.193 u[IU]/mL — ABNORMAL HIGH (ref 0.320–4.118)

## 2019-10-24 NOTE — Progress Notes (Signed)
Radiation Oncology         [phone] ________________________________  Name: [name] MRN: [id number]  Date: 04/11/2019  DOB: 07-26-1946  Follow-Up Visit Note in person  CC: Denita Lung, MD  Leta Baptist, MD  Diagnosis and Prior Radiotherapy:       ICD-10-CM   1. Malignant neoplasm of base of tongue [facility])  C01      Radiation Treatment Dates: 12/02/2018 through 01/14/2019 Site Technique Total Dose Dose per Fx Completed Fx Beam Energies  Head & neck: HN_BOT IMRT 60/60 2 30/30 6X   CHIEF COMPLAINT:  Here for follow-up and surveillance of throat cancer  Narrative:  The patient returns today for routine follow-up   He is here with his wife today.  They both feel that he is doing very well.  Pain issues, if any: He reports pain to his joints. He specifically mentions his shoulders and hands joints.  He is going to talk about this with his PCP to see if it is arthritic in nature or due to something else.  Using a feeding tube?: removed 02/28/19 Weight changes, if any: He has gained 20 to 30 pounds in the past several months   Swallowing issues, if any: He reports thicker saliva since surgery and has difficulty swallowing at times. He drinks milk when swallowing to help.  Overall he feels he is swallowing well. Smoking or chewing tobacco? No Using fluoride trays daily? Yes, daily.  Last ENT visit was on: 07/15/19 Dr. Nicolette Bang with no evidence of disease on exam.  He underwent laryngoscopy    He has been vaccinated x2 for Covid.  ALLERGIES:  has No Known Allergies.  Meds: Current Outpatient Medications  Medication Sig Dispense Refill  . donepezil (ARICEPT) 10 MG tablet Take 1 tablet (10 mg total) by mouth at bedtime. 90 tablet 2  . lidocaine (XYLOCAINE) 2 % solution Patient: Mix 1part 2% viscous lidocaine, 1part H20. Swish & swallow 53mL of diluted mixture, 28min before meals and at bedtime, up to QID 100 mL 5  . mirtazapine (REMERON SOLTAB) 30 MG disintegrating tablet Take 1  tablet (30 mg total) by mouth at bedtime. 30 tablet 8  . polyethylene glycol powder (GLYCOLAX/MIRALAX) powder Take 17 g by mouth daily. 3350 g 11  . primidone (MYSOLINE) 50 MG tablet Take 1 tablet (50 mg total) by mouth 2 (two) times daily. 180 tablet 2  . rosuvastatin (CRESTOR) 20 MG tablet TAKE 1 TABLET(20 MG) BY MOUTH DAILY 90 tablet 0  . sodium fluoride (PREVIDENT 5000 PLUS) 1.1 % CREA dental cream Apply cream to tooth brush. Brush teeth for 2 minutes. Spit out excess. DO NOT rinse afterwards. Repeat nightly. 1 Tube prn  . terazosin (HYTRIN) 2 MG capsule TAKE 1 CAPSULE(2 MG) BY MOUTH DAILY 90 capsule 1  . triamcinolone (NASACORT ALLERGY 24HR) 55 MCG/ACT AERO nasal inhaler Place 1 spray into the nose daily.    Marland Kitchen vortioxetine HBr (TRINTELLIX) 20 MG TABS tablet Take 1 tablet (20 mg total) by mouth daily. 90 tablet 1  . HYDROcodone-acetaminophen (NORCO/VICODIN) 5-325 MG tablet Take 1 tablet by mouth every 6 (six) hours as needed for moderate pain. For up to 5 days    . ondansetron (ZOFRAN-ODT) 4 MG disintegrating tablet Take 4 mg by mouth every 8 (eight) hours as needed for nausea.     No current facility-administered medications for this encounter.     Physical Findings: The patient is in no acute distress. Patient is alert and oriented. Vitals:  10/24/19 1022  BP: 135/88  Pulse: 76  Resp: 20  Temp: 98.5 F (36.9 C)  SpO2: 100%   Wt Readings from Last 3 Encounters:  10/24/19 156 lb 12.8 oz (71.1 kg)  09/22/19 145 lb (65.8 kg)  08/21/19 150 lb 6.4 oz (68.2 kg)    General: Alert and oriented, in no acute distress HEENT: Head is normocephalic.  Oropharynx is notable for clear mucosa, no tumor  Neck: Neck is notable for no palpable masses Skin: Skin in treatment fields shows satisfactory healing  Lymphatics: see Neck Exam Psychiatric: Affect is appropriate.   Lab Findings:   Lab Results  Component Value Date   TSH 8.193 (H) 10/24/2019    Radiographic Findings: No new  studies  Impression/Plan:    1) Head and Neck Cancer Status:   no evidence of disease  2) Nutritional Status: No issues, PEG removed.  He has gained weight.  3) Risk Factors: The patient has been educated about risk factors including alcohol and tobacco abuse; they understand that avoidance of alcohol and tobacco is important to prevent recurrences as well as other cancers.  4) Swallowing: Good function, continue swallowing exercises per [facility] speech-language pathology   5) Dental: Encouraged to continue regular followup with dentistry, and dental hygiene including fluoride treatment.  The patient and his wife report that he has been compliant with dental exams every quarter.  6) Thyroid function: Today his TSH is elevated.  I will order a free T4 to be added to his blood draw  7) Other: He has follow-up with [facility] in 3 months  8) Follow-up in October. The patient was encouraged to call with any issues or questions before then.   On date of service, in total, I spent 25 minutes on this encounter.  Patient was seen in person. _____________________________________   Eppie Gibson, MD  This document serves as a record of services personally performed by Eppie Gibson, MD. It was created on her behalf by Wilburn Mylar, a trained medical scribe. The creation of this record is based on the scribe's personal observations and the provider's statements to them. This document has been checked and approved by the attending provider.

## 2019-10-27 ENCOUNTER — Telehealth: Payer: Self-pay | Admitting: *Deleted

## 2019-10-27 NOTE — Telephone Encounter (Signed)
CALLED PATIENT TO INFORM OF FU WITH DR. Isidore Moos ON 05-28-20 @ 11 AM, PATIENT AGREED TO DAY AND TIME

## 2019-10-28 ENCOUNTER — Other Ambulatory Visit: Payer: Self-pay | Admitting: Family Medicine

## 2019-10-28 DIAGNOSIS — I10 Essential (primary) hypertension: Secondary | ICD-10-CM

## 2019-10-31 ENCOUNTER — Other Ambulatory Visit: Payer: Self-pay | Admitting: Radiation Oncology

## 2019-10-31 ENCOUNTER — Telehealth: Payer: Self-pay | Admitting: *Deleted

## 2019-10-31 DIAGNOSIS — E038 Other specified hypothyroidism: Secondary | ICD-10-CM

## 2019-10-31 MED ORDER — LEVOTHYROXINE SODIUM 25 MCG PO TABS: 25.0000 ug | ORAL_TABLET | Freq: Every day | ORAL | 7 refills | Status: DC

## 2019-10-31 NOTE — Telephone Encounter (Signed)
CALLED PATIENT TO ASK ABOUT COMING IN FOR A LAB IN LATE MAY, PATIENT AGREED TO COME IN ON 12-26-19 @ 11:15 AM

## 2019-11-12 LAB — COLOGUARD: Cologuard: NEGATIVE

## 2019-11-13 NOTE — Progress Notes (Signed)
Advise pt of cologurad results . [place]

## 2019-11-25 ENCOUNTER — Encounter: Payer: Self-pay | Admitting: Family Medicine

## 2019-12-26 ENCOUNTER — Other Ambulatory Visit: Payer: Self-pay

## 2019-12-26 ENCOUNTER — Ambulatory Visit
Admission: RE | Admit: 2019-12-26 | Discharge: 2019-12-26 | Disposition: A | Discharge status: Home / Self Care | Payer: Medicare Other | Source: Ambulatory Visit | Attending: Radiation Oncology | Admitting: Radiation Oncology

## 2019-12-26 DIAGNOSIS — R946 Abnormal results of thyroid function studies: Secondary | ICD-10-CM | POA: Insufficient documentation

## 2019-12-26 DIAGNOSIS — Z85810 Personal history of malignant neoplasm of tongue: Secondary | ICD-10-CM | POA: Diagnosis present

## 2019-12-26 DIAGNOSIS — Z79899 Other long term (current) drug therapy: Secondary | ICD-10-CM | POA: Diagnosis not present

## 2019-12-26 DIAGNOSIS — E038 Other specified hypothyroidism: Secondary | ICD-10-CM

## 2019-12-26 LAB — TSH: TSH: 5.193 u[IU]/mL — ABNORMAL HIGH (ref 0.320–4.118)

## 2019-12-30 ENCOUNTER — Other Ambulatory Visit: Payer: Self-pay | Admitting: Radiation Oncology

## 2019-12-30 DIAGNOSIS — E038 Other specified hypothyroidism: Secondary | ICD-10-CM

## 2019-12-30 MED ORDER — LEVOTHYROXINE SODIUM 50 MCG PO TABS: 50.0000 ug | ORAL_TABLET | Freq: Every day | ORAL | 3 refills | Status: DC

## 2019-12-30 NOTE — Progress Notes (Signed)
I spoke with the patient and his wife today about his TSH results.  I recommend that he bump up to a 50 mcg dose of his levothyroxine daily.  I altered his prescription.  They are pleased with this plan.  -----------------------------------  Eppie Gibson, MD

## 2020-01-27 ENCOUNTER — Other Ambulatory Visit (HOSPITAL_COMMUNITY): Payer: Self-pay | Admitting: Psychiatry

## 2020-02-18 ENCOUNTER — Telehealth (INDEPENDENT_AMBULATORY_CARE_PROVIDER_SITE_OTHER): Payer: Medicare Other | Admitting: Psychiatry

## 2020-02-18 ENCOUNTER — Other Ambulatory Visit: Payer: Self-pay

## 2020-02-18 DIAGNOSIS — F3342 Major depressive disorder, recurrent, in full remission: Secondary | ICD-10-CM | POA: Diagnosis not present

## 2020-02-18 MED ORDER — VORTIOXETINE HBR 20 MG PO TABS: 20.0000 mg | ORAL_TABLET | Freq: Every day | ORAL | 1 refills | Status: DC

## 2020-02-18 MED ORDER — MIRTAZAPINE 30 MG PO TBDP: 30.0000 mg | ORAL_TABLET | Freq: Every day | ORAL | 8 refills | Status: DC

## 2020-02-18 NOTE — Progress Notes (Signed)
.  Psychiatric Initial Adult Assessment   Patient Identification: [name] MRN:  [id number] Date of Evaluation:  02/18/2020 Referral Source: Dr. Wells Guiles Tat Chief Complaint:   Visit Diagnosis: Major depression mild  Today the patient is actually doing well.  His mood is improved.  He still adjusting to the new place where he is living.  He is living with his wife [name] who is very supportive.  The patient still is able to watch TV and follow the program.  He watches the television shows Blue blood and signed field.  He says he has a big problem remembering things.  On the other hand he functions actually well.  He still enjoys things.  He now sleeping and eating well.  He is no longer in any treatment for his esophageal cancer.  Noted is a never had radiation to his brain.  Fact he never had chemotherapy.  His radiation was clearly towards his esophagus.  The patient denies being depressed.  He denies significant anxiety.  He is never had any psychotic and he does not use any drugs or alcohol.  At this time is calm compliant and denies being dysphoric.  Overall he is improved over the last 6 months.  Depression Symptoms:  fatigue, (Hypo) Manic Symptoms:   Anxiety Symptoms:   Psychotic Symptoms:   PTSD Symptoms:   Past Psychiatric History: Trintellix, Wellbutrin  Previous Psychotropic Medications:   Substance Abuse History in the last 12 months:    Consequences of Substance Abuse:   Past Medical History:  Past Medical History:  Diagnosis Date   Allergy    Alzheimer disease ([place]) 06/2018   diagnosed with early stage alzheimers   Arthritis    osteoarthritis   Cancer ([place])    testicular, age 59, orchiectomy 1992   Cataract    B/L CATARACTS NO SURGERY   Dementia ([place])    Depression    Dyslipidemia    Heartburn    TAKES ZANTAC   History of radiation therapy 12/02/18- 01/14/19   Head and neck/ base of tongue. 60 Gy over 30 fractions.    Hypertension     Hypogonadism male    Prostate cancer ([place]) 06/07/11   gleason 3+3=6, vol 59.5 cc   Sleep apnea    Status post chemotherapy    testicular cancer 1992, Dr Beryle Beams    Past Surgical History:  Procedure Laterality Date   APPENDECTOMY     COLONOSCOPY  2007   gessner   DIRECT LARYNGOSCOPY N/A 08/19/2018   Procedure: DIRECT LARYNGOSCOPY;  Surgeon: Leta Baptist, MD;  Location: [facility];  Service: ENT;  Laterality: N/A;   INGUINAL HERNIA REPAIR     w/orchiectomy 1992, retroperitoneal lymph node dissection   IR GASTROSTOMY TUBE REMOVAL  02/28/2019   MENISECTOMY     R&L knee surgeries   PROSTATE SURGERY     biopsy x 2   TONGUE BIOPSY N/A 08/19/2018   Procedure: BIOPSY OF TONGUE BASE MASS;  Surgeon: Leta Baptist, MD;  Location: [facility];  Service: ENT;  Laterality: N/A;    Family Psychiatric History:   Family History:  Family History  Problem Relation Age of Onset   Lung cancer Mother    Hypertension Mother    Lung cancer Father    Heart failure Father    Hypertension Father     Social History:   Social History   Socioeconomic History   Marital status: Married    Spouse name: Not on file  Number of children: 0   Years of education: Not on file   Highest education level: Not on file  Occupational History   Occupation: RETIRED    Employer: [employer]  Tobacco Use   Smoking status: Former Smoker    Packs/day: 1.00    Years: 20.00    Pack years: 20.00    Types: Cigarettes    Quit date: 08/03/1999    Years since quitting: 20.5   Smokeless tobacco: Never Used  Vaping Use   Vaping Use: Never used  Substance and Sexual Activity   Alcohol use: Not Currently   Drug use: No   Sexual activity: Yes  Other Topics Concern   Not on file  Social History Narrative   Not on file   Social Determinants of Health   Financial Resource Strain:    Difficulty of Paying Living Expenses:   Food Insecurity:     Worried About Charity fundraiser in the Last Year:    Arboriculturist in the Last Year:   Transportation Needs:    Film/video editor (Medical):    Lack of Transportation (Non-Medical):   Physical Activity:    Days of Exercise per Week:    Minutes of Exercise per Session:   Stress:    Feeling of Stress :   Social Connections:    Frequency of Communication with Friends and Family:    Frequency of Social Gatherings with Friends and Family:    Attends Religious Services:    Active Member of Clubs or Organizations:    Attends Archivist Meetings:    Marital Status:     Additional Social History:   Allergies:  No Known Allergies  Metabolic Disorder Labs: No results found for: HGBA1C, MPG No results found for: PROLACTIN Lab Results  Component Value Date   CHOL 120 08/21/2019   TRIG 143 08/21/2019   HDL 47 08/21/2019   CHOLHDL 2.6 08/21/2019   VLDL 18 09/11/2016   LDLCALC 48 08/21/2019   LDLCALC 70 09/17/2017   Lab Results  Component Value Date   TSH 5.193 (H) 12/26/2019    Therapeutic Level Labs: No results found for: LITHIUM No results found for: CBMZ No results found for: VALPROATE  Current Medications: Current Outpatient Medications  Medication Sig Dispense Refill   aspirin 325 MG EC tablet Take 325 mg by mouth daily.     cholecalciferol (VITAMIN D3) 25 MCG (1000 UNIT) tablet Take 1,000 Units by mouth daily.     donepezil (ARICEPT) 10 MG tablet Take 1 tablet (10 mg total) by mouth at bedtime. 90 tablet 2   finasteride (PROSCAR) 5 MG tablet TK 1 T PO QD     ipratropium (ATROVENT) 0.06 % nasal spray USE 2 SPRAYS IEN BID PRN FOR DRAINAGE     levothyroxine (SYNTHROID) 50 MCG tablet Take 1 tablet (50 mcg total) by mouth daily before breakfast. Take with water on an empty stomach at least one hour before other meds, vitamins, or food. 90 tablet 3   mirtazapine (REMERON SOLTAB) 30 MG disintegrating tablet Take 1 tablet (30 mg total) by  mouth at bedtime. 30 tablet 8   polyethylene glycol powder (GLYCOLAX/MIRALAX) powder Take 17 g by mouth daily. 3350 g 11   primidone (MYSOLINE) 50 MG tablet Take 1 tablet (50 mg total) by mouth 2 (two) times daily. 180 tablet 2   rosuvastatin (CRESTOR) 20 MG tablet Take 1 tablet (20 mg total) by mouth daily. 90 tablet 3  sodium fluoride (PREVIDENT 5000 PLUS) 1.1 % CREA dental cream Apply cream to tooth brush. Brush teeth for 2 minutes. Spit out excess. DO NOT rinse afterwards. Repeat nightly. 1 Tube prn   terazosin (HYTRIN) 2 MG capsule TAKE 1 CAPSULE(2 MG) BY MOUTH DAILY 90 capsule 1   vortioxetine HBr (TRINTELLIX) 20 MG TABS tablet Take 1 tablet (20 mg total) by mouth daily. 90 tablet 1   No current facility-administered medications for this visit.    Musculoskeletal: Strength & Muscle Tone: within normal limits Gait & Station: normal Patient leans: N/A  Psychiatric Specialty Exam: ROS  There were no vitals taken for this visit.There is no height or weight on file to calculate BMI.  General Appearance: Casual  Eye Contact:  Good  Speech:  Normal Rate  Volume:  Normal  Mood:  Negative  Affect:  Congruent  Thought Process:  Goal Directed  Orientation:  Full (Time, Place, and Person)  Thought Content:  Logical  Suicidal Thoughts:  No  Homicidal Thoughts:  No  Memory:  NA  Judgement:  Good  Insight:  Fair  Psychomotor Activity:  Normal  Concentration:    Recall:  [place] of Knowledge:Good  Language: Good  Akathisia:  No  Handed:  Right  AIMS (if indicated):  not done  Assets:  Desire for Improvement Financial Resources/Insurance  ADL's:  Intact  Cognition: Impaired,  Moderate  Sleep:     Screenings: Mini-Mental     Office Visit from 06/08/2016 in [facility]  Total Score (max 30 points ) 28    PHQ2-9     Office Visit from 08/21/2019 in [facility] Visit from 09/18/2018 in [facility] Visit from 09/17/2017 in  [facility] Visit from 09/11/2016 in [facility] Visit from 08/31/2015 in [facility]  PHQ-2 Total Score 4 6 4 4 6   PHQ-9 Total Score 8 19 10  -- 16      Assessment and Plan:  7/14/20212:02 PM   Today the patient is #1 problem is major depression in remission.  At this time will continue taking Remeron and Trintellix.  His vegetative symptoms are minimal.  He is not suicidal.  Is a very supportive wife.  He denies any neurological symptoms.  He denies any chest pain or shortness of breath.  He is functioning reasonably well.  Will be seen again in person in 6 months..  At this time the patient is doing all of his basic ADLs now him and his wife gone back to grocery shopping.

## 2020-02-25 ENCOUNTER — Ambulatory Visit (HOSPITAL_COMMUNITY): Payer: Medicare Other | Admitting: Psychiatry

## 2020-04-06 ENCOUNTER — Ambulatory Visit: Payer: Medicare Other | Attending: Critical Care Medicine

## 2020-04-06 DIAGNOSIS — Z23 Encounter for immunization: Secondary | ICD-10-CM

## 2020-04-06 NOTE — Progress Notes (Signed)
   Covid-19 Vaccination Clinic  Name:  [name]    MRN: [id number] DOB: 22-Mar-1946  04/06/2020  [name] was observed post Covid-19 immunization for 15 minutes without incident. He was provided with Vaccine Information Sheet and instruction to access the V-Safe system.   [name] was instructed to call 911 with any severe reactions post vaccine: Marland Kitchen Difficulty breathing  . Swelling of face and throat  . A fast heartbeat  . A bad rash all over body  . Dizziness and weakness

## 2020-05-05 ENCOUNTER — Other Ambulatory Visit: Payer: Self-pay | Admitting: Family Medicine

## 2020-05-05 DIAGNOSIS — I10 Essential (primary) hypertension: Secondary | ICD-10-CM

## 2020-05-19 ENCOUNTER — Ambulatory Visit (INDEPENDENT_AMBULATORY_CARE_PROVIDER_SITE_OTHER): Payer: Medicare Other | Admitting: Family Medicine

## 2020-05-19 ENCOUNTER — Other Ambulatory Visit: Payer: Self-pay

## 2020-05-19 ENCOUNTER — Encounter: Payer: Self-pay | Admitting: Family Medicine

## 2020-05-19 VITALS — BP 102/70 | HR 75 | Temp 97.6°F | Wt 157.0 lb

## 2020-05-19 DIAGNOSIS — I10 Essential (primary) hypertension: Secondary | ICD-10-CM

## 2020-05-19 DIAGNOSIS — I700 Atherosclerosis of aorta: Secondary | ICD-10-CM

## 2020-05-19 DIAGNOSIS — F0280 Dementia in other diseases classified elsewhere without behavioral disturbance: Secondary | ICD-10-CM

## 2020-05-19 DIAGNOSIS — Z8546 Personal history of malignant neoplasm of prostate: Secondary | ICD-10-CM

## 2020-05-19 DIAGNOSIS — F32A Depression, unspecified: Secondary | ICD-10-CM

## 2020-05-19 DIAGNOSIS — N401 Enlarged prostate with lower urinary tract symptoms: Secondary | ICD-10-CM | POA: Diagnosis not present

## 2020-05-19 DIAGNOSIS — G4733 Obstructive sleep apnea (adult) (pediatric): Secondary | ICD-10-CM

## 2020-05-19 DIAGNOSIS — C01 Malignant neoplasm of base of tongue: Secondary | ICD-10-CM

## 2020-05-19 DIAGNOSIS — M1990 Unspecified osteoarthritis, unspecified site: Secondary | ICD-10-CM | POA: Diagnosis not present

## 2020-05-19 DIAGNOSIS — G309 Alzheimer's disease, unspecified: Secondary | ICD-10-CM

## 2020-05-19 DIAGNOSIS — E038 Other specified hypothyroidism: Secondary | ICD-10-CM

## 2020-05-19 DIAGNOSIS — K219 Gastro-esophageal reflux disease without esophagitis: Secondary | ICD-10-CM

## 2020-05-19 DIAGNOSIS — Z8547 Personal history of malignant neoplasm of testis: Secondary | ICD-10-CM

## 2020-05-19 DIAGNOSIS — R338 Other retention of urine: Secondary | ICD-10-CM

## 2020-05-19 NOTE — Progress Notes (Signed)
   Subjective:    Patient ID: [name], male    DOB: 1945/10/26, 74 y.o.   MRN: [id number]  HPI He is here for a med check appointment.  He continues to be followed for his tongue cancer.  Several months ago he was started on levothyroxine due to an elevated TSH secondary to his radiation.  He will need follow-up on that.  He does see neurology as well as psychology.  He seems to doing well on the medications provided by neurology as well as psychiatry.  He does complain of some leg cramping as well as arthralgias.  He does have OSA but is not on CPAP.  He does have a remote history of testicular and prostate cancer.  He also has x-ray evidence of atherosclerosis.  He is having some reflux type symptoms.  His wife has been giving him Pepto-Bismol.   Review of Systems     Objective:   Physical Exam Alert and in no distress. Tympanic membranes and canals are normal. Pharyngeal area is normal. Neck is supple without adenopathy or thyromegaly. Cardiac exam shows a regular sinus rhythm without murmurs or gallops. Lungs are clear to auscultation.        Assessment & Plan:  Benign prostatic hyperplasia with urinary retention  Arthritis  Primary hypertension  Malignant neoplasm of base of tongue (HCC)  Gastroesophageal reflux disease without esophagitis  Aortic atherosclerosis (HCC)  History of testicular cancer  OSA (obstructive sleep apnea)  History of prostate cancer  Hypogonadism in male  Depression, unspecified depression type  Alzheimer's dementia without behavioral disturbance, unspecified timing of dementia onset ([place])  Other specified hypothyroidism - Plan: TSH  He will continue to be followed by neurology, psychiatry and radiation oncology.  No intervention needed for the OSA at the present time.  Recommend the use of Pepcid to see if it will help with his GI symptoms and also recommend that he get the Shingrix vaccine.  Probably readjust his thyroid medicine and  hopefully this will help with the arthralgias and myalgias.

## 2020-05-20 LAB — TSH: TSH: 3.96 u[IU]/mL (ref 0.450–4.500)

## 2020-05-20 MED ORDER — LEVOTHYROXINE SODIUM 50 MCG PO TABS: 50.0000 ug | ORAL_TABLET | Freq: Every day | ORAL | 3 refills | Status: DC

## 2020-05-20 NOTE — Addendum Note (Signed)
Addended by: Denita Lung on: 05/20/2020 08:12 AM   Modules accepted: Orders

## 2020-05-25 ENCOUNTER — Ambulatory Visit
Admission: RE | Admit: 2020-05-25 | Discharge: 2020-05-25 | Disposition: A | Discharge status: Home / Self Care | Payer: Medicare Other | Source: Ambulatory Visit | Attending: Radiation Oncology | Admitting: Radiation Oncology

## 2020-05-25 ENCOUNTER — Other Ambulatory Visit: Payer: Self-pay

## 2020-05-25 ENCOUNTER — Telehealth: Payer: Self-pay | Admitting: *Deleted

## 2020-05-25 VITALS — BP 120/78 | HR 70 | Temp 98.2°F | Resp 18 | Ht 68.0 in | Wt 156.2 lb

## 2020-05-25 DIAGNOSIS — Z923 Personal history of irradiation: Secondary | ICD-10-CM | POA: Insufficient documentation

## 2020-05-25 DIAGNOSIS — C01 Malignant neoplasm of base of tongue: Secondary | ICD-10-CM

## 2020-05-25 DIAGNOSIS — Z85810 Personal history of malignant neoplasm of tongue: Secondary | ICD-10-CM | POA: Insufficient documentation

## 2020-05-25 DIAGNOSIS — M1990 Unspecified osteoarthritis, unspecified site: Secondary | ICD-10-CM

## 2020-05-25 MED ORDER — LARYNGOSCOPY SOLUTION RAD-ONC
15.0000 mL | Freq: Once | TOPICAL | Status: AC
  Administered 2020-05-25: 15 mL via TOPICAL
  Filled 2020-05-25: qty 15

## 2020-05-25 NOTE — Progress Notes (Signed)
[name] presents for follow up of radiation completed on 01/14/2019 to his base of tongue/ head and neck.  Pain issues, if any: Patient reports occasional spasming along surgical incision on right side of neck. He is also dealing with constant joint pain in most of his joints, and nerve pain down his right arm. Using a feeding tube?: N/A Weight changes, if any:  Wt Readings from Last 3 Encounters:  05/25/20 156 lb 3.2 oz (70.9 kg)  05/19/20 157 lb (71.2 kg)  10/24/19 156 lb 12.8 oz (71.1 kg)   Swallowing issues, if any: Patient reports when he is trying to swallow his medications, they often feel like they get stuck on the right side of his throat. He also reports occasionally getting choked up when drinking milk. Patient doesn't feel like he has to avoid any types of food. Smoking or chewing tobacco? None Using fluoride trays daily? Yes Last ENT visit was on: 01/06/2020 Saw Dr. Francina Mathis: "He has done well overall since the last visit.He is having no sore throat. Some infrequent episodes of cramping/stabbing right neck pain, eg when yawning. He is able to swallow most types of foods. No palpable neck masses or enlarged lymph nodes  --Procedure: Flexible fiberoptic laryngoscopy Technique: After anesthetizing the nasal cavity with topical lidocaine and oxymetazoline, the flexible endoscope was introduced and passed through the nasal cavity into the nasopharynx. The scope was then advanced to the level of the oropharynx. The tongue base and vallecula were visualized and appeared healthy without mucosal masses or lesions. The epiglottis, aryepiglottic folds, hypopharynx, supraglottis, glottis were visualized and appeared healthy without mucosal masses or lesions. Vocal fold mobility was intact and symmetric. --There is no evidence of disease on exam today. He sees Dr [name] in October. One-year surveillance CT ordered . I will call with the results of the scan when they are available to me:  [phone] --Return in December, certainly sooner if there are any questions or problems"  Other notable issues, if any:  Patient denies any lymphedema or trouble opening/closing his mouth. Reports he's sleeping well. Other than the ongoing spasming on the right side he's pleased with his recovery this far.   Vitals:   05/25/20 1432  BP: 120/78  Pulse: 70  Resp: 18  Temp: 98.2 F (36.8 C)  SpO2: 99%

## 2020-05-25 NOTE — Telephone Encounter (Signed)
Called patient to alter fu visit for 05-28-20 to 05-25-20 or 05-26-20 per Dr. Isidore Moos request, lvm for a return call

## 2020-05-26 ENCOUNTER — Encounter: Payer: Self-pay | Admitting: Radiation Oncology

## 2020-05-26 NOTE — Progress Notes (Signed)
Radiation Oncology         [phone] ________________________________  Name: [name] MRN: [id number]  Date: 04/11/2019  DOB: 1946-06-14  Follow-Up Visit Note in person  CC: Denita Lung, MD  Leta Baptist, MD  Diagnosis and Prior Radiotherapy:       ICD-10-CM   1. Malignant neoplasm of base of tongue [facility])  C01      Radiation Treatment Dates: 12/02/2018 through 01/14/2019 Site Technique Total Dose Dose per Fx Completed Fx Beam Energies  Head & neck: HN_BOT IMRT 60/60 2 30/30 6X   CHIEF COMPLAINT:  Here for follow-up and surveillance of throat cancer  Narrative:  The patient returns today for routine follow-up   He is here with his wife today.  They both feel that he is doing very well.    Pain issues, if any: Patient reports occasional spasming along surgical incision on right side of neck. He is also dealing with constant joint pain in most of his joints, and nerve pain down his right arm.  He talked to Dr. Nicolette Bang about it and was recommended muscle relaxants but the patient would prefer not to start any new medications.  Using a feeding tube?: N/A Weight changes, if any:  Wt Readings from Last 3 Encounters:  05/25/20 156 lb 3.2 oz (70.9 kg)  05/19/20 157 lb (71.2 kg)  10/24/19 156 lb 12.8 oz (71.1 kg)   Swallowing issues, if any: Sometimes he has issues with pills feeling like they get stuck and sometimes has issues feeling choked up when drinking milk. Patient doesn't feel like he has to avoid any types of food.  Smoking or chewing tobacco? None  Using fluoride trays daily? Yes  Last ENT visit was on: 01/06/2020 Saw Dr. Francina Ames: "He has done well overall since the last visit.He is having no sore throat. Some infrequent episodes of cramping/stabbing right neck pain, eg when yawning. He is able to swallow most types of foods. No palpable neck masses or enlarged lymph nodes  --Procedure: Flexible fiberoptic laryngoscopy Technique: After anesthetizing the nasal  cavity with topical lidocaine and oxymetazoline, the flexible endoscope was introduced and passed through the nasal cavity into the nasopharynx. The scope was then advanced to the level of the oropharynx. The tongue base and vallecula were visualized and appeared healthy without mucosal masses or lesions. The epiglottis, aryepiglottic folds, hypopharynx, supraglottis, glottis were visualized and appeared healthy without mucosal masses or lesions. Vocal fold mobility was intact and symmetric. --There is no evidence of disease on exam today. He sees Dr Isidore Moos in October. One-year surveillance CT ordered . I will call with the results of the scan when they are available to me: [phone] --Return in December, certainly sooner if there are any questions or problems"  Other notable issues, if any:  Patient denies any lymphedema or trouble opening/closing his mouth. Reports he's sleeping well. Other than the ongoing spasming on the right side he's pleased with his recovery this far.   According to report of CT neck and thyroid from June at [facility] he had no evidence of disease  Vitals:   05/25/20 1432  BP: 120/78  Pulse: 70  Resp: 18  Temp: 98.2 F (36.8 C)  SpO2: 99%      ALLERGIES:  has No Known Allergies.  Meds: Current Outpatient Medications  Medication Sig Dispense Refill  . donepezil (ARICEPT) 10 MG tablet Take 1 tablet (10 mg total) by mouth at bedtime. 90 tablet 2  . lidocaine (  XYLOCAINE) 2 % solution Patient: Mix 1part 2% viscous lidocaine, 1part H20. Swish & swallow 39mL of diluted mixture, 79min before meals and at bedtime, up to QID 100 mL 5  . mirtazapine (REMERON SOLTAB) 30 MG disintegrating tablet Take 1 tablet (30 mg total) by mouth at bedtime. 30 tablet 8  . polyethylene glycol powder (GLYCOLAX/MIRALAX) powder Take 17 g by mouth daily. 3350 g 11  . primidone (MYSOLINE) 50 MG tablet Take 1 tablet (50 mg total) by mouth 2 (two) times daily. 180 tablet 2  . rosuvastatin  (CRESTOR) 20 MG tablet TAKE 1 TABLET(20 MG) BY MOUTH DAILY 90 tablet 0  . sodium fluoride (PREVIDENT 5000 PLUS) 1.1 % CREA dental cream Apply cream to tooth brush. Brush teeth for 2 minutes. Spit out excess. DO NOT rinse afterwards. Repeat nightly. 1 Tube prn  . terazosin (HYTRIN) 2 MG capsule TAKE 1 CAPSULE(2 MG) BY MOUTH DAILY 90 capsule 1  . triamcinolone (NASACORT ALLERGY 24HR) 55 MCG/ACT AERO nasal inhaler Place 1 spray into the nose daily.    Marland Kitchen vortioxetine HBr (TRINTELLIX) 20 MG TABS tablet Take 1 tablet (20 mg total) by mouth daily. 90 tablet 1  . HYDROcodone-acetaminophen (NORCO/VICODIN) 5-325 MG tablet Take 1 tablet by mouth every 6 (six) hours as needed for moderate pain. For up to 5 days    . ondansetron (ZOFRAN-ODT) 4 MG disintegrating tablet Take 4 mg by mouth every 8 (eight) hours as needed for nausea.     No current facility-administered medications for this encounter.     Physical Findings: The patient is in no acute distress. Patient is alert and oriented. Vitals:   05/25/20 1432  BP: 120/78  Pulse: 70  Resp: 18  Temp: 98.2 F (36.8 C)  SpO2: 99%   Wt Readings from Last 3 Encounters:  05/25/20 156 lb 3.2 oz (70.9 kg)  05/19/20 157 lb (71.2 kg)  10/24/19 156 lb 12.8 oz (71.1 kg)    General: Alert and oriented, in no acute distress HEENT: Head is normocephalic.  Oropharynx is notable for clear mucosa, no tumor  Neck: Neck is notable for no palpable masses; there is some concavity in the subcutaneous tissue of his upper right neck where he states that he feels spasming at times.  No lymphedema in the neck Skin: Skin in treatment fields shows satisfactory healing  Lymphatics: see Neck Exam Psychiatric: Affect is appropriate. Heart: RRR Chest: CTAB  PROCEDURE NOTE: After obtaining consent and anesthetizing the nasal cavity with topical lidocaine and phenylephrine, the flexible endoscope was introduced and passed through the nasal cavity.  Mild erythema noted in the  nasopharynx without any ulceration or masses. Left base of tongue tissue is fuller than right base of tongue without any ulceration or masses; not of clinical concern.  No lesions in the hypopharynx, supraglottis, or glottis.  True cords are symmetrically mobile.  Patient tolerated the procedure well..    Lab Findings:   Lab Results  Component Value Date   TSH 3.960 05/19/2020    Radiographic Findings: No new studies  Impression/Plan:    1) Head and Neck Cancer Status:   no evidence of disease  He has some fullness in the left base of tongue compared to the right base of tongue which is consistent with my review of the CT of his neck performed last year. This asymmetry is secondary to treatment effects.  2) Nutritional Status: No issues, PEG removed.    3) Swallowing: Good function, with some mild occasional issues. Continue  swallowing exercises per [facility] speech-language pathology   4) Dental: Encouraged to continue regular followup with dentistry, and dental hygiene including fluoride treatment.     5) Thyroid function:   WNL this month Lab Results  Component Value Date   TSH 3.960 05/19/2020    6) Other: He has follow-up with [facility]NT in 2 months    I discussed the head and neck survivorship program that we have here at the cancer center and the patient would like to begin following with that.  He will meet  Sandi Mealy in 4 months and see me back in 8 months.   The patient was encouraged to call with any issues or questions before then.   On date of service, in total, I spent 30 minutes on this encounter.  Patient was seen in person. _____________________________________   Eppie Gibson, MD

## 2020-05-28 ENCOUNTER — Ambulatory Visit: Payer: Self-pay | Admitting: Radiation Oncology

## 2020-06-14 ENCOUNTER — Other Ambulatory Visit: Payer: Self-pay | Admitting: Neurology

## 2020-06-17 ENCOUNTER — Ambulatory Visit: Payer: Medicare Other | Attending: Radiation Oncology | Admitting: Physical Therapy

## 2020-06-17 ENCOUNTER — Other Ambulatory Visit: Payer: Self-pay

## 2020-06-17 ENCOUNTER — Encounter: Payer: Self-pay | Admitting: Physical Therapy

## 2020-06-17 DIAGNOSIS — M25531 Pain in right wrist: Secondary | ICD-10-CM | POA: Insufficient documentation

## 2020-06-17 DIAGNOSIS — L599 Disorder of the skin and subcutaneous tissue related to radiation, unspecified: Secondary | ICD-10-CM | POA: Diagnosis present

## 2020-06-17 DIAGNOSIS — M79605 Pain in left leg: Secondary | ICD-10-CM | POA: Insufficient documentation

## 2020-06-17 DIAGNOSIS — M542 Cervicalgia: Secondary | ICD-10-CM

## 2020-06-17 DIAGNOSIS — R293 Abnormal posture: Secondary | ICD-10-CM | POA: Insufficient documentation

## 2020-06-17 NOTE — Therapy (Signed)
[place], [place], [address] Phone: [phone]   Fax:  [phone]  Physical Therapy Treatment  Patient Details  Name: [name] MRN: [id number] Date of Birth: [birth year] Referring Provider (PT): Reita May Date: 06/17/2020   PT End of Session - 06/17/20 0958    Visit Number 1    Number of Visits 9    Date for PT Re-Evaluation 08/05/20   pt will not start until week of 11/22 due to scheduling   PT Start Time 0906    PT Stop Time 0953    PT Time Calculation (min) 47 min    Activity Tolerance Patient tolerated treatment well    Behavior During Therapy [facility] for tasks assessed/performed           Past Medical History:  Diagnosis Date  . Allergy   . Alzheimer disease ([place]) 06/2018   diagnosed with early stage alzheimers  . Arthritis    osteoarthritis  . Cancer [facility])    testicular, age 74, orchiectomy 74  . Cataract    B/L CATARACTS NO SURGERY  . Dementia ([place])   . Depression   . Dyslipidemia   . Heartburn    TAKES ZANTAC  . History of radiation therapy 12/02/18- 01/14/19   Head and neck/ base of tongue. 60 Gy over 30 fractions.   . Hypertension   . Hypogonadism male   . Prostate cancer ([place]) 06/07/11   gleason 3+3=6, vol 59.5 cc  . Sleep apnea   . Status post chemotherapy    testicular cancer 1992, Dr Beryle Beams    Past Surgical History:  Procedure Laterality Date  . APPENDECTOMY    . COLONOSCOPY  2007   gessner  . DIRECT LARYNGOSCOPY N/A 08/19/2018   Procedure: DIRECT LARYNGOSCOPY;  Surgeon: Leta Baptist, MD;  Location: [place];  Service: ENT;  Laterality: N/A;  . INGUINAL HERNIA REPAIR     w/orchiectomy 1992, retroperitoneal lymph node dissection  . IR GASTROSTOMY TUBE REMOVAL  02/28/2019  . MENISECTOMY     R&L knee surgeries  . PROSTATE SURGERY     biopsy x 2  . TONGUE BIOPSY N/A 08/19/2018   Procedure: BIOPSY OF TONGUE BASE MASS;  Surgeon: Leta Baptist, MD;  Location: [place];  Service: ENT;  Laterality: N/A;    There were no vitals filed for this visit.   Subjective Assessment - 06/17/20 0917    Subjective I have pain in all my joints. My wrist hurts when I move it in certain directions. I have pain at my incision site. I have a hard time getting my legs in and out of the car.    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  No    Is the patient reluctant to leave their home because of a fear of falling?  No      Home Ecologist residence    Living Arrangements Spouse/significant other    Available Help at Discharge Family    Type of [place]      Prior Function   Level of  [place] with basic ADLs   needs assist with cleaning due to [place] Ret[employer]    Leisure pt does not exercise      Cognition   Overall Cognitive Status History of cognitive impairments - at baseline   pt has early onset Alzheimers     Posture/Postural Control   Posture/Postural Control Postural limitations    Postural Limitations Rounded Shoulders;Forward head      ROM / Strength   AROM / PROM / Strength Strength      AROM   Overall AROM  Within functional limits for tasks performed      Strength   Overall Strength Within functional limits for tasks performed    Overall Strength Comments LE grossly 5/5 throughout hip/ankle/knee      Special Tests   Other special tests 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                         [facility] Adult PT Treatment/Exercise - 06/17/20 0001      Exercises   Exercises Neck      Neck Exercises: Seated   Other Seated Exercise with over pressure on right collar bone- left rotation and slight extension                   PT Education - 06/17/20 0954    Education Details purchase wrist brace - issued picture to help decrease wrist tenosynovitis, ice wrist for 15 min 2x/day, do neck stretch with overpressure at collar bone, remove rug from under chairs at table to allow pt to stand up and push chair back more easily    Person(s) Educated Patient;Spouse    Methods Explanation;Demonstration;Verbal cues;Handout    Comprehension Verbalized understanding;Returned demonstration               PT Long Term Goals - 06/17/20 1008      PT LONG TERM GOAL #1   Title Pt will be able to tranfer in and out of car without difficulty.    Baseline pt has difficulty getting his leg in the car    Time 4   starting 11/22   Period Weeks    Status New    Target Date 08/05/20  PT LONG TERM GOAL #2   Title Pt will report no more spasms in his neck which cause increased pain to allow improved  comfort.    Time 4   starting week of 11/22   Period Weeks    Status New    Target Date 08/05/20      PT LONG TERM GOAL #3   Title Pt will be independent in a home exercise program for continued strengthening and stretching    Time 4   starting week of 11/22   Period Weeks    Status New    Target Date 08/05/20      PT LONG TERM GOAL #4   Title Pt will report a 75% improvement in R wrist discomfort as evidenced by negative test for de quervains.    Time 4   starting week of 11/22   Period Weeks    Status New    Target Date 08/05/20                 Plan - 06/17/20 0959    Clinical Impression Statement Pt presents to PT with generalized joint pain and increased right wrist pain. He had a positive test for de Quervains tenosynovitis. Educated pt and spouse on appropriate brace to purchase and to begin wearing this during the day and icing for 15 min at least 2x/day to decrease inflammation. Pt also has had spasms in his neck in the area of his scar that cause him to scream out due to the pain. There is increased tightness upon palpation in this area. Educated pt in neck stretch to begin doing at home. Pt's spouse reports he has difficulty getting in and out of the car and standing up from a chair. His LE weakness is grossly 5/5 so some of this may be due processing issues as a result of his Alzheimers. Will assess pt's transfers in and out of car at next session. Overall pt is very sedentary at home and would benefit from an HEP to help decrease joint pain. Pt would benefit from skilled PT services to help decrease joint pain through exercise for improving mobility, decrease neck tightness and decrease wrist pain.    Personal Factors and Comorbidities Comorbidity 1;Comorbidity 2;Comorbidity 3+    Comorbidities Alzheimer's, current prostate cancer, hx of testicular cancer    Examination-Activity Limitations Transfers;Sleep    Stability/Clinical Decision Making Evolving/Moderate  complexity    Clinical Decision Making Moderate    Rehab Potential Good    PT Frequency 2x / week    PT Duration 4 weeks   starting 11/22   PT Treatment/Interventions ADLs/Self Care Home Management;Therapeutic activities;Therapeutic exercise;Patient/family education;Manual techniques;Passive range of motion;Scar mobilization;Taping;Neuromuscular re-education    PT Next Visit Plan see if brace helped, see if ice helped, see if stretch helped, begin massage to neck in area of tightness and instruct wife    PT Home Exercise Plan purchase brace and use daily, ice wrist daily, do neck stretch    Consulted and Agree with Plan of Care Patient    Family Member Consulted wife           Patient will benefit from skilled therapeutic intervention in order to improve the following deficits and impairments:  Decreased range of motion, Decreased scar mobility, Decreased strength, Increased fascial restricitons, Postural dysfunction, Pain, Decreased mobility  Visit Diagnosis: Cervicalgia  Pain in left leg  Pain in right wrist  Disorder of the skin and subcutaneous tissue related to radiation, unspecified  Abnormal  posture     Problem List Patient Active Problem List   Diagnosis Date Noted  . GERD (gastroesophageal reflux disease) 10/16/2018  . Malignant neoplasm of base of tongue ([place]) 09/27/2018  . Aortic atherosclerosis ([place]) 08/08/2018  . History of testicular cancer 08/08/2018  . Alzheimer's dementia ([place]) 09/11/2016  . Essential tremor 09/11/2016  . Hyperreflexia 06/08/2016  . Family history of heart disease in male family member before age 37 08/31/2015  . Depression 10/19/2014  . OSA (obstructive sleep apnea) 08/24/2014  . Hypertension   . Arthritis   . History of prostate cancer 06/07/2011  . Hyperlipidemia 12/28/2010  . Benign prostatic hyperplasia with urinary retention 12/28/2010  . Hypogonadism in male 12/28/2010    [place] 06/17/2020, 10:11 AM  [place], [place], [address] Phone: [phone]   Fax:  [phone]  Name: [name] MRN: [id number] Date of Birth: 10-Dec-1945  Manus Gunning, PT 06/17/20 10:11 AM

## 2020-07-06 ENCOUNTER — Ambulatory Visit: Payer: Medicare Other | Admitting: Physical Therapy

## 2020-07-06 ENCOUNTER — Encounter: Payer: Self-pay | Admitting: Physical Therapy

## 2020-07-06 ENCOUNTER — Other Ambulatory Visit: Payer: Self-pay

## 2020-07-06 DIAGNOSIS — M542 Cervicalgia: Secondary | ICD-10-CM

## 2020-07-06 DIAGNOSIS — M79605 Pain in left leg: Secondary | ICD-10-CM

## 2020-07-06 NOTE — Therapy (Signed)
[place], [place], [address] Phone: [phone]   Fax:  [phone]  Physical Therapy Treatment  Patient Details  Name: [name] MRN: [id number] Date of Birth: October 09, 1945 Referring Provider (PT): Reita May Date: 07/06/2020   PT End of Session - 07/06/20 1402    Visit Number 2    Number of Visits 9    Date for PT Re-Evaluation 08/05/20    PT Start Time 4944    PT Stop Time 1401    PT Time Calculation (min) 56 min    Activity Tolerance Patient tolerated treatment well    Behavior During Therapy [facility] for tasks assessed/performed           Past Medical History:  Diagnosis Date  . Allergy   . Alzheimer disease ([place]) 06/2018   diagnosed with early stage alzheimers  . Arthritis    osteoarthritis  . Cancer [facility])    testicular, age 74, orchiectomy 50  . Cataract    B/L CATARACTS NO SURGERY  . Dementia ([place])   . Depression   . Dyslipidemia   . Heartburn    TAKES ZANTAC  . History of radiation therapy 12/02/18- 01/14/19   Head and neck/ base of tongue. 60 Gy over 30 fractions.   . Hypertension   . Hypogonadism male   . Prostate cancer ([place]) 06/07/11   gleason 3+3=6, vol 59.5 cc  . Sleep apnea   . Status post chemotherapy    testicular cancer 1992, Dr Beryle Beams    Past Surgical History:  Procedure Laterality Date  . APPENDECTOMY    . COLONOSCOPY  2007   gessner  . DIRECT LARYNGOSCOPY N/A 08/19/2018   Procedure: DIRECT LARYNGOSCOPY;  Surgeon: Leta Baptist, MD;  Location: [place];  Service: ENT;  Laterality: N/A;  . INGUINAL HERNIA REPAIR     w/orchiectomy 1992, retroperitoneal lymph node dissection  . IR GASTROSTOMY TUBE REMOVAL  02/28/2019  . MENISECTOMY     R&L knee surgeries  . PROSTATE SURGERY     biopsy x 2  . TONGUE BIOPSY N/A 08/19/2018   Procedure: BIOPSY OF TONGUE BASE MASS;  Surgeon: Leta Baptist, MD;  Location: [place];  Service:  ENT;  Laterality: N/A;    There were no vitals filed for this visit.   Subjective Assessment - 07/06/20 1305    Subjective Have been wearing the splint at night all night. There is improvement in the wrist. Have not done any icing.    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 No/denies    Pain Score 0-No pain                             [facility] Adult PT Treatment/Exercise - 07/06/20 0001      Exercises   Exercises Knee/Hip      Knee/Hip Exercises: Stretches   Piriformis Stretch 20 seconds   attempted in sitting and supine and pt did not feel a stretc   Piriformis Stretch Limitations pt did not feel a stretch with either piriformis stretch       Knee/Hip Exercises: Standing  Hip ADduction AROM;Both;1 set;10 reps   pt returned therapist demo, counter for support   Wall Squat 10 reps    Wall Squat Limitations attempted this in standing today with mat table behind but pt had trouble following directions and his knees were going over his toes and he was leaning too far forward- substituted wall taps instead    Other Standing Knee Exercises wall taps x 10 - pt required verbal cues for correct form      Manual Therapy   Manual Therapy Soft tissue mobilization    Soft tissue mobilization in supine to area just under right mandible where pt has increased tightness and along scar line where pt has increased tenderness                  PT Education - 07/06/20 1416    Education Details instructed pt in HEP today and educated pt on importance of compliance with exercises    Person(s) Educated Patient;Spouse    Methods Explanation;Handout    Comprehension  Verbalized understanding;Returned demonstration;Verbal cues required;Tactile cues required               PT Long Term Goals - 06/17/20 1008      PT LONG TERM GOAL #1   Title Pt will be able to tranfer in and out of car without difficulty.    Baseline pt has difficulty getting his leg in the car    Time 4   starting 11/22   Period Weeks    Status New    Target Date 08/05/20      PT LONG TERM GOAL #2   Title Pt will report no more spasms in his neck which cause increased pain to allow improved comfort.    Time 4   starting week of 11/22   Period Weeks    Status New    Target Date 08/05/20      PT LONG TERM GOAL #3   Title Pt will be independent in a home exercise program for continued strengthening and stretching    Time 4   starting week of 11/22   Period Weeks    Status New    Target Date 08/05/20      PT LONG TERM GOAL #4   Title Pt will report a 75% improvement in R wrist discomfort as evidenced by negative test for de quervains.    Time 4   starting week of 11/22   Period Weeks    Status New    Target Date 08/05/20                 Plan - 07/06/20 1341    Clinical Impression Statement Began instructing pt in a home exercise program incluing wall taps, hip abduction and bridging. Pt has been having pain in his L upper thigh that is not reproduceable to palpation and also no always reproducable with movement. The thigh can be a referral site for SI joint dysfunction so added exercises today to help with any SI joint dysfunction that will also improve core and LE strength. Instructed pt in mini squats but pt was having trouble with these due to processing issues so added wall taps instead. Pt reports his wrist pain has improved since he began wearing the brace after last session. Ended session with soft tissue mobilization to right side of neck in area of discomfort.    Comorbidities Alzheimer's, current prostate cancer, hx of testicular cancer    Rehab Potential  Good  PT Frequency 2x / week    PT Duration 4 weeks    PT Treatment/Interventions ADLs/Self Care Home Management;Therapeutic activities;Therapeutic exercise;Patient/family education;Manual techniques;Passive range of motion;Scar mobilization;Taping;Neuromuscular re-education    PT Next Visit Plan see if brace helped, see if ice helped, see if stretch helped, begin massage to neck in area of tightness and instruct wife    PT Home Exercise Plan purchase brace and use daily, ice wrist daily, do neck stretch, Access Code: [email or web address]    Consulted and Agree with Plan of Care Patient    Family Member Consulted wife           Patient will benefit from skilled therapeutic intervention in order to improve the following deficits and impairments:  Decreased range of motion, Decreased scar mobility, Decreased strength, Increased fascial restricitons, Postural dysfunction, Pain, Decreased mobility  Visit Diagnosis: Pain in left leg  Cervicalgia     Problem List Patient Active Problem List   Diagnosis Date Noted  . GERD (gastroesophageal reflux disease) 10/16/2018  . Malignant neoplasm of base of tongue ([place]) 09/27/2018  . Aortic atherosclerosis ([place]) 08/08/2018  . History of testicular cancer 08/08/2018  . Alzheimer's dementia ([place]) 09/11/2016  . Essential tremor 09/11/2016  . Hyperreflexia 06/08/2016  . Family history of heart disease in male family member before age 18 08/31/2015  . Depression 10/19/2014  . OSA (obstructive sleep apnea) 08/24/2014  . Hypertension   . Arthritis   . History of prostate cancer 06/07/2011  . Hyperlipidemia 12/28/2010  . Benign prostatic hyperplasia with urinary retention 12/28/2010  . Hypogonadism in male 12/28/2010    Allyson Sabal [facility] 07/06/2020, 2:17 PM  [place], [place], [address] Phone: [phone]   Fax:  [phone]  Name: [name] MRN:  [id number] Date of Birth: [birth year]  Manus Gunning, PT 07/06/20 2:17 PM

## 2020-07-06 NOTE — Patient Instructions (Signed)
Access Code: [email or web address] URL: [email or web address] Date: 07/06/2020 Prepared by: Manus Gunning  Exercises Standing Hip Abduction with Counter Support - 1 x daily - 7 x weekly - 1 sets - 10 reps Supine Bridge - 1 x daily - 7 x weekly - 1 sets - 10 reps - 5 sec hold Mini Squat - 1 x daily - 7 x weekly - 1 sets - 10 reps

## 2020-07-12 ENCOUNTER — Other Ambulatory Visit: Payer: Self-pay

## 2020-07-12 ENCOUNTER — Ambulatory Visit: Payer: Medicare Other | Attending: Radiation Oncology | Admitting: Physical Therapy

## 2020-07-12 ENCOUNTER — Encounter: Payer: Self-pay | Admitting: Physical Therapy

## 2020-07-12 DIAGNOSIS — L599 Disorder of the skin and subcutaneous tissue related to radiation, unspecified: Secondary | ICD-10-CM | POA: Diagnosis present

## 2020-07-12 DIAGNOSIS — M542 Cervicalgia: Secondary | ICD-10-CM | POA: Diagnosis not present

## 2020-07-12 DIAGNOSIS — R293 Abnormal posture: Secondary | ICD-10-CM | POA: Diagnosis present

## 2020-07-12 DIAGNOSIS — M79605 Pain in left leg: Secondary | ICD-10-CM | POA: Insufficient documentation

## 2020-07-12 NOTE — Therapy (Signed)
[name], [place], [address] Phone: [phone]   Fax:  [phone]  Physical Therapy Treatment  Patient Details  Name: [name] MRN: [id number] Date of Birth: July 27, 1946 Referring Provider (PT): Reita May Date: 07/12/2020   PT End of Session - 07/12/20 1194    Visit Number 3    Number of Visits 9    Date for PT Re-Evaluation 08/05/20    PT Start Time 1740    PT Stop Time 1355    PT Time Calculation (min) 50 min    Activity Tolerance Patient tolerated treatment well    Behavior During Therapy [facility] for tasks assessed/performed           Past Medical History:  Diagnosis Date  . Allergy   . Alzheimer disease ([place]) 06/2018   diagnosed with early stage alzheimers  . Arthritis    osteoarthritis  . Cancer [facility])    testicular, age 64, orchiectomy 89  . Cataract    B/L CATARACTS NO SURGERY  . Dementia ([place])   . Depression   . Dyslipidemia   . Heartburn    TAKES ZANTAC  . History of radiation therapy 12/02/18- 01/14/19   Head and neck/ base of tongue. 60 Gy over 30 fractions.   . Hypertension   . Hypogonadism male   . Prostate cancer ([place]) 06/07/11   gleason 3+3=6, vol 59.5 cc  . Sleep apnea   . Status post chemotherapy    testicular cancer 1992, Dr Beryle Beams    Past Surgical History:  Procedure Laterality Date  . APPENDECTOMY    . COLONOSCOPY  2007   gessner  . DIRECT LARYNGOSCOPY N/A 08/19/2018   Procedure: DIRECT LARYNGOSCOPY;  Surgeon: Leta Baptist, MD;  Location: [place];  Service: ENT;  Laterality: N/A;  . INGUINAL HERNIA REPAIR     w/orchiectomy 1992, retroperitoneal lymph node dissection  . IR GASTROSTOMY TUBE REMOVAL  02/28/2019  . MENISECTOMY     R&L knee surgeries  . PROSTATE SURGERY     biopsy x 2  . TONGUE BIOPSY N/A 08/19/2018   Procedure: BIOPSY OF TONGUE BASE MASS;  Surgeon: Leta Baptist, MD;  Location: [place];  Service:  ENT;  Laterality: N/A;    There were no vitals filed for this visit.   Subjective Assessment - 07/12/20 1306    Subjective I think the exercises have been going ok. My leg pain is still there but it comes and goes.    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 Other (Comment)   pt reports stiffness in his neck                            [facility] Adult PT Treatment/Exercise - 07/12/20 0001      Knee/Hip Exercises: Aerobic   Stationary Bike Level 1 x 6 min with pt having some right knee pain that improved when    pts knee pain improved with v/c to keep knee in neutral posi     Manual Therapy   Soft tissue mobilization in supine to area just under right mandible where  pt has increased tightness and along scar line where pt has increased tenderness, instructed pt throughout in correct technique and had him return demonstrate                       PT Long Term Goals - 06/17/20 1008      PT LONG TERM GOAL #1   Title Pt will be able to tranfer in and out of car without difficulty.    Baseline pt has difficulty getting his leg in the car    Time 4   starting 11/22   Period Weeks    Status New    Target Date 08/05/20      PT LONG TERM GOAL #2   Title Pt will report no more spasms in his neck which cause increased pain to allow improved comfort.    Time 4   starting week of 11/22   Period Weeks    Status New    Target Date 08/05/20      PT LONG TERM GOAL #3   Title Pt will be independent in a home exercise program for continued strengthening and stretching    Time 4   starting week of 11/22   Period Weeks    Status New    Target Date 08/05/20       PT LONG TERM GOAL #4   Title Pt will report a 75% improvement in R wrist discomfort as evidenced by negative test for de quervains.    Time 4   starting week of 11/22   Period Weeks    Status New    Target Date 08/05/20                 Plan - 07/12/20 1357    Clinical Impression Statement Pt has been compliant with his home exercise program and he reports improvement with his left thigh pain. Pt is still having increased tightness in his neck especially the right side. Continued with soft tissue mobilization to this area and instructed pt in proper technique and had him return demonstrate. Added stationary bike today for LE strengthening.    PT Frequency 2x / week    PT Duration 4 weeks    PT Treatment/Interventions ADLs/Self Care Home Management;Therapeutic activities;Therapeutic exercise;Patient/family education;Manual techniques;Passive range of motion;Scar mobilization;Taping;Neuromuscular re-education    PT Next Visit Plan cont massage to neck in area of tightness and instruct wife and pt, continue LE ex    PT Home Exercise Plan purchase brace and use daily, ice wrist daily, do neck stretch, Access Code: [email or web address]    Consulted and Agree with Plan of Care Patient    Family Member Consulted wife           Patient will benefit from skilled therapeutic intervention in order to improve the following deficits and impairments:  Decreased range of motion, Decreased scar mobility, Decreased strength, Increased fascial restricitons, Postural dysfunction, Pain, Decreased mobility  Visit Diagnosis: Cervicalgia  Pain in left leg     Problem List Patient Active Problem List   Diagnosis Date Noted  . GERD (gastroesophageal reflux disease) 10/16/2018  . Malignant neoplasm of base of tongue ([place]) 09/27/2018  . Aortic atherosclerosis ([place]) 08/08/2018  . History of testicular cancer 08/08/2018  . Alzheimer's dementia ([place]) 09/11/2016  . Essential tremor 09/11/2016  . Hyperreflexia  06/08/2016  . Family history of heart disease in male family member before age 52 08/31/2015  . Depression 10/19/2014  . OSA (obstructive sleep apnea)  08/24/2014  . Hypertension   . Arthritis   . History of prostate cancer 06/07/2011  . Hyperlipidemia 12/28/2010  . Benign prostatic hyperplasia with urinary retention 12/28/2010  . Hypogonadism in male 12/28/2010    Allyson Sabal [facility] 07/12/2020, 2:00 PM  [place], [place], [address] Phone: [phone]   Fax:  [phone]  Name: SEVERIANO UTSEY MRN: [id number] Date of Birth: [birth year]  Manus Gunning, PT 07/12/20 2:00 PM

## 2020-07-13 ENCOUNTER — Other Ambulatory Visit: Payer: Self-pay | Admitting: Neurology

## 2020-07-14 ENCOUNTER — Encounter: Payer: Self-pay | Admitting: Physical Therapy

## 2020-07-14 ENCOUNTER — Other Ambulatory Visit: Payer: Self-pay

## 2020-07-14 ENCOUNTER — Ambulatory Visit: Payer: Medicare Other | Admitting: Physical Therapy

## 2020-07-14 DIAGNOSIS — M542 Cervicalgia: Secondary | ICD-10-CM

## 2020-07-14 NOTE — Therapy (Signed)
[place], [place], [address] Phone: [phone]   Fax:  [phone]  Physical Therapy Treatment  Patient Details  Name: [name] MRN: [id number] Date of Birth: [birth year] Referring Provider (PT): Reita May Date: 07/14/2020   PT End of Session - 07/14/20 1358    Visit Number 4    Number of Visits 9    Date for PT Re-Evaluation 08/05/20    PT Start Time 0947    PT Stop Time 1356    PT Time Calculation (min) 51 min    Activity Tolerance Patient tolerated treatment well    Behavior During Therapy [facility] for tasks assessed/performed           Past Medical History:  Diagnosis Date  . Allergy   . Alzheimer disease ([place]) 06/2018   diagnosed with early stage alzheimers  . Arthritis    osteoarthritis  . Cancer [facility])    testicular, age 77, orchiectomy 51  . Cataract    B/L CATARACTS NO SURGERY  . Dementia ([place])   . Depression   . Dyslipidemia   . Heartburn    TAKES ZANTAC  . History of radiation therapy 12/02/18- 01/14/19   Head and neck/ base of tongue. 60 Gy over 30 fractions.   . Hypertension   . Hypogonadism male   . Prostate cancer ([place]) 06/07/11   gleason 3+3=6, vol 59.5 cc  . Sleep apnea   . Status post chemotherapy    testicular cancer 1992, Dr Beryle Beams    Past Surgical History:  Procedure Laterality Date  . APPENDECTOMY    . COLONOSCOPY  2007   gessner  . DIRECT LARYNGOSCOPY N/A 08/19/2018   Procedure: DIRECT LARYNGOSCOPY;  Surgeon: Leta Baptist, MD;  Location: [place];  Service: ENT;  Laterality: N/A;  . INGUINAL HERNIA REPAIR     w/orchiectomy 1992, retroperitoneal lymph node dissection  . IR GASTROSTOMY TUBE REMOVAL  02/28/2019  . MENISECTOMY     R&L knee surgeries  . PROSTATE SURGERY     biopsy x 2  . TONGUE BIOPSY N/A 08/19/2018   Procedure: BIOPSY OF TONGUE BASE MASS;  Surgeon: Leta Baptist, MD;  Location: [place];  Service:  ENT;  Laterality: N/A;    There were no vitals filed for this visit.   Subjective Assessment - 07/14/20 1308    Subjective The exercises are going well. The neck feels less swollen than it has been. He was able to massage away a spasm.    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 No/denies    Pain Score 0-No pain                             [facility] Adult PT Treatment/Exercise - 07/14/20 0001      Manual Therapy   Soft tissue mobilization in supine to bilateral neck with focus on R side to area just under mandible where pt has tight scar band and along bilateral SCMs- had pt turn to look up and over opposite shoulder while applying gentle pressure to collar bone to increase stretch  PT Long Term Goals - 06/17/20 1008      PT LONG TERM GOAL #1   Title Pt will be able to tranfer in and out of car without difficulty.    Baseline pt has difficulty getting his leg in the car    Time 4   starting 11/22   Period Weeks    Status New    Target Date 08/05/20      PT LONG TERM GOAL #2   Title Pt will report no more spasms in his neck which cause increased pain to allow improved comfort.    Time 4   starting week of 11/22   Period Weeks    Status New    Target Date 08/05/20      PT LONG TERM GOAL #3   Title Pt will be independent in a home exercise program for continued strengthening and stretching    Time 4   starting week of 11/22   Period Weeks    Status New    Target Date 08/05/20      PT LONG TERM GOAL #4   Title Pt will report a 75% improvement in R wrist discomfort as evidenced by negative test for de quervains.    Time 4    starting week of 11/22   Period Weeks    Status New    Target Date 08/05/20                 Plan - 07/14/20 1358    Clinical Impression Statement Pt continues to be compliant with his HEP. He is not having any leg pain. Due to his diagnosis of Alzheimer's have not continued to add to HEP since he will be less likely to exercise if he has too many exercises. Continued to focus on soft tissue mobilization to right neck to decrease tightness and spasms in this area. Pt reports he did self massage during his last spasm and it helped.    PT Frequency 2x / week    PT Duration 4 weeks    PT Treatment/Interventions ADLs/Self Care Home Management;Therapeutic activities;Therapeutic exercise;Patient/family education;Manual techniques;Passive range of motion;Scar mobilization;Taping;Neuromuscular re-education    PT Next Visit Plan cont massage to neck in area of tightness and instruct wife and pt, continue LE ex    PT Home Exercise Plan purchase brace and use daily, ice wrist daily, do neck stretch, Access Code: [email or web address]    Consulted and Agree with Plan of Care Patient           Patient will benefit from skilled therapeutic intervention in order to improve the following deficits and impairments:  Decreased range of motion, Decreased scar mobility, Decreased strength, Increased fascial restricitons, Postural dysfunction, Pain, Decreased mobility  Visit Diagnosis: Cervicalgia     Problem List Patient Active Problem List   Diagnosis Date Noted  . GERD (gastroesophageal reflux disease) 10/16/2018  . Malignant neoplasm of base of tongue ([place]) 09/27/2018  . Aortic atherosclerosis ([place]) 08/08/2018  . History of testicular cancer 08/08/2018  . Alzheimer's dementia ([place]) 09/11/2016  . Essential tremor 09/11/2016  . Hyperreflexia 06/08/2016  . Family history of heart disease in male family member before age 40 08/31/2015  . Depression 10/19/2014  . OSA (obstructive sleep apnea) 08/24/2014   . Hypertension   . Arthritis   . History of prostate cancer 06/07/2011  . Hyperlipidemia 12/28/2010  . Benign prostatic hyperplasia with urinary retention 12/28/2010  . Hypogonadism in male 12/28/2010    Allyson Sabal [facility] 07/14/2020,  2:02 PM  [place], [place], [address] Phone: [phone]   Fax:  [phone]  Name: JAQUELL SEDDON MRN: [id number] Date of Birth: 10-Mar-1946  Manus Gunning, PT 07/14/20 2:02 PM

## 2020-07-19 ENCOUNTER — Other Ambulatory Visit: Payer: Self-pay

## 2020-07-19 ENCOUNTER — Encounter: Payer: Self-pay | Admitting: Physical Therapy

## 2020-07-19 ENCOUNTER — Ambulatory Visit: Payer: Medicare Other | Admitting: Physical Therapy

## 2020-07-19 DIAGNOSIS — L599 Disorder of the skin and subcutaneous tissue related to radiation, unspecified: Secondary | ICD-10-CM

## 2020-07-19 DIAGNOSIS — M542 Cervicalgia: Secondary | ICD-10-CM

## 2020-07-19 DIAGNOSIS — M79605 Pain in left leg: Secondary | ICD-10-CM

## 2020-07-19 NOTE — Therapy (Signed)
[place], [place], [address] Phone: [phone]   Fax:  [phone]  Physical Therapy Treatment  Patient Details  Name: [name] MRN: [id number] Date of Birth: 25-Feb-1946 Referring Provider (PT): Reita May Date: 07/19/2020   PT End of Session - 07/19/20 1358    Visit Number 5    Number of Visits 9    Date for PT Re-Evaluation 08/05/20    PT Start Time 6644    PT Stop Time 1355    PT Time Calculation (min) 50 min    Activity Tolerance Patient tolerated treatment well    Behavior During Therapy [facility] for tasks assessed/performed           Past Medical History:  Diagnosis Date  . Allergy   . Alzheimer disease ([place]) 06/2018   diagnosed with early stage alzheimers  . Arthritis    osteoarthritis  . Cancer [facility])    testicular, age 60, orchiectomy 24  . Cataract    B/L CATARACTS NO SURGERY  . Dementia ([place])   . Depression   . Dyslipidemia   . Heartburn    TAKES ZANTAC  . History of radiation therapy 12/02/18- 01/14/19   Head and neck/ base of tongue. 60 Gy over 30 fractions.   . Hypertension   . Hypogonadism male   . Prostate cancer ([place]) 06/07/11   gleason 3+3=6, vol 59.5 cc  . Sleep apnea   . Status post chemotherapy    testicular cancer 1992, Dr Beryle Beams    Past Surgical History:  Procedure Laterality Date  . APPENDECTOMY    . COLONOSCOPY  2007   gessner  . DIRECT LARYNGOSCOPY N/A 08/19/2018   Procedure: DIRECT LARYNGOSCOPY;  Surgeon: Leta Baptist, MD;  Location: [place];  Service: ENT;  Laterality: N/A;  . INGUINAL HERNIA REPAIR     w/orchiectomy 1992, retroperitoneal lymph node dissection  . IR GASTROSTOMY TUBE REMOVAL  02/28/2019  . MENISECTOMY     R&L knee surgeries  . PROSTATE SURGERY     biopsy x 2  . TONGUE BIOPSY N/A 08/19/2018   Procedure: BIOPSY OF TONGUE BASE MASS;  Surgeon: Leta Baptist, MD;  Location: [place];  Service:  ENT;  Laterality: N/A;    There were no vitals filed for this visit.   Subjective Assessment - 07/19/20 1307    Subjective Neck seems to be tighter than usual. The tightness is now on both sides. Pt's wife reports he was having difficulty standing up this morning.    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 No/denies    Pain Score 0-No pain                             [facility] Adult PT Treatment/Exercise - 07/19/20 0001      Knee/Hip Exercises: Stretches   Active Hamstring Stretch Both;2 reps;30 seconds   in sitting- pt returned therapist Occupational hygienist Both;4 reps;30 seconds   with support at [employer] Limitations pt returned therapist demo, educated pt and spouse that pt needs to keep back straight  throughout      Knee/Hip Exercises: Seated   Sit to Sand 5 reps;without UE support      Manual Therapy   Soft tissue mobilization in supine to bilateral neck with focus on R side to area just under mandible where pt has tight scar band, did feel small "bean like" lump on left side of neck and educated pt and wife to follow up with dr regarding this                  PT Education - 07/19/20 1401    Education Details noticed pt was wearing shoes that were coming unglued and not providing adequate support- educated pt on importance of supportive shoes, assessed standing alignement with shoes and socks off and pt arches are flat and pt has pronation, educated pt on how proper alignment of feet affect his whole body    Person(s) Educated Patient;Spouse    Methods Explanation    Comprehension Verbalized understanding               PT Long  Term Goals - 06/17/20 1008      PT LONG TERM GOAL #1   Title Pt will be able to tranfer in and out of car without difficulty.    Baseline pt has difficulty getting his leg in the car    Time 4   starting 11/22   Period Weeks    Status New    Target Date 08/05/20      PT LONG TERM GOAL #2   Title Pt will report no more spasms in his neck which cause increased pain to allow improved comfort.    Time 4   starting week of 11/22   Period Weeks    Status New    Target Date 08/05/20      PT LONG TERM GOAL #3   Title Pt will be independent in a home exercise program for continued strengthening and stretching    Time 4   starting week of 11/22   Period Weeks    Status New    Target Date 08/05/20      PT LONG TERM GOAL #4   Title Pt will report a 75% improvement in R wrist discomfort as evidenced by negative test for de quervains.    Time 4   starting week of 11/22   Period Weeks    Status New    Target Date 08/05/20                 Plan - 07/19/20 1358    Clinical Impression Statement Added exercises today to pt's HEP since his is still having pain in the front and back of his leg. Educated pt in hamstring stretch and quad stretch as well as sit to stands without UE support. Added those to pt's HEP to perform daily. Then continued manual therapy to bilateral neck since pt feels his pain and tightness in his neck has worsened. He has been having more pain on the left side and there was a small "bean shaped" area that was palpable and tender- educated pt to follow up with his dr regarding this.    PT Frequency 2x / week    PT Duration 4 weeks    PT Treatment/Interventions ADLs/Self Care Home Management;Therapeutic activities;Therapeutic exercise;Patient/family education;Manual techniques;Passive range of motion;Scar mobilization;Taping;Neuromuscular re-education    PT Next Visit Plan cont massage to neck in area of tightness and instruct wife and pt, continue LE ex  PT Home  Exercise Plan purchase brace and use daily, ice wrist daily, do neck stretch, Access Code: [email or web address]    Consulted and Agree with Plan of Care Patient    Family Member Consulted wife           Patient will benefit from skilled therapeutic intervention in order to improve the following deficits and impairments:  Decreased range of motion,Decreased scar mobility,Decreased strength,Increased fascial restricitons,Postural dysfunction,Pain,Decreased mobility  Visit Diagnosis: Pain in left leg  Cervicalgia  Disorder of the skin and subcutaneous tissue related to radiation, unspecified     Problem List Patient Active Problem List   Diagnosis Date Noted  . GERD (gastroesophageal reflux disease) 10/16/2018  . Malignant neoplasm of base of tongue ([place]) 09/27/2018  . Aortic atherosclerosis ([place]) 08/08/2018  . History of testicular cancer 08/08/2018  . Alzheimer's dementia ([place]) 09/11/2016  . Essential tremor 09/11/2016  . Hyperreflexia 06/08/2016  . Family history of heart disease in male family member before age 68 08/31/2015  . Depression 10/19/2014  . OSA (obstructive sleep apnea) 08/24/2014  . Hypertension   . Arthritis   . History of prostate cancer 06/07/2011  . Hyperlipidemia 12/28/2010  . Benign prostatic hyperplasia with urinary retention 12/28/2010  . Hypogonadism in male 12/28/2010    Allyson Sabal [facility] 07/19/2020, 2:03 PM  [place], [place], [address] Phone: [phone]   Fax:  [phone]  Name: [name] MRN: [id number] Date of Birth: 09-01-1945  Manus Gunning, PT 07/19/20 2:03 PM

## 2020-07-19 NOTE — Patient Instructions (Addendum)
Access Code: [email or web address] URL: [email or web address] Date: 07/19/2020 Prepared by: Manus Gunning  Exercises Standing Hip Abduction with Counter Support - 1 x daily - 7 x weekly - 1 sets - 10 reps Supine Bridge - 1 x daily - 7 x weekly - 1 sets - 10 reps - 5 sec hold Mini Squat - 1 x daily - 7 x weekly - 1 sets - 10 reps Seated Hamstring Stretch - 1 x daily - 7 x weekly - 1 sets - 3 reps - 30 sec hold Quad Stretch - 1 x daily - 7 x weekly - 1 sets - 3 reps - 30 sec hold Sit to Stand with Arms Crossed - 1 x daily - 7 x weekly - 1 sets - 10 reps

## 2020-07-21 ENCOUNTER — Encounter: Payer: Self-pay | Admitting: Physical Therapy

## 2020-07-21 ENCOUNTER — Other Ambulatory Visit: Payer: Self-pay

## 2020-07-21 ENCOUNTER — Ambulatory Visit: Payer: Medicare Other | Admitting: Physical Therapy

## 2020-07-21 DIAGNOSIS — R293 Abnormal posture: Secondary | ICD-10-CM

## 2020-07-21 DIAGNOSIS — M79605 Pain in left leg: Secondary | ICD-10-CM

## 2020-07-21 DIAGNOSIS — M542 Cervicalgia: Secondary | ICD-10-CM

## 2020-07-21 NOTE — Therapy (Signed)
[place], [place], [address] Phone: [phone]   Fax:  [phone]  Physical Therapy Treatment  Patient Details  Name: [name] MRN: [id number] Date of Birth: 28-Dec-1945 Referring Provider (PT): Reita May Date: 07/21/2020   PT End of Session - 07/21/20 1000    Visit Number 6    Number of Visits 9    Date for PT Re-Evaluation 08/05/20    PT Start Time 0909    PT Stop Time 0959    PT Time Calculation (min) 50 min    Activity Tolerance Patient tolerated treatment well    Behavior During Therapy [facility] for tasks assessed/performed           Past Medical History:  Diagnosis Date  . Allergy   . Alzheimer disease ([place]) 06/2018   diagnosed with early stage alzheimers  . Arthritis    osteoarthritis  . Cancer [facility])    testicular, age 36, orchiectomy 82  . Cataract    B/L CATARACTS NO SURGERY  . Dementia ([place])   . Depression   . Dyslipidemia   . Heartburn    TAKES ZANTAC  . History of radiation therapy 12/02/18- 01/14/19   Head and neck/ base of tongue. 60 Gy over 30 fractions.   . Hypertension   . Hypogonadism male   . Prostate cancer ([place]) 06/07/11   gleason 3+3=6, vol 59.5 cc  . Sleep apnea   . Status post chemotherapy    testicular cancer 1992, Dr Beryle Beams    Past Surgical History:  Procedure Laterality Date  . APPENDECTOMY    . COLONOSCOPY  2007   gessner  . DIRECT LARYNGOSCOPY N/A 08/19/2018   Procedure: DIRECT LARYNGOSCOPY;  Surgeon: Leta Baptist, MD;  Location: [place];  Service: ENT;  Laterality: N/A;  . INGUINAL HERNIA REPAIR     w/orchiectomy 1992, retroperitoneal lymph node dissection  . IR GASTROSTOMY TUBE REMOVAL  02/28/2019  . MENISECTOMY     R&L knee surgeries  . PROSTATE SURGERY     biopsy x 2  . TONGUE BIOPSY N/A 08/19/2018   Procedure: BIOPSY OF TONGUE BASE MASS;  Surgeon: Leta Baptist, MD;  Location: [place];  Service:  ENT;  Laterality: N/A;    There were no vitals filed for this visit.   Subjective Assessment - 07/21/20 0911    Subjective I am doing pretty good I think. The pain in the thigh is doing a little bit better.    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 No/denies    Pain Score 0-No pain                             [facility] Adult PT Treatment/Exercise - 07/21/20 0001      Knee/Hip Exercises: Stretches   Active Hamstring Stretch Both;2 reps;30 seconds   in sitting- pt returned therapist Occupational hygienist Both;30 seconds;2 reps   with support at counter     Knee/Hip Exercises: Standing   Hip ADduction AROM;Both;1 set;10 reps   pt returned therapist demo, counter for support   Other Standing  Knee Exercises wall taps x 10 - pt demonstrated correct form today      Knee/Hip Exercises: Seated   Sit to Sand without UE support;10 reps      Knee/Hip Exercises: Supine   Bridges Strengthening;Both;1 set;10 reps   pt demonstrated good control of core     Manual Therapy   Soft tissue mobilization in supine to bilateral neck with focus on R side to area just under mandible where pt has tight scar band                       PT Long Term Goals - 06/17/20 1008      PT LONG TERM GOAL #1   Title Pt will be able to tranfer in and out of car without difficulty.    Baseline pt has difficulty getting his leg in the car    Time 4   starting 11/22   Period Weeks    Status New    Target Date 08/05/20      PT LONG TERM GOAL #2   Title Pt will report no more spasms in his neck which cause increased pain to allow improved comfort.    Time 4   starting week of 11/22    Period Weeks    Status New    Target Date 08/05/20      PT LONG TERM GOAL #3   Title Pt will be independent in a home exercise program for continued strengthening and stretching    Time 4   starting week of 11/22   Period Weeks    Status New    Target Date 08/05/20      PT LONG TERM GOAL #4   Title Pt will report a 75% improvement in R wrist discomfort as evidenced by negative test for de quervains.    Time 4   starting week of 11/22   Period Weeks    Status New    Target Date 08/05/20                 Plan - 07/21/20 1001    Clinical Impression Statement Continued with having pt return demonstrate HEP today. Pt required less verbal cues today for correct form for exercises. Pt reports he is having less pain in his left leg since the stretches were added to his HEP. Pt wore better more supportive tennis shoes today. Continued manual therapy to neck to decrease pain and tightness.    PT Frequency 2x / week    PT Duration 4 weeks    PT Treatment/Interventions ADLs/Self Care Home Management;Therapeutic activities;Therapeutic exercise;Patient/family education;Manual techniques;Passive range of motion;Scar mobilization;Taping;Neuromuscular re-education    PT Next Visit Plan cont massage to neck in area of tightness and instruct wife and pt, continue LE ex    PT Home Exercise Plan purchase brace and use daily, ice wrist daily, do neck stretch, Access Code: [email or web address]    Consulted and Agree with Plan of Care Patient    Family Member Consulted wife           Patient will benefit from skilled therapeutic intervention in order to improve the following deficits and impairments:  Decreased range of motion,Decreased scar mobility,Decreased strength,Increased fascial restricitons,Postural dysfunction,Pain,Decreased mobility  Visit Diagnosis: Pain in left leg  Cervicalgia  Abnormal posture     Problem List Patient Active Problem List   Diagnosis Date Noted  . GERD  (gastroesophageal reflux disease) 10/16/2018  . Malignant neoplasm of base of tongue (  [place]) 09/27/2018  . Aortic atherosclerosis ([place]) 08/08/2018  . History of testicular cancer 08/08/2018  . Alzheimer's dementia ([place]) 09/11/2016  . Essential tremor 09/11/2016  . Hyperreflexia 06/08/2016  . Family history of heart disease in male family member before age 49 08/31/2015  . Depression 10/19/2014  . OSA (obstructive sleep apnea) 08/24/2014  . Hypertension   . Arthritis   . History of prostate cancer 06/07/2011  . Hyperlipidemia 12/28/2010  . Benign prostatic hyperplasia with urinary retention 12/28/2010  . Hypogonadism in male 12/28/2010    Allyson Sabal [facility] 07/21/2020, 10:05 AM  [place], [place], [address] Phone: [phone]   Fax:  [phone]  Name: MITHCELL SCHUMPERT MRN: [id number] Date of Birth: [birth year]  Manus Gunning, PT 07/21/20 10:05 AM

## 2020-07-22 ENCOUNTER — Other Ambulatory Visit: Payer: Self-pay

## 2020-07-22 ENCOUNTER — Inpatient Hospital Stay: Payer: Medicare Other | Attending: Medical | Admitting: Medical

## 2020-07-22 VITALS — BP 131/75 | HR 77 | Temp 98.4°F | Resp 15 | Ht 68.0 in | Wt 157.8 lb

## 2020-07-22 DIAGNOSIS — R221 Localized swelling, mass and lump, neck: Secondary | ICD-10-CM | POA: Diagnosis not present

## 2020-07-22 DIAGNOSIS — Z87891 Personal history of nicotine dependence: Secondary | ICD-10-CM | POA: Insufficient documentation

## 2020-07-22 DIAGNOSIS — Z801 Family history of malignant neoplasm of trachea, bronchus and lung: Secondary | ICD-10-CM | POA: Diagnosis not present

## 2020-07-22 DIAGNOSIS — C01 Malignant neoplasm of base of tongue: Secondary | ICD-10-CM | POA: Diagnosis not present

## 2020-07-22 NOTE — Patient Instructions (Signed)

## 2020-07-23 NOTE — Progress Notes (Signed)
[facility] Progress Note   [name] [id number] 1946-04-16 74 y.o.  [name] is managed by Dr. Eppie Gibson  Actively treated with chemotherapy/immunotherapy/hormonal therapy: no  Next scheduled appointment with provider: 01/28/2021  Assessment: Plan:    Malignant neoplasm of base of tongue ([place]) - Plan: CT Soft Tissue Neck W Contrast   History of malignant neoplasm of the base of the tongue: Patient is seen today after being referred by physical therapy who was concerned that the patient could have a lump in the left side of his neck.  He has been referred for a CT scan of the neck.  He will return on 08/13/2020 for follow-up.  A request will be made for the patient's most recent CT scans from [facility] so that his upcoming CT scans can be compared.  Please see After Visit Summary for patient specific instructions.  Future Appointments  Date Time Provider [place]  07/26/2020  1:00 PM Picacho, [place] L, PT [facility] None  07/28/2020  1:00 PM Wynelle Beckmann, [place] L, [place] [facility] None  08/11/2020  1:30 PM Norma Fredrickson, MD [facility] None  08/13/2020  2:00 PM Harle Stanford., PA-C [facility] None  09/21/2020  2:30 PM Tat, Eustace Quail, DO [facility] None  01/28/2021  2:00 PM Eppie Gibson, MD [facility] None    Orders Placed This Encounter  Procedures  . CT Soft Tissue Neck W Contrast       Subjective:   Patient ID:  [name] is a 74 y.o. (DOB 06-12-1946) male.  Chief Complaint:  Chief Complaint  Patient presents with  . Cyst    L neck     HPI [name]  is a 74 y.o. male with a diagnosis of a malignant neoplasm of the base of the tongue.  He is seen today after being referred by physical therapy who was concerned that the patient could have a lump in the left side of his neck.  His most recent CT scans were completed on 01/07/2020 at [facility].  He reports that he is swallowing  without much difficulty.  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 His weight is stable. He denies any other issues of concern today.  Medications: I have reviewed the patient's current medications.  Allergies: No Known Allergies  Past Medical History:  Diagnosis Date  . Allergy   . Alzheimer disease ([place]) 06/2018   diagnosed with early stage alzheimers  . Arthritis    osteoarthritis  . Cancer [facility])    testicular, age 52, orchiectomy 38  . Cataract    B/L CATARACTS NO SURGERY  . Dementia ([place])   . Depression   . Dyslipidemia   . Heartburn    TAKES ZANTAC  . History of radiation therapy 12/02/18- 01/14/19   Head and neck/ base of tongue. 60 Gy over 30 fractions.   . Hypertension   . Hypogonadism male   . Prostate cancer ([place]) 06/07/11   gleason 3+3=6, vol 59.5 cc  . Sleep apnea   . Status post chemotherapy    testicular cancer 1992, Dr Beryle Beams    Past Surgical History:  Procedure Laterality Date  . APPENDECTOMY    . COLONOSCOPY  2007   gessner  . DIRECT LARYNGOSCOPY N/A 08/19/2018   Procedure: DIRECT LARYNGOSCOPY;  Surgeon: Leta Baptist, MD;  Location: [place];  Service: ENT;  Laterality: N/A;  . INGUINAL HERNIA  REPAIR     w/orchiectomy 1992, retroperitoneal lymph node dissection  . IR GASTROSTOMY TUBE REMOVAL  02/28/2019  . MENISECTOMY     R&L knee surgeries  . PROSTATE SURGERY     biopsy x 2  . TONGUE BIOPSY N/A 08/19/2018   Procedure: BIOPSY OF TONGUE BASE MASS;  Surgeon: Leta Baptist, MD;  Location: [place];  Service: ENT;  Laterality: N/A;    Family History  Problem Relation Age of Onset  . Lung cancer Mother   . Hypertension Mother   . Lung cancer Father   . Heart failure Father   . Hypertension Father     Social History   Socioeconomic History  . Marital status: Married    Spouse name: Not on file  . Number of children: 0  . Years of education: Not on file  . Highest education level: Not on file   Occupational History  . Occupation: RETIRED    Employer: [employer]  Tobacco Use  . Smoking status: Former Smoker    Packs/day: 1.00    Years: 20.00    Pack years: 20.00    Types: Cigarettes    Quit date: 08/03/1999    Years since quitting: 20.9  . Smokeless tobacco: Never Used  Vaping Use  . Vaping Use: Never used  Substance and Sexual Activity  . Alcohol use: Not Currently  . Drug use: No  . Sexual activity: Yes  Other Topics Concern  . Not on file  Social History Narrative  . Not on file   Social Determinants of Health   Financial Resource Strain: Not on file  Food Insecurity: Not on file  Transportation Needs: Not on file  Physical Activity: Not on file  Stress: Not on file  Social Connections: Not on file  Intimate Partner Violence: Not on file    Past Medical History, Surgical history, Social history, and Family history were reviewed and updated as appropriate.   Please see review of systems for further details on the patient's review from today.   Review of Systems:  Review of Systems  Constitutional: Negative for appetite change and unexpected weight change.  HENT: Negative for sore throat and trouble swallowing.        Questionable swelling of the left neck.    Objective:   Physical Exam:  BP 131/75 (BP Location: Left Arm, Patient Position: Sitting)   Pulse 77   Temp 98.4 F (36.9 C) (Tympanic)   Resp 15   Ht 5\' 8"  (1.727 m)   Wt 157 lb 12.8 oz (71.6 kg)   SpO2 97%   BMI 23.99 kg/m  ECOG: 0  Physical Exam Constitutional:      General: He is not in acute distress.    Appearance: He is not ill-appearing, toxic-appearing or diaphoretic.  HENT:     Head: Normocephalic and atraumatic.  Neck:   Skin:    General: Skin is warm and dry.  Neurological:     Mental Status: He is alert.     Coordination: Coordination normal.     Gait: Gait normal.     Lab Review:     Component Value Date/Time   NA 140 08/21/2019 1450   K 4.5  08/21/2019 1450   CL 102 08/21/2019 1450   CO2 27 08/21/2019 1450   GLUCOSE 84 08/21/2019 1450   GLUCOSE 82 09/19/2018 0757   BUN 18 08/21/2019 1450   CREATININE 0.77 08/21/2019 1450   CREATININE 0.81 04/10/2019 1442  CREATININE 0.84 06/08/2016 1425   CALCIUM 9.5 08/21/2019 1450   PROT 6.4 08/21/2019 1450   ALBUMIN 4.3 08/21/2019 1450   AST 34 08/21/2019 1450   AST 19 09/19/2018 0757   ALT 29 08/21/2019 1450   ALT 18 09/19/2018 0757   ALKPHOS 86 08/21/2019 1450   BILITOT 0.4 08/21/2019 1450   BILITOT 0.5 09/19/2018 0757   GFRNONAA 91 08/21/2019 1450   GFRNONAA >60 04/10/2019 1442   GFRAA 106 08/21/2019 1450   GFRAA >60 04/10/2019 1442       Component Value Date/Time   WBC 3.5 08/21/2019 1450   WBC 3.5 (L) 09/19/2018 0757   WBC 3.9 (L) 09/04/2018 1132   RBC 4.39 08/21/2019 1450   RBC 4.15 (L) 09/19/2018 0757   HGB 13.4 08/21/2019 1450   HCT 39.9 08/21/2019 1450   PLT 187 08/21/2019 1450   MCV 91 08/21/2019 1450   MCH 30.5 08/21/2019 1450   MCH 31.3 09/19/2018 0757   MCHC 33.6 08/21/2019 1450   MCHC 33.5 09/19/2018 0757   RDW 12.8 08/21/2019 1450   LYMPHSABS 0.5 (L) 08/21/2019 1450   MONOABS 0.4 09/19/2018 0757   EOSABS 0.2 08/21/2019 1450   BASOSABS 0.0 08/21/2019 1450   -------------------------------  Imaging from last 24 hours (if applicable):  Radiology interpretation: No results found.

## 2020-07-26 ENCOUNTER — Other Ambulatory Visit: Payer: Self-pay

## 2020-07-26 ENCOUNTER — Ambulatory Visit
Admission: RE | Admit: 2020-07-26 | Discharge: 2020-07-26 | Disposition: A | Discharge status: Home / Self Care | Payer: Self-pay | Source: Ambulatory Visit | Attending: Medical | Admitting: Medical

## 2020-07-26 ENCOUNTER — Ambulatory Visit: Payer: Medicare Other | Admitting: Physical Therapy

## 2020-07-26 ENCOUNTER — Encounter: Payer: Self-pay | Admitting: Physical Therapy

## 2020-07-26 DIAGNOSIS — L599 Disorder of the skin and subcutaneous tissue related to radiation, unspecified: Secondary | ICD-10-CM

## 2020-07-26 DIAGNOSIS — C01 Malignant neoplasm of base of tongue: Secondary | ICD-10-CM

## 2020-07-26 DIAGNOSIS — R293 Abnormal posture: Secondary | ICD-10-CM

## 2020-07-26 DIAGNOSIS — M542 Cervicalgia: Secondary | ICD-10-CM

## 2020-07-26 DIAGNOSIS — M79605 Pain in left leg: Secondary | ICD-10-CM

## 2020-07-26 NOTE — Therapy (Signed)
[place], [place], [address] Phone: [phone]   Fax:  [phone]  Physical Therapy Treatment  Patient Details  Name: [name] MRN: [id number] Date of Birth: July 19, 1946 Referring Provider (PT): Reita May Date: 07/26/2020   PT End of Session - 07/26/20 1324    Visit Number 7    Number of Visits 9    Date for PT Re-Evaluation 08/05/20    PT Start Time 1304    PT Stop Time 1354    PT Time Calculation (min) 50 min    Activity Tolerance Patient tolerated treatment well    Behavior During Therapy [facility] for tasks assessed/performed           Past Medical History:  Diagnosis Date  . Allergy   . Alzheimer disease ([place]) 06/2018   diagnosed with early stage alzheimers  . Arthritis    osteoarthritis  . Cancer [facility])    testicular, age 19, orchiectomy 55  . Cataract    B/L CATARACTS NO SURGERY  . Dementia ([place])   . Depression   . Dyslipidemia   . Heartburn    TAKES ZANTAC  . History of radiation therapy 12/02/18- 01/14/19   Head and neck/ base of tongue. 60 Gy over 30 fractions.   . Hypertension   . Hypogonadism male   . Prostate cancer ([place]) 06/07/11   gleason 3+3=6, vol 59.5 cc  . Sleep apnea   . Status post chemotherapy    testicular cancer 1992, Dr Beryle Beams    Past Surgical History:  Procedure Laterality Date  . APPENDECTOMY    . COLONOSCOPY  2007   gessner  . DIRECT LARYNGOSCOPY N/A 08/19/2018   Procedure: DIRECT LARYNGOSCOPY;  Surgeon: Leta Baptist, MD;  Location: [place];  Service: ENT;  Laterality: N/A;  . INGUINAL HERNIA REPAIR     w/orchiectomy 1992, retroperitoneal lymph node dissection  . IR GASTROSTOMY TUBE REMOVAL  02/28/2019  . MENISECTOMY     R&L knee surgeries  . PROSTATE SURGERY     biopsy x 2  . TONGUE BIOPSY N/A 08/19/2018   Procedure: BIOPSY OF TONGUE BASE MASS;  Surgeon: Leta Baptist, MD;  Location: [place];  Service:  ENT;  Laterality: N/A;    There were no vitals filed for this visit.   Subjective Assessment - 07/26/20 1305    Subjective Pt is having pain in knees per his wife. I have a CT scan of my neck on Thursday the 30th to see what that lump is.    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 No/denies    Pain Score 0-No pain                             [facility] Adult PT Treatment/Exercise - 07/26/20 0001      Knee/Hip Exercises: Stretches   Active Hamstring Stretch Both;2 reps;30 seconds   in sitting- pt returned therapist Occupational hygienist Both;30 seconds;2 reps   with support at [employer] Limitations v/c to keep hip in neutral position    Piriformis Stretch Right;Left;2  reps;30 seconds   pt more tight on L side, performed in sitting, pt required mod-max v/c cues     Knee/Hip Exercises: Aerobic   Stationary Bike Level 1 x 10 min with no increase in knee pain      Knee/Hip Exercises: Standing   Hip ADduction AROM;Both;1 set;10 reps   pt returned therapist demo, counter for support   Hip ADduction Limitations v/c to keep small range of motion to avoid ER    Other Standing Knee Exercises wall taps x 10 pt required v/c for correct form      Knee/Hip Exercises: Supine   Bridges Strengthening;Both;1 set;10 reps   pt demonstrated good control of core     Manual Therapy   Soft tissue mobilization in supine to bilateral neck with focus on R side to area just under mandible where pt has tight scar band                       PT Long Term Goals - 06/17/20 1008      PT LONG TERM GOAL #1   Title Pt will be able to tranfer in and out of car without  difficulty.    Baseline pt has difficulty getting his leg in the car    Time 4   starting 11/22   Period Weeks    Status New    Target Date 08/05/20      PT LONG TERM GOAL #2   Title Pt will report no more spasms in his neck which cause increased pain to allow improved comfort.    Time 4   starting week of 11/22   Period Weeks    Status New    Target Date 08/05/20      PT LONG TERM GOAL #3   Title Pt will be independent in a home exercise program for continued strengthening and stretching    Time 4   starting week of 11/22   Period Weeks    Status New    Target Date 08/05/20      PT LONG TERM GOAL #4   Title Pt will report a 75% improvement in R wrist discomfort as evidenced by negative test for de quervains.    Time 4   starting week of 11/22   Period Weeks    Status New    Target Date 08/05/20                 Plan - 07/26/20 1356    Clinical Impression Statement Pt's wife reports that pt has not been compliant with his exercises over the last week. She reports his attitude has been different over the last few days and this is normal for him and comes and goes with his Alzheimers. Pt has been having more pain per his wife. She reports he has complained more of pain since he stopped doing his exercises. Spent session today going through pt's exercises - pt requried more v/c today to show good form. Pt saw the PA who is ordering a CT of his neck where pt palpated lump last visit. Began a piriformis stretch today bc pt had in that area and pt had increased tightness on L side. Will add to HEP at next session if pt has no pain after stretch from this session.    PT Frequency 2x / week    PT Duration 4 weeks    PT Treatment/Interventions ADLs/Self Care Home Management;Therapeutic activities;Therapeutic exercise;Patient/family education;Manual techniques;Passive range of  motion;Scar mobilization;Taping;Neuromuscular re-education    PT Next Visit Plan cont massage to neck in area  of tightness and instruct wife and pt, continue LE ex    PT Home Exercise Plan purchase brace and use daily, ice wrist daily, do neck stretch, Access Code: [email or web address]    Consulted and Agree with Plan of Care Patient    Family Member Consulted wife           Patient will benefit from skilled therapeutic intervention in order to improve the following deficits and impairments:  Decreased range of motion,Decreased scar mobility,Decreased strength,Increased fascial restricitons,Postural dysfunction,Pain,Decreased mobility  Visit Diagnosis: Pain in left leg  Disorder of the skin and subcutaneous tissue related to radiation, unspecified  Cervicalgia  Abnormal posture     Problem List Patient Active Problem List   Diagnosis Date Noted  . GERD (gastroesophageal reflux disease) 10/16/2018  . Malignant neoplasm of base of tongue ([place]) 09/27/2018  . Aortic atherosclerosis ([place]) 08/08/2018  . History of testicular cancer 08/08/2018  . Alzheimer's dementia ([place]) 09/11/2016  . Essential tremor 09/11/2016  . Hyperreflexia 06/08/2016  . Family history of heart disease in male family member before age 33 08/31/2015  . Depression 10/19/2014  . OSA (obstructive sleep apnea) 08/24/2014  . Hypertension   . Arthritis   . History of prostate cancer 06/07/2011  . Hyperlipidemia 12/28/2010  . Benign prostatic hyperplasia with urinary retention 12/28/2010  . Hypogonadism in male 12/28/2010    Allyson Sabal [facility] 07/26/2020, 2:00 PM  [place], [place], [address] Phone: [phone]   Fax:  [phone]  Name: SOHRAB KEELAN MRN: [id number] Date of Birth: 01/13/1946  Manus Gunning, PT 07/26/20 2:00 PM

## 2020-07-26 NOTE — Progress Notes (Signed)
Oncology Nurse Navigator Documentation   +++Fax sent to [facility] Radiology Imaging Library requesting the following imaging be pushed to [employer] per request from [employer] PA.   .  01/07/20 CT Neck/Thyroid W. Contrast  Notification of successful fax transmission received.  Order placed for assignment to [place].  Harlow Asa RN, BSN, OCN Head & Neck Oncology Nurse Progress at [facility] Phone # [phone]  Fax # [phone]

## 2020-07-28 ENCOUNTER — Ambulatory Visit: Payer: Medicare Other | Admitting: Physical Therapy

## 2020-08-05 ENCOUNTER — Ambulatory Visit (HOSPITAL_COMMUNITY)
Admission: RE | Admit: 2020-08-05 | Discharge: 2020-08-05 | Disposition: A | Discharge status: Home / Self Care | Payer: Medicare Other | Source: Ambulatory Visit | Attending: Medical | Admitting: Medical

## 2020-08-05 ENCOUNTER — Other Ambulatory Visit: Payer: Self-pay

## 2020-08-05 DIAGNOSIS — C01 Malignant neoplasm of base of tongue: Secondary | ICD-10-CM | POA: Insufficient documentation

## 2020-08-05 LAB — POCT I-STAT CREATININE: Creatinine, Ser: 0.8 mg/dL (ref 0.61–1.24)

## 2020-08-05 MED ORDER — IOHEXOL 300 MG/ML  SOLN
75.0000 mL | Freq: Once | INTRAMUSCULAR | Status: AC | PRN
  Administered 2020-08-05: 16:00:00 75 mL via INTRAVENOUS

## 2020-08-08 ENCOUNTER — Encounter: Payer: Self-pay | Admitting: Medical

## 2020-08-11 ENCOUNTER — Telehealth (INDEPENDENT_AMBULATORY_CARE_PROVIDER_SITE_OTHER): Payer: Medicare Other | Admitting: Psychiatry

## 2020-08-11 ENCOUNTER — Other Ambulatory Visit: Payer: Self-pay

## 2020-08-11 DIAGNOSIS — F325 Major depressive disorder, single episode, in full remission: Secondary | ICD-10-CM

## 2020-08-11 MED ORDER — MIRTAZAPINE 30 MG PO TBDP
30.0000 mg | ORAL_TABLET | Freq: Every day | ORAL | 8 refills | Status: DC
Start: 2020-08-11 — End: 2021-01-19

## 2020-08-11 MED ORDER — VORTIOXETINE HBR 20 MG PO TABS
20.0000 mg | ORAL_TABLET | Freq: Every day | ORAL | 1 refills | Status: DC
Start: 2020-08-11 — End: 2021-01-19

## 2020-08-11 NOTE — Progress Notes (Signed)
.  Psychiatric Initial Adult Assessment   Patient Identification: [name] MRN:  [id number] Date of Evaluation:  08/11/2020 Referral Source: Dr. Lurena Joiner Tat Chief Complaint:   Visit Diagnosis: Major depression mild  Today the patient is doing well. His spirits are good. We spoke to him and his wife Jamesetta So. He denies persistent daily depression. His mood seems up. He is sleeping and eating well. He is maintaining his weight. His energy is reasonably good. He stays busy watching TV and doing other things. 2 years ago he moved into a new setting and he likes it. He says is quiet and peaceful which he feels safe. Patient drinks no alcohol uses no drugs. He has no evidence of psychosis. He is positive and optimistic. However according to [place] his memory continues to fail. Patient does all his basic ADLs. Presently he sees Dr.Tat his neurologist. She is treating with Aricept at an appropriate dose. His wife is wondering if anything else can be done as she sees progression. I do not think the progression is related to his mood state. He takes Trintellix and Remeron appropriately and is clearly had a benefit over the years. He really shows no vegetative symptoms. He has good sense of worth evaluating. He certainly is not suicidal. Him and his wife have no children. But they do have a cat who they care for a lot. Patient is generally very content and has no complaints.  Depression Symptoms:  fatigue, (Hypo) Manic Symptoms:   Anxiety Symptoms:   Psychotic Symptoms:   PTSD Symptoms:   Past Psychiatric History: Trintellix, Wellbutrin  Previous Psychotropic Medications:   Substance Abuse History in the last 12 months:    Consequences of Substance Abuse:   Past Medical History:  Past Medical History:  Diagnosis Date  . Allergy   . Alzheimer disease ([facility]) 06/2018   diagnosed with early stage alzheimers  . Arthritis    osteoarthritis  . Cancer [facility])    testicular, age 64, orchiectomy 27   . Cataract    B/L CATARACTS NO SURGERY  . Dementia ([facility])   . Depression   . Dyslipidemia   . Heartburn    TAKES ZANTAC  . History of radiation therapy 12/02/18- 01/14/19   Head and neck/ base of tongue. 60 Gy over 30 fractions.   . Hypertension   . Hypogonadism male   . Prostate cancer ([facility]) 06/07/11   gleason 3+3=6, vol 59.5 cc  . Sleep apnea   . Status post chemotherapy    testicular cancer 1992, Dr Cyndie Chime    Past Surgical History:  Procedure Laterality Date  . APPENDECTOMY    . COLONOSCOPY  2007   gessner  . DIRECT LARYNGOSCOPY N/A 08/19/2018   Procedure: DIRECT LARYNGOSCOPY;  Surgeon: Newman Pies, MD;  Location: [facility];  Service: ENT;  Laterality: N/A;  . INGUINAL HERNIA REPAIR     w/orchiectomy 1992, retroperitoneal lymph node dissection  . IR GASTROSTOMY TUBE REMOVAL  02/28/2019  . MENISECTOMY     R&L knee surgeries  . PROSTATE SURGERY     biopsy x 2  . TONGUE BIOPSY N/A 08/19/2018   Procedure: BIOPSY OF TONGUE BASE MASS;  Surgeon: Newman Pies, MD;  Location: [facility];  Service: ENT;  Laterality: N/A;    Family Psychiatric History:   Family History:  Family History  Problem Relation Age of Onset  . Lung cancer Mother   . Hypertension Mother   . Lung cancer Father   .  Heart failure Father   . Hypertension Father     Social History:   Social History   Socioeconomic History  . Marital status: Married    Spouse name: Not on file  . Number of children: 0  . Years of education: Not on file  . Highest education level: Not on file  Occupational History  . Occupation: RETIRED    Employer: [employer]  Tobacco Use  . Smoking status: Former Smoker    Packs/day: 1.00    Years: 20.00    Pack years: 20.00    Types: Cigarettes    Quit date: 08/03/1999    Years since quitting: 21.0  . Smokeless tobacco: Never Used  Vaping Use  . Vaping Use: Never used  Substance and Sexual Activity  . Alcohol use: Not Currently   . Drug use: No  . Sexual activity: Yes  Other Topics Concern  . Not on file  Social History Narrative  . Not on file   Social Determinants of Health   Financial Resource Strain: Not on file  Food Insecurity: Not on file  Transportation Needs: Not on file  Physical Activity: Not on file  Stress: Not on file  Social Connections: Not on file    Additional Social History:   Allergies:  No Known Allergies  Metabolic Disorder Labs: No results found for: HGBA1C, MPG No results found for: PROLACTIN Lab Results  Component Value Date   CHOL 120 08/21/2019   TRIG 143 08/21/2019   HDL 47 08/21/2019   CHOLHDL 2.6 08/21/2019   VLDL 18 09/11/2016   LDLCALC 48 08/21/2019   LDLCALC 70 09/17/2017   Lab Results  Component Value Date   TSH 3.960 05/19/2020    Therapeutic Level Labs: No results found for: LITHIUM No results found for: CBMZ No results found for: VALPROATE  Current Medications: Current Outpatient Medications  Medication Sig Dispense Refill  . carboxymethylcellulose (REFRESH PLUS) 0.5 % SOLN 1 drop 3 (three) times daily as needed. One drop each eye 2-3 times daily    . cholecalciferol (VITAMIN D3) 25 MCG (1000 UNIT) tablet Take 1,000 Units by mouth daily.    Marland Kitchen donepezil (ARICEPT) 10 MG tablet TAKE 1 TABLET(10 MG) BY MOUTH AT BEDTIME 30 tablet 1  . famotidine (PEPCID) 10 MG tablet Take 10 mg by mouth daily.    . finasteride (PROSCAR) 5 MG tablet TK 1 T PO QD    . ipratropium (ATROVENT) 0.06 % nasal spray USE 2 SPRAYS IEN BID PRN FOR DRAINAGE    . levothyroxine (SYNTHROID) 50 MCG tablet Take 1 tablet (50 mcg total) by mouth daily before breakfast. Take with water on an empty stomach at least one hour before other meds, vitamins, or food. 90 tablet 3  . mirtazapine (REMERON SOLTAB) 30 MG disintegrating tablet Take 1 tablet (30 mg total) by mouth at bedtime. 30 tablet 8  . polyethylene glycol powder (GLYCOLAX/MIRALAX) powder Take 17 g by mouth daily. 3350 g 11  .  primidone (MYSOLINE) 50 MG tablet TAKE 1 TABLET(50 MG) BY MOUTH TWICE DAILY 180 tablet 0  . rosuvastatin (CRESTOR) 20 MG tablet Take 1 tablet (20 mg total) by mouth daily. 90 tablet 3  . sodium fluoride (PREVIDENT 5000 PLUS) 1.1 % CREA dental cream Apply cream to tooth brush. Brush teeth for 2 minutes. Spit out excess. DO NOT rinse afterwards. Repeat nightly. 1 Tube prn  . tamsulosin (FLOMAX) 0.4 MG CAPS capsule Take 0.4 mg by mouth at bedtime.    Marland Kitchen  terazosin (HYTRIN) 2 MG capsule TAKE 1 CAPSULE(2 MG) BY MOUTH DAILY 90 capsule 1  . vortioxetine HBr (TRINTELLIX) 20 MG TABS tablet Take 1 tablet (20 mg total) by mouth daily. 90 tablet 1   No current facility-administered medications for this visit.    Musculoskeletal: Strength & Muscle Tone: within normal limits Gait & Station: normal Patient leans: N/A  Psychiatric Specialty Exam: ROS  There were no vitals taken for this visit.There is no height or weight on file to calculate BMI.  General Appearance: Casual  Eye Contact:  Good  Speech:  Normal Rate  Volume:  Normal  Mood:  Negative  Affect:  Congruent  Thought Process:  Goal Directed  Orientation:  Full (Time, Place, and Person)  Thought Content:  Logical  Suicidal Thoughts:  No  Homicidal Thoughts:  No  Memory:  NA  Judgement:  Good  Insight:  Fair  Psychomotor Activity:  Normal  Concentration:    Recall:  [place] of Knowledge:Good  Language: Good  Akathisia:  No  Handed:  Right  AIMS (if indicated):  not done  Assets:  Desire for Improvement Financial Resources/Insurance  ADL's:  Intact  Cognition: Impaired,  Moderate  Sleep:     Screenings: Mini-Mental   Flowsheet Row Office Visit from 06/08/2016 in [place]  Total Score (max 30 points ) 28    PHQ2-9   [place] Visit from 08/21/2019 in [place] Visit from 09/18/2018 in [place] Visit from 09/17/2017 in [place] Visit  from 09/11/2016 in [place] Visit from 08/31/2015 in [place]  PHQ-2 Total Score 4 6 4 4 6   PHQ-9 Total Score 8 19 10  -- 16      Assessment and Plan:  1/5/20222:04 PM   This patient is #1 problems major depression. At this time he is doing very well. His wife observed some memory changes but the patient interacts with me very normally. Patient takes Trintellix 10 mg and Remeron as prescribed. He'll continue these medicines and return to see me in 5 or 6 months. From a psychiatrist for review he is very stable.

## 2020-08-13 ENCOUNTER — Inpatient Hospital Stay: Payer: Medicare Other | Admitting: Medical

## 2020-09-17 NOTE — Progress Notes (Signed)
Assessment/Plan:    1.  Essential Tremor  -Continue primidone, 50 mg twice per day.  2.  Memory change/dementia  -Continue donepezil, 10 mg daily.  -talked to them about importance of mental an physical exercise and exactly what that meant  -think that marital strain may play role here.  Is following with psychiatry  3.  Major depression  -Following with Dr. Casimiro Needle  4.  I have not changed the patient's medications.  Given that, I did offer to have him f/u with PCP but they want to f/u here in a year.  Subjective:   [name] was seen today in follow up for essential tremor and memory change/dementia.  My previous records were reviewed prior to todays visit.  Last seen in the office about 1 year ago.  Pt thinks that tremor is about the same.  Wife thinks that they are worse as she notes that he has to switch hands with eating.  Pt states that tremor is not bothersome to him.  Occasionally will spill things.  Pt denies falls.  Pt denies lightheadedness, near syncope.  No hallucinations.  Not exercising.   Follows with Dr. Casimiro Needle for mood and last saw him through telemedicine on January 5.  He is on Trintellix and mirtazapine.  Current prescribed movement disorder medications:  Primidone, 50 mg twice per day Donepezil, 10 mg daily    ALLERGIES:  No Known Allergies  CURRENT MEDICATIONS:  Outpatient Encounter Medications as of 09/21/2020  Medication Sig  . carboxymethylcellulose (REFRESH PLUS) 0.5 % SOLN 1 drop 3 (three) times daily as needed. One drop each eye 2-3 times daily  . cholecalciferol (VITAMIN D3) 25 MCG (1000 UNIT) tablet Take 1,000 Units by mouth daily.  Marland Kitchen donepezil (ARICEPT) 10 MG tablet TAKE 1 TABLET(10 MG) BY MOUTH AT BEDTIME  . famotidine (PEPCID) 10 MG tablet Take 10 mg by mouth daily.  . finasteride (PROSCAR) 5 MG tablet TK 1 T PO QD  . ipratropium (ATROVENT) 0.06 % nasal spray USE 2 SPRAYS IEN BID PRN FOR DRAINAGE  . levothyroxine (SYNTHROID) 50 MCG  tablet Take 1 tablet (50 mcg total) by mouth daily before breakfast. Take with water on an empty stomach at least one hour before other meds, vitamins, or food.  . mirtazapine (REMERON SOLTAB) 30 MG disintegrating tablet Take 1 tablet (30 mg total) by mouth at bedtime.  . polyethylene glycol powder (GLYCOLAX/MIRALAX) powder Take 17 g by mouth daily.  . primidone (MYSOLINE) 50 MG tablet TAKE 1 TABLET(50 MG) BY MOUTH TWICE DAILY  . rosuvastatin (CRESTOR) 20 MG tablet Take 1 tablet (20 mg total) by mouth daily.  . sodium fluoride (PREVIDENT 5000 PLUS) 1.1 % CREA dental cream Apply cream to tooth brush. Brush teeth for 2 minutes. Spit out excess. DO NOT rinse afterwards. Repeat nightly.  . terazosin (HYTRIN) 2 MG capsule TAKE 1 CAPSULE(2 MG) BY MOUTH DAILY  . vortioxetine HBr (TRINTELLIX) 20 MG TABS tablet Take 1 tablet (20 mg total) by mouth daily.  . [DISCONTINUED] tamsulosin (FLOMAX) 0.4 MG CAPS capsule Take 0.4 mg by mouth at bedtime. (Patient not taking: Reported on 09/21/2020)   No facility-administered encounter medications on file as of 09/21/2020.     Objective:    PHYSICAL EXAMINATION:    VITALS:   Vitals:   09/21/20 1422  BP: 120/88  Pulse: 71  SpO2: 98%  Weight: 153 lb (69.4 kg)  Height: 5\' 8"  (1.727 m)    GEN:  The patient appears stated  age and is in NAD.  There is tension b/w wife and pt. HEENT:  Normocephalic, atraumatic.  The mucous membranes are moist. The superficial temporal arteries are without ropiness or tenderness. CV:  RRR Lungs:  CTAB Neck/HEME:  There are no carotid bruits bilaterally.  Neurological examination:  Orientation: The patient is alert and oriented x3. Cranial nerves: There is good facial symmetry. The speech is fluent and clear. Soft palate rises symmetrically and there is no tongue deviation. Hearing is intact to conversational tone. Sensation: Sensation is intact to light touch throughout Motor: Strength is at least antigravity  x4.  Movement examination: Tone: There is normal tone in the UE/LE Abnormal movements: mild postural tremor Coordination:  There is no decremation with RAM's Gait and Station: The patient has no difficulty arising out of a deep-seated chair without the use of the hands. The patient's stride length is good I have reviewed and interpreted the following labs independently   Chemistry      Component Value Date/Time   NA 140 08/21/2019 1450   K 4.5 08/21/2019 1450   CL 102 08/21/2019 1450   CO2 27 08/21/2019 1450   BUN 18 08/21/2019 1450   CREATININE 0.80 08/05/2020 1535   CREATININE 0.81 04/10/2019 1442   CREATININE 0.84 06/08/2016 1425      Component Value Date/Time   CALCIUM 9.5 08/21/2019 1450   ALKPHOS 86 08/21/2019 1450   AST 34 08/21/2019 1450   AST 19 09/19/2018 0757   ALT 29 08/21/2019 1450   ALT 18 09/19/2018 0757   BILITOT 0.4 08/21/2019 1450   BILITOT 0.5 09/19/2018 0757      Lab Results  Component Value Date   WBC 3.5 08/21/2019   HGB 13.4 08/21/2019   HCT 39.9 08/21/2019   MCV 91 08/21/2019   PLT 187 08/21/2019   Lab Results  Component Value Date   TSH 3.960 05/19/2020     Chemistry      Component Value Date/Time   NA 140 08/21/2019 1450   K 4.5 08/21/2019 1450   CL 102 08/21/2019 1450   CO2 27 08/21/2019 1450   BUN 18 08/21/2019 1450   CREATININE 0.80 08/05/2020 1535   CREATININE 0.81 04/10/2019 1442   CREATININE 0.84 06/08/2016 1425      Component Value Date/Time   CALCIUM 9.5 08/21/2019 1450   ALKPHOS 86 08/21/2019 1450   AST 34 08/21/2019 1450   AST 19 09/19/2018 0757   ALT 29 08/21/2019 1450   ALT 18 09/19/2018 0757   BILITOT 0.4 08/21/2019 1450   BILITOT 0.5 09/19/2018 0757         Total time spent on today's visit was 30 minutes, including both face-to-face time and nonface-to-face time.  Time included that spent on review of records (prior notes available to me/labs/imaging if pertinent), discussing treatment and goals, answering  patient's questions and coordinating care.  Cc:  Denita Lung, MD

## 2020-09-21 ENCOUNTER — Encounter: Payer: Self-pay | Admitting: Neurology

## 2020-09-21 ENCOUNTER — Ambulatory Visit (INDEPENDENT_AMBULATORY_CARE_PROVIDER_SITE_OTHER): Payer: Medicare Other | Admitting: Neurology

## 2020-09-21 ENCOUNTER — Other Ambulatory Visit: Payer: Self-pay

## 2020-09-21 VITALS — BP 120/88 | HR 71 | Ht 68.0 in | Wt 153.0 lb

## 2020-09-21 DIAGNOSIS — G250 Essential tremor: Secondary | ICD-10-CM | POA: Diagnosis not present

## 2020-09-21 DIAGNOSIS — G301 Alzheimer's disease with late onset: Secondary | ICD-10-CM

## 2020-09-21 DIAGNOSIS — F0280 Dementia in other diseases classified elsewhere without behavioral disturbance: Secondary | ICD-10-CM

## 2020-09-21 MED ORDER — DONEPEZIL HCL 10 MG PO TABS
10.0000 mg | ORAL_TABLET | Freq: Every day | ORAL | 3 refills | Status: DC
Start: 2020-09-21 — End: 2021-09-22

## 2020-09-21 MED ORDER — PRIMIDONE 50 MG PO TABS
50.0000 mg | ORAL_TABLET | Freq: Two times a day (BID) | ORAL | 3 refills | Status: DC
Start: 2020-09-21 — End: 2021-09-13

## 2020-09-21 NOTE — Patient Instructions (Signed)

## 2020-09-26 ENCOUNTER — Other Ambulatory Visit: Payer: Self-pay | Admitting: Family Medicine

## 2020-09-26 DIAGNOSIS — E785 Hyperlipidemia, unspecified: Secondary | ICD-10-CM

## 2020-10-27 ENCOUNTER — Other Ambulatory Visit: Payer: Self-pay | Admitting: Family Medicine

## 2020-10-27 DIAGNOSIS — I10 Essential (primary) hypertension: Secondary | ICD-10-CM

## 2020-11-16 ENCOUNTER — Encounter: Payer: Self-pay | Admitting: Family Medicine

## 2020-11-16 ENCOUNTER — Ambulatory Visit (INDEPENDENT_AMBULATORY_CARE_PROVIDER_SITE_OTHER): Payer: Medicare Other | Admitting: Family Medicine

## 2020-11-16 VITALS — BP 120/82 | HR 79 | Temp 99.1°F | Ht 65.5 in | Wt 149.8 lb

## 2020-11-16 DIAGNOSIS — E785 Hyperlipidemia, unspecified: Secondary | ICD-10-CM | POA: Diagnosis not present

## 2020-11-16 DIAGNOSIS — E039 Hypothyroidism, unspecified: Secondary | ICD-10-CM | POA: Insufficient documentation

## 2020-11-16 DIAGNOSIS — Z23 Encounter for immunization: Secondary | ICD-10-CM

## 2020-11-16 DIAGNOSIS — G250 Essential tremor: Secondary | ICD-10-CM

## 2020-11-16 DIAGNOSIS — E291 Testicular hypofunction: Secondary | ICD-10-CM

## 2020-11-16 DIAGNOSIS — M1990 Unspecified osteoarthritis, unspecified site: Secondary | ICD-10-CM

## 2020-11-16 DIAGNOSIS — Z0000 Encounter for general adult medical examination without abnormal findings: Secondary | ICD-10-CM

## 2020-11-16 DIAGNOSIS — I10 Essential (primary) hypertension: Secondary | ICD-10-CM

## 2020-11-16 DIAGNOSIS — Z8546 Personal history of malignant neoplasm of prostate: Secondary | ICD-10-CM

## 2020-11-16 DIAGNOSIS — C01 Malignant neoplasm of base of tongue: Secondary | ICD-10-CM

## 2020-11-16 DIAGNOSIS — E038 Other specified hypothyroidism: Secondary | ICD-10-CM | POA: Insufficient documentation

## 2020-11-16 DIAGNOSIS — R338 Other retention of urine: Secondary | ICD-10-CM

## 2020-11-16 DIAGNOSIS — N401 Enlarged prostate with lower urinary tract symptoms: Secondary | ICD-10-CM

## 2020-11-16 DIAGNOSIS — F0280 Dementia in other diseases classified elsewhere without behavioral disturbance: Secondary | ICD-10-CM

## 2020-11-16 DIAGNOSIS — G4733 Obstructive sleep apnea (adult) (pediatric): Secondary | ICD-10-CM

## 2020-11-16 DIAGNOSIS — I700 Atherosclerosis of aorta: Secondary | ICD-10-CM

## 2020-11-16 DIAGNOSIS — G309 Alzheimer's disease, unspecified: Secondary | ICD-10-CM

## 2020-11-16 DIAGNOSIS — Z8547 Personal history of malignant neoplasm of testis: Secondary | ICD-10-CM

## 2020-11-16 DIAGNOSIS — K219 Gastro-esophageal reflux disease without esophagitis: Secondary | ICD-10-CM

## 2020-11-16 DIAGNOSIS — F32A Depression, unspecified: Secondary | ICD-10-CM

## 2020-11-16 MED ORDER — FINASTERIDE 5 MG PO TABS: ORAL_TABLET | ORAL | 3 refills | Status: DC

## 2020-11-16 MED ORDER — ROSUVASTATIN CALCIUM 20 MG PO TABS: ORAL_TABLET | ORAL | 3 refills | Status: DC

## 2020-11-16 MED ORDER — TERAZOSIN HCL 2 MG PO CAPS: 2.0000 mg | ORAL_CAPSULE | Freq: Every day | ORAL | 3 refills | Status: DC

## 2020-11-16 NOTE — Progress Notes (Signed)
[name] is a 75 y.o. male who presents for annual wellness visit,CPE and follow-up on chronic medical conditions.  He has a recent history of tongue cancer and is now in every 38-month surveillance.  He has had radiation which also affected his thyroid function.  He is taking thyroid meds at the present time of 50 mcg.  He does have a history of hypogonadism but presently is on no medication.  Does have BPH and continues on finasteride.  He is also using Hytrin to help with bladder function.  Does complain of difficulty with hand arthritis but is presently not taking any medication.  He also has a previous history of prostate cancer as well as a remote history of testicular cancer.  Does have OSA but presently is not involved in any therapy.  He continues on Crestor and does have a family history of heart disease.  He also has atherosclerosis.  Continues to be followed by neurology and psychiatry.  He does have reflux and is using Pepcid on a daily basis.  In general he seems to be fairly stable on his present medication regimen.   Immunizations and Health Maintenance Immunization History  Administered Date(s) Administered  . Influenza Split 04/18/2012, 05/06/2013, 05/27/2015  . Influenza Whole 04/12/2011  . Influenza, High Dose Seasonal PF 05/21/2017, 05/16/2018, 04/11/2019, 05/09/2020  . Influenza-Unspecified 04/23/2014, 05/18/2016, 05/21/2017, 05/16/2018, 05/09/2020  . PFIZER(Purple Top)SARS-COV-2 Vaccination 09/19/2019, 10/03/2019, 04/06/2020  . Pneumococcal Conjugate-13 08/31/2015  . Pneumococcal Polysaccharide-23 12/09/2009  . Tdap 01/17/2000, 07/25/2012  . Zoster 12/08/2008   Health Maintenance Due  Topic Date Due  . COVID-19 Vaccine (4 - Booster) 10/04/2020    Last colonoscopy: 08/17/2006 Last PSA: 06/08/16 Dentist: Q three months  Ophtho: Q year Exercise: at home staying busy  Other doctors caring for patient include: Dr. Casimiro Needle , Dr. Aleen Sells, Dr. Carles Collet neurology  Advanced  Directives: Does Patient Have a Medical Advance Directive?: Yes Type of Advance Directive: Living will,Healthcare Power of [name] in Chart?: No - copy requested  Depression screen:  See questionnaire below.     Depression screen [name] 11/16/2020 11/16/2020 08/21/2019 09/18/2018 09/17/2017  Decreased Interest 0 0 1 3 2   Down, Depressed, Hopeless 0 0 3 3 2   PHQ - 2 Score 0 0 4 6 4   Altered sleeping 0 - 0 3 0  Tired, decreased energy 1 - 0 2 1  Change in appetite 0 - 1 2 2   Feeling bad or failure about yourself  0 - 0 0 1  Trouble concentrating 2 - 3 3 2   Moving slowly or fidgety/restless 0 - 0 3 0  Suicidal thoughts 0 - 0 0 0  PHQ-9 Score 3 - 8 19 10   Difficult doing work/chores Somewhat difficult - Very difficult Very difficult Somewhat difficult  Some recent data might be hidden    Fall Screen: See Questionaire below.   Fall Risk  11/16/2020 09/21/2020 09/22/2019 08/21/2019 12/19/2018  Falls in the past year? 0 0 0 0 0  Number falls in past yr: 0 - 0 - 0  Injury with Fall? 0 0 0 - 0  Risk Factor Category  - - - - -  Risk for fall due to : No Fall Risks - - - -  Follow up Falls evaluation completed - - - Falls evaluation completed    ADL screen:  See questionnaire below.  Functional Status Survey: Is the patient deaf or have difficulty hearing?: No Does  the patient have difficulty seeing, even when wearing glasses/contacts?: No Does the patient have difficulty concentrating, remembering, or making decisions?: Yes Does the patient have difficulty walking or climbing stairs?: Yes (ortho issues) Does the patient have difficulty dressing or bathing?: No Does the patient have difficulty doing errands alone such as visiting a doctor's office or shopping?: Yes (wife handles)   Review of Systems  Constitutional: -, -unexpected weight change, -anorexia, -fatigue Allergy: -sneezing, -itching, -congestion Dermatology: denies changing moles, rash,  lumps ENT: -runny nose, -ear pain, -sore throat,  Cardiology:  -chest pain, -palpitations, -orthopnea, Respiratory: -cough, -shortness of breath, -dyspnea on exertion, -wheezing,  Gastroenterology: -abdominal pain, -nausea, -vomiting, -diarrhea, -constipation, -dysphagia Hematology: -bleeding or bruising problems Musculoskeletal: -arthralgias, -myalgias, -joint swelling, -back pain, - Ophthalmology: -vision changes,  Urology: -dysuria, -difficulty urinating,  -urinary frequency, -urgency, incontinence Neurology: -, -numbness, , -memory loss, -falls, -dizziness    PHYSICAL EXAM:  There were no vitals taken for this visit.  General Appearance: Alert, cooperative, no distress, appears stated age Head: Normocephalic, without obvious abnormality, atraumatic Eyes: PERRL, conjunctiva/corneas clear, EOM's intact,  Ears: Normal TM's and external ear canals Nose: Nares normal, mucosa normal, no drainage or sinus   tenderness Throat: Lips, mucosa, and tongue normal; teeth and gums normal Neck: Supple, no lymphadenopathy, thyroid:no enlargement/tenderness/nodules; no carotid bruit or JVD Lungs: Clear to auscultation bilaterally without wheezes, rales or ronchi; respirations unlabored Heart: Regular rate and rhythm, S1 and S2 normal, no murmur, rub or gallop Abdomen: Soft, non-tender, nondistended, normoactive bowel sounds, no masses, no hepatosplenomegaly  Skin: Skin color, texture, turgor normal, no rashes or lesions Lymph nodes: Cervical, supraclavicular, and axillary nodes normal Neurologic: CNII-XII intact, normal strength, sensation and gait; reflexes 2+ and symmetric throughout   Psych: Normal mood, affect, hygiene and grooming  ASSESSMENT/PLAN: Hyperlipidemia, unspecified hyperlipidemia type - Plan: Lipid panel  History of prostate cancer - Plan: finasteride (PROSCAR) 5 MG tablet  Arthritis  Primary hypertension - Plan: CBC with Differential/Platelet, Comprehensive metabolic  panel  OSA (obstructive sleep apnea)  Depression, unspecified depression type  Alzheimer's dementia without behavioral disturbance, unspecified timing of dementia onset (HCC)  Essential tremor  Aortic atherosclerosis (HCC) - Plan: Lipid panel, rosuvastatin (CRESTOR) 20 MG tablet  History of testicular cancer  Malignant neoplasm of base of tongue (HCC)  Gastroesophageal reflux disease without esophagitis  Hypogonadism in male  Benign prostatic hyperplasia with urinary retention  Immunization, viral disease - Plan: PFIZER Comirnaty(GRAY TOP)COVID-19 Vaccine  Other specified hypothyroidism - Plan: TSH  Hyperlipidemia - Plan: Lipid panel, rosuvastatin (CRESTOR) 20 MG tablet  Essential hypertension - Plan: terazosin (HYTRIN) 2 MG capsule   Did recommend cutting back on Pepcid to every other day dosing to see if that will control his symptoms.  Otherwise he will continue on his present medication regimen follow-up with psychiatry as well as neurology.  Immunization recommendations discussed.  Colonoscopy recommendations reviewed.   Medicare Attestation I have personally reviewed: The patient's medical and social history Their use of alcohol, tobacco or illicit drugs Their current medications and supplements The patient's functional ability including ADLs,fall risks, home safety risks, cognitive, and hearing and visual impairment Diet and physical activities Evidence for depression or mood disorders  The patient's weight, height, and BMI have been recorded in the chart.  I have made referrals, counseling, and provided education to the patient based on review of the above and I have provided the patient with a written personalized care plan for preventive services.     Jill Alexanders, MD  11/16/2020     

## 2020-11-17 LAB — LIPID PANEL
Chol/HDL Ratio: 2.3 ratio (ref 0.0–5.0)
Cholesterol, Total: 118 mg/dL (ref 100–199)
HDL: 51 mg/dL (ref >39)
LDL Chol Calc (NIH): 50 mg/dL (ref 0–99)
Triglycerides: 90 mg/dL (ref 0–149)
VLDL Cholesterol Cal: 17 mg/dL (ref 5–40)

## 2020-11-17 LAB — COMPREHENSIVE METABOLIC PANEL
ALT: 21 IU/L (ref 0–44)
AST: 27 IU/L (ref 0–40)
Albumin/Globulin Ratio: 2.2 (ref 1.2–2.2)
Albumin: 4.7 g/dL (ref 3.7–4.7)
Alkaline Phosphatase: 83 IU/L (ref 44–121)
BUN/Creatinine Ratio: 18 (ref 10–24)
BUN: 15 mg/dL (ref 8–27)
Bilirubin Total: 0.4 mg/dL (ref 0.0–1.2)
CO2: 23 mmol/L (ref 20–29)
Calcium: 9.4 mg/dL (ref 8.6–10.2)
Chloride: 101 mmol/L (ref 96–106)
Creatinine, Ser: 0.83 mg/dL (ref 0.76–1.27)
Globulin, Total: 2.1 g/dL (ref 1.5–4.5)
Glucose: 83 mg/dL (ref 65–99)
Potassium: 4.5 mmol/L (ref 3.5–5.2)
Sodium: 140 mmol/L (ref 134–144)
Total Protein: 6.8 g/dL (ref 6.0–8.5)
eGFR: 93 mL/min/{1.73_m2} (ref >59)

## 2020-11-17 LAB — CBC WITH DIFFERENTIAL/PLATELET
Basophils Absolute: 0 10*3/uL (ref 0.0–0.2)
Basos: 1 %
EOS (ABSOLUTE): 0.1 10*3/uL (ref 0.0–0.4)
Eos: 3 %
Hematocrit: 41.9 % (ref 37.5–51.0)
Hemoglobin: 14 g/dL (ref 13.0–17.7)
Immature Grans (Abs): 0 10*3/uL (ref 0.0–0.1)
Immature Granulocytes: 0 %
Lymphocytes Absolute: 0.5 10*3/uL — ABNORMAL LOW (ref 0.7–3.1)
Lymphs: 14 %
MCH: 30.9 pg (ref 26.6–33.0)
MCHC: 33.4 g/dL (ref 31.5–35.7)
MCV: 93 fL (ref 79–97)
Monocytes Absolute: 0.4 10*3/uL (ref 0.1–0.9)
Monocytes: 11 %
Neutrophils Absolute: 2.3 10*3/uL (ref 1.4–7.0)
Neutrophils: 71 %
Platelets: 188 10*3/uL (ref 150–450)
RBC: 4.53 x10E6/uL (ref 4.14–5.80)
RDW: 12.5 % (ref 11.6–15.4)
WBC: 3.4 10*3/uL (ref 3.4–10.8)

## 2020-11-17 LAB — TSH: TSH: 6.54 u[IU]/mL — ABNORMAL HIGH (ref 0.450–4.500)

## 2020-11-17 MED ORDER — LEVOTHYROXINE SODIUM 75 MCG PO TABS: 75.0000 ug | ORAL_TABLET | Freq: Every day | ORAL | 3 refills | Status: DC

## 2020-11-17 NOTE — Addendum Note (Signed)
Addended by: Denita Lung on: 11/17/2020 01:37 PM   Modules accepted: Orders

## 2021-01-12 ENCOUNTER — Telehealth (HOSPITAL_COMMUNITY): Payer: Medicare Other | Admitting: Psychiatry

## 2021-01-19 ENCOUNTER — Other Ambulatory Visit: Payer: Self-pay

## 2021-01-19 ENCOUNTER — Telehealth (INDEPENDENT_AMBULATORY_CARE_PROVIDER_SITE_OTHER): Payer: Medicare Other | Admitting: Psychiatry

## 2021-01-19 DIAGNOSIS — F325 Major depressive disorder, single episode, in full remission: Secondary | ICD-10-CM | POA: Diagnosis not present

## 2021-01-19 MED ORDER — MIRTAZAPINE 30 MG PO TBDP: 30.0000 mg | ORAL_TABLET | Freq: Every day | ORAL | 8 refills | Status: DC

## 2021-01-19 MED ORDER — VORTIOXETINE HBR 20 MG PO TABS: 20.0000 mg | ORAL_TABLET | Freq: Every day | ORAL | 1 refills | Status: DC

## 2021-01-19 NOTE — Progress Notes (Signed)
.  Psychiatric Initial Adult Assessment   Patient Identification: [name] MRN:  [id number] Date of Evaluation:  01/19/2021 Referral Source: Dr. Wells Guiles Tat Chief Complaint:   Visit Diagnosis: Major depression mild  Today patient is doing very well.  We spoke to him together with his wife [name].  Patient is sleeping and eating well.  He denies daily depression.  He does describe fatigue.  Overall though the patient is functioning well.  He does all his basic ADLs.  Him and his wife are considering moving to a long-term care facility.  They are looking at facilities in our community.  They want a place that has a skilled nursing home as part of it.  The patient has no children.  They have a cat is doing well.  The patient has no evidence of psychosis.  He is functioning well.  He is a very good relationship with his wife.  Depression Symptoms:  fatigue, (Hypo) Manic Symptoms:   Anxiety Symptoms:   Psychotic Symptoms:   PTSD Symptoms:   Past Psychiatric History: Trintellix, Wellbutrin  Previous Psychotropic Medications:   Substance Abuse History in the last 12 months:    Consequences of Substance Abuse:   Past Medical History:  Past Medical History:  Diagnosis Date   Allergy    Alzheimer disease ([place]) 06/2018   diagnosed with early stage alzheimers   Arthritis    osteoarthritis   Cancer ([place])    testicular, age 20, orchiectomy 1992   Cataract    B/L CATARACTS NO SURGERY   Dementia ([place])    Depression    Dyslipidemia    Heartburn    TAKES ZANTAC   History of radiation therapy 12/02/18- 01/14/19   Head and neck/ base of tongue. 60 Gy over 30 fractions.    Hypertension    Hypogonadism male    Prostate cancer ([place]) 06/07/11   gleason 3+3=6, vol 59.5 cc   Sleep apnea    Status post chemotherapy    testicular cancer 1992, Dr Beryle Beams    Past Surgical History:  Procedure Laterality Date   APPENDECTOMY     COLONOSCOPY  2007   gessner   DIRECT LARYNGOSCOPY N/A  08/19/2018   Procedure: DIRECT LARYNGOSCOPY;  Surgeon: Leta Baptist, MD;  Location: [facility];  Service: ENT;  Laterality: N/A;   INGUINAL HERNIA REPAIR     w/orchiectomy 1992, retroperitoneal lymph node dissection   IR GASTROSTOMY TUBE REMOVAL  02/28/2019   MENISECTOMY     R&L knee surgeries   PROSTATE SURGERY     biopsy x 2   TONGUE BIOPSY N/A 08/19/2018   Procedure: BIOPSY OF TONGUE BASE MASS;  Surgeon: Leta Baptist, MD;  Location: [facility];  Service: ENT;  Laterality: N/A;    Family Psychiatric History:   Family History:  Family History  Problem Relation Age of Onset   Lung cancer Mother    Hypertension Mother    Lung cancer Father    Heart failure Father    Hypertension Father     Social History:   Social History   Socioeconomic History   Marital status: Married    Spouse name: Not on file   Number of children: 0   Years of education: Not on file   Highest education level: Not on file  Occupational History   Occupation: RETIRED    Employer: [employer]  Tobacco Use   Smoking status: Former    Packs/day: 1.00    Years:  20.00    Pack years: 20.00    Types: Cigarettes    Quit date: 08/03/1999    Years since quitting: 21.4   Smokeless tobacco: Never  Vaping Use   Vaping Use: Never used  Substance and Sexual Activity   Alcohol use: Not Currently   Drug use: No   Sexual activity: Yes  Other Topics Concern   Not on file  Social History Narrative   Not on file   Social Determinants of Health   Financial Resource Strain: Not on file  Food Insecurity: Not on file  Transportation Needs: Not on file  Physical Activity: Not on file  Stress: Not on file  Social Connections: Not on file    Additional Social History:   Allergies:  No Known Allergies  Metabolic Disorder Labs: No results found for: HGBA1C, MPG No results found for: PROLACTIN Lab Results  Component Value Date   CHOL 118 11/16/2020   TRIG 90 11/16/2020    HDL 51 11/16/2020   CHOLHDL 2.3 11/16/2020   VLDL 18 09/11/2016   LDLCALC 50 11/16/2020   LDLCALC 48 08/21/2019   Lab Results  Component Value Date   TSH 6.540 (H) 11/16/2020    Therapeutic Level Labs: No results found for: LITHIUM No results found for: CBMZ No results found for: VALPROATE  Current Medications: Current Outpatient Medications  Medication Sig Dispense Refill   carboxymethylcellulose (REFRESH PLUS) 0.5 % SOLN 1 drop 3 (three) times daily as needed. One drop each eye 2-3 times daily     cholecalciferol (VITAMIN D3) 25 MCG (1000 UNIT) tablet Take 1,000 Units by mouth daily.     donepezil (ARICEPT) 10 MG tablet Take 1 tablet (10 mg total) by mouth at bedtime. 90 tablet 3   famotidine (PEPCID) 10 MG tablet Take 10 mg by mouth daily.     finasteride (PROSCAR) 5 MG tablet TK 1 T PO QD 90 tablet 3   ipratropium (ATROVENT) 0.06 % nasal spray USE 2 SPRAYS IEN BID PRN FOR DRAINAGE     levothyroxine (SYNTHROID) 75 MCG tablet Take 1 tablet (75 mcg total) by mouth daily. 90 tablet 3   mirtazapine (REMERON SOLTAB) 30 MG disintegrating tablet Take 1 tablet (30 mg total) by mouth at bedtime. 30 tablet 8   polyethylene glycol powder (GLYCOLAX/MIRALAX) powder Take 17 g by mouth daily. 3350 g 11   primidone (MYSOLINE) 50 MG tablet Take 1 tablet (50 mg total) by mouth in the morning and at bedtime. 180 tablet 3   rosuvastatin (CRESTOR) 20 MG tablet TAKE 1 TABLET(20 MG) BY MOUTH DAILY 90 tablet 3   sodium fluoride (PREVIDENT 5000 PLUS) 1.1 % CREA dental cream Apply cream to tooth brush. Brush teeth for 2 minutes. Spit out excess. DO NOT rinse afterwards. Repeat nightly. 1 Tube prn   terazosin (HYTRIN) 2 MG capsule Take 1 capsule (2 mg total) by mouth daily. 90 capsule 3   vortioxetine HBr (TRINTELLIX) 20 MG TABS tablet Take 1 tablet (20 mg total) by mouth daily. 90 tablet 1   No current facility-administered medications for this visit.    Musculoskeletal: Strength & Muscle Tone: within  normal limits Gait & Station: normal Patient leans: N/A  Psychiatric Specialty Exam: ROS  There were no vitals taken for this visit.There is no height or weight on file to calculate BMI.  General Appearance: Casual  Eye Contact:  Good  Speech:  Normal Rate  Volume:  Normal  Mood:  Negative  Affect:  Congruent  Thought  Process:  Goal Directed  Orientation:  Full (Time, Place, and Person)  Thought Content:  Logical  Suicidal Thoughts:  No  Homicidal Thoughts:  No  Memory:  NA  Judgement:  Good  Insight:  Fair  Psychomotor Activity:  Normal  Concentration:    Recall:  [place] of Knowledge:Good  Language: Good  Akathisia:  No  Handed:  Right  AIMS (if indicated):  not done  Assets:  Desire for Improvement Financial Resources/Insurance  ADL's:  Intact  Cognition: Impaired,  Moderate  Sleep:     Screenings: Mini-Mental    Flowsheet Row Office Visit from 06/08/2016 in [facility]  Total Score (max 30 points ) 28      PHQ2-9    [place] Visit from 11/16/2020 in [place] Visit from 08/21/2019 in [place] Visit from 09/18/2018 in [place] Visit from 09/17/2017 in [name] Visit from 09/11/2016 in [facility]  PHQ-2 Total Score 0 4 6 4 4   PHQ-9 Total Score 3 8 19 10  --       Assessment and Plan:   This patient's first problem is that of major depression.  At this time he seems to be in remission.  He takes Trintellix and Remeron regularly.  The patient continues in psychiatric care with me but sees Dr. Carles Collet for neurology.  The patient is on Aricept at this time.  The patient is functioning actually very well.  His mood disorder is very well controlled.

## 2021-01-24 IMAGING — DX DG RIBS W/ CHEST 3+V*R*
4 series · 4 of 4 positions shown · non-contrast
Comparison: PET-CT dated September 16, 2018.

CLINICAL DATA: Right lower anterior rib pain after object fell on
the patient.

EXAM:
RIGHT RIBS AND CHEST - 3+ VIEW

[dg ribs unilateral w/chest right (1 of 4)]
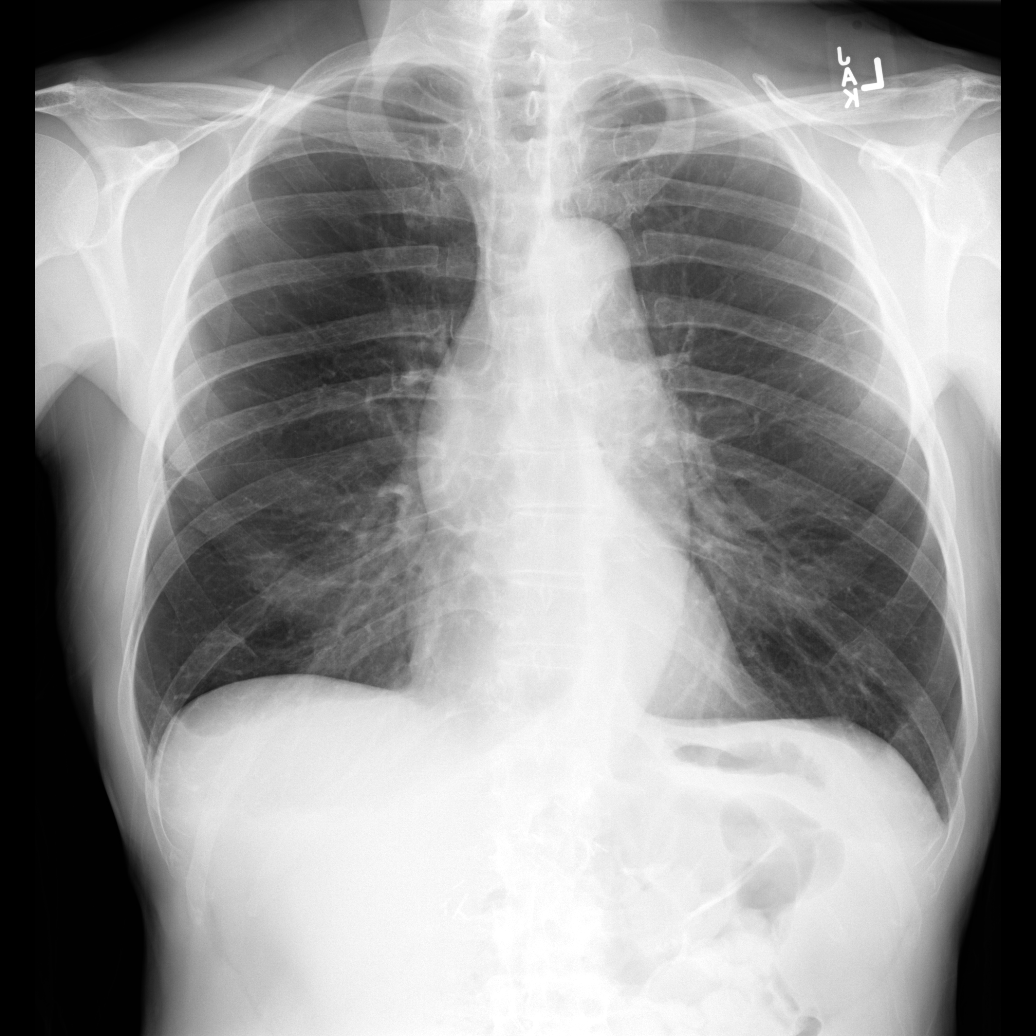

[dg ribs unilateral w/chest right (2 of 4)]
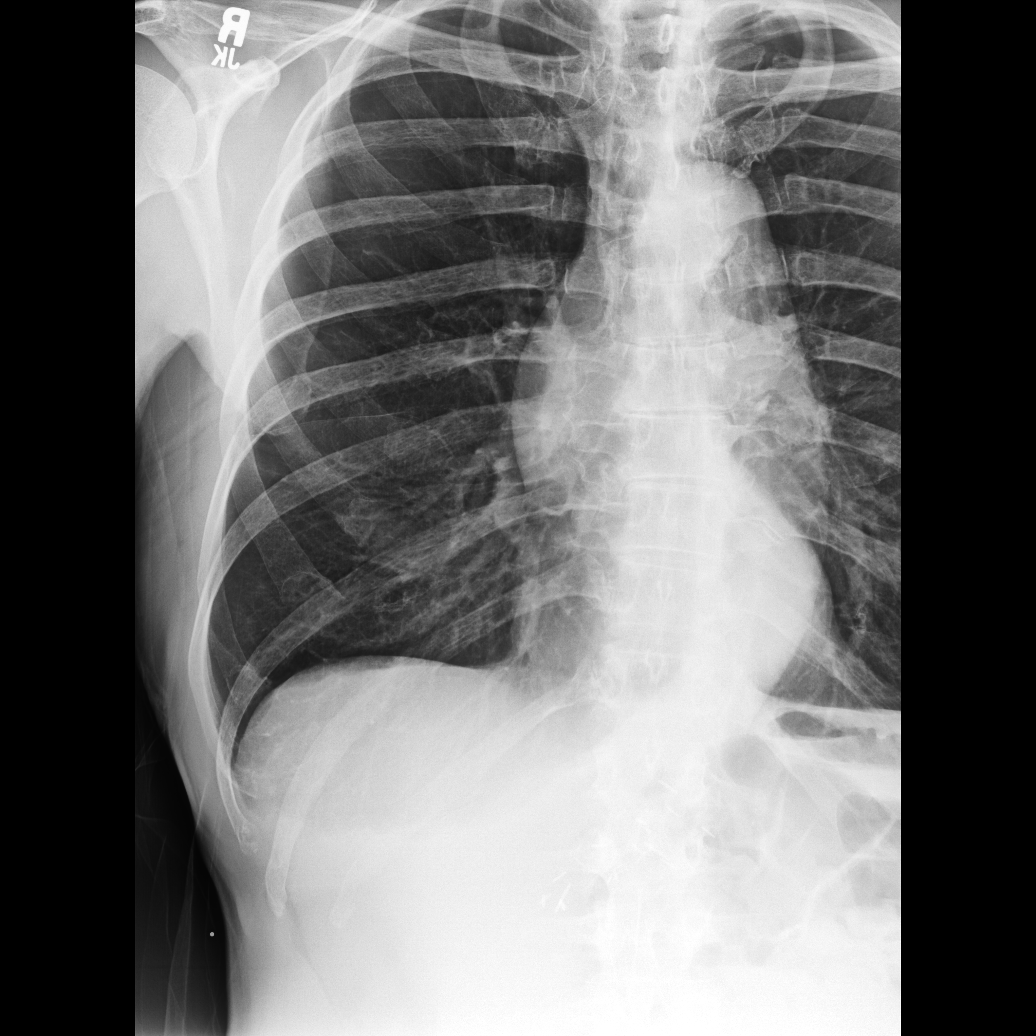

[dg ribs unilateral w/chest right (3 of 4)]
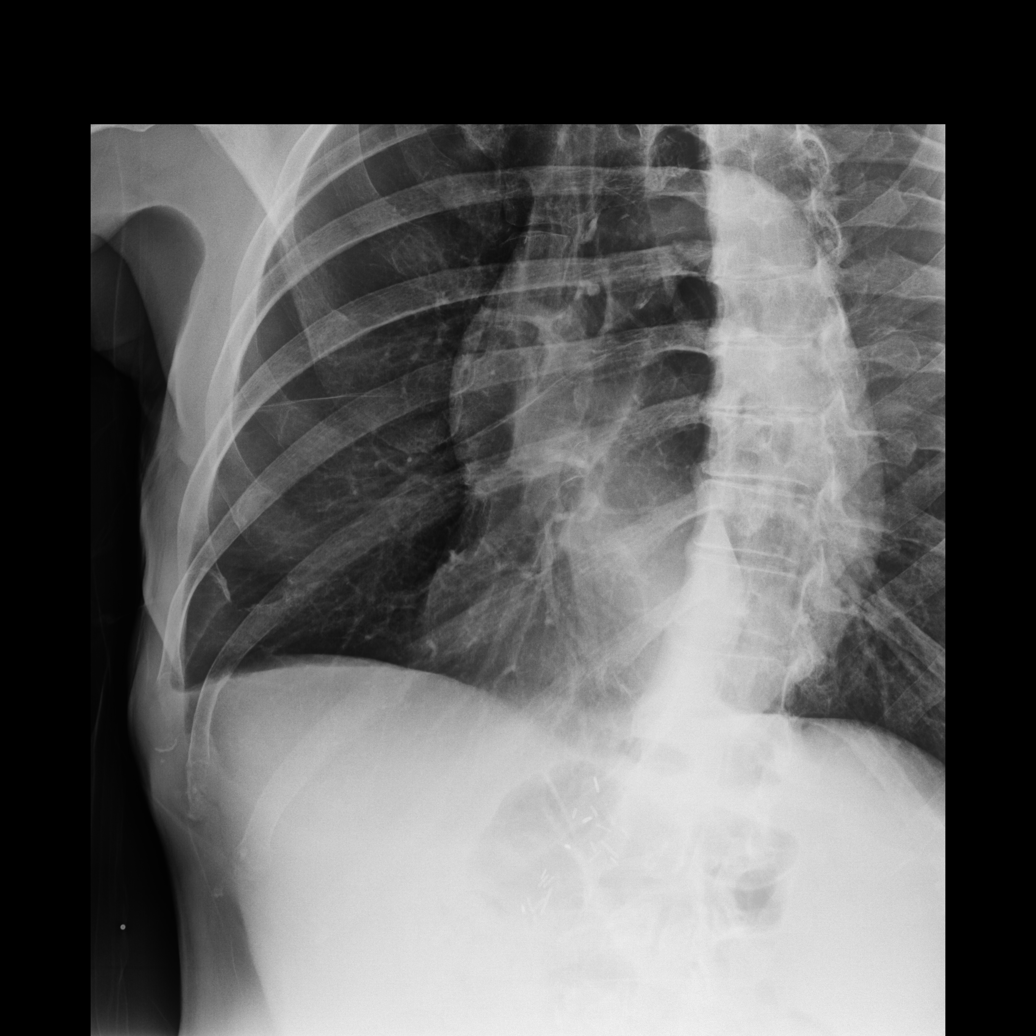

[dg ribs unilateral w/chest right (4 of 4)]
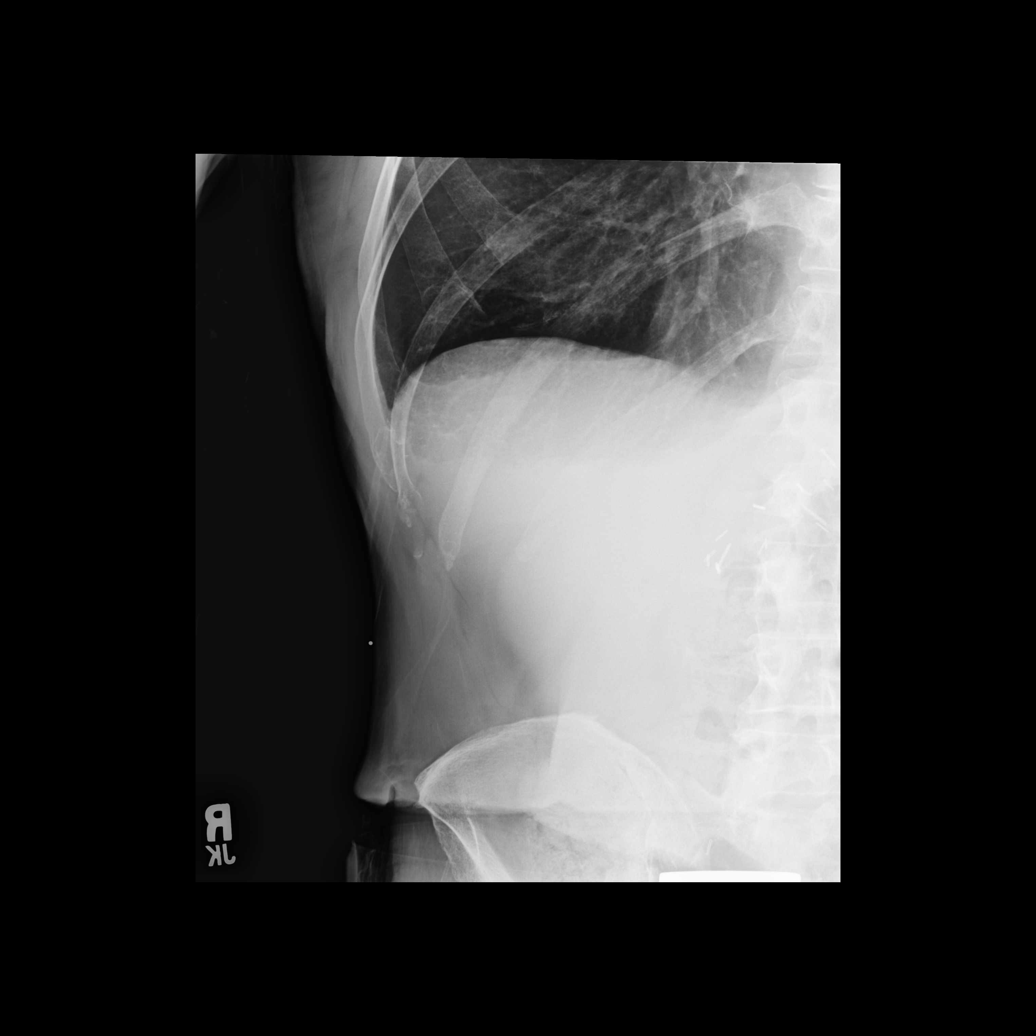

[4 of 4 positions shown; findings below may reference images not displayed]

FINDINGS: No fracture or other bone lesions are seen involving the ribs.

There is no evidence of pneumothorax or pleural effusion. Both lungs
are clear. Attenuation of the bilateral upper lobe pulmonary
vasculature, consistent with emphysema. Heart size and mediastinal
contours are within normal limits.
IMPRESSION: Negative.

## 2021-01-28 ENCOUNTER — Other Ambulatory Visit: Payer: Self-pay

## 2021-01-28 ENCOUNTER — Ambulatory Visit
Admission: RE | Admit: 2021-01-28 | Discharge: 2021-01-28 | Disposition: A | Discharge status: Home / Self Care | Payer: Medicare Other | Source: Ambulatory Visit | Attending: Radiation Oncology | Admitting: Radiation Oncology

## 2021-01-28 VITALS — BP 131/85 | HR 67 | Temp 97.1°F | Resp 18 | Ht 65.5 in | Wt 150.0 lb

## 2021-01-28 DIAGNOSIS — Z85810 Personal history of malignant neoplasm of tongue: Secondary | ICD-10-CM | POA: Diagnosis not present

## 2021-01-28 DIAGNOSIS — Z79899 Other long term (current) drug therapy: Secondary | ICD-10-CM | POA: Diagnosis not present

## 2021-01-28 DIAGNOSIS — Z923 Personal history of irradiation: Secondary | ICD-10-CM | POA: Diagnosis not present

## 2021-01-28 DIAGNOSIS — C01 Malignant neoplasm of base of tongue: Secondary | ICD-10-CM

## 2021-01-28 NOTE — Progress Notes (Signed)
Mr. [name] presents for follow up of radiation completed on 01/14/2019 to his base of tongue/ head and neck.  Pain issues, if any: Continues to have occasional spasming along surgical incision, and generalized joint pain Using a feeding tube?: N/A Weight changes, if any: reports a stable appetit Wt Readings from Last 3 Encounters:  01/28/21 150 lb (68 kg)  11/16/20 149 lb 12.8 oz (67.9 kg)  09/21/20 153 lb (69.4 kg)   Swallowing issues, if any: Denies any new or worsening symptoms. Reports occasional difficulty when swallowing his medications, but otherwise reports he can eat/drink a wide variety Smoking or chewing tobacco? None Using fluoride trays daily? Briant Sites sees his community dentist every 3 months Last ENT visit was on: 11/10/2020 Saw Dr. Francina Ames:  "--He has done well overall since the last visit. --He is having no pain or sore throat. Some infrequent episodes of cramping/stabbing right neck pain, eg when yawning.  --He is able to swallow most types of foods --No palpable neck masses or enlarged lymph nodes, some swelling under the rightjaw on occasion --Procedure: Flexible fiberoptic laryngoscopy The tongue base and vallecula were visualized and appeared healthy without mucosal masses or lesions. The epiglottis, aryepiglottic folds, hypopharynx, supraglottis, glottis were visualized and appeared healthy without mucosal masses or lesions. Vocal fold mobility was intact and symmetric. The scope waswithdrawn from the nose. --There is no evidence of disease on exam today.  --He sees Dr Pearlie Oyster office in June --Return in 6 months , certainly sooner if there are any questions or problems. --He is agreeable with this plan. All his questions were answered."  Other notable issues, if any: Denies any ear or jaw pain, or difficulty opening his mouth. Denies any signs of swelling under his chin or down his neck. Reports occasional dry mouth, but states he's able to manage. Denies any  changes in bowel habits. Reports increase in urinary frequency, but this is being managed by his urologist Dr. Alinda Money. Denies any issues sleeping, though he does feel he's slowed down overall.   Vitals:   01/28/21 1416  BP: 131/85  Pulse: 67  Resp: 18  Temp: (!) 97.1 F (36.2 C)  SpO2: 97%

## 2021-01-29 ENCOUNTER — Encounter: Payer: Self-pay | Admitting: Radiation Oncology

## 2021-01-29 NOTE — Progress Notes (Signed)
Radiation Oncology         [phone] ________________________________  Name: [name] MRN: [id number]  Date: 04/11/2019  DOB: 1946/01/05  Follow-Up Visit Note in person  CC: Bruce Lung, MD  Bruce Baptist, MD  Diagnosis and Prior Radiotherapy:       ICD-10-CM   1. Malignant neoplasm of base of tongue ([place])  C01     Cancer Staging Malignant neoplasm of base of tongue ([place]) Staging form: Pharynx - P16 Negative Oropharynx, AJCC 8th Edition - Clinical stage from 09/27/2018: Stage IVA (cT2, cN2b, cM0, p16-) - Signed by Eppie Gibson, MD on 09/27/2018 Laterality: Right  Radiation Treatment Dates: 12/02/2018 through 01/14/2019 (post operative) Site Technique Total Dose Dose per Fx Completed Fx Beam Energies  Head & neck: HN_BOT IMRT 60/60 2 30/30 6X   CHIEF COMPLAINT:  Here for follow-up and surveillance of throat cancer  Narrative:   [name] presents for follow up of radiation completed on 01/14/2019 to his base of tongue/ head and neck.  Pain issues, if any: Continues to have occasional spasming along surgical incision, and generalized joint pain Using a feeding tube?: N/A Weight changes, if any: reports a stable appetit Wt Readings from Last 3 Encounters:  01/28/21 150 lb (68 kg)  11/16/20 149 lb 12.8 oz (67.9 kg)  09/21/20 153 lb (69.4 kg)   Swallowing issues, if any: Denies any new or worsening symptoms. Reports occasional difficulty when swallowing his medications, but otherwise reports he can eat/drink a wide variety Smoking or chewing tobacco? None Using fluoride trays daily? Briant Sites sees his community dentist every 3 months Last ENT visit was on: 11/10/2020 Saw Dr. Francina Ames:  "--He has done well overall since the last visit. --He is having no pain or sore throat. Some infrequent episodes of cramping/stabbing right neck pain, eg when yawning.  --He is able to swallow most types of foods --No palpable neck masses or enlarged lymph nodes, some swelling under the  rightjaw on occasion --Procedure: Flexible fiberoptic laryngoscopy The tongue base and vallecula were visualized and appeared healthy without mucosal masses or lesions. The epiglottis, aryepiglottic folds, hypopharynx, supraglottis, glottis were visualized and appeared healthy without mucosal masses or lesions. Vocal fold mobility was intact and symmetric. The scope waswithdrawn from the nose. --There is no evidence of disease on exam today.  --He sees Dr Pearlie Oyster office in June --Return in 6 months , certainly sooner if there are any questions or problems. --He is agreeable with this plan. All his questions were answered."  Other notable issues, if any: Denies any ear or jaw pain, or difficulty opening his mouth. Denies any signs of swelling under his chin or down his neck. Reports occasional dry mouth, but states he's able to manage. Denies any changes in bowel habits. Reports increase in urinary frequency, but this is being managed by his urologist Dr. Alinda Money. Denies any issues sleeping, though he does feel he's slowed down overall.    ALLERGIES:  has No Known Allergies.  Meds: Current Outpatient Medications  Medication Sig Dispense Refill   donepezil (ARICEPT) 10 MG tablet Take 1 tablet (10 mg total) by mouth at bedtime. 90 tablet 2   lidocaine (XYLOCAINE) 2 % solution Patient: Mix 1part 2% viscous lidocaine, 1part H20. Swish & swallow 76mL of diluted mixture, 81min before meals and at bedtime, up to QID 100 mL 5   mirtazapine (REMERON SOLTAB) 30 MG disintegrating tablet Take 1 tablet (30 mg total) by mouth at bedtime. 30 tablet 8  polyethylene glycol powder (GLYCOLAX/MIRALAX) powder Take 17 g by mouth daily. 3350 g 11   primidone (MYSOLINE) 50 MG tablet Take 1 tablet (50 mg total) by mouth 2 (two) times daily. 180 tablet 2   rosuvastatin (CRESTOR) 20 MG tablet TAKE 1 TABLET(20 MG) BY MOUTH DAILY 90 tablet 0   sodium fluoride (PREVIDENT 5000 PLUS) 1.1 % CREA dental cream Apply cream to  tooth brush. Brush teeth for 2 minutes. Spit out excess. DO NOT rinse afterwards. Repeat nightly. 1 Tube prn   terazosin (HYTRIN) 2 MG capsule TAKE 1 CAPSULE(2 MG) BY MOUTH DAILY 90 capsule 1   triamcinolone (NASACORT ALLERGY 24HR) 55 MCG/ACT AERO nasal inhaler Place 1 spray into the nose daily.     vortioxetine HBr (TRINTELLIX) 20 MG TABS tablet Take 1 tablet (20 mg total) by mouth daily. 90 tablet 1   HYDROcodone-acetaminophen (NORCO/VICODIN) 5-325 MG tablet Take 1 tablet by mouth every 6 (six) hours as needed for moderate pain. For up to 5 days     ondansetron (ZOFRAN-ODT) 4 MG disintegrating tablet Take 4 mg by mouth every 8 (eight) hours as needed for nausea.     No current facility-administered medications for this encounter.     Physical Findings: The patient is in no acute distress. Patient is alert and oriented. Vitals:   01/28/21 1416  BP: 131/85  Pulse: 67  Resp: 18  Temp: (!) 97.1 F (36.2 C)  SpO2: 97%   Wt Readings from Last 3 Encounters:  01/28/21 150 lb (68 kg)  11/16/20 149 lb 12.8 oz (67.9 kg)  09/21/20 153 lb (69.4 kg)    General: Alert and pleasant to speak with, in no acute distress HEENT: Head is normocephalic. Mouth and Oropharynx notable for clear mucosa, no tumor  Neck: Neck is notable for no palpable masses   Lymphatics: see Neck Exam Psychiatric: Affect is appropriate. Heart: RRR Chest: CTAB   Lab Findings:   Lab Results  Component Value Date   TSH 6.540 (H) 11/16/2020    Radiographic Findings:   NA  Impression/Plan:    1) Head and Neck Cancer Status:   no evidence of disease   2) Nutritional Status: No issues, PEG removed.     3) Swallowing: Good function. Continue swallowing exercises per [facility] speech-language pathology   4) Dental: Encouraged to continue regular followup with dentistry, and dental hygiene including fluoride treatment.   He and his wife know to see dentist at least q 46mo due to prior radiation history.  5)  Thyroid function:  following with Dr Redmond School, PCP Lab Results  Component Value Date   TSH 6.540 (H) 11/16/2020    6) Other: He has follow-up with [facility]NT. Given that he is at 2 y post treatment and NED, his [place] appts moving forward will be in our survivorship clinic. I will see him back PRN. The patient and his wife were encouraged to call with any issues or questions.   On date of service, in total, I spent 30 minutes on this encounter.  Patient was seen in person. _____________________________________   Eppie Gibson, MD

## 2021-02-02 ENCOUNTER — Telehealth: Payer: Self-pay | Admitting: Nurse Practitioner

## 2021-02-02 NOTE — Telephone Encounter (Signed)
Scheduled appt per 6/27 sch msg. Called pt, no answer. Left msg with appt date and time.  

## 2021-03-16 ENCOUNTER — Other Ambulatory Visit: Payer: Self-pay | Admitting: Adult Health

## 2021-03-16 DIAGNOSIS — C01 Malignant neoplasm of base of tongue: Secondary | ICD-10-CM

## 2021-03-16 LAB — PSA: PSA: 2.88

## 2021-05-13 ENCOUNTER — Other Ambulatory Visit (HOSPITAL_COMMUNITY): Payer: Self-pay | Admitting: Psychiatry

## 2021-06-13 HISTORY — PX: OTHER SURGICAL HISTORY: SHX169

## 2021-06-21 ENCOUNTER — Other Ambulatory Visit: Payer: Self-pay

## 2021-06-21 ENCOUNTER — Ambulatory Visit (HOSPITAL_BASED_OUTPATIENT_CLINIC_OR_DEPARTMENT_OTHER): Payer: Medicare Other | Admitting: Psychiatry

## 2021-06-21 DIAGNOSIS — F325 Major depressive disorder, single episode, in full remission: Secondary | ICD-10-CM | POA: Diagnosis not present

## 2021-06-21 MED ORDER — MIRTAZAPINE 30 MG PO TBDP: ORAL_TABLET | ORAL | 8 refills | Status: DC

## 2021-06-21 MED ORDER — VORTIOXETINE HBR 20 MG PO TABS: 20.0000 mg | ORAL_TABLET | Freq: Every day | ORAL | 1 refills | Status: DC

## 2021-06-21 NOTE — Progress Notes (Signed)
.  Psychiatric Initial Adult Assessment   Patient Identification: [name] MRN:  [id number] Date of Evaluation:  06/21/2021 Referral Source: Dr. Wells Guiles Tat Chief Complaint:   Visit Diagnosis: Major depression mild Today the patient is seen with his wife [name].  The patient is actually doing very well.  His mood is great.  He is cancer free.  He had a malignancy in his tongue and esophagus.  He seems to be unimpaired by this condition.  His health overall is good.  Financially he is stable.  He has got a good relationship with his wife who is with him today.  They have been married over 20 years and it is a positive relationship.  They have no children.  The patient likes the home they are living in.  The patient denies any problems with sleep or appetite.  He has got good energy.  He has no evidence of psychosis.  He has been diagnosed with Alzheimer's dementia by his neurologist.  He takes Aricept.  Patient has no auditory or visual hallucinations of any nature.  He drinks no alcohol and uses no drugs.  He has been taking Aricept since 2019.  He takes a thyroid supplement as well.  The patient takes care of all his basic ADLs.  He however does not drive.  His wife take care of the bills and the pills.  Patient does not cook.  His wife does all the activities at home.  The patient is in good spirits.  He has a good sense of humor.  His wife [name] says his short-term memory has gotten worse over the last 1 year.  They have a return appointment with her neurologist in the next 6 months.  I believe the patient is doing well and is fairly stable.  Depression Symptoms:  fatigue, (Hypo) Manic Symptoms:   Anxiety Symptoms:   Psychotic Symptoms:   PTSD Symptoms:   Past Psychiatric History: Trintellix, Wellbutrin  Previous Psychotropic Medications:   Substance Abuse History in the last 12 months:    Consequences of Substance Abuse:   Past Medical History:  Past Medical History:   Diagnosis Date   Allergy    Alzheimer disease ([place]) 06/2018   diagnosed with early stage alzheimers   Arthritis    osteoarthritis   Cancer ([place])    testicular, age 69, orchiectomy 1992   Cataract    B/L CATARACTS NO SURGERY   Dementia ([place])    Depression    Dyslipidemia    Heartburn    TAKES ZANTAC   History of radiation therapy 12/02/18- 01/14/19   Head and neck/ base of tongue. 60 Gy over 30 fractions.    Hypertension    Hypogonadism male    Prostate cancer ([place]) 06/07/11   gleason 3+3=6, vol 59.5 cc   Sleep apnea    Status post chemotherapy    testicular cancer 1992, Dr Beryle Beams    Past Surgical History:  Procedure Laterality Date   APPENDECTOMY     COLONOSCOPY  2007   gessner   DIRECT LARYNGOSCOPY N/A 08/19/2018   Procedure: DIRECT LARYNGOSCOPY;  Surgeon: Leta Baptist, MD;  Location: [place];  Service: ENT;  Laterality: N/A;   INGUINAL HERNIA REPAIR     w/orchiectomy 1992, retroperitoneal lymph node dissection   IR GASTROSTOMY TUBE REMOVAL  02/28/2019   MENISECTOMY     R&L knee surgeries   PROSTATE SURGERY     biopsy x 2   TONGUE BIOPSY N/A 08/19/2018  Procedure: BIOPSY OF TONGUE BASE MASS;  Surgeon: Leta Baptist, MD;  Location: [place];  Service: ENT;  Laterality: N/A;    Family Psychiatric History:   Family History:  Family History  Problem Relation Age of Onset   Lung cancer Mother    Hypertension Mother    Lung cancer Father    Heart failure Father    Hypertension Father     Social History:   Social History   Socioeconomic History   Marital status: Married    Spouse name: Not on file   Number of children: 0   Years of education: Not on file   Highest education level: Not on file  Occupational History   Occupation: RETIRED    Employer: [employer]  Tobacco Use   Smoking status: Former    Packs/day: 1.00    Years: 20.00    Pack years: 20.00    Types: Cigarettes    Quit date: 08/03/1999    Years  since quitting: 21.8   Smokeless tobacco: Never  Vaping Use   Vaping Use: Never used  Substance and Sexual Activity   Alcohol use: Not Currently   Drug use: No   Sexual activity: Yes  Other Topics Concern   Not on file  Social History Narrative   Not on file   Social Determinants of Health   Financial Resource Strain: Not on file  Food Insecurity: Not on file  Transportation Needs: Not on file  Physical Activity: Not on file  Stress: Not on file  Social Connections: Not on file    Additional Social History:   Allergies:  No Known Allergies  Metabolic Disorder Labs: No results found for: HGBA1C, MPG No results found for: PROLACTIN Lab Results  Component Value Date   CHOL 118 11/16/2020   TRIG 90 11/16/2020   HDL 51 11/16/2020   CHOLHDL 2.3 11/16/2020   VLDL 18 09/11/2016   LDLCALC 50 11/16/2020   LDLCALC 48 08/21/2019   Lab Results  Component Value Date   TSH 6.540 (H) 11/16/2020    Therapeutic Level Labs: No results found for: LITHIUM No results found for: CBMZ No results found for: VALPROATE  Current Medications: Current Outpatient Medications  Medication Sig Dispense Refill   carboxymethylcellulose (REFRESH PLUS) 0.5 % SOLN 1 drop 3 (three) times daily as needed. One drop each eye 2-3 times daily     cholecalciferol (VITAMIN D3) 25 MCG (1000 UNIT) tablet Take 1,000 Units by mouth daily.     donepezil (ARICEPT) 10 MG tablet Take 1 tablet (10 mg total) by mouth at bedtime. 90 tablet 3   famotidine (PEPCID) 10 MG tablet Take 10 mg by mouth daily.     finasteride (PROSCAR) 5 MG tablet TK 1 T PO QD 90 tablet 3   ipratropium (ATROVENT) 0.06 % nasal spray USE 2 SPRAYS IEN BID PRN FOR DRAINAGE     levothyroxine (SYNTHROID) 75 MCG tablet Take 1 tablet (75 mcg total) by mouth daily. 90 tablet 3   mirtazapine (REMERON SOL-TAB) 30 MG disintegrating tablet DISSOLVE 1 TABLET(30 MG) ON THE TONGUE AT BEDTIME 30 tablet 8   polyethylene glycol powder (GLYCOLAX/MIRALAX)  powder Take 17 g by mouth daily. 3350 g 11   primidone (MYSOLINE) 50 MG tablet Take 1 tablet (50 mg total) by mouth in the morning and at bedtime. 180 tablet 3   rosuvastatin (CRESTOR) 20 MG tablet TAKE 1 TABLET(20 MG) BY MOUTH DAILY 90 tablet 3   sodium fluoride (PREVIDENT  5000 PLUS) 1.1 % CREA dental cream Apply cream to tooth brush. Brush teeth for 2 minutes. Spit out excess. DO NOT rinse afterwards. Repeat nightly. 1 Tube prn   terazosin (HYTRIN) 2 MG capsule Take 1 capsule (2 mg total) by mouth daily. 90 capsule 3   vortioxetine HBr (TRINTELLIX) 20 MG TABS tablet Take 1 tablet (20 mg total) by mouth daily. 90 tablet 1   No current facility-administered medications for this visit.    Musculoskeletal: Strength & Muscle Tone: within normal limits Gait & Station: normal Patient leans: N/A  Psychiatric Specialty Exam: ROS  There were no vitals taken for this visit.There is no height or weight on file to calculate BMI.  General Appearance: Casual  Eye Contact:  Good  Speech:  Normal Rate  Volume:  Normal  Mood:  Negative  Affect:  Congruent  Thought Process:  Goal Directed  Orientation:  Full (Time, Place, and Person)  Thought Content:  Logical  Suicidal Thoughts:  No  Homicidal Thoughts:  No  Memory:  NA  Judgement:  Good  Insight:  Fair  Psychomotor Activity:  Normal  Concentration:    Recall:  [place] of Knowledge:Good  Language: Good  Akathisia:  No  Handed:  Right  AIMS (if indicated):  not done  Assets:  Desire for Improvement Financial Resources/Insurance  ADL's:  Intact  Cognition: Impaired,  Moderate  Sleep:     Screenings: Mini-Mental    Flowsheet Row Office Visit from 06/08/2016 in [place]  Total Score (max 30 points ) 28      PHQ2-9    [place] Visit from 11/16/2020 in [place] Visit from 08/21/2019 in [place] Visit from 09/18/2018 in [place] Visit from  09/17/2017 in [place] Visit from 09/11/2016 in [place]  PHQ-2 Total Score 0 4 6 4 4   PHQ-9 Total Score 3 8 19 10  --       Assessment and Plan:   This patient's problem is major depression in remission.  He will continue taking Remeron as well as Trintellix.  These 2 medicines together have led the patient into a state of remission.  He will return to see me in 6 months.

## 2021-07-08 ENCOUNTER — Telehealth: Payer: Self-pay | Admitting: Nurse Practitioner

## 2021-07-08 NOTE — Telephone Encounter (Signed)
Rescheduled upcoming appointment due to provider on PAL. Patient's wife is aware of changes.

## 2021-07-19 ENCOUNTER — Encounter: Payer: Self-pay | Admitting: Orthopaedic Surgery

## 2021-07-19 ENCOUNTER — Telehealth: Payer: Self-pay | Admitting: *Deleted

## 2021-07-19 ENCOUNTER — Ambulatory Visit: Payer: Self-pay

## 2021-07-19 ENCOUNTER — Other Ambulatory Visit: Payer: Self-pay

## 2021-07-19 ENCOUNTER — Ambulatory Visit (INDEPENDENT_AMBULATORY_CARE_PROVIDER_SITE_OTHER): Payer: Medicare Other | Admitting: Orthopaedic Surgery

## 2021-07-19 DIAGNOSIS — G8929 Other chronic pain: Secondary | ICD-10-CM

## 2021-07-19 DIAGNOSIS — M25511 Pain in right shoulder: Secondary | ICD-10-CM | POA: Insufficient documentation

## 2021-07-19 NOTE — Progress Notes (Signed)
Office Visit Note   Patient: [name]           Date of Birth: 10/14/1945           MRN: [id number] Visit Date: 07/19/2021              Requested by: Denita Lung, MD [id number] [place],  [place] [id number] PCP: Denita Lung, MD   Assessment & Plan: Visit Diagnoses:  1. Chronic right shoulder pain     Plan: Mr. Oelkers is accompanied by his wife and is seen for evaluation of right shoulder pain.  He first noted insidious onset within the last month or so.  He notes that on occasion he will have some subacromial pain with some discomfort referred into the arm.  He does not have any neck issues presently.  He occasionally will have some trouble sleeping.  His exam was relatively benign with only minimal impingement symptoms.  He had good strength and no crepitation or subacromial pain.  X-rays demonstrated some mild superior migration and that could be consistent with a small rotator cuff tear.  Long discussion regarding the different diagnostic possibilities.  They like to avoid the cortisone if possible and try Tylenol and heat and continue with his exercises at home.  I have told him the neck step would be a subacromial cortisone injection and even possibly an MRI scan.  Answered all questions  Follow-Up Instructions: Return if symptoms worsen or fail to improve.   Orders:  Orders Placed This Encounter  Procedures   XR Shoulder Right   No orders of the defined types were placed in this encounter.     Procedures: No procedures performed   Clinical Data: No additional findings.   Subjective: Chief Complaint  Patient presents with   Right Shoulder - Pain  Patient presents today for right shoulder pain. He said that it has been hurting for a couple months. No known injury. His pain is located posteriorly and into his proximal arm. He said that he hurts more use use, but also hurts with rest. He is not taking anything for pain. He is left hand dominant. No  previous shoulder surgery.  [name] accompanied her husband and relates that he is in the early stages of Alzheimer's and is somewhat forgetful and terms of his symptoms.  She was able to help [place] with his symptoms  HPI  Review of Systems   Objective: Vital Signs: There were no vitals taken for this visit.  Physical Exam Constitutional:      Appearance: He is well-developed.  Pulmonary:     Effort: Pulmonary effort is normal.  Skin:    General: Skin is warm and dry.  Neurological:     Mental Status: He is alert and oriented to person, place, and time.  Psychiatric:        Behavior: Behavior normal.    Ortho Exam right shoulder with quick overhead flexion comparable to the left should asymptomatic side.  Very minimal impingement on the extreme of external rotation but no crepitation.  No AC joint pain or subacromial discomfort.  Biceps intact.  Negative Speed sign and empty can testing.  Some subacromial pain with abduction to 90 degrees.  Good grip and release.  Specialty Comments:  No specialty comments available.  Imaging: XR Shoulder Right  Result Date: 07/19/2021 Numbness of the right shoulder obtained in several projections.  There may be slight superior migration of the humeral head in relation to  the glenoid.  No obvious degenerative changes of the glenohumeral joint with a normal joint space.  There are some degenerative changes at the [facility] joint.  Also appears to have a normal space between the humeral head and the acromion.  No ectopic calcification or acute changes    PMFS History: Patient Active Problem List   Diagnosis Date Noted   Pain in right shoulder 07/19/2021   Other specified hypothyroidism 11/16/2020   GERD (gastroesophageal reflux disease) 10/16/2018   Malignant neoplasm of base of tongue ([place]) 09/27/2018   Aortic atherosclerosis ([place]) 08/08/2018   History of testicular cancer 08/08/2018   Alzheimer's dementia ([place]) 09/11/2016   Essential tremor  09/11/2016   Hyperreflexia 06/08/2016   Family history of heart disease in male family member before age 73 08/31/2015   Depression 10/19/2014   OSA (obstructive sleep apnea) 08/24/2014   Hypertension    Arthritis    History of prostate cancer 06/07/2011   Hyperlipidemia 12/28/2010   Benign prostatic hyperplasia with urinary retention 12/28/2010   Hypogonadism in male 12/28/2010   Past Medical History:  Diagnosis Date   Allergy    Alzheimer disease ([place]) 06/2018   diagnosed with early stage alzheimers   Arthritis    osteoarthritis   Cancer ([place])    testicular, age 75, orchiectomy 1992   Cataract    B/L CATARACTS NO SURGERY   Dementia ([place])    Depression    Dyslipidemia    Heartburn    TAKES ZANTAC   History of radiation therapy 12/02/18- 01/14/19   Head and neck/ base of tongue. 60 Gy over 30 fractions.    Hypertension    Hypogonadism male    Prostate cancer ([place]) 06/07/11   gleason 3+3=6, vol 59.5 cc   Sleep apnea    Status post chemotherapy    testicular cancer 1992, Dr Beryle Beams    Family History  Problem Relation Age of Onset   Lung cancer Mother    Hypertension Mother    Lung cancer Father    Heart failure Father    Hypertension Father     Past Surgical History:  Procedure Laterality Date   APPENDECTOMY     COLONOSCOPY  2007   gessner   DIRECT LARYNGOSCOPY N/A 08/19/2018   Procedure: DIRECT LARYNGOSCOPY;  Surgeon: Leta Baptist, MD;  Location: [facility];  Service: ENT;  Laterality: N/A;   INGUINAL HERNIA REPAIR     w/orchiectomy 1992, retroperitoneal lymph node dissection   IR GASTROSTOMY TUBE REMOVAL  02/28/2019   MENISECTOMY     R&L knee surgeries   PROSTATE SURGERY     biopsy x 2   TONGUE BIOPSY N/A 08/19/2018   Procedure: BIOPSY OF TONGUE BASE MASS;  Surgeon: Leta Baptist, MD;  Location: [facility];  Service: ENT;  Laterality: N/A;   Social History   Occupational History   Occupation: RETIRED    Employer: [employer]  Tobacco Use   Smoking status: Former    Packs/day: 1.00    Years: 20.00    Pack years: 20.00    Types: Cigarettes    Quit date: 08/03/1999    Years since quitting: 21.9   Smokeless tobacco: Never  Vaping Use   Vaping Use: Never used  Substance and Sexual Activity   Alcohol use: Not Currently   Drug use: No   Sexual activity: Yes

## 2021-08-02 ENCOUNTER — Inpatient Hospital Stay: Payer: Medicare Other | Admitting: Nurse Practitioner

## 2021-08-05 ENCOUNTER — Telehealth: Payer: Self-pay | Admitting: *Deleted

## 2021-08-10 NOTE — Progress Notes (Signed)
° ° °Assessment/Plan:  ° ° °1.  Essential Tremor ° -Continue primidone, 50 mg twice per day. ° °2.  Memory change/dementia ° -Continue donepezil, 10 mg daily. ° -Start Namenda and work up to 10 mg twice per day.  Discussed risk, benefits, side effects. ° -talked to them about importance of mental an physical exercise and exactly what that meant ° Discussed moves to assisted living ° -discussed careful with the [place] ° -Met with my LCSW today.  Wife is overwhelmed with caregiving. ° °3.  Major depression ° -Following with Dr. Plovsky ° °4.  Follow-up with Sarah in 6 months. ° °Subjective:  ° °[name] was seen today in follow up for essential tremor and memory change/dementia.  My previous records were reviewed prior to todays visit.  Last seen in the office about 1 year ago.  Pt thinks that tremor is about the same but wife thinks that it is worse.   She states that he occasionally has to drink with L hand instead of the R (but he is L handed).  On donepezil and wife states that memory is declining.  He will ask day of week.  He will have trouble managing appts.   Wife manages meds but has done that for years.  "There is an attitude of negativity and lack of interest in things."  He is not exercising.  Not much mental exercise per wife but does play mahjong in the AM daily.  Does stay awake all day.  Continues to follow with Dr. Plovsky.  Last saw him November 15.  Notes are reviewed.  No changes were made. ° °Current prescribed movement disorder medications: ° °Primidone, 50 mg twice per day °Donepezil, 10 mg daily ° ° ° °ALLERGIES:  No Known Allergies ° °CURRENT MEDICATIONS:  °Outpatient Encounter Medications as of 08/12/2021  °Medication Sig  ° carboxymethylcellulose (REFRESH PLUS) 0.5 % SOLN 1 drop 3 (three) times daily as needed. One drop each eye 2-3 times daily  ° cholecalciferol (VITAMIN D3) 25 MCG (1000 UNIT) tablet Take 1,000 Units by mouth daily.  ° donepezil (ARICEPT) 10 MG tablet Take 1 tablet (10  mg total) by mouth at bedtime.  ° famotidine (PEPCID) 10 MG tablet Take 10 mg by mouth daily.  ° finasteride (PROSCAR) 5 MG tablet TK 1 T PO QD  ° ipratropium (ATROVENT) 0.06 % nasal spray USE 2 SPRAYS IEN BID PRN FOR DRAINAGE  ° levothyroxine (SYNTHROID) 75 MCG tablet Take 1 tablet (75 mcg total) by mouth daily.  ° mirtazapine (REMERON SOL-TAB) 30 MG disintegrating tablet DISSOLVE 1 TABLET(30 MG) ON THE TONGUE AT BEDTIME  ° polyethylene glycol powder (GLYCOLAX/MIRALAX) powder Take 17 g by mouth daily.  ° primidone (MYSOLINE) 50 MG tablet Take 1 tablet (50 mg total) by mouth in the morning and at bedtime.  ° rosuvastatin (CRESTOR) 20 MG tablet TAKE 1 TABLET(20 MG) BY MOUTH DAILY  ° sodium fluoride (PREVIDENT 5000 PLUS) 1.1 % CREA dental cream Apply cream to tooth brush. Brush teeth for 2 minutes. Spit out excess. DO NOT rinse afterwards. Repeat nightly.  ° terazosin (HYTRIN) 2 MG capsule Take 1 capsule (2 mg total) by mouth daily.  ° vortioxetine HBr (TRINTELLIX) 20 MG TABS tablet Take 1 tablet (20 mg total) by mouth daily.  ° °No facility-administered encounter medications on file as of 08/12/2021.  ° ° ° °Objective:  ° ° °PHYSICAL EXAMINATION:   ° °VITALS:   °Vitals:  ° 08/12/21 1455  °BP: 123/76  °Pulse: 85  °  SpO2: 98%  °Weight: 148 lb (67.1 kg)  °Height: 5' 7" (1.702 m)  ° ° ° °GEN:  The patient appears stated age and is in NAD.  There is tension b/w wife and pt. °HEENT:  Normocephalic, atraumatic.  The mucous membranes are moist. The superficial temporal arteries are without ropiness or tenderness. °CV:  RRR °Lungs:  CTAB °Neck/HEME:  There are no carotid bruits bilaterally. ° °Neurological examination: ° °Orientation: The patient is alert and oriented to person.  He states that it is November, 2021.  He uses laughter and joking to defray from memory change.  He is very pleasant. °Cranial nerves: There is good facial symmetry. The speech is fluent and clear. Soft palate rises symmetrically and there is no tongue  deviation. Hearing is intact to conversational tone. °Sensation: Sensation is intact to light touch throughout °Motor: Strength is at least antigravity x4. ° °Movement examination: °Tone: There is normal tone in the UE/LE °Abnormal movements: No rest tremor.  No postural or intention tremor seen today. °Coordination:  There is no decremation with RAM's °Gait and Station: The patient has no difficulty arising out of a deep-seated chair without the use of the hands. The patient's stride length is good °I have reviewed and interpreted the following labs independently °  Chemistry   °   °Component Value Date/Time  ° NA 140 11/16/2020 1536  ° K 4.5 11/16/2020 1536  ° CL 101 11/16/2020 1536  ° CO2 23 11/16/2020 1536  ° BUN 15 11/16/2020 1536  ° CREATININE 0.83 11/16/2020 1536  ° CREATININE 0.81 04/10/2019 1442  ° CREATININE 0.84 06/08/2016 1425  °    °Component Value Date/Time  ° CALCIUM 9.4 11/16/2020 1536  ° ALKPHOS 83 11/16/2020 1536  ° AST 27 11/16/2020 1536  ° AST 19 09/19/2018 0757  ° ALT 21 11/16/2020 1536  ° ALT 18 09/19/2018 0757  ° BILITOT 0.4 11/16/2020 1536  ° BILITOT 0.5 09/19/2018 0757  °  ° ° °Lab Results  °Component Value Date  ° WBC 3.4 11/16/2020  ° HGB 14.0 11/16/2020  ° HCT 41.9 11/16/2020  ° MCV 93 11/16/2020  ° PLT 188 11/16/2020  ° °Lab Results  °Component Value Date  ° TSH 2.804 08/11/2021  ° °  Chemistry   °   °Component Value Date/Time  ° NA 140 11/16/2020 1536  ° K 4.5 11/16/2020 1536  ° CL 101 11/16/2020 1536  ° CO2 23 11/16/2020 1536  ° BUN 15 11/16/2020 1536  ° CREATININE 0.83 11/16/2020 1536  ° CREATININE 0.81 04/10/2019 1442  ° CREATININE 0.84 06/08/2016 1425  °    °Component Value Date/Time  ° CALCIUM 9.4 11/16/2020 1536  ° ALKPHOS 83 11/16/2020 1536  ° AST 27 11/16/2020 1536  ° AST 19 09/19/2018 0757  ° ALT 21 11/16/2020 1536  ° ALT 18 09/19/2018 0757  ° BILITOT 0.4 11/16/2020 1536  ° BILITOT 0.5 09/19/2018 0757  °  ° ° ° ° ° °Total time spent on today's visit was 30 minutes, including  both face-to-face time and nonface-to-face time.  Time included that spent on review of records (prior notes available to me/labs/imaging if pertinent), discussing treatment and goals, answering patient's questions and coordinating care. ° °Cc:  Lalonde, John C, MD ° °

## 2021-08-11 ENCOUNTER — Inpatient Hospital Stay: Payer: Medicare Other | Attending: Nurse Practitioner | Admitting: Nurse Practitioner

## 2021-08-11 ENCOUNTER — Inpatient Hospital Stay: Payer: Medicare Other

## 2021-08-11 ENCOUNTER — Encounter: Payer: Self-pay | Admitting: Nurse Practitioner

## 2021-08-11 ENCOUNTER — Other Ambulatory Visit: Payer: Self-pay

## 2021-08-11 VITALS — BP 119/87 | HR 73 | Temp 97.6°F | Resp 18 | Ht 65.5 in | Wt 146.9 lb

## 2021-08-11 DIAGNOSIS — Z85818 Personal history of malignant neoplasm of other sites of lip, oral cavity, and pharynx: Secondary | ICD-10-CM | POA: Diagnosis not present

## 2021-08-11 DIAGNOSIS — Z87891 Personal history of nicotine dependence: Secondary | ICD-10-CM | POA: Diagnosis not present

## 2021-08-11 DIAGNOSIS — C01 Malignant neoplasm of base of tongue: Secondary | ICD-10-CM

## 2021-08-11 DIAGNOSIS — M542 Cervicalgia: Secondary | ICD-10-CM

## 2021-08-11 DIAGNOSIS — Z79899 Other long term (current) drug therapy: Secondary | ICD-10-CM | POA: Diagnosis not present

## 2021-08-11 LAB — TSH: TSH: 2.804 u[IU]/mL (ref 0.320–4.118)

## 2021-08-11 NOTE — Progress Notes (Signed)
CLINIC:  H/N Survivorship  Patient Care Team: Denita Lung, MD as PCP - General (Family Medicine) Francina Ames, MD as Referring Physician (Otolaryngology) Eppie Gibson, MD as Attending Physician (Radiation Oncology) Leota Sauers, RN (Inactive) as Oncology Nurse Navigator Lenn Cal, DDS as Consulting Physician (Dentistry) Tat, Eustace Quail, DO as Consulting Physician (Neurology) Alla Feeling, NP as Nurse Practitioner (Nurse Practitioner)   REASON FOR VISIT:  Routine follow-up for history of head & neck cancer.  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 2. Enlarged necrotic lymph nodes at the right level 2 station measuring up to 3.4 cm, consistent with metastatic disease. 3. Pleural based nodule in the anterior left upper lobe likely represents scarring in the setting of centrilobular emphysema. Given mucosal malignancy, CT of the chest with contrast is recommended for further evaluation.   08/09/2018 Imaging   CT chest w/ contrast: IMPRESSION: 1. No definite findings to suggest metastatic disease in the thorax. 2. 4 mm peripheral nodular density in the left lung is probably scar but attention on follow-up recommended. 3. 11 mm low-density lesion posterior right liver, likely benign and probably a cyst. This could also be reassessed at the time of follow-up imaging. 4. Cholelithiasis. 5.  Aortic Atherosclerois (ICD10-170.0)   09/04/2018 Procedure   US-guided FNA of R cervical LN    09/04/2018 Pathology Results   Accession: [id number]  NECK, FINE NEEDLE ASPIRATION, RIGHT CERVICAL NECROTIC ADENOPATHY (SPECIMEN 1 OF 1 COLLECTED 09/04/2018) - MALIGNANT CELLS CONSISTENT WITH METASTATIC SQUAMOUS CELL CARCINOMA -  SEE COMMENT  p16 immunohistochemistry was attempted on the cell block material. There are scattered clusters of squamous cell carcinoma that are NEGATIVE for p16.   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 Focal activity in the RIGHT lingual tonsil consists with primary head and neck carcinoma. 2. Ipsilateral nodal metastasis to a  RIGHT level II lymph node. 3. No evidence of contralateral nodal metastasis. 4. No evidence distant metastatic disease. 5. LEFT upper lobe pulmonary nodule without metabolic activity. Recommend attention on follow-up. 6. Centrilobular emphysema the upper lobes.   09/16/2018 PET scan   IMPRESSION: 1. Focal activity in the RIGHT lingual tonsil consists with primary head and neck carcinoma. 2. Ipsilateral nodal metastasis to a  RIGHT level II lymph node. 3. No evidence of contralateral nodal metastasis. 4. No evidence distant metastatic disease. 5. LEFT upper lobe pulmonary nodule without metabolic activity. Recommend attention on follow-up. 6. Centrilobular emphysema the upper lobes.   10/21/2018 Pathology Results   Procedure: Partial glossectomy. Tumor Site: Oropharynx at base of tongue. Tumor Laterality: Right. Tumor Focality: Unifocal. Tumor Size: Greatest dimension 2.4 cm. Histologic Type: Squamous cell carcinoma. Histologic Grade: P16 positive in primary tumor and metastatic lymph node. Margins: Free of invasive carcinoma. High grade squamous dysplasia involves lateral and distal margins. Tumor Bed: N/A. Tumor Bed margins: N/A. Lymphovascular Invasion: No. Perineural Invasion: No. Regional Lymph Nodes: Number of Lymph Nodes Involved: 3. Number of Lymph Nodes Examined: 42. Laterality of Lymph Nodes Involved: Right. Size of Largest Metastatic Deposit (centimeters): 2.5 cm Extranodal Extension (ENE): Present. Distance from lymph node capsule (millimeters): 0.6  mm. Pathologic Stage Classification (pTNM, AJCC 8th Edition): pT2,  pN1. Ancillary Studies: P16 positive in primary tumor and metastatic lymph node.   11/13/2018 Definitive Surgery   Partial glossectomy - Right oropharynx at base of tongue SCC, p16+ in primary tumor and metastatic LN.  Margins negative, high-grade squamous dysplasia involving lateral distal margins Negative for lymphovascular or perineural invasion Number of lymph nodes involved: 3 Number of lymph nodes examined: 42 TNM stage pT2pN1   12/02/2018 - 01/14/2019 Radiation Therapy   Radiation Treatment Dates: 12/02/2018 through 01/14/2019 (post operative) Site Technique Total Dose Dose per Fx Completed Fx Beam Energies  Head & neck: HN_BOT IMRT 60/60 2 30/30 6X       Imaging   CT soft tissue neck and CT chest IMPRESSION: Right tongue base tumor no longer present. Necrotic lymph nodes on the right no longer visualized. No recurrent adenopathy.   Apparent resection of right submandibular gland and right level 2 lymph nodes.  1. Stable exam. No specific findings to suggest metastatic disease to the abdomen or pelvis. 2. Unchanged 6 mm subpleural nodule in the anterolateral left upper lobe. 3. Aortic Atherosclerosis (ICD10-I70.0) and Emphysema (ICD10-J43.9).   08/05/2020 Imaging   IMPRESSION: 1. Status post treatment of right tongue base neoplasm without residual or recurrent tumor. 2. Post radiation changes about the right carotid artery and right carotid artery are stable. 3. No significant cervical adenopathy. No significant left-sided mass lesion or adenopathy. Asymmetric soft tissue in the left neck may represent the normal left submandibular gland and floor of mouth. 4. Emphysema (ICD10-J43.9).     08/11/2021 Survivorship   SCP visit per Cira Rue, NP   Malignant neoplasm of base of tongue ([place])  09/16/2018 PET scan   IMPRESSION: 1. Focal activity in the RIGHT lingual tonsil consists with primary head and neck  carcinoma. 2. Ipsilateral nodal metastasis to a  RIGHT level II lymph node. 3. No evidence of contralateral nodal metastasis. 4. No evidence distant metastatic disease. 5. LEFT upper lobe pulmonary nodule without metabolic activity. Recommend attention on follow-up. 6. Centrilobular emphysema the upper lobes.   09/27/2018 Initial Diagnosis   Malignant neoplasm of base of tongue ([place])   09/27/2018 Cancer Staging   Staging form: Pharynx - P16 Negative Oropharynx, AJCC 8th Edition - Clinical stage from 09/27/2018: Stage IVA (cT2, cN2b, cM0, p16-) - Signed by Eppie Gibson, MD on 09/27/2018    11/13/2018 Cancer Staging   Staging form: Pharynx - HPV-Mediated Oropharynx, AJCC 8th Edition - Pathologic stage from 11/13/2018: Stage I (pT2, pN1, cM0, p16+) - Signed by Alla Feeling, NP on 08/11/2021    11/13/2018 Definitive Surgery   Partial glossectomy - Right oropharynx at base of tongue SCC, p16+ in primary tumor and metastatic LN.  Margins negative, high-grade squamous dysplasia involving lateral distal margins Negative for lymphovascular or perineural invasion Number of lymph nodes involved: 3 Number of lymph nodes examined: 42 TNM stage pT2pN1   12/02/2018 - 01/14/2019 Radiation Therapy   Radiation Treatment Dates: 12/02/2018 through 01/14/2019 (post operative) Site Technique Total Dose Dose per Fx Completed Fx Beam Energies  Head & neck: HN_BOT IMRT 60/60 2 30/30 6X       Imaging   CT soft tissue neck and CT chest IMPRESSION: Right tongue base tumor no longer present. Necrotic lymph nodes on the right no longer visualized. No recurrent adenopathy.   Apparent resection of right submandibular gland and right level 2 lymph nodes.  1. Stable exam. No specific findings to suggest metastatic disease to the abdomen or pelvis. 2. Unchanged 6 mm  subpleural nodule in the anterolateral left upper lobe. 3. Aortic Atherosclerosis (ICD10-I70.0) and Emphysema (ICD10-J43.9).   08/05/2020 Imaging    IMPRESSION: 1. Status post treatment of right tongue base neoplasm without residual or recurrent tumor. 2. Post radiation changes about the right carotid artery and right carotid artery are stable. 3. No significant cervical adenopathy. No significant left-sided mass lesion or adenopathy. Asymmetric soft tissue in the left neck may represent the normal left submandibular gland and floor of mouth. 4. Emphysema (ICD10-J43.9).     08/11/2021 Survivorship   SCP visit per Cira Rue, NP      INTERVAL HISTORY:  Mr. Fuhs presents with his wife for follow-up as scheduled.  Last seen by my colleague Sandi Mealy, PA 07/22/2020, Dr. Isidore Moos 01/2021, and Dr. Nicolette Bang in ENT 05/2021.  He feels well in general with good appetite, energy has steadily declined.  He is losing some weight, on Ensure once daily and mirtazapine but mostly for sleep which is helpful.  Denies dysphagia.  He notes a palpable mass under the right chin that is "always there" but a new intermittently painful area in the similar location has developed over the last few weeks.  The pain is dull when it occurs.  He denies recent oral, ear, or respiratory infections.  Continues to alternate follow-up with general dentistry and perio every 3 months, soaks his teeth and fluoride.  Last TSH was mildly elevated 6.5 on 11/2020, on Synthroid.  His wife is more worried about his Alzheimer's/dementia than H/N cancer.  -Pain: Denies throat pain. Intermittently painful mass/nodule in R submandibular area began 3 weeks ago -Nutrition/Diet: PO; mirtazapine 30 mg daily.  Mild weight loss 153 on 09/21/2020 today 146 on 08/11/2021.  -Dysphagia?:  Denies -Dental issues?:  None using fluoride trays- yes -Last TSH: 11/16/2020 at 6.540 -Weight: LOSS  -Last ENT visit: 06/01/2021 -Last Rad Onc visit: 01/28/2021 -Last Dentist visit: Every 3 months     CURRENT MEDICATIONS:  Current Outpatient Medications on File Prior to Visit  Medication Sig Dispense Refill    carboxymethylcellulose (REFRESH PLUS) 0.5 % SOLN 1 drop 3 (three) times daily as needed. One drop each eye 2-3 times daily     cholecalciferol (VITAMIN D3) 25 MCG (1000 UNIT) tablet Take 1,000 Units by mouth daily.     donepezil (ARICEPT) 10 MG tablet Take 1 tablet (10 mg total) by mouth at bedtime. 90 tablet 3   famotidine (PEPCID) 10 MG tablet Take 10 mg by mouth daily.     finasteride (PROSCAR) 5 MG tablet TK 1 T PO QD 90 tablet 3   ipratropium (ATROVENT) 0.06 % nasal spray USE 2 SPRAYS IEN BID PRN FOR DRAINAGE     levothyroxine (SYNTHROID) 75 MCG tablet Take 1 tablet (75 mcg total) by mouth daily. 90 tablet 3   mirtazapine (REMERON SOL-TAB) 30 MG disintegrating tablet DISSOLVE 1 TABLET(30 MG) ON THE TONGUE AT BEDTIME 30 tablet 8   polyethylene glycol powder (GLYCOLAX/MIRALAX) powder Take 17 g by mouth daily. 3350 g 11   primidone (MYSOLINE) 50 MG tablet Take 1 tablet (50 mg total) by mouth in the morning and at bedtime. 180 tablet 3   rosuvastatin (CRESTOR) 20 MG tablet TAKE 1 TABLET(20 MG) BY MOUTH DAILY 90 tablet 3   sodium fluoride (PREVIDENT 5000 PLUS) 1.1 % CREA dental cream Apply cream to tooth brush. Brush teeth for 2 minutes. Spit out excess. DO NOT rinse afterwards. Repeat nightly. 1 Tube prn   terazosin (HYTRIN) 2 MG capsule Take 1  capsule (2 mg total) by mouth daily. 90 capsule 3   vortioxetine HBr (TRINTELLIX) 20 MG TABS tablet Take 1 tablet (20 mg total) by mouth daily. 90 tablet 1   No current facility-administered medications on file prior to visit.    ALLERGIES:  No Known Allergies   PHYSICAL EXAM:  Vitals:   08/11/21 1235  BP: 119/87  Pulse: 73  Resp: 18  Temp: 97.6 F (36.4 C)  SpO2: 98%   Filed Weights   08/11/21 1235  Weight: 146 lb 14.4 oz (66.6 kg)   General: Well-appearing male in no acute distress.  Accompanied by his spouse today.  HEENT: Head is atraumatic and normocephalic.  Pupils equal and reactive to light. Conjunctivae clear without exudate.   Sclerae anicteric. Oral mucosa is pink and moist without lesions.  Tongue pink, moist, and midline. Oropharynx is pink and moist, without lesions. Lymph: No preauricular, postauricular, cervical, supraclavicular, or infraclavicular lymphadenopathy noted on palpation.   neck: Scar tissue in the high right neck/submandibular area without palpable mass or adenopathy  Cardiovascular: Normal rate and rhythm. Respiratory: Clear to auscultation bilaterally. breathing non-labored.  GI: Abdomen soft and round. Non-tender, non-distended. Bowel sounds normoactive.  Psych: Normal mood and affect for situation.  Voice is strong, speech is clear Extremities: No edema.  Skin: Warm and dry.    LABORATORY DATA:  TSH is pending  DIAGNOSTIC IMAGING:  None at this visit.    ASSESSMENT & PLAN:  Mr. Kihara is a pleasant 76 y.o. 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  Patient presents to survivorship clinic today for routine follow long-term surveillance  1.  Right tongue base SCC:  Mr. Shin is clinically doing well from a head/neck cancer standpoint.  The area of concern is not appreciable on today's exam, he is being referred for ultrasound soft tissue head/neck to further evaluate.  I will call him with results.  2. Nutritional status: Mr. Phegley reports that he has a good appetite but has mild weigt actuations over the last year.  I recommend to increase Ensure to twice daily with highest caloric count.  He and his wife declined referral to the nutritionist.  He is currently on 30 mg mirtazapine per Dr. Casimiro Needle.    3. At risk for dysphagia: Given Mr. Arquette treatment for tongue cancer, which included surgery and radiation therapy, he is at risk for chronic dysphagia.  He currently denies dysphagia, no need for SLP at this time. If Mr. Safley requires further swallowing or  speech therapy evaluation, I will be happy to place that referral, if needed.   4.  At risk for neck lymphedema:  When patients with head & neck cancers are treated with surgery and/or radiation therapy, there is an associated increased risk of neck lymphedema.  Mr. Markovic is not currently experiencing symptoms. If he develops symptoms in the future, I would happy to place a formal referral to physical therapy for further evaluation and treatment.     5.  At risk for hypothyroidism: The thyroid gland is often affected after treatment for head & neck cancer.  Mr. Cuffe most recent TSH was elevated to 6.5 in 11/2020, he is on Synthroid.  Repeat level today.   6. At risk for tooth decay/dental concerns: After treatment with radiation for head & neck cancers, patients often experience xerostomia which increases their risk of dental  caries. Mr. Schoffstall alternates visits with general dentistry and periodontist every 3 months, he soaks his teeth and fluoride trays.    7.  Lung cancer screening:  [facility] now offers eligible patients lung cancer screening with a low-dose chest CT to aid in early detection, provide more effective treatment options, and ultimately improve survival benefits for patients diagnosed early.  Below is the selection criteria for screening:  Medicare patients: 55-77 years; privately insured patients 55-80 years. Active or former smokers who have quit within the last 15 years. 30+ pack-year history of smoking  Exclusion criteria - No signs/symptoms of lung cancer (i.e., no recent history of hemoptysis and no unexplained weight loss >15 pounds in the last 6 months). Willing and healthy enough to undergo biopsies/surgery if needed.  Last CT chest 07/2020 showed no lung nodules.  Given his ongoing medical conditions including Alzheimer's dementia, I am deferring lung cancer screening CT at this time.  Further discussion per PCP.  8. Tobacco & alcohol use: Mr. Elison reports that he quit  smoking in 2000 and continues to abstain from all tobacco products.   9. Health maintenance and wellness promotion: Cancer patients who consume a diet rich in fruits and vegetables have better overall health and decreased risk of cancer recurrence. Mr. Kluever was encouraged to consume 5-7 servings of fruits and vegetables per day, as tolerated. Mr. Mcmanamon was also encouraged to engage in light exercise as tolerated.    Dispo:  -TSH today -US soft tissue head/neck in 1-2 weeks, will call with results -See Dr. Nicolette Bang (ENT) in 11/2021 -Return to cancer center to see Survivorship NP in 05/2022.  -Return to cancer center to see Dr. Isidore Moos as needed -Routine f/up with PCP, neuro, psych, and others as scheduled    Questions were answered. He knows to call with new/worsening concerns or questions.  A total of 40 minutes was spent in the face-to-face care of this patient, with greater than 50% of that time spent in counseling and care-coordination.    Cira Rue, NP [place] [phone]

## 2021-08-12 ENCOUNTER — Encounter: Payer: Self-pay | Admitting: Neurology

## 2021-08-12 ENCOUNTER — Telehealth: Payer: Self-pay | Admitting: Nurse Practitioner

## 2021-08-12 ENCOUNTER — Ambulatory Visit (INDEPENDENT_AMBULATORY_CARE_PROVIDER_SITE_OTHER): Payer: Medicare Other | Admitting: Neurology

## 2021-08-12 VITALS — BP 123/76 | HR 85 | Ht 67.0 in | Wt 148.0 lb

## 2021-08-12 DIAGNOSIS — G250 Essential tremor: Secondary | ICD-10-CM | POA: Diagnosis not present

## 2021-08-12 DIAGNOSIS — F0280 Dementia in other diseases classified elsewhere without behavioral disturbance: Secondary | ICD-10-CM | POA: Diagnosis not present

## 2021-08-12 DIAGNOSIS — G301 Alzheimer's disease with late onset: Secondary | ICD-10-CM

## 2021-08-12 MED ORDER — MEMANTINE HCL 10 MG PO TABS: 10.0000 mg | ORAL_TABLET | Freq: Two times a day (BID) | ORAL | 1 refills | Status: DC

## 2021-08-12 MED ORDER — MEMANTINE HCL 28 X 5 MG & 21 X 10 MG PO TABS: ORAL_TABLET | ORAL | 0 refills | Status: DC

## 2021-08-12 NOTE — Patient Instructions (Signed)
Alzheimer's Disease Alzheimer's disease is a brain disease that affects memory, thinking, language, and behavior. People with Alzheimer's disease lose mental abilities, and the disease gets worse over time. Alzheimer's disease is a form of dementia. What are the causes? This condition develops when a protein called beta-amyloid forms deposits in the brain. It is not known what causes these deposits to form. Alzheimer's disease may also be caused by a gene mutation that is inherited from one parent or both parents. A gene mutation is a harmful change in a gene. Not everyone who inherits the genetic mutation will get the disease. What increases the risk? You are more likely to develop this condition if you: Are older than age 51. Are male. Have any of these medical conditions: High blood pressure. Diabetes. Heart or blood vessel disease. Smoke. Have obesity. Have had a brain injury. Have had a stroke. Have a family history of dementia. What are the signs or symptoms? Symptoms of this condition may happen in three stages, which often overlap. Early stage In this stage, you may continue to be independent. You may still be able to drive, work, and be social. Symptoms in this stage include: Minor memory problems, such as forgetting a name, words, or what you did recently. Difficulty with: Paying attention. Communicating. Doing familiar tasks. Problem solving or doing calculations. Following instructions. Learning new things. Anxiety. Social withdrawal. Loss of motivation. Moderate stage In this stage, you will start to need care. Symptoms in this stage include: Difficulty with expressing thoughts. Memory loss that affects daily life. This can include forgetting: Recent events that have happened. If you have taken medicines or eaten. Familiar places. You may get lost while walking or driving. To pay bills or manage finances. Personal hygiene such as bathing or using the  bathroom. Confusion about where you are or what time it is. Difficulty in judging distance. Changes in personality, mood, and behavior. You may be moody, irritable, angry, frustrated, fearful, anxious, or suspicious. Poor reasoning and judgment. Delusions or hallucinations. Changes in sleep patterns. Severe stage In the final stage, you will need help with your personal care and daily activities. Symptoms in this stage include: Worsening memory loss. Personality changes. Loss of awareness of your surroundings. Changes in physical abilities, including the ability to walk, sit, and swallow. Difficulty in communicating. Inability to control your bladder and bowels. Increasing confusion. Increasing behavior changes. How is this diagnosed? This condition is diagnosed by a health care provider who specializes in diseases of the nervous system (neurologist) or one who specializes in care of the elderly (geriatrician or geriatric psychiatrist). Other causes of dementia may also be ruled out. Your health care provider will talk with you and your family, friends, or caregivers about your history and symptoms. A thorough medical history will be taken, and you will have a physical exam and tests. Tests may include: Lab tests, such as blood or urine tests. Imaging tests, such as a CT scan, a PET scan, or an MRI. A lumbar puncture. This test involves removing and testing a small amount of the fluid that surrounds the brain and spinal cord. An electroencephalogram (EEG). In this test, small metal discs are used to measure electrical activity in the brain. Memory tests, cognitive tests, and neuropsychological tests. These tests evaluate brain function. Genetic testing. This may be done if you have early onset of the disease (before age 54) or if other family members have the disease. How is this treated? At this time, there is no  treatment to cure Alzheimer's disease or stop it from getting worse. The  goals of treatment are: To manage behavioral changes. To provide you with a safe environment. To help manage daily life for you and your caregivers. The following treatment options are available: Medicines. Medicines may help the memory work better and manage behavioral symptoms. Cognitive therapy. Cognitive therapy provides you with education, support, and memory aids. It is most helpful in the early stages of the condition. Counseling or spiritual guidance. It is normal to have a lot of feelings, including anger, relief, fear, and isolation. Counseling and guidance can help you deal with these feelings. Caregiving. This involves having caregivers help you with your daily activities. Family support groups. These provide education, emotional support, and information about community resources to family members who are taking care of you. Follow these instructions at home: Medicines Take over-the-counter and prescription medicines only as told by your health care provider. Use a pill organizer or pill reminder to help you manage your medicines. Avoid taking medicines that can affect thinking, such as pain medicines or sleeping medicines. Lifestyle Make healthy lifestyle choices: Be physically active as told by your health care provider. Regular exercise may help improve symptoms. Do not use any products that contain nicotine or tobacco, such as cigarettes, e-cigarettes, and chewing tobacco. If you need help quitting, ask your health care provider. Do not drink alcohol. Eat a healthy diet. Practice stress-management techniques when you get stressed. Stay social. Drink enough fluid to keep your urine pale yellow. Make sure to get quality sleep. Avoid taking long naps during the day. Take short naps of 30 minutes or less if needed. Keep your sleeping area dark and cool. Avoid exercising during the few hours before you go to bed. Avoid caffeine products in the afternoon and evening. General  instructions Work with your health care provider to determine what you need help with and what your safety needs are. If you were given a bracelet that identifies you as a person with memory loss or tracks your location, make sure to wear it at all times. Talk with your health care provider about whether it is safe for you to drive. Work with your family to make important decisions, such as advance directives, medical power of attorney, or a living will. Keep all follow-up visits. This is important. Where to find more information The Alzheimer's Association: Call the 24-hour helpline at 1-336-675-2279, or visit [employer] Contact a health care provider if: You have nausea, vomiting, or trouble with eating related to a medicine. You have worsening mood or behavior changes, such as depression, anxiety, or hallucinations. You or your family members become concerned for your safety. Get help right away if: You become less responsive or are difficult to wake up. Your memory suddenly gets worse. You feel that you want to harm yourself. If you ever feel like you may hurt yourself or others, or have thoughts about taking your own life, get help right away. Go to your nearest emergency department or: Call your local emergency services (911 in the [place] Call a suicide crisis helpline, such as the [place] at 6717547682 or 988 in the [place]. This is open 24 hours a day in the [place] Text the Crisis Text Line at 434-250-7359 (in the [place].). Summary Alzheimer's disease is a brain disease that affects memory, thinking, language, and behavior. Alzheimer's disease is a form of dementia. This condition is diagnosed by a specialist in diseases of the nervous system (neurologist)  or one who specializes in care of the elderly. At this time, there is no treatment to cure Alzheimer's disease or stop it from getting worse. The goal of treatment is to help you manage any symptoms. Work with  your family to make important decisions, such as advance directives, medical power of attorney, or a living will. This information is not intended to replace advice given to you by your health care provider. Make sure you discuss any questions you have with your health care provider. Document Revised: 02/16/2021 Document Reviewed: 11/10/2019 Elsevier Patient Education  2022 [employer].

## 2021-08-12 NOTE — Telephone Encounter (Signed)
Scheduled follow-up appointment per 1/5 los. Patient's wife is aware.

## 2021-08-18 ENCOUNTER — Ambulatory Visit (HOSPITAL_COMMUNITY)
Admission: RE | Admit: 2021-08-18 | Discharge: 2021-08-18 | Disposition: A | Discharge status: Home / Self Care | Payer: Medicare Other | Source: Ambulatory Visit | Attending: Nurse Practitioner | Admitting: Nurse Practitioner

## 2021-08-18 ENCOUNTER — Other Ambulatory Visit: Payer: Self-pay

## 2021-08-18 DIAGNOSIS — C01 Malignant neoplasm of base of tongue: Secondary | ICD-10-CM | POA: Diagnosis present

## 2021-08-18 DIAGNOSIS — M542 Cervicalgia: Secondary | ICD-10-CM | POA: Insufficient documentation

## 2021-08-21 ENCOUNTER — Telehealth: Payer: Self-pay | Admitting: Nurse Practitioner

## 2021-08-21 NOTE — Telephone Encounter (Signed)
I called patient and his spouse to review soft tissue neck US and discuss next steps. No answer, left VM to call back.   Cira Rue, NP

## 2021-08-22 ENCOUNTER — Telehealth: Payer: Self-pay | Admitting: Nurse Practitioner

## 2021-08-22 DIAGNOSIS — M542 Cervicalgia: Secondary | ICD-10-CM

## 2021-08-22 DIAGNOSIS — C01 Malignant neoplasm of base of tongue: Secondary | ICD-10-CM

## 2021-08-22 NOTE — Telephone Encounter (Signed)
Spoke to Mrs. Karlene Lineman re: [place] results which did not detect an abnormality in the submandibular area at the location of concern. She reports patient still feels something there, but shrinking. She would like pt to proceed with further work up.   In the context of his H/N cancer, will proceed with CT soft tissue Neck w contrast. We will arrange and I will call pt/spouse with results. She agrees and appreciates the call.   Cira Rue, NP

## 2021-08-23 ENCOUNTER — Telehealth: Payer: Self-pay

## 2021-08-23 NOTE — Telephone Encounter (Signed)
Pt and pt's spouse phyllis was given the number to schedule CT neck appt at [phone] and was told to schedule an appt that is within this next week. They were also instructed to give this LPN a call back at [phone] for appt information. Pt and spouse verbalized understanding.

## 2021-08-23 NOTE — Telephone Encounter (Signed)
Spoke with pt's wife phyllis, CT neck w/ contrast has been scheduled for 08/30/2021 at 4:30pm at [facility]. This LPN has informed her they will need to come to the [facility] and pick up contrast as soon as possible. Pt's wife [name] verbalized understanding.

## 2021-08-29 ENCOUNTER — Encounter: Payer: Self-pay | Admitting: Nurse Practitioner

## 2021-08-30 ENCOUNTER — Ambulatory Visit (HOSPITAL_COMMUNITY)
Admission: RE | Admit: 2021-08-30 | Discharge: 2021-08-30 | Disposition: A | Discharge status: Home / Self Care | Payer: Medicare Other | Source: Ambulatory Visit | Attending: Nurse Practitioner | Admitting: Nurse Practitioner

## 2021-08-30 ENCOUNTER — Other Ambulatory Visit: Payer: Self-pay

## 2021-08-30 DIAGNOSIS — M542 Cervicalgia: Secondary | ICD-10-CM | POA: Insufficient documentation

## 2021-08-30 DIAGNOSIS — C01 Malignant neoplasm of base of tongue: Secondary | ICD-10-CM | POA: Insufficient documentation

## 2021-08-30 LAB — POCT I-STAT CREATININE: Creatinine, Ser: 0.9 mg/dL (ref 0.61–1.24)

## 2021-08-30 MED ORDER — IOHEXOL 300 MG/ML  SOLN
100.0000 mL | Freq: Once | INTRAMUSCULAR | Status: AC | PRN
  Administered 2021-08-30: 17:00:00 100 mL via INTRAVENOUS

## 2021-09-06 ENCOUNTER — Telehealth: Payer: Self-pay | Admitting: Nurse Practitioner

## 2021-09-06 NOTE — Telephone Encounter (Signed)
Called pt, spoke to his wife and reviewed CT results. Negative for recurrence. I feel that it's ok to monitor him clinically for now, she agrees. Knows to reach out with any new/worsening concerns. Next f/up with ENT, then see me back in the Fall, or sooner if needed.   She agrees with the plan and appreciates the call.   Cira Rue, NP

## 2021-09-10 ENCOUNTER — Other Ambulatory Visit: Payer: Self-pay | Admitting: Neurology

## 2021-09-21 ENCOUNTER — Other Ambulatory Visit: Payer: Self-pay | Admitting: Neurology

## 2021-09-22 ENCOUNTER — Other Ambulatory Visit: Payer: Self-pay

## 2021-09-23 ENCOUNTER — Ambulatory Visit: Payer: Medicare Other | Admitting: Neurology

## 2021-10-19 ENCOUNTER — Telehealth: Payer: Self-pay | Admitting: Physician Assistant

## 2021-10-19 NOTE — Telephone Encounter (Signed)
Called patient wife she is going to discontinue the medication and she is also going to reach out to the PCP or urologist for UTI screening for the patient  ?

## 2021-10-19 NOTE — Telephone Encounter (Signed)
Patients wife called and stated that the new medicine memantine that he is taking is causing very bad side effects and delusions. ?

## 2021-10-20 ENCOUNTER — Telehealth: Payer: Self-pay | Admitting: Physician Assistant

## 2021-10-20 DIAGNOSIS — R4182 Altered mental status, unspecified: Secondary | ICD-10-CM

## 2021-10-20 DIAGNOSIS — F22 Delusional disorders: Secondary | ICD-10-CM

## 2021-10-20 LAB — PSA: PSA: 1.96

## 2021-10-20 NOTE — Telephone Encounter (Signed)
Pt's wife called in stating the pt went to his Urologist today and he does not have a UTI. She would like a call back with some direction. ?

## 2021-10-21 ENCOUNTER — Telehealth (HOSPITAL_COMMUNITY): Payer: Self-pay | Admitting: *Deleted

## 2021-10-21 NOTE — Telephone Encounter (Signed)
Called wife she is agreeable to putting In the CT scan for patient. Order has been put In and sending to [facility]  ?

## 2021-10-21 NOTE — Telephone Encounter (Signed)
Pt's wife, [name], called seeking advice from Dr. Casimiro Needle regarding recent increasingly "odd behaviors". Spouse describes Sundowning type behaviors such as increased confusion, agitation, anxiety, and even some physical aggression in the late afternoon and evening. Writer advised spouse that Dr. Casimiro Needle is unavailable currently and that she should f/u with Neurology as pt has dx of Alzheimer's. Mrs. Gallardo says pt is scheduled for CT scan and has been cleared for UTI by Urologist. Spouse verbalizes understanding and was encouraged to call with any further questions or concerns.  ?
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
## 2021-10-28 ENCOUNTER — Other Ambulatory Visit: Payer: Self-pay | Admitting: Family Medicine

## 2021-11-02 ENCOUNTER — Ambulatory Visit (INDEPENDENT_AMBULATORY_CARE_PROVIDER_SITE_OTHER): Payer: Medicare Other | Admitting: Family Medicine

## 2021-11-02 VITALS — BP 120/78 | HR 71 | Temp 98.1°F | Wt 143.8 lb

## 2021-11-02 DIAGNOSIS — F32A Depression, unspecified: Secondary | ICD-10-CM

## 2021-11-02 DIAGNOSIS — G309 Alzheimer's disease, unspecified: Secondary | ICD-10-CM | POA: Diagnosis not present

## 2021-11-02 DIAGNOSIS — E038 Other specified hypothyroidism: Secondary | ICD-10-CM

## 2021-11-02 DIAGNOSIS — Z8546 Personal history of malignant neoplasm of prostate: Secondary | ICD-10-CM

## 2021-11-02 DIAGNOSIS — M1990 Unspecified osteoarthritis, unspecified site: Secondary | ICD-10-CM

## 2021-11-02 DIAGNOSIS — C01 Malignant neoplasm of base of tongue: Secondary | ICD-10-CM

## 2021-11-02 DIAGNOSIS — E785 Hyperlipidemia, unspecified: Secondary | ICD-10-CM

## 2021-11-02 DIAGNOSIS — I10 Essential (primary) hypertension: Secondary | ICD-10-CM

## 2021-11-02 DIAGNOSIS — F02A0 Dementia in other diseases classified elsewhere, mild, without behavioral disturbance, psychotic disturbance, mood disturbance, and anxiety: Secondary | ICD-10-CM

## 2021-11-02 DIAGNOSIS — G250 Essential tremor: Secondary | ICD-10-CM

## 2021-11-02 NOTE — Progress Notes (Signed)
? ?  Subjective:  ? ? Patient ID: [name], male    DOB: [birth year], 76 y.o.   MRN: [id number] ? ?HPI ?He is here for consult concerning filling out [facility].  He and his wife plan to move there in the near future and need to get the paperwork done. ? ? ?Review of Systems ? ?   ?Objective:  ? Physical Exam ?Alert and in no distress otherwise not examined ? ? ? ?   ?Assessment & Plan:  ?History of prostate cancer ? ?Malignant neoplasm of base of tongue ([place]) ? ?Other specified hypothyroidism ? ?Mild Alzheimer's dementia, unspecified timing of dementia onset, unspecified whether behavioral, psychotic, or mood disturbance or anxiety ([place]) ? ?Hyperlipidemia, unspecified hyperlipidemia type ? ?Primary hypertension ? ?Arthritis ? ?Depression, unspecified depression type ? ?Essential tremor ?I filled out the paperwork with his primary diagnoses.  He does have an extensive list of diagnoses but most of them are status quo.  He continues to be followed by neurology, psychiatry as well as urology and ENT surgeons.  Right now things seem to be fairly stable. ? ?

## 2021-11-05 ENCOUNTER — Other Ambulatory Visit: Payer: Self-pay | Admitting: Family Medicine

## 2021-11-05 DIAGNOSIS — Z8546 Personal history of malignant neoplasm of prostate: Secondary | ICD-10-CM

## 2021-11-17 ENCOUNTER — Ambulatory Visit
Admission: RE | Admit: 2021-11-17 | Discharge: 2021-11-17 | Disposition: A | Discharge status: Home / Self Care | Payer: Medicare Other | Source: Ambulatory Visit | Attending: Neurology | Admitting: Neurology

## 2021-11-17 DIAGNOSIS — F22 Delusional disorders: Secondary | ICD-10-CM

## 2021-11-17 DIAGNOSIS — R4182 Altered mental status, unspecified: Secondary | ICD-10-CM

## 2021-11-18 NOTE — Telephone Encounter (Signed)
Called pateint with results from his CT scan and is was normal no blood clot or anything to indicate patients change in behavior. I did let patient know Dr. Carles Collet is out of the office until Tuesday for a plan moving forward. Patient has had no change since he has discontinued the memantine  medication and wife is concerned this maybe a Dr. Casimiro Needle situation? ?

## 2021-11-21 HISTORY — PX: OTHER SURGICAL HISTORY: SHX169

## 2021-11-21 NOTE — Telephone Encounter (Signed)
Called wife and she was frustrated and said its just her problem I apologized that we were not able to help  ?

## 2021-11-22 ENCOUNTER — Ambulatory Visit (HOSPITAL_BASED_OUTPATIENT_CLINIC_OR_DEPARTMENT_OTHER): Payer: Medicare Other | Admitting: Psychiatry

## 2021-11-22 DIAGNOSIS — F324 Major depressive disorder, single episode, in partial remission: Secondary | ICD-10-CM | POA: Diagnosis not present

## 2021-11-22 MED ORDER — HYDROXYZINE PAMOATE 25 MG PO CAPS: ORAL_CAPSULE | ORAL | 1 refills | Status: DC

## 2021-11-22 MED ORDER — DIVALPROEX SODIUM ER 250 MG PO TB24: ORAL_TABLET | ORAL | 3 refills | Status: DC

## 2021-11-22 MED ORDER — MIRTAZAPINE 30 MG PO TBDP: ORAL_TABLET | ORAL | 8 refills | Status: DC

## 2021-11-22 MED ORDER — VORTIOXETINE HBR 20 MG PO TABS: 20.0000 mg | ORAL_TABLET | Freq: Every day | ORAL | 1 refills | Status: DC
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
## 2021-11-23 ENCOUNTER — Ambulatory Visit (INDEPENDENT_AMBULATORY_CARE_PROVIDER_SITE_OTHER): Payer: Medicare Other | Admitting: Family Medicine

## 2021-11-23 ENCOUNTER — Encounter: Payer: Self-pay | Admitting: Family Medicine

## 2021-11-23 VITALS — BP 110/70 | HR 70 | Temp 98.4°F | Ht 67.0 in | Wt 139.4 lb

## 2021-11-23 DIAGNOSIS — E038 Other specified hypothyroidism: Secondary | ICD-10-CM | POA: Diagnosis not present

## 2021-11-23 DIAGNOSIS — C01 Malignant neoplasm of base of tongue: Secondary | ICD-10-CM

## 2021-11-23 DIAGNOSIS — E785 Hyperlipidemia, unspecified: Secondary | ICD-10-CM

## 2021-11-23 DIAGNOSIS — F02A0 Dementia in other diseases classified elsewhere, mild, without behavioral disturbance, psychotic disturbance, mood disturbance, and anxiety: Secondary | ICD-10-CM

## 2021-11-23 DIAGNOSIS — F32A Depression, unspecified: Secondary | ICD-10-CM

## 2021-11-23 DIAGNOSIS — Z0000 Encounter for general adult medical examination without abnormal findings: Secondary | ICD-10-CM

## 2021-11-23 DIAGNOSIS — G4733 Obstructive sleep apnea (adult) (pediatric): Secondary | ICD-10-CM

## 2021-11-23 DIAGNOSIS — G250 Essential tremor: Secondary | ICD-10-CM

## 2021-11-23 DIAGNOSIS — G309 Alzheimer's disease, unspecified: Secondary | ICD-10-CM

## 2021-11-23 DIAGNOSIS — K219 Gastro-esophageal reflux disease without esophagitis: Secondary | ICD-10-CM

## 2021-11-23 DIAGNOSIS — M1990 Unspecified osteoarthritis, unspecified site: Secondary | ICD-10-CM

## 2021-11-23 DIAGNOSIS — I700 Atherosclerosis of aorta: Secondary | ICD-10-CM

## 2021-11-23 DIAGNOSIS — I10 Essential (primary) hypertension: Secondary | ICD-10-CM

## 2021-11-23 DIAGNOSIS — Z8546 Personal history of malignant neoplasm of prostate: Secondary | ICD-10-CM | POA: Diagnosis not present

## 2021-11-23 NOTE — Progress Notes (Signed)
[name] is a 76 y.o. male who presents for annual wellness visit,CPE and follow-up on chronic medical conditions.  He recently seen by Dr. Casimiro Needle and is now taking Depakote.  He will continue on his other medications.  He and his wife have applied to move to friends homes and apparently that is pending.  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  He is followed by neurology for his underlying Alzheimer disease as well as tremor.  This seems to be fairly stable.   He continues on finasteride for his BPH and is having no difficulty with that.  He does have OSA however its not causing any difficulty.  His reflux seems to be under good control. ? ? ?Immunizations and Health Maintenance ?Immunization History  ?Administered Date(s) Administered  ? Influenza Split 04/18/2012, 05/06/2013, 05/27/2015  ? Influenza Whole 04/12/2011  ? Influenza, High Dose Seasonal PF 05/21/2017, 05/16/2018, 04/11/2019, 05/09/2020  ? Influenza-Unspecified 04/23/2014, 05/18/2016, 05/21/2017, 05/16/2018, 05/09/2020, 04/19/2021  ? PFIZER Comirnaty(Gray Top)Covid-19 Tri-Sucrose Vaccine 11/16/2020  ? PFIZER(Purple Top)SARS-COV-2 Vaccination 09/19/2019, 10/03/2019, 04/06/2020  ? Pneumococcal Conjugate-13 08/31/2015  ? Pneumococcal Polysaccharide-23 12/09/2009  ? Tdap 01/17/2000, 07/25/2012  ? Zoster Recombinat (Shingrix) 05/04/2021, 11/01/2021  ? Zoster, Live 12/08/2008  ? ?There are no preventive care reminders to display for this patient. ? ?Last colonoscopy: 08/17/2006 cologuard  11/12/19 ?Last PSA: 10/20/21 ( 1.96) ?Dentist: Q three months  ?Ophtho: Q year ?Exercise: N/A  ? ?Other doctors caring for patient include: Dr. Alinda Money urology ?          Dr. Carles Collet neurology   ?          [place], NP oncology ? ?Advanced Directives: ?Does Patient Have a Medical Advance Directive?: Yes ?Type of Advance Directive: Healthcare Power of [place], Living will ?Copy of  [place] in Chart?: No - copy requested ? ?Depression screen:  See questionnaire below.   ?  ? ?  11/23/2021  ?  2:25 PM 11/02/2021  ?  2:21 PM 11/16/2020  ?  2:48 PM 11/16/2020  ?  2:42 PM 08/21/2019  ?  1:45 PM  ?Depression screen PHQ 2/9  ?Decreased Interest 3 0 0 0 1  ?Down, Depressed, Hopeless 0 0 0 0 3  ?PHQ - 2 Score 3 0 0 0 4  ?Altered sleeping 0 0 0  0  ?Tired, decreased energy '1 1 1  '$ 0  ?Change in appetite 3 3 0  1  ?Feeling bad or failure about yourself  0 0 0  0  ?Trouble concentrating '3 3 2  3  '$ ?Moving slowly or fidgety/restless 0 0 0  0  ?Suicidal thoughts 0 0 0  0  ?PHQ-9 Score '10 7 3  8  '$ ?Difficult doing work/chores Somewhat difficult Not difficult at all Somewhat difficult  Very difficult  ? ? ?Fall Screen: See Questionaire below. ?  ? ?  11/23/2021  ?  2:25 PM 11/02/2021  ?  2:24 PM 08/12/2021  ?  2:55 PM 11/16/2020  ?  2:41 PM 09/21/2020  ?  2:22 PM  ?Fall Risk   ?Falls in the past year? 0 0 0 0 0  ?Number falls in past yr: 0 0 0 0   ?Injury with Fall? 0 0 0 0 0  ?Risk for fall due to : No Fall Risks No Fall Risks  No Fall Risks   ?Follow up Falls evaluation  completed Falls evaluation completed  Falls evaluation completed   ? ? ?ADL screen:  See questionnaire below.  ?Functional Status Survey: ?Is the patient deaf or have difficulty hearing?: No ?Does the patient have difficulty seeing, even when wearing glasses/contacts?: No ?Does the patient have difficulty concentrating, remembering, or making decisions?: Yes ?Does the patient have difficulty walking or climbing stairs?: Yes ?Does the patient have difficulty dressing or bathing?: No ?Does the patient have difficulty doing errands alone such as visiting a doctor's office or shopping?: No ? ? ?Review of Systems ? ?Constitutional: -, -unexpected weight change, -anorexia, -fatigue ?Allergy: -sneezing, -itching, -congestion ?Dermatology: denies changing moles, rash, lumps ?ENT: -runny nose, -ear pain, -sore throat,  ?Cardiology:  -chest  pain, -palpitations, -orthopnea, ?Respiratory: -cough, -shortness of breath, -dyspnea on exertion, -wheezing,  ?Gastroenterology: -abdominal pain, -nausea, -vomiting, -diarrhea, -constipation, -dysphagia ?Hematology: -bleeding or bruising problems ?Musculoskeletal: -arthralgias, -myalgias, -joint swelling, -back pain, - ?Ophthalmology: -vision changes,  ?Urology: -dysuria, -difficulty urinating,  -urinary frequency, -urgency, incontinence ?Neurology: -, -numbness, , -memory loss, -falls, -dizziness ? ? ? ?PHYSICAL EXAM: ? ?General Appearance: Alert, cooperative, no distress, appears stated age ?Head: Normocephalic, without obvious abnormality, atraumatic ?Eyes: PERRL, conjunctiva/corneas clear, EOM's intact, ?Ears: Normal TM's and external ear canals ?Nose: Nares normal, mucosa normal, no drainage or sinus   tenderness ?Throat: Lips, mucosa, and tongue normal; teeth and gums normal ?Neck: Supple, no lymphadenopathy, thyroid:no enlargement/tenderness/nodules; no carotid bruit or JVD ?Lungs: Clear to auscultation bilaterally without wheezes, rales or ronchi; respirations unlabored ?Heart: Regular rate and rhythm, S1 and S2 normal, no murmur, rub or gallop ?Abdomen: Soft, non-tender, nondistended, normoactive bowel sounds, no masses, no hepatosplenomegaly ?Extremities: No clubbing, cyanosis or edema ?Pulses: 2+ and symmetric all extremities ?Skin: Skin color, texture, turgor normal, no rashes or lesions ?Lymph nodes: Cervical, supraclavicular, and axillary nodes normal ?Neurologic: CNII-XII intact, normal strength, sensation and gait; reflexes 2+ and symmetric throughout   ?Psych: Normal mood, affect, hygiene and grooming ? ?ASSESSMENT/PLAN: ?Routine general medical examination at a health care facility - Plan: CBC with Differential/Platelet, Comprehensive metabolic panel, Lipid panel ? ?History of prostate cancer ? ?Malignant neoplasm of base of tongue ([place]) ? ?Other specified hypothyroidism - Plan: TSH ? ?Mild  Alzheimer's dementia, unspecified timing of dementia onset, unspecified whether behavioral, psychotic, or mood disturbance or anxiety ([place]) ? ?Hyperlipidemia, unspecified hyperlipidemia type ? ?Primary hypertension ? ?Essential tremor ? ?Depression, unspecified depression type ? ?Arthritis ? ?OSA (obstructive sleep apnea) ? ?Aortic atherosclerosis ([place]) ? ?Gastroesophageal reflux disease without esophagitis ?He will continue to be followed by his various specialists and I will renew ? ? ?Immunization recommendations discussed. TDaP at the drug tore Colonoscopy recommendations reviewed. ? ? ?Medicare Attestation ?I have personally reviewed: ?The patient's medical and social history ?Their use of alcohol, tobacco or illicit drugs ?Their current medications and supplements ?The patient's functional ability including ADLs,fall risks, home safety risks, cognitive, and hearing and visual impairment ?Diet and physical activities ?Evidence for depression or mood disorders ? ?The patient's weight, height, and BMI have been recorded in the chart.  I have made referrals, counseling, and provided education to the patient based on review of the above and I have provided the patient with a written personalized care plan for preventive services.   ? ? ?Jill Alexanders, MD   11/23/2021  ? ? ?  ?

## 2021-11-24 LAB — LIPID PANEL
Chol/HDL Ratio: 2.2 ratio (ref 0.0–5.0)
Cholesterol, Total: 110 mg/dL (ref 100–199)
HDL: 49 mg/dL (ref >39)
LDL Chol Calc (NIH): 43 mg/dL (ref 0–99)
Triglycerides: 93 mg/dL (ref 0–149)
VLDL Cholesterol Cal: 18 mg/dL (ref 5–40)

## 2021-11-24 LAB — COMPREHENSIVE METABOLIC PANEL
ALT: 21 IU/L (ref 0–44)
AST: 20 IU/L (ref 0–40)
Albumin/Globulin Ratio: 2.6 — ABNORMAL HIGH (ref 1.2–2.2)
Albumin: 4.6 g/dL (ref 3.7–4.7)
Alkaline Phosphatase: 68 IU/L (ref 44–121)
BUN/Creatinine Ratio: 17 (ref 10–24)
BUN: 15 mg/dL (ref 8–27)
Bilirubin Total: 0.4 mg/dL (ref 0.0–1.2)
CO2: 25 mmol/L (ref 20–29)
Calcium: 9.5 mg/dL (ref 8.6–10.2)
Chloride: 103 mmol/L (ref 96–106)
Creatinine, Ser: 0.89 mg/dL (ref 0.76–1.27)
Globulin, Total: 1.8 g/dL (ref 1.5–4.5)
Glucose: 97 mg/dL (ref 70–99)
Potassium: 4.4 mmol/L (ref 3.5–5.2)
Sodium: 140 mmol/L (ref 134–144)
Total Protein: 6.4 g/dL (ref 6.0–8.5)
eGFR: 90 mL/min/{1.73_m2} (ref >59)

## 2021-11-24 LAB — CBC WITH DIFFERENTIAL/PLATELET
Basophils Absolute: 0 10*3/uL (ref 0.0–0.2)
Basos: 1 %
EOS (ABSOLUTE): 0.1 10*3/uL (ref 0.0–0.4)
Eos: 3 %
Hematocrit: 40.3 % (ref 37.5–51.0)
Hemoglobin: 13.5 g/dL (ref 13.0–17.7)
Immature Grans (Abs): 0 10*3/uL (ref 0.0–0.1)
Immature Granulocytes: 0 %
Lymphocytes Absolute: 0.6 10*3/uL — ABNORMAL LOW (ref 0.7–3.1)
Lymphs: 19 %
MCH: 30.8 pg (ref 26.6–33.0)
MCHC: 33.5 g/dL (ref 31.5–35.7)
MCV: 92 fL (ref 79–97)
Monocytes Absolute: 0.3 10*3/uL (ref 0.1–0.9)
Monocytes: 11 %
Neutrophils Absolute: 2.1 10*3/uL (ref 1.4–7.0)
Neutrophils: 66 %
Platelets: 176 10*3/uL (ref 150–450)
RBC: 4.38 x10E6/uL (ref 4.14–5.80)
RDW: 13 % (ref 11.6–15.4)
WBC: 3.1 10*3/uL — ABNORMAL LOW (ref 3.4–10.8)

## 2021-11-24 LAB — TSH: TSH: 3.11 u[IU]/mL (ref 0.450–4.500)

## 2021-12-15 ENCOUNTER — Encounter: Payer: Self-pay | Admitting: Family Medicine

## 2021-12-20 ENCOUNTER — Ambulatory Visit (HOSPITAL_COMMUNITY): Payer: Medicare Other | Admitting: Psychiatry

## 2021-12-24 ENCOUNTER — Other Ambulatory Visit: Payer: Self-pay | Admitting: Family Medicine

## 2021-12-24 DIAGNOSIS — E785 Hyperlipidemia, unspecified: Secondary | ICD-10-CM

## 2021-12-24 DIAGNOSIS — I700 Atherosclerosis of aorta: Secondary | ICD-10-CM

## 2021-12-27 ENCOUNTER — Ambulatory Visit (HOSPITAL_COMMUNITY): Payer: Medicare Other | Admitting: Psychiatry

## 2022-01-05 ENCOUNTER — Telehealth (HOSPITAL_COMMUNITY): Payer: Self-pay

## 2022-01-05 DIAGNOSIS — F324 Major depressive disorder, single episode, in partial remission: Secondary | ICD-10-CM

## 2022-01-05 MED ORDER — DIVALPROEX SODIUM 125 MG PO CSDR: 375.0000 mg | DELAYED_RELEASE_CAPSULE | Freq: Two times a day (BID) | ORAL | 1 refills | Status: DC

## 2022-01-05 NOTE — Telephone Encounter (Signed)
Medication management - Telephone call with pt's wife, [name], after speaking with Dr.Plovsky about pt's issues with being able to swallow Depakote ER 250 mg capsules.  Dr. Casimiro Needle changed pt to divalproex (Depakote Sprinkle) 125 mg 3 capsules twice a day.  Reviewed the medication with collateral as Dr. Casimiro Needle instructed she could sprinkle the medication on most any foods patient could then eat twice a day such as apple sauce.  Collateral stated understanding and e-scribed in the order as verbally provided to this nurse by Dr. Casimiro Needle this date for Divalproex (Depakote Sprinkle) 125 mg, three capsules 2 times a day, #180 with 1 refill.  Collateral stated understanding how to change patient to this form of depakote and agreed to keep appointment on 01/17/22 with Dr. Casimiro Needle for follow up.

## 2022-01-05 NOTE — Telephone Encounter (Signed)
Medication management - Telephone message left for pt's wife, [name] after she left one that pt has been having a hard time taking the Divalproex 250 mg tablets due to not being able to swallow them and questions if there is something else pt can take.  Informed a message would be sent to patient's provider, Dr. Casimiro Needle about the issue.

## 2022-01-09 HISTORY — PX: SKIN BIOPSY: SHX1

## 2022-01-14 ENCOUNTER — Other Ambulatory Visit: Payer: Self-pay | Admitting: Family Medicine

## 2022-01-14 DIAGNOSIS — I10 Essential (primary) hypertension: Secondary | ICD-10-CM

## 2022-01-17 ENCOUNTER — Ambulatory Visit (HOSPITAL_BASED_OUTPATIENT_CLINIC_OR_DEPARTMENT_OTHER): Payer: Medicare Other | Admitting: Psychiatry

## 2022-01-17 ENCOUNTER — Encounter (HOSPITAL_COMMUNITY): Payer: Self-pay | Admitting: Psychiatry

## 2022-01-17 VITALS — BP 126/83 | HR 59 | Temp 98.0°F | Ht 66.0 in | Wt 129.6 lb

## 2022-01-17 DIAGNOSIS — F324 Major depressive disorder, single episode, in partial remission: Secondary | ICD-10-CM

## 2022-01-17 DIAGNOSIS — Z79899 Other long term (current) drug therapy: Secondary | ICD-10-CM | POA: Diagnosis not present

## 2022-01-17 DIAGNOSIS — F329 Major depressive disorder, single episode, unspecified: Secondary | ICD-10-CM

## 2022-01-17 MED ORDER — HYDROXYZINE PAMOATE 25 MG PO CAPS: ORAL_CAPSULE | ORAL | 3 refills | Status: DC

## 2022-01-17 MED ORDER — DIVALPROEX SODIUM 125 MG PO CSDR: 375.0000 mg | DELAYED_RELEASE_CAPSULE | Freq: Two times a day (BID) | ORAL | 1 refills | Status: DC

## 2022-01-17 NOTE — Progress Notes (Signed)
.  Psychiatric Initial Adult Assessment   Patient Identification: [name] MRN:  [id number] Date of Evaluation:  01/17/2022 Referral Source: Dr. Wells Guiles Tat Chief Complaint:      Today this patient is seen with his wife.  He seems to be in no distress.  He seems pleasant and easygoing.  His wife however describes him as being aggressive irritable and even violent to things around him.  She says in some ways it seems he can control it because he never acts this way with strangers or other people.  She says she thinks she is the target.  I assured her she is not the target.  She sees him escalate throughout the day worsening around dinnertime and nighttime.  The Depakote prescription and deliverance has been changed somewhat.  He is now taking capsules 350 mg dose in the morning and 350 mg dose at night.  He is only been on this for about 2 weeks.  She reports has noted no changes.  We have asked her to go ahead and get a Depakote level in the next week.  My hope is to increase his Depakote as needed.  The patient actually is sleeping okay.  He is eating fair.  He has lost weight.  I recommended that they contact his primary care doctor about this weight loss.  However it should also be noted that they saw the primary care doctor just a few weeks ago and was not brought.  The patient is followed by a neurologist in her community.  The patient's behavior is characterized by kicking furniture punching things and being physically aggressive but fortunately he has not struck his wife.  On June 28 they plan to be moving to a friend's home and independent living.  I think this will be very difficult.  I think ultimately this patient and his wife are going to need an assistant that helps him with care.  Probably for companionship company.  My hope is that friend's home will be able to support this need and find them a companionship and have close nursing care.  The patient shows no overt evidence of psychosis.   He clearly denies being depressed. Visit Diagnosis: Major depression mild Depression Symptoms:  fatigue, (Hypo) Manic Symptoms:   Anxiety Symptoms:   Psychotic Symptoms:   PTSD Symptoms:   Past Psychiatric History: Trintellix, Wellbutrin  Previous Psychotropic Medications:   Substance Abuse History in the last 12 months:    Consequences of Substance Abuse:   Past Medical History:  Past Medical History:  Diagnosis Date   Allergy    Alzheimer disease ([place]) 06/2018   diagnosed with early stage alzheimers   Arthritis    osteoarthritis   Cancer ([place])    testicular, age 76, orchiectomy 1992   Cataract    B/L CATARACTS NO SURGERY   Dementia ([place])    Depression    Dyslipidemia    Heartburn    TAKES ZANTAC   History of radiation therapy 12/02/18- 01/14/19   Head and neck/ base of tongue. 60 Gy over 30 fractions.    Hypertension    Hypogonadism male    Prostate cancer ([place]) 06/07/11   gleason 3+3=6, vol 59.5 cc   Sleep apnea    Status post chemotherapy    testicular cancer 1992, Dr Beryle Beams    Past Surgical History:  Procedure Laterality Date   APPENDECTOMY     COLONOSCOPY  08/07/2005   [facility]   DIRECT LARYNGOSCOPY N/A 08/19/2018  Procedure: DIRECT LARYNGOSCOPY;  Surgeon: Leta Baptist, MD;  Location: [facility];  Service: ENT;  Laterality: N/A;   INGUINAL HERNIA REPAIR     w/orchiectomy 1992, retroperitoneal lymph node dissection   IR GASTROSTOMY TUBE REMOVAL  02/28/2019   MENISECTOMY     R&L knee surgeries   PROSTATE SURGERY     biopsy x 2   [facility]  06/13/2021   right inferior mid medial external ear antehelix shave   [id number] Left 11/21/2021   epidermoid cyst inflamed and disrupted   TONGUE BIOPSY N/A 08/19/2018   Procedure: BIOPSY OF TONGUE BASE MASS;  Surgeon: Leta Baptist, MD;  Location: [place];  Service: ENT;  Laterality: N/A;    Family Psychiatric History:   Family History:  Family History  Problem Relation Age of Onset    Lung cancer Mother    Hypertension Mother    Lung cancer Father    Heart failure Father    Hypertension Father     Social History:   Social History   Socioeconomic History   Marital status: Married    Spouse name: Not on file   Number of children: 0   Years of education: Not on file   Highest education level: Not on file  Occupational History   Occupation: RETIRED    Employer: [employer]  Tobacco Use   Smoking status: Former    Packs/day: 1.00    Years: 20.00    Total pack years: 20.00    Types: Cigarettes    Quit date: 08/03/1999    Years since quitting: 22.4   Smokeless tobacco: Never  Vaping Use   Vaping Use: Never used  Substance and Sexual Activity   Alcohol use: Not Currently   Drug use: No   Sexual activity: Yes  Other Topics Concern   Not on file  Social History Narrative   Not on file   Social Determinants of Health   Financial Resource Strain: Not on file  Food Insecurity: Not on file  Transportation Needs: No Transportation Needs (09/27/2018)   PRAPARE - Transportation    Lack of Transportation (Medical): No    Lack of Transportation (Non-Medical): No  Physical Activity: Not on file  Stress: Not on file  Social Connections: Not on file    Additional Social History:   Allergies:  No Known Allergies  Metabolic Disorder Labs: No results found for: "HGBA1C", "MPG" No results found for: "PROLACTIN" Lab Results  Component Value Date   CHOL 110 11/23/2021   TRIG 93 11/23/2021   HDL 49 11/23/2021   CHOLHDL 2.2 11/23/2021   VLDL 18 09/11/2016   LDLCALC 43 11/23/2021   LDLCALC 50 11/16/2020   Lab Results  Component Value Date   TSH 3.110 11/23/2021    Therapeutic Level Labs: No results found for: "LITHIUM" No results found for: "CBMZ" No results found for: "VALPROATE"  Current Medications: Current Outpatient Medications  Medication Sig Dispense Refill   carboxymethylcellulose (REFRESH PLUS) 0.5 % SOLN 1 drop 3 (three)  times daily as needed. One drop each eye 2-3 times daily     cholecalciferol (VITAMIN D3) 25 MCG (1000 UNIT) tablet Take 1,000 Units by mouth daily.     donepezil (ARICEPT) 10 MG tablet TAKE 1 TABLET(10 MG) BY MOUTH AT BEDTIME 90 tablet 1   famotidine (PEPCID) 10 MG tablet Take 10 mg by mouth daily.     finasteride (PROSCAR) 5 MG tablet TAKE 1 TABLET BY MOUTH EVERY DAY 90 tablet  1   ipratropium (ATROVENT) 0.06 % nasal spray USE 2 SPRAYS IEN BID PRN FOR DRAINAGE     levothyroxine (SYNTHROID) 75 MCG tablet TAKE 1 TABLET(75 MCG) BY MOUTH DAILY 90 tablet 0   mirtazapine (REMERON SOL-TAB) 30 MG disintegrating tablet DISSOLVE 1 TABLET(30 MG) ON THE TONGUE AT BEDTIME 30 tablet 8   polyethylene glycol powder (GLYCOLAX/MIRALAX) powder Take 17 g by mouth daily. 3350 g 11   primidone (MYSOLINE) 50 MG tablet TAKE 1 TABLET(50 MG) BY MOUTH IN THE MORNING AND AT BEDTIME 180 tablet 1   rosuvastatin (CRESTOR) 20 MG tablet TAKE 1 TABLET(20 MG) BY MOUTH DAILY 90 tablet 2   sodium fluoride (PREVIDENT 5000 PLUS) 1.1 % CREA dental cream Apply cream to tooth brush. Brush teeth for 2 minutes. Spit out excess. DO NOT rinse afterwards. Repeat nightly. 1 Tube prn   terazosin (HYTRIN) 2 MG capsule TAKE 1 CAPSULE(2 MG) BY MOUTH DAILY 90 capsule 1   vortioxetine HBr (TRINTELLIX) 20 MG TABS tablet Take 1 tablet (20 mg total) by mouth daily. 90 tablet 1   divalproex (DEPAKOTE SPRINKLE) 125 MG capsule Take 3 capsules (375 mg total) by mouth 2 (two) times daily. 180 capsule 1   hydrOXYzine (VISTARIL) 25 MG capsule 1 qam  1  qnoon  1  q diner  1 or 2  qday PRN for agitation 150 capsule 3   No current facility-administered medications for this visit.    Musculoskeletal: Strength & Muscle Tone: within normal limits Gait & Station: normal Patient leans: N/A  Psychiatric Specialty Exam: ROS  Blood pressure 126/83, pulse (!) 59, temperature 98 F (36.7 C), height '5\' 6"'$  (1.676 m), weight 129 lb 9.6 oz (58.8 kg), SpO2 98 %.Body  mass index is 20.92 kg/m.  General Appearance: Casual  Eye Contact:  Good  Speech:  Normal Rate  Volume:  Normal  Mood:  Negative  Affect:  Congruent  Thought Process:  Goal Directed  Orientation:  Full (Time, Place, and Person)  Thought Content:  Logical  Suicidal Thoughts:  No  Homicidal Thoughts:  No  Memory:  NA  Judgement:  Good  Insight:  Fair  Psychomotor Activity:  Normal  Concentration:    Recall:  [name] of Knowledge:Good  Language: Good  Akathisia:  No  Handed:  Right  AIMS (if indicated):  not done  Assets:  Desire for Improvement Financial Resources/Insurance  ADL's:  Intact  Cognition: Impaired,  Moderate  Sleep:     Screenings: Mini-Mental    Flowsheet Row Office Visit from 06/08/2016 in [place]  Total Score (max 30 points ) 28      PHQ2-9    [place] Visit from 11/23/2021 in [place] Visit from 11/02/2021 in [place] Visit from 11/16/2020 in [place] Visit from 08/21/2019 in [place] Visit from 09/18/2018 in [place]  PHQ-2 Total Score 3 0 0 4 6  PHQ-9 Total Score '10 7 3 8 19       '$ Assessment and Plan:   This patient is to diagnosis is.  The first is that of dementia.  Probably of Alzheimer's type.  He is being followed by Dr. Carles Collet.  The patient does take Aricept but when he was on memantine the according to the wife she thought he became more agitated.  The possibility of going back to this agent should be considered.  For now we are going to ask him  to get a Depakote level for agitation.  Presently he is on 350 mg twice daily.  Get a Depakote level in the next week.  We have also asked him to begin taking Vistaril 25 mg 3 times daily.  He has been taking it at night and sleeping pretty well.  His agitation seems to be unpredictable.  Therefore we will have a consistent consistent dose of Vistaril 3 times a day and give  the opportunity to take 1 or 2 extra when he is agitated.  When they returned next month my hope is that his agitation level drops off and also that the Vistaril as needed is beneficial.

## 2022-01-19 ENCOUNTER — Other Ambulatory Visit (HOSPITAL_COMMUNITY): Payer: Self-pay | Admitting: Psychiatry

## 2022-01-20 ENCOUNTER — Telehealth (HOSPITAL_COMMUNITY): Payer: Self-pay | Admitting: *Deleted

## 2022-01-20 LAB — CBC WITH DIFFERENTIAL/PLATELET
Basophils Absolute: 0 10*3/uL (ref 0.0–0.2)
Basos: 2 %
EOS (ABSOLUTE): 0.1 10*3/uL (ref 0.0–0.4)
Eos: 4 %
Hematocrit: 42.1 % (ref 37.5–51.0)
Hemoglobin: 15.1 g/dL (ref 13.0–17.7)
Immature Grans (Abs): 0 10*3/uL (ref 0.0–0.1)
Immature Granulocytes: 0 %
Lymphocytes Absolute: 0.6 10*3/uL — ABNORMAL LOW (ref 0.7–3.1)
Lymphs: 26 %
MCH: 32.5 pg (ref 26.6–33.0)
MCHC: 35.9 g/dL — ABNORMAL HIGH (ref 31.5–35.7)
MCV: 91 fL (ref 79–97)
Monocytes Absolute: 0.4 10*3/uL (ref 0.1–0.9)
Monocytes: 16 %
Neutrophils Absolute: 1.3 10*3/uL — ABNORMAL LOW (ref 1.4–7.0)
Neutrophils: 52 %
Platelets: 169 10*3/uL (ref 150–450)
RBC: 4.65 x10E6/uL (ref 4.14–5.80)
RDW: 13 % (ref 11.6–15.4)
WBC: 2.5 10*3/uL — CL (ref 3.4–10.8)

## 2022-01-20 LAB — COMPREHENSIVE METABOLIC PANEL
ALT: 36 IU/L (ref 0–44)
AST: 31 IU/L (ref 0–40)
Albumin/Globulin Ratio: 2.3 — ABNORMAL HIGH (ref 1.2–2.2)
Albumin: 4.4 g/dL (ref 3.7–4.7)
Alkaline Phosphatase: 54 IU/L (ref 44–121)
BUN/Creatinine Ratio: 13 (ref 10–24)
BUN: 12 mg/dL (ref 8–27)
Bilirubin Total: 0.4 mg/dL (ref 0.0–1.2)
CO2: 22 mmol/L (ref 20–29)
Calcium: 9.2 mg/dL (ref 8.6–10.2)
Chloride: 104 mmol/L (ref 96–106)
Creatinine, Ser: 0.9 mg/dL (ref 0.76–1.27)
Globulin, Total: 1.9 g/dL (ref 1.5–4.5)
Glucose: 87 mg/dL (ref 70–99)
Potassium: 4.3 mmol/L (ref 3.5–5.2)
Sodium: 143 mmol/L (ref 134–144)
Total Protein: 6.3 g/dL (ref 6.0–8.5)
eGFR: 90 mL/min/{1.73_m2} (ref >59)

## 2022-01-20 LAB — VALPROIC ACID LEVEL: Valproic Acid Lvl: 64 ug/mL (ref 50–100)

## 2022-01-20 LAB — SPECIMEN STATUS REPORT

## 2022-01-20 NOTE — Telephone Encounter (Signed)
Lab results received via fax from [facility]. Of note:  MCHC 35.9 G/DL  ANC 1.3   LYMPHS ( ABSOLUTE) 0.6   A/G RATIO 2.3  VALPROIC ACID LEVEL WNL @ 64.

## 2022-01-30 ENCOUNTER — Other Ambulatory Visit: Payer: Self-pay | Admitting: Medical

## 2022-02-08 ENCOUNTER — Other Ambulatory Visit (HOSPITAL_COMMUNITY): Payer: Self-pay | Admitting: Psychiatry

## 2022-02-08 MED ORDER — BREXPIPRAZOLE 2 MG PO TABS: 2.0000 mg | ORAL_TABLET | Freq: Every day | ORAL | 2 refills | Status: DC

## 2022-02-09 ENCOUNTER — Encounter: Payer: Self-pay | Admitting: Physician Assistant

## 2022-02-09 ENCOUNTER — Ambulatory Visit (INDEPENDENT_AMBULATORY_CARE_PROVIDER_SITE_OTHER): Payer: Medicare Other | Admitting: Physician Assistant

## 2022-02-09 VITALS — BP 120/78 | HR 81 | Resp 20 | Ht 67.0 in

## 2022-02-09 DIAGNOSIS — G250 Essential tremor: Secondary | ICD-10-CM

## 2022-02-09 DIAGNOSIS — G309 Alzheimer's disease, unspecified: Secondary | ICD-10-CM | POA: Diagnosis not present

## 2022-02-09 DIAGNOSIS — F02A0 Dementia in other diseases classified elsewhere, mild, without behavioral disturbance, psychotic disturbance, mood disturbance, and anxiety: Secondary | ICD-10-CM

## 2022-02-09 NOTE — Patient Instructions (Addendum)
It was a pleasure to see you today at our office.   Recommendations:  Follow up in  6 months Continue donepezil 10 mg daily. Side effects were discussed  Increase primidone 50 mg to 3 times a day  Folllow up in 6 months  Contact our Education officer, museum for caregiver distress.    Whom to call:  Memory  decline, memory medications: Call our office [phone]   For psychiatric meds, mood meds: Please have your primary care physician manage these medications.   Counseling regarding caregiver distress, including caregiver depression, anxiety and issues regarding community resources, adult day care programs, adult living facilities, or memory care questions:   Feel free to contact [place], Social Worker at [phone]   For assessment of decision of mental capacity and competency:  Call Dr. Anthoney Harada, geriatric psychiatrist at [phone]  For guidance in geriatric dementia issues please call [facility] [phone]  For guidance regarding [facility] and if placement were needed at the facility, contact Arnell Asal, Social Worker tel: [phone]  If you have any severe symptoms of a stroke, or other severe issues such as confusion,severe chills or fever, etc call 911 or go to the ER as you may need to be evaluated further   Feel free to visit Facebook page " [email or web address]" for tips of how to care for people with memory problems.        RECOMMENDATIONS FOR ALL PATIENTS WITH MEMORY PROBLEMS: 1. Continue to exercise (Recommend 30 minutes of walking everyday, or 3 hours every week) 2. Increase social interactions - continue going to [place] and enjoy social gatherings with friends and family 3. Eat healthy, avoid fried foods and eat more fruits and vegetables 4. Maintain adequate blood pressure, blood sugar, and blood cholesterol level. Reducing the risk of stroke and cardiovascular disease also helps promoting better memory. 5. Avoid  stressful situations. Live a simple life and avoid aggravations. Organize your time and prepare for the next day in anticipation. 6. Sleep well, avoid any interruptions of sleep and avoid any distractions in the bedroom that may interfere with adequate sleep quality 7. Avoid sugar, avoid sweets as there is a strong link between excessive sugar intake, diabetes, and cognitive impairment We discussed the Mediterranean diet, which has been shown to help patients reduce the risk of progressive memory disorders and reduces cardiovascular risk. This includes eating fish, eat fruits and green leafy vegetables, nuts like almonds and hazelnuts, walnuts, and also use olive oil. Avoid fast foods and fried foods as much as possible. Avoid sweets and sugar as sugar use has been linked to worsening of memory function.  There is always a concern of gradual progression of memory problems. If this is the case, then we may need to adjust level of care according to patient needs. Support, both to the patient and caregiver, should then be put into place.    The Alzheimer's Association is here all day, every day for people facing Alzheimer's disease through our free 24/7 Helpline: 8193095689. The Helpline provides reliable information and support to all those who need assistance, such as individuals living with memory loss, Alzheimer's or other dementia, caregivers, health care professionals and the public.  Our highly trained and knowledgeable staff can help you with: Understanding memory loss, dementia and Alzheimer's  Medications and other treatment options  General information about aging and brain health  Skills to provide quality care and to find the best care from professionals  Legal, financial and  living-arrangement decisions Our Helpline also features: Confidential care consultation provided by master's level clinicians who can help with decision-making support, crisis assistance and education on issues  families face every day  Help in a caller's preferred language using our translation service that features more than 200 languages and dialects  Referrals to local community programs, services and ongoing support     FALL PRECAUTIONS: Be cautious when walking. Scan the area for obstacles that may increase the risk of trips and falls. When getting up in the mornings, sit up at the edge of the bed for a few minutes before getting out of bed. Consider elevating the bed at the head end to avoid drop of blood pressure when getting up. Walk always in a well-lit room (use night lights in the walls). Avoid area rugs or power cords from appliances in the middle of the walkways. Use a walker or a cane if necessary and consider physical therapy for balance exercise. Get your eyesight checked regularly.  FINANCIAL OVERSIGHT: Supervision, especially oversight when making financial decisions or transactions is also recommended.  HOME SAFETY: Consider the safety of the kitchen when operating appliances like stoves, microwave oven, and blender. Consider having supervision and share cooking responsibilities until no longer able to participate in those. Accidents with firearms and other hazards in the house should be identified and addressed as well.   ABILITY TO BE LEFT ALONE: If patient is unable to contact 911 operator, consider using LifeLine, or when the need is there, arrange for someone to stay with patients. Smoking is a fire hazard, consider supervision or cessation. Risk of wandering should be assessed by caregiver and if detected at any point, supervision and safe proof recommendations should be instituted.  MEDICATION SUPERVISION: Inability to self-administer medication needs to be constantly addressed. Implement a mechanism to ensure safe administration of the medications.   DRIVING: Regarding driving, in patients with progressive memory problems, driving will be impaired. We advise to have someone  else do the driving if trouble finding directions or if minor accidents are reported. Independent driving assessment is available to determine safety of driving.   If you are interested in the driving assessment, you can contact the following:  [employer] in [place]  [place] [phone] or [phone]      [place] refers to food and lifestyle choices that are based on the traditions of countries located on the [employer]. This way of eating has been shown to help prevent certain conditions and improve outcomes for people who have chronic diseases, like kidney disease and heart disease. What are tips for following this plan? Lifestyle  Cook and eat meals together with your family, when possible. Drink enough fluid to keep your urine clear or pale yellow. Be physically active every day. This includes: Aerobic exercise like running or swimming. Leisure activities like gardening, walking, or housework. Get 7-8 hours of sleep each night. If recommended by your health care provider, drink red wine in moderation. This means 1 glass a day for nonpregnant women and 2 glasses a day for men. A glass of wine equals 5 oz (150 mL). Reading food labels  Check the serving size of packaged foods. For foods such as rice and pasta, the serving size refers to the amount of cooked product, not dry. Check the total fat in packaged foods. Avoid foods that have saturated fat or trans fats. Check the  ingredients list for added sugars, such as corn syrup. Shopping  At the grocery store, buy most of your food from the areas near the walls of the store. This includes: Fresh fruits and vegetables (produce). Grains, beans, nuts, and seeds. Some of these may be available in unpackaged forms or large amounts (in bulk). Fresh seafood. Poultry and  eggs. Low-fat dairy products. Buy whole ingredients instead of prepackaged foods. Buy fresh fruits and vegetables in-season from local farmers markets. Buy frozen fruits and vegetables in resealable bags. If you do not have access to quality fresh seafood, buy precooked frozen shrimp or canned fish, such as tuna, salmon, or sardines. Buy small amounts of raw or cooked vegetables, salads, or olives from the deli or salad bar at your store. Stock your pantry so you always have certain foods on hand, such as olive oil, canned tuna, canned tomatoes, rice, pasta, and beans. Cooking  Cook foods with extra-virgin olive oil instead of using butter or other vegetable oils. Have meat as a side dish, and have vegetables or grains as your main dish. This means having meat in small portions or adding small amounts of meat to foods like pasta or stew. Use beans or vegetables instead of meat in common dishes like chili or lasagna. Experiment with different cooking methods. Try roasting or broiling vegetables instead of steaming or sauteing them. Add frozen vegetables to soups, stews, pasta, or rice. Add nuts or seeds for added healthy fat at each meal. You can add these to yogurt, salads, or vegetable dishes. Marinate fish or vegetables using olive oil, lemon juice, garlic, and fresh herbs. Meal planning  Plan to eat 1 vegetarian meal one day each week. Try to work up to 2 vegetarian meals, if possible. Eat seafood 2 or more times a week. Have healthy snacks readily available, such as: Vegetable sticks with hummus. Greek yogurt. Fruit and nut trail mix. Eat balanced meals throughout the week. This includes: Fruit: 2-3 servings a day Vegetables: 4-5 servings a day Low-fat dairy: 2 servings a day Fish, poultry, or lean meat: 1 serving a day Beans and legumes: 2 or more servings a week Nuts and seeds: 1-2 servings a day Whole grains: 6-8 servings a day Extra-virgin olive oil: 3-4 servings a day Limit  red meat and sweets to only a few servings a month What are my food choices? Mediterranean diet Recommended Grains: Whole-grain pasta. Brown rice. Bulgar wheat. Polenta. Couscous. Whole-wheat bread. [place]. Vegetables: Artichokes. Beets. Broccoli. Cabbage. Carrots. Eggplant. Green beans. Chard. Kale. Spinach. Onions. Leeks. Peas. Squash. Tomatoes. Peppers. Radishes. Fruits: Apples. Apricots. Avocado. Berries. Bananas. Cherries. Dates. Figs. Grapes. Lemons. Melon. Oranges. Peaches. Plums. Pomegranate. Meats and other protein foods: Beans. Almonds. Sunflower seeds. Pine nuts. Peanuts. [place]. Salmon. Scallops. Shrimp. [place]. Tilapia. Clams. Oysters. Eggs. Dairy: Low-fat milk. Cheese. Greek yogurt. Beverages: Water. Red wine. Herbal tea. Fats and oils: Extra virgin olive oil. Avocado oil. Grape seed oil. Sweets and desserts: [place] yogurt with honey. Baked apples. Poached pears. Trail mix. Seasoning and other foods: Basil. Cilantro. Coriander. Cumin. Mint. Parsley. Sage. Rosemary. Tarragon. Garlic. Oregano. Thyme. Pepper. Balsalmic vinegar. Tahini. Hummus. Tomato sauce. Olives. Mushrooms. Limit these Grains: Prepackaged pasta or rice dishes. Prepackaged cereal with added sugar. Vegetables: Deep fried potatoes (french fries). Fruits: Fruit canned in syrup. Meats and other protein foods: Beef. Pork. Lamb. Poultry with skin. Hot dogs. Berniece Salines. Dairy: Ice cream. Sour cream. Whole milk. Beverages: Juice. Sugar-sweetened soft drinks. Beer. Liquor and spirits. Fats and oils: Butter. Canola  oil. Vegetable oil. Beef fat (tallow). Lard. Sweets and desserts: Cookies. Cakes. Pies. Candy. Seasoning and other foods: Mayonnaise. Premade sauces and marinades. The items listed may not be a complete list. Talk with your dietitian about what dietary choices are right for you. Summary The Mediterranean diet includes both food and lifestyle choices. Eat a variety of fresh fruits and vegetables, beans, nuts,  seeds, and whole grains. Limit the amount of red meat and sweets that you eat. Talk with your health care provider about whether it is safe for you to drink red wine in moderation. This means 1 glass a day for nonpregnant women and 2 glasses a day for men. A glass of wine equals 5 oz (150 mL). This information is not intended to replace advice given to you by your health care provider. Make sure you discuss any questions you have with your health care provider. Document Released: 03/16/2016 Document Revised: 04/18/2016 Document Reviewed: 03/16/2016 Elsevier Interactive Patient Education  2017 [employer].

## 2022-02-09 NOTE — Progress Notes (Signed)
Assessment/Plan:   Dementia likely due to  [name] is a very pleasant 76 y.o. RH male seen today in follow up for memory loss. Patient is currently on donepezil 10 mg daily and memantine 10 bid, tolerating well. Memory without significant changes. Mood is managed by psychiatry. Tremors may be more evident throughout the afternoon, will increase the med frequency for comfort.    Recommendations:    Continue  donepezil 10 mg daily  and memantine 10 mg bid   Side effects were discussed Please set a phone call with Levonne Lapping, Social Worker, to discuss dementia counseling, including caregiver distress, community resources, etc  Discussed ALF vs HHN. For now, agree with [facility] at this time  Follow major depression and other mood issues with Dr. Lenice Pressman, Psychiatry Continue Primidone for ET 50 mg bid, after discussion with Dr. Delice Lesch, will increase to 50 g tid for better control of the tremors during the daytime Follow up in  6 months.   Case discussed with Dr. Delice Lesch who agrees with the plan     Subjective:        Patient is accompanied by his wife who supplements the history.  Previous records as well as any outside records available were reviewed prior to todays visit. Patient was last seen at our office on 08/12/21.   Any changes in memory since last visit? His wife reports that there were no memory changes. STM worse than LTM. He has good and bad days, today is a good one. He has interim periods where he does not recognize his wife.  Patient lives  in [place] with his wife. It is becoming difficult for her as the patient has moments of aggressive behavior and his tremors are "a little bit worse" repeats oneself?  Endorsed "all the time" Disoriented when walking into a room?  Patient denies   Leaving objects in unusual places?  Patient denies   Ambulates  with difficulty?   Endorsed, due to the tremors, he walks slower.  Recent falls?  Patient denies   Any head  injuries?  Patient denies   History of seizures?   Patient denies   Wandering behavior?  Patient denies   Patient drives?   Patient no longer drives  Any mood changes such irritability agitation?  Rexulti has been started by his psychiatrist "will see how it goes"-wife says. He has periods of aggressiveness, hitting furniture, "but he never acts like that to strangers, only to me"-wife reports. It is worse at night.  Any depression?: he has a history o major depression followed by Dr. Casimiro Needle  Hallucinations?  Patient denies   Paranoia?  Patient denies but he is always suspicious. Patient reports that he sleeps well without vivid dreams, REM behavior or sleepwalking   History of sleep apnea?  Patient denies   Any hygiene concerns?  Patient denies but wife says that "it is a job to make him brush his teeth", and" he does not take more than 1 shower a week"  Independent of bathing and dressing?  Endorsed  Does the patient needs help with medications? "He refuses to take his meds" Who is in charge of the finances?   is in charge    Any changes in appetite?  Patient has decreased appetite with associated weight loss  Patient have trouble swallowing? Patient denies   Does the patient cook?  Patient denies   Any kitchen accidents such as leaving the stove on? Patient denies   Any  headaches?  Patient denies   Double vision? Patient denies   Any focal numbness or tingling?  Patient denies   Chronic back pain Patient denies   Unilateral weakness?  Patient denies   Tremors?  He is on primidone 50 mg bid, at 7 am and 6 pm but he is interested in increasing the frequency. He has some trouble with intention an holding objects  " he is spitting more"  Any history of anosmia?  Patient denies   Any incontinence of urine?  Uses pads.  He has a history of prostate cancer  Any bowel dysfunction?   Patient denies        PREVIOUS MEDICATIONS:   CURRENT MEDICATIONS:  Outpatient Encounter Medications as of  02/09/2022  Medication Sig   brexpiprazole (REXULTI) 2 MG TABS tablet Take 1 tablet (2 mg total) by mouth daily.   carboxymethylcellulose (REFRESH PLUS) 0.5 % SOLN 1 drop 3 (three) times daily as needed. One drop each eye 2-3 times daily   cholecalciferol (VITAMIN D3) 25 MCG (1000 UNIT) tablet Take 1,000 Units by mouth daily.   divalproex (DEPAKOTE SPRINKLE) 125 MG capsule Take 3 capsules (375 mg total) by mouth 2 (two) times daily.   donepezil (ARICEPT) 10 MG tablet TAKE 1 TABLET(10 MG) BY MOUTH AT BEDTIME   famotidine (PEPCID) 10 MG tablet Take 10 mg by mouth daily.   finasteride (PROSCAR) 5 MG tablet TAKE 1 TABLET BY MOUTH EVERY DAY   hydrOXYzine (VISTARIL) 25 MG capsule 1 qam  1  qnoon  1  q diner  1 or 2  qday PRN for agitation   ipratropium (ATROVENT) 0.06 % nasal spray USE 2 SPRAYS IEN BID PRN FOR DRAINAGE   levothyroxine (SYNTHROID) 75 MCG tablet TAKE 1 TABLET(75 MCG) BY MOUTH DAILY   mirtazapine (REMERON SOL-TAB) 30 MG disintegrating tablet DISSOLVE 1 TABLET(30 MG) ON THE TONGUE AT BEDTIME   polyethylene glycol powder (GLYCOLAX/MIRALAX) powder Take 17 g by mouth daily.   rosuvastatin (CRESTOR) 20 MG tablet TAKE 1 TABLET(20 MG) BY MOUTH DAILY   sodium fluoride (PREVIDENT 5000 PLUS) 1.1 % CREA dental cream Apply cream to tooth brush. Brush teeth for 2 minutes. Spit out excess. DO NOT rinse afterwards. Repeat nightly.   terazosin (HYTRIN) 2 MG capsule TAKE 1 CAPSULE(2 MG) BY MOUTH DAILY   vortioxetine HBr (TRINTELLIX) 20 MG TABS tablet Take 1 tablet (20 mg total) by mouth daily.   [DISCONTINUED] primidone (MYSOLINE) 50 MG tablet TAKE 1 TABLET(50 MG) BY MOUTH IN THE MORNING AND AT BEDTIME   primidone (MYSOLINE) 50 MG tablet Take 1 tablet (50 mg total) by mouth 3 (three) times daily.   No facility-administered encounter medications on file as of 02/09/2022.       12/11/2017    5:02 PM 06/08/2016    3:27 PM  MMSE - Mini Mental State Exam  Not completed: Unable to complete   Orientation to  time  5  Orientation to time comments  2017,fall,2,Nov.  Orientation to Place  5  Orientation to Place-comments  [place],Dr.[place],Office  Registration  3  Registration-comments  Apple,Table,[place]enny  Attention/ Calculation  4  Attention/Calculation-comments  DLORW  Recall  2  Recall-comments  Apple,Table  Language- name 2 objects  2  Language- name 2 objects-comments  Highlighter, scissors  Language- repeat  1  Language- follow 3 step command  3  Language- read & follow direction  1  Language-read & follow direction-comments  Close Your eye's  Write a sentence  1  Copy  design  1  Copy design-comments  drew picture  Total score  28      07/03/2016    8:58 AM 09/03/2014    1:45 PM  Montreal Cognitive Assessment   Visuospatial/ Executive (0/5) 3 5  Naming (0/3) 3 3  Attention: Read list of digits (0/2) 2 2  Attention: Read list of letters (0/1) 1 1  Attention: Serial 7 subtraction starting at 100 (0/3) 3 3  Language: Repeat phrase (0/2) 2 2  Language : Fluency (0/1) 1 1  Abstraction (0/2) 2 2  Delayed Recall (0/5) 0 2  Orientation (0/6) 6 6  Total 23 27  Adjusted Score (based on education) 23     Objective:     PHYSICAL EXAMINATION:    VITALS:   Vitals:   02/09/22 1521  BP: 120/78  Pulse: 81  Resp: 20  SpO2: 97%  Height: '5\' 7"'$  (1.702 m)    GEN:  The patient appears stated age and is in NAD. HEENT:  Normocephalic, atraumatic.   Neurological examination:  General: NAD, well-groomed, appears stated age. Orientation: The patient is alert. Oriented to person, place and date Cranial nerves: There is good facial symmetry.The speech is fluent and clear. No aphasia or dysarthria. Fund of knowledge is appropriate. Recent and remote memory are impaired. Attention and concentration are reduced.  Able to name objects and repeat phrases.  Hearing is intact to conversational tone.    Sensation: Sensation is intact to light touch throughout Motor: Strength is at  least antigravity x4. DTR's 2/4 in UE/LE     Movement examination: Tone: There is normal tone in the UE/LE Abnormal movements:  intention tremor noted worse when holding an object. Mild cogwheeling R. No resting tremor noted today.  No myoclonus.  No asterixis.   Coordination:  There is  decremation with RAM's and finger to nose  Gait and Station: The patient has no difficulty arising out of a deep-seated chair without the use of the hands. The patient's stride length is short.  Gait is cautious and narrow. [place] sign noted, leaning to the R , good arm swing   Thank you for allowing [place] the opportunity to participate in the care of this nice patient. Please do not hesitate to contact us for any questions or concerns.   Total time spent on today's visit was 20 minutes dedicated to this patient today, preparing to see patient, examining the patient, ordering tests and/or medications and counseling the patient, documenting clinical information in the EHR or other health record, independently interpreting results and communicating results to the patient/family, discussing treatment and goals, answering patient's questions and coordinating care.  Cc:  Denita Lung, MD  Sharene Butters 02/12/2022 9:34 AM

## 2022-02-12 MED ORDER — PRIMIDONE 50 MG PO TABS: 50.0000 mg | ORAL_TABLET | Freq: Three times a day (TID) | ORAL | 1 refills | Status: DC

## 2022-02-14 ENCOUNTER — Telehealth: Payer: Self-pay

## 2022-02-14 NOTE — Telephone Encounter (Signed)
Patient would like new Rx updated at pharmacy for Primidone '50mg'$  tid please send Rx in thank you per your request.

## 2022-02-15 ENCOUNTER — Other Ambulatory Visit (HOSPITAL_COMMUNITY): Payer: Self-pay | Admitting: Psychiatry

## 2022-02-15 ENCOUNTER — Telehealth (HOSPITAL_COMMUNITY): Payer: Self-pay | Admitting: *Deleted

## 2022-02-15 DIAGNOSIS — F324 Major depressive disorder, single episode, in partial remission: Secondary | ICD-10-CM

## 2022-02-15 MED ORDER — DIVALPROEX SODIUM 125 MG PO CSDR: 375.0000 mg | DELAYED_RELEASE_CAPSULE | Freq: Two times a day (BID) | ORAL | 1 refills | Status: DC

## 2022-02-15 MED ORDER — HYDROXYZINE PAMOATE 50 MG PO CAPS: ORAL_CAPSULE | ORAL | 3 refills | Status: DC

## 2022-02-15 NOTE — Telephone Encounter (Signed)
Pt's wife, [name], called and left message stating that pt was very agitated and trying to climb over their patio enclosure. Mrs. Glaze stated that she felt the Vistaril 25 mg was increasing the agitation. When writer returned call spouse had already given pt a second dose of Vistaril 25 mg, as ordered, and pt was calmer. Pt is requesting a change in medication for anxiety/agitation. Pt's next appointment is scheduled for 03/07/22.

## 2022-02-16 ENCOUNTER — Telehealth: Payer: Self-pay | Admitting: Family Medicine

## 2022-02-16 NOTE — Telephone Encounter (Signed)
Pt wife called and states that PA sara wertman suggested that pt go on a appetite suppressant and that you would need to prescribe this for him,  Pt uses [facility], [address] AT [address]  She can be reached at [phone]

## 2022-02-17 NOTE — Telephone Encounter (Signed)
I do not see anything concerning a appetite suppressant . [name]

## 2022-02-17 NOTE — Telephone Encounter (Signed)
Pt wife advised that he needs a appetite stimulant . Pt was made a virtual appointment because it is more difficult to get pt to his appointments. [place]

## 2022-02-20 ENCOUNTER — Telehealth (INDEPENDENT_AMBULATORY_CARE_PROVIDER_SITE_OTHER): Payer: Medicare Other | Admitting: Family Medicine

## 2022-02-20 ENCOUNTER — Encounter: Payer: Self-pay | Admitting: Family Medicine

## 2022-02-20 ENCOUNTER — Telehealth: Payer: Self-pay | Admitting: Family Medicine

## 2022-02-20 VITALS — Ht 67.0 in | Wt 121.0 lb

## 2022-02-20 DIAGNOSIS — F32A Depression, unspecified: Secondary | ICD-10-CM

## 2022-02-20 DIAGNOSIS — G309 Alzheimer's disease, unspecified: Secondary | ICD-10-CM | POA: Diagnosis not present

## 2022-02-20 DIAGNOSIS — F02A0 Dementia in other diseases classified elsewhere, mild, without behavioral disturbance, psychotic disturbance, mood disturbance, and anxiety: Secondary | ICD-10-CM | POA: Diagnosis not present

## 2022-02-20 NOTE — Progress Notes (Signed)
   Subjective:    Patient ID: [name], male    DOB: [birth year], 76 y.o.   MRN: [id number]  HPI Documentation for virtual audio and video telecommunications through [place] encounter: The patient was located at home. 2 patient identifiers used.  The provider was located in the office. The patient did consent to this visit and is aware of possible charges through their insurance for this visit. The other persons participating in this telemedicine service was his wife Time spent on call was 10 minutes and in review of previous records >25 minutes total for counseling and coordination of care. This virtual service is not related to other E/M service within previous 7 days.  She states that he is becoming more disoriented, hallucinating and unable to complete full sentences.  They are both in assisted living but she is really getting overwhelmed by all of this.  She would like an FL 2 filled out for possibly getting him moved to a memory care unit.  She has seen Dr. Casimiro Needle in the past and apparently the agitation is much better.  She is having trouble feeding him and getting his medications down.  Review of Systems     Objective:   Physical Exam Alert and in no distress otherwise not examined       Assessment & Plan:   Mild Alzheimer's dementia, unspecified timing of dementia onset, unspecified whether behavioral, psychotic, or mood disturbance or anxiety ([place])  Depression, unspecified depression type I think is time for him to be placed in a memory care unit.  His wife is getting overwhelmed by all of this.

## 2022-02-20 NOTE — Telephone Encounter (Signed)
Per our conversation earlier pt wife is having a hard time getting him here. We came to the conclusion that we will start with virtual. [facility]

## 2022-02-20 NOTE — Telephone Encounter (Signed)
Pt's wife called and states that form needs to be sent to:  [facility]  Attn: Meta Hatchet  Fax [phone]  ALSO they needs orders for a UA and they will get at facility.

## 2022-02-21 ENCOUNTER — Encounter: Payer: Self-pay | Admitting: Family Medicine

## 2022-02-21 NOTE — Telephone Encounter (Signed)
Pt wif was advise that form and order have been faxed. [place]

## 2022-02-22 NOTE — Telephone Encounter (Signed)
Tammy called and says she got the first FL2 form but they need another one filled out by Dr.Lalonde saying it is for SNF instead of memory loss. States you can fax to the same fax and to RN Haynes Bast.

## 2022-02-23 NOTE — Telephone Encounter (Signed)
[facility] called and is requesting pt vaccines, allgeries and h and P records to also be sent when the fl2 is sent

## 2022-02-24 NOTE — Telephone Encounter (Signed)
Faxed what we had and lvm to find out what H and P info is needed. [place]

## 2022-02-28 NOTE — Telephone Encounter (Signed)
Done [facility] 

## 2022-03-01 ENCOUNTER — Telehealth: Payer: Self-pay | Admitting: Family Medicine

## 2022-03-01 NOTE — Telephone Encounter (Signed)
Lvm advising this was done yesterday. [place]

## 2022-03-01 NOTE — Telephone Encounter (Signed)
[name] from [facility] called in and states they need a FL2 with the dosage the pt takes with his medications. Provided fax number [phone], call back number [phone].

## 2022-03-07 ENCOUNTER — Ambulatory Visit (HOSPITAL_COMMUNITY): Payer: Medicare Other | Admitting: Psychiatry

## 2022-03-08 ENCOUNTER — Non-Acute Institutional Stay (SKILLED_NURSING_FACILITY): Payer: Medicare Other | Admitting: Orthopedic Surgery

## 2022-03-08 ENCOUNTER — Encounter: Payer: Self-pay | Admitting: Orthopedic Surgery

## 2022-03-08 DIAGNOSIS — E291 Testicular hypofunction: Secondary | ICD-10-CM

## 2022-03-08 DIAGNOSIS — E7800 Pure hypercholesterolemia, unspecified: Secondary | ICD-10-CM | POA: Diagnosis not present

## 2022-03-08 DIAGNOSIS — G308 Other Alzheimer's disease: Secondary | ICD-10-CM | POA: Diagnosis not present

## 2022-03-08 DIAGNOSIS — F324 Major depressive disorder, single episode, in partial remission: Secondary | ICD-10-CM

## 2022-03-08 DIAGNOSIS — I10 Essential (primary) hypertension: Secondary | ICD-10-CM

## 2022-03-08 DIAGNOSIS — G4733 Obstructive sleep apnea (adult) (pediatric): Secondary | ICD-10-CM

## 2022-03-08 DIAGNOSIS — G250 Essential tremor: Secondary | ICD-10-CM

## 2022-03-08 DIAGNOSIS — N401 Enlarged prostate with lower urinary tract symptoms: Secondary | ICD-10-CM

## 2022-03-08 DIAGNOSIS — I700 Atherosclerosis of aorta: Secondary | ICD-10-CM

## 2022-03-08 DIAGNOSIS — C01 Malignant neoplasm of base of tongue: Secondary | ICD-10-CM

## 2022-03-08 DIAGNOSIS — F02B11 Dementia in other diseases classified elsewhere, moderate, with agitation: Secondary | ICD-10-CM

## 2022-03-08 DIAGNOSIS — R338 Other retention of urine: Secondary | ICD-10-CM

## 2022-03-08 MED ORDER — LORAZEPAM 0.5 MG PO TABS: 0.5000 mg | ORAL_TABLET | Freq: Four times a day (QID) | ORAL | 1 refills | Status: DC | PRN

## 2022-03-08 NOTE — Progress Notes (Addendum)
Location:  [facility] Room Number: NO/30/A Place of Service:  SNF ([phone]) Provider:  Windell Moulding, NP  Denita Lung, MD  Patient Care Team: Denita Lung, MD as PCP - General (Family Medicine) Francina Ames, MD as Referring Physician (Otolaryngology) Eppie Gibson, MD as Attending Physician (Radiation Oncology) Leota Sauers, RN (Inactive) as Oncology Nurse Navigator Lenn Cal, DDS (Inactive) as Consulting Physician (Dentistry) Tat, Eustace Quail, DO as Consulting Physician (Neurology) Alla Feeling, NP as Nurse Practitioner (Nurse Practitioner)  Extended Emergency Contact Information Primary Emergency Contact: Urick,[name] Address: [address]          [place], [place] [address] Johnnette Litter of [place] Phone: [phone] Mobile Phone: [phone] Relation: Spouse  Code Status:  FULL Goals of care: Advanced Directive information    03/08/2022   10:11 AM  Advanced Directives  Does Patient Have a Medical Advance Directive? Yes  Type of Advance Directive Living will  Does patient want to make changes to medical advance directive? No - Patient declined     Chief Complaint  Patient presents with   Acute Visit    agitation    HPI:  Pt is a 76 y.o. male seen today for an acute visit for agitation.   Admitted to skilled nursing unit at [facility] due to advanced dementia. PMH: HTN, aortic atherosclerosis, OSA, GERD, AD, BPH, hypogonadism, tongue cancer, prostate cancer, testicular cancer, and depression.   Wife provides history today. Moved to [place] 01/2022. Increased agitation began 10/2021, worsened with move to [place]. Requires assistance with all ADLs. Noncompliant with taking medications. He will place medications in mouth, drink water and spit them out. Unknown which medications he has consistently taken in last 2 weeks. Frequent falls, no apparent injury. He is followed by neurology for AD and essential tremor.  He is followed by Dr. Casimiro Needle for depression and behaviors. 07/12 Depakote increased to 375 mg po BID. Hydroxyzine also started. Remains on Aricept, Namenda, Rexulti, Remeron and Trintellix. He was scheduled to follow up with Dr. Casimiro Needle 08/01, but appointment was cancelled due to SNF move.   Nursing staff reports he was spitting, grabbing and hitting towards staff 03/07/2022. On call prescribed Haldol 0.5 mg IM once and behaviors subsided. He has trouble completing sentences or answering questions due to aphasia. He is able to state if he is hungry/thirsty/pain. After encounter, I observed him wandering unit trying to enter other residents rooms.    Past Medical History:  Diagnosis Date   Allergy    Alzheimer disease ([place]) 06/2018   diagnosed with early stage alzheimers   Arthritis    osteoarthritis   Cancer ([place])    testicular, age 76, orchiectomy 1992   Cataract    B/L CATARACTS NO SURGERY   Dementia ([place])    Depression    Dyslipidemia    Heartburn    TAKES ZANTAC   History of radiation therapy 12/02/18- 01/14/19   Head and neck/ base of tongue. 60 Gy over 30 fractions.    Hypertension    Hypogonadism male    Prostate cancer ([place]) 06/07/11   gleason 3+3=6, vol 59.5 cc   Sleep apnea    Status post chemotherapy    testicular cancer 1992, Dr Beryle Beams   Past Surgical History:  Procedure Laterality Date   APPENDECTOMY     COLONOSCOPY  08/07/2005   gessner   DIRECT LARYNGOSCOPY N/A 08/19/2018   Procedure: DIRECT LARYNGOSCOPY;  Surgeon: Leta Baptist, MD;  Location: [facility];  Service: ENT;  Laterality: N/A;   INGUINAL HERNIA REPAIR     w/orchiectomy 1992, retroperitoneal lymph node dissection   IR GASTROSTOMY TUBE REMOVAL  02/28/2019   MENISECTOMY     R&L knee surgeries   PROSTATE SURGERY     biopsy x 2   SHX1  06/13/2021   right inferior mid medial external ear antehelix shave   shx1 Left 11/21/2021   epidermoid cyst inflamed and disrupted   SKIN BIOPSY  Right 01/09/2022   chondrodermatitis nodularis helicis , 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   carboxymethylcellulose (REFRESH PLUS) 0.5 % SOLN 1 drop 3 (three) times daily as needed. One drop each eye 2-3 times daily   cholecalciferol (VITAMIN D3) 25 MCG (1000 UNIT) tablet Take 1,000 Units by mouth daily.   divalproex (DEPAKOTE SPRINKLE) 125 MG capsule Take 3 capsules (375 mg total) by mouth 2 (two) times daily. 4  bid   donepezil (ARICEPT) 10 MG tablet TAKE 1 TABLET(10 MG) BY MOUTH AT BEDTIME   finasteride (PROSCAR) 5 MG tablet TAKE 1 TABLET BY MOUTH EVERY DAY   hydrOXYzine (VISTARIL) 50 MG capsule 1 tid   1  prn   ipratropium (ATROVENT) 0.06 % nasal spray USE 2 SPRAYS IEN BID PRN FOR DRAINAGE   levothyroxine (SYNTHROID) 75 MCG tablet TAKE 1 TABLET(75 MCG) BY MOUTH DAILY   mirtazapine (REMERON SOL-TAB) 30 MG disintegrating tablet DISSOLVE 1 TABLET(30 MG) ON THE TONGUE AT BEDTIME   polyethylene glycol powder (GLYCOLAX/MIRALAX) powder Take 17 g by mouth daily.   primidone (MYSOLINE) 50 MG tablet Take 1 tablet (50 mg total) by mouth 3 (three) times daily.   rosuvastatin (CRESTOR) 20 MG tablet TAKE 1 TABLET(20 MG) BY MOUTH DAILY   sodium fluoride (PREVIDENT 5000 PLUS) 1.1 % CREA dental cream Apply cream to tooth brush. Brush teeth for 2 minutes. Spit out excess. DO NOT rinse afterwards. Repeat nightly.   terazosin (HYTRIN) 2 MG capsule TAKE 1 CAPSULE(2 MG) BY MOUTH DAILY   vortioxetine HBr (TRINTELLIX) 20 MG TABS tablet Take 1 tablet (20 mg total) by mouth daily.   [DISCONTINUED] famotidine (PEPCID) 10 MG tablet Take 10 mg by mouth daily. (Patient not taking: Reported  on 02/20/2022)   No facility-administered encounter medications on file as of 03/08/2022.    Review of Systems  Unable to perform ROS: Dementia    Immunization History  Administered Date(s) Administered   Influenza Split 04/18/2012, 05/06/2013, 05/27/2015   Influenza Whole 04/12/2011   Influenza, High Dose Seasonal PF 05/21/2017, 05/16/2018, 04/11/2019, 05/09/2020   Influenza-Unspecified 04/23/2014, 05/18/2016, 05/21/2017, 05/16/2018, 05/09/2020, 04/19/2021   PFIZER Comirnaty(Gray Top)Covid-19 Tri-Sucrose Vaccine 11/16/2020   PFIZER(Purple Top)SARS-COV-2 Vaccination 09/19/2019, 10/03/2019, 04/06/2020   Pneumococcal Conjugate-13 08/31/2015   Pneumococcal Polysaccharide-23 12/09/2009   Tdap 01/17/2000, 07/25/2012   Zoster Recombinat (Shingrix) 05/04/2021, 11/01/2021   Zoster, Live 12/08/2008   Pertinent  Health Maintenance Due  Topic Date Due   INFLUENZA VACCINE  03/07/2022      01/28/2021    2:37 PM 08/12/2021    2:55 PM 11/02/2021    2:24 PM 11/23/2021    2:25 PM 02/09/2022  3:21 PM  Fall Risk  Falls in the past year?  0 0 0 0  Was there an injury with Fall?  0 0 0 0  Fall Risk Category Calculator  0 0 0 0  Fall Risk Category  Low Low Low Low  Patient Fall Risk Level Low fall risk Low fall risk Low fall risk Low fall risk Moderate fall risk  Patient at Risk for Falls Due to   No Fall Risks No Fall Risks   Fall risk Follow up   Falls evaluation completed Falls evaluation completed    Functional Status Survey:    Vitals:   03/08/22 1009  BP: 113/74  Pulse: 74  Resp: 19  Temp: (!) 96.2 F (35.7 C)  SpO2: (!) 88%  Weight: 136 lb (61.7 kg)  Height: '5\' 6"'$  (1.676 m)   Body mass index is 21.95 kg/m. Physical Exam Vitals reviewed.  Constitutional:      General: He is not in acute distress. HENT:     Head: Normocephalic.     Right Ear: There is no impacted cerumen.     Left Ear: There is no impacted cerumen.     Nose: Nose normal.     Mouth/Throat:     Mouth: Mucous  membranes are moist.  Eyes:     General:        Right eye: No discharge.        Left eye: No discharge.  Cardiovascular:     Rate and Rhythm: Normal rate and regular rhythm.     Pulses: Normal pulses.     Heart sounds: Normal heart sounds.  Pulmonary:     Effort: Pulmonary effort is normal. No respiratory distress.     Breath sounds: Normal breath sounds. No wheezing.  Abdominal:     General: Bowel sounds are normal. There is no distension.     Palpations: Abdomen is soft.     Tenderness: There is no abdominal tenderness.  Musculoskeletal:     Cervical back: Neck supple.     Right lower leg: No edema.     Left lower leg: No edema.  Skin:    General: Skin is warm and dry.     Capillary Refill: Capillary refill takes less than 2 seconds.  Neurological:     General: No focal deficit present.     Mental Status: He is alert. Mental status is at baseline.     Motor: Weakness present.     Gait: Gait abnormal.  Psychiatric:        Attention and Perception: Attention normal.        Mood and Affect: Affect is flat.        Cognition and Memory: Cognition is impaired. Memory is impaired.     Comments: Follows some commands, alert to self      Labs reviewed: Recent Labs    08/30/21 1647 11/23/21 1526 01/19/22 0734  NA  --  140 143  K  --  4.4 4.3  CL  --  103 104  CO2  --  25 22  GLUCOSE  --  97 87  BUN  --  15 12  CREATININE 0.90 0.89 0.90  CALCIUM  --  9.5 9.2   Recent Labs    11/23/21 1526 01/19/22 0734  AST 20 31  ALT 21 36  ALKPHOS 68 54  BILITOT 0.4 0.4  PROT 6.4 6.3  ALBUMIN 4.6 4.4   Recent Labs    11/23/21 1526 01/19/22 0734  WBC 3.1* 2.5*  NEUTROABS 2.1 1.3*  HGB 13.5 15.1  HCT 40.3 42.1  MCV 92 91  PLT 176 169   Lab Results  Component Value Date   TSH 3.110 11/23/2021   No results found for: "HGBA1C" Lab Results  Component Value Date   CHOL 110 11/23/2021   HDL 49 11/23/2021   LDLCALC 43 11/23/2021   TRIG 93 11/23/2021   CHOLHDL 2.2  11/23/2021    Significant Diagnostic Results in last 30 days:  No results found.  Assessment/Plan 1. 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 Casimiro Needle 08/18 - recommend memory care  - cbc/diff - cmp - valproic acid  2. Primary hypertension - BUN/creat 12/0.90 01/19/2022 - controlled without medication  3. Pure hypercholesterolemia - LDL 50 11/16/2020 - cont Crestor  4. Benign prostatic hyperplasia with urinary retention - cont finasteride and terazosin   5. Hypogonadism in male   99. OSA (obstructive sleep apnea) - Not on CPAP  7. Major depressive disorder with single episode, in partial remission ([place]) - followed by Dr. Casimiro Needle - cont Remeron and Trintellix  8. Essential tremor - followed by neurology - cont primidone  9. Aortic atherosclerosis ([facility]) - cont statin  10. Malignant neoplasm of base of tongue - followed by ENT - diagnosed 08/2018 - TORS resection with bilateral neck dissection 10/21/2018 - s/p radiation summer 2020   Family/ staff Communication: plan discussed with patient, wife and nurse  Labs/tests ordered:  F/u Dr. Casimiro Needle, cbc/diff, cmp, valproic acid level

## 2022-03-09 ENCOUNTER — Encounter: Payer: Self-pay | Admitting: Internal Medicine

## 2022-03-09 ENCOUNTER — Non-Acute Institutional Stay (SKILLED_NURSING_FACILITY): Payer: Medicare Other | Admitting: Internal Medicine

## 2022-03-09 DIAGNOSIS — G308 Other Alzheimer's disease: Secondary | ICD-10-CM | POA: Diagnosis not present

## 2022-03-09 DIAGNOSIS — R338 Other retention of urine: Secondary | ICD-10-CM

## 2022-03-09 DIAGNOSIS — I10 Essential (primary) hypertension: Secondary | ICD-10-CM

## 2022-03-09 DIAGNOSIS — N401 Enlarged prostate with lower urinary tract symptoms: Secondary | ICD-10-CM

## 2022-03-09 DIAGNOSIS — E7800 Pure hypercholesterolemia, unspecified: Secondary | ICD-10-CM

## 2022-03-09 DIAGNOSIS — C01 Malignant neoplasm of base of tongue: Secondary | ICD-10-CM

## 2022-03-09 DIAGNOSIS — F02B11 Dementia in other diseases classified elsewhere, moderate, with agitation: Secondary | ICD-10-CM

## 2022-03-09 DIAGNOSIS — F333 Major depressive disorder, recurrent, severe with psychotic symptoms: Secondary | ICD-10-CM | POA: Diagnosis not present

## 2022-03-09 DIAGNOSIS — G250 Essential tremor: Secondary | ICD-10-CM

## 2022-03-09 LAB — BASIC METABOLIC PANEL
BUN: 37 — AB (ref 4–21)
CO2: 30 — AB (ref 13–22)
Chloride: 104 (ref 99–108)
Creatinine: 1 (ref 0.6–1.3)
Glucose: 104
Potassium: 3.6 mEq/L (ref 3.5–5.1)
Sodium: 143 (ref 137–147)

## 2022-03-09 LAB — HEPATIC FUNCTION PANEL
ALT: 37 U/L (ref 10–40)
AST: 44 — AB (ref 14–40)
Alkaline Phosphatase: 52 (ref 25–125)
Bilirubin, Total: 0.7

## 2022-03-09 LAB — COMPREHENSIVE METABOLIC PANEL
Albumin: 4.4 (ref 3.5–5.0)
Calcium: 9.6 (ref 8.7–10.7)
Globulin: 1.9

## 2022-03-09 LAB — CBC AND DIFFERENTIAL
HCT: 40 — AB (ref 41–53)
Hemoglobin: 13.8 (ref 13.5–17.5)
Platelets: 155 10*3/uL (ref 150–400)
WBC: 3.5

## 2022-03-09 LAB — CBC: RBC: 4.25 (ref 3.87–5.11)
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
## 2022-03-10 ENCOUNTER — Other Ambulatory Visit: Payer: Self-pay | Admitting: Orthopedic Surgery

## 2022-03-10 DIAGNOSIS — G308 Other Alzheimer's disease: Secondary | ICD-10-CM

## 2022-03-10 DIAGNOSIS — F333 Major depressive disorder, recurrent, severe with psychotic symptoms: Secondary | ICD-10-CM

## 2022-03-10 MED ORDER — LORAZEPAM 1 MG PO TABS: 1.0000 mg | ORAL_TABLET | Freq: Every evening | ORAL | 0 refills | Status: AC | PRN

## 2022-03-10 NOTE — Progress Notes (Signed)
Increased period of wandering and entering residents rooms. Will start Ativan 1 mg po qhs prn x 14 days.

## 2022-03-13 ENCOUNTER — Non-Acute Institutional Stay (SKILLED_NURSING_FACILITY): Payer: Medicare Other | Admitting: Orthopedic Surgery

## 2022-03-13 ENCOUNTER — Encounter: Payer: Self-pay | Admitting: Orthopedic Surgery

## 2022-03-13 DIAGNOSIS — F02B11 Dementia in other diseases classified elsewhere, moderate, with agitation: Secondary | ICD-10-CM | POA: Diagnosis not present

## 2022-03-13 DIAGNOSIS — R2681 Unsteadiness on feet: Secondary | ICD-10-CM

## 2022-03-13 DIAGNOSIS — G308 Other Alzheimer's disease: Secondary | ICD-10-CM

## 2022-03-13 DIAGNOSIS — F333 Major depressive disorder, recurrent, severe with psychotic symptoms: Secondary | ICD-10-CM | POA: Diagnosis not present

## 2022-03-13 NOTE — Progress Notes (Signed)
Location:   [facility] Room Number: 30-A Place of Service:  SNF [phone]) Provider:  Windell Moulding, NP  PCP: Virgie Dad, MD  Patient Care Team: Virgie Dad, MD as PCP - General (Internal Medicine) Francina Ames, MD as Referring Physician (Otolaryngology) Eppie Gibson, MD as Attending Physician (Radiation Oncology) Leota Sauers, RN (Inactive) as Oncology Nurse Navigator Lenn Cal, DDS (Inactive) as Consulting Physician (Dentistry) Tat, Eustace Quail, DO as Consulting Physician (Neurology) Alla Feeling, NP as Nurse Practitioner (Nurse Practitioner)  Extended Emergency Contact Information Primary Emergency Contact: Kostka,[name] Address: [address]          [place], [place] [address] Johnnette Litter of [place] Phone: [phone] Mobile Phone: [phone] Relation: Spouse  Code Status:  FULL CODE Goals of care: Advanced Directive information    03/13/2022    9:29 AM  Advanced Directives  Does Patient Have a Medical Advance Directive? Yes  Type of Paramedic of [place];Living will  Does patient want to make changes to medical advance directive? No - Patient declined  Copy of [place] in Chart? No - copy requested     Chief Complaint  Patient presents with   Acute Visit    Agitation.     HPI:  Pt is a 76 y.o. male seen today for an acute visit for agitation.   Admitted 03/07/2022 to skilled nursing unit at [facility] due to advanced dementia. PMH: HTN, aortic atherosclerosis, OSA, GERD, AD, BPH, hypogonadism, tongue cancer, prostate cancer, testicular cancer, and depression.   This morning he was observed spitting out medications and swinging at staff. Nursing reports he was non compliant with taking medications at times over the weekend. He is currently followed by neurology for dementia and essential tremor. He is also followed by Dr. Casimiro Needle for depression and behaviors. He is  currently taking Depakote, Aricept, Rexulti, Remeron and Trintellix. Since, admission he has been receiving Ativan scheduled and prn. He was given Haldol 0.5 IM once 08/01 due to aggression and hitting staff, behaviors resolved after administration. He is scheduled to f/u with Dr. Casimiro Needle 08/18.    Past Medical History:  Diagnosis Date   Allergy    Alzheimer disease ([place]) 06/2018   diagnosed with early stage alzheimers   Arthritis    osteoarthritis   Cancer ([place])    testicular, age 48, orchiectomy 1992   Cataract    B/L CATARACTS NO SURGERY   Dementia ([place])    Depression    Dyslipidemia    Heartburn    TAKES ZANTAC   History of radiation therapy 12/02/18- 01/14/19   Head and neck/ base of tongue. 60 Gy over 30 fractions.    Hypertension    Hypogonadism male    Prostate cancer ([place]) 06/07/11   gleason 3+3=6, vol 59.5 cc   Sleep apnea    Status post chemotherapy    testicular cancer 1992, Dr Beryle Beams   Past Surgical History:  Procedure Laterality Date   APPENDECTOMY     COLONOSCOPY  08/07/2005   gessner   DIRECT LARYNGOSCOPY N/A 08/19/2018   Procedure: DIRECT LARYNGOSCOPY;  Surgeon: Leta Baptist, MD;  Location: [facility];  Service: ENT;  Laterality: N/A;   INGUINAL HERNIA REPAIR     w/orchiectomy 1992, retroperitoneal lymph node dissection   IR GASTROSTOMY TUBE REMOVAL  02/28/2019   MENISECTOMY     R&L knee surgeries   PROSTATE SURGERY     biopsy x  2   SHX1  06/13/2021   right inferior mid medial external ear antehelix shave   shx1 Left 11/21/2021   epidermoid cyst inflamed and disrupted   SKIN BIOPSY Right 01/09/2022   chondrodermatitis nodularis helicis , 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   TONGUE BIOPSY N/A 08/19/2018   Procedure: BIOPSY OF TONGUE BASE MASS;  Surgeon: Leta Baptist, MD;  Location: [facility];  Service: ENT;  Laterality: N/A;    No Known Allergies  Allergies as of 03/13/2022   No Known Allergies      Medication List         Accurate as of March 13, 2022  9:29 AM. If you have any questions, ask your nurse or doctor.          brexpiprazole 2 MG Tabs tablet Commonly known as: Rexulti Take 1 tablet (2 mg total) by mouth daily.   carboxymethylcellulose 0.5 % Soln Commonly known as: REFRESH PLUS 1 drop 3 (three) times daily as needed. One drop each eye 2-3 times daily   cholecalciferol 25 MCG (1000 UNIT) tablet Commonly known as: VITAMIN D3 Take 1,000 Units by mouth daily.   divalproex 125 MG capsule Commonly known as: DEPAKOTE SPRINKLE Take 3 capsules (375 mg total) by mouth 2 (two) times daily. 4  bid   donepezil 10 MG tablet Commonly known as: ARICEPT TAKE 1 TABLET(10 MG) BY MOUTH AT BEDTIME   famotidine 10 MG tablet Commonly known as: PEPCID Take 10 mg by mouth in the morning.   finasteride 5 MG tablet Commonly known as: PROSCAR TAKE 1 TABLET BY MOUTH EVERY DAY   ipratropium 0.06 % nasal spray Commonly known as: ATROVENT USE 2 SPRAYS IEN BID PRN FOR DRAINAGE   levothyroxine 75 MCG tablet Commonly known as: SYNTHROID TAKE 1 TABLET(75 MCG) BY MOUTH DAILY   LORazepam 0.5 MG tablet Commonly known as: ATIVAN Take 0.5 mg by mouth daily. Give at 5 pm with dinner   LORazepam 0.5 MG tablet Commonly known as: ATIVAN Take 1 tablet (0.5 mg total) by mouth every 6 (six) hours as needed for anxiety.   LORazepam 1 MG tablet Commonly known as: Ativan Take 1 tablet (1 mg total) by mouth at bedtime as needed for up to 14 days for anxiety.   memantine 10 MG tablet Commonly known as: NAMENDA Take 10 mg by mouth 2 (two) times daily.   mirtazapine 30 MG disintegrating tablet Commonly known as: REMERON SOL-TAB DISSOLVE 1 TABLET(30 MG) ON THE TONGUE AT BEDTIME   polyethylene glycol powder 17 GM/SCOOP powder Commonly known as: GLYCOLAX/MIRALAX Take 17 g by mouth daily.   primidone 50 MG tablet Commonly known as: MYSOLINE Take 1 tablet (50 mg total) by mouth 3 (three) times daily.    rosuvastatin 20 MG tablet Commonly known as: CRESTOR TAKE 1 TABLET(20 MG) BY MOUTH DAILY   sodium fluoride 1.1 % Crea dental cream Commonly known as: PreviDent 5000 Plus Apply cream to tooth brush. Brush teeth for 2 minutes. Spit out excess. DO NOT rinse afterwards. Repeat nightly.   terazosin 2 MG capsule Commonly known as: HYTRIN TAKE 1 CAPSULE(2 MG) BY MOUTH DAILY   vortioxetine HBr 20 MG Tabs tablet Commonly known as: Trintellix Take 1 tablet (20 mg total) by mouth daily.   zinc oxide 20 % ointment Apply 1 Application topically as needed for irritation.        Review of Systems  Unable to perform ROS: Dementia    Immunization History  Administered Date(s) Administered  Influenza Split 04/18/2012, 05/06/2013, 05/27/2015   Influenza Whole 04/12/2011   Influenza, High Dose Seasonal PF 05/21/2017, 05/16/2018, 04/11/2019, 05/09/2020   Influenza-Unspecified 04/23/2014, 05/18/2016, 05/21/2017, 05/16/2018, 05/09/2020, 04/19/2021   PFIZER Comirnaty(Gray Top)Covid-19 Tri-Sucrose Vaccine 11/16/2020   PFIZER(Purple Top)SARS-COV-2 Vaccination 09/19/2019, 10/03/2019, 04/06/2020   Pneumococcal Conjugate-13 08/31/2015   Pneumococcal Polysaccharide-23 12/09/2009   Tdap 01/17/2000, 07/25/2012   Zoster Recombinat (Shingrix) 05/04/2021, 11/01/2021   Zoster, Live 12/08/2008   Pertinent  Health Maintenance Due  Topic Date Due   INFLUENZA VACCINE  03/07/2022      01/28/2021    2:37 PM 08/12/2021    2:55 PM 11/02/2021    2:24 PM 11/23/2021    2:25 PM 02/09/2022    3:21 PM  Fall Risk  Falls in the past year?  0 0 0 0  Was there an injury with Fall?  0 0 0 0  Fall Risk Category Calculator  0 0 0 0  Fall Risk Category  Low Low Low Low  Patient Fall Risk Level Low fall risk Low fall risk Low fall risk Low fall risk Moderate fall risk  Patient at Risk for Falls Due to   No Fall Risks No Fall Risks   Fall risk Follow up   Falls evaluation completed Falls evaluation completed     Functional Status Survey:    Vitals:   03/13/22 0924  BP: 127/72  Pulse: 74  Resp: 20  Temp: (!) 97.3 F (36.3 C)  SpO2: 95%  Weight: 136 lb (61.7 kg)  Height: '5\' 6"'$  (1.676 m)   Body mass index is 21.95 kg/m. Physical Exam Vitals reviewed.  Constitutional:      General: He is not in acute distress. Eyes:     General:        Right eye: No discharge.        Left eye: No discharge.  Cardiovascular:     Rate and Rhythm: Normal rate and regular rhythm.     Pulses: Normal pulses.     Heart sounds: Normal heart sounds.  Pulmonary:     Effort: Pulmonary effort is normal. No respiratory distress.     Breath sounds: Normal breath sounds. No wheezing.  Abdominal:     General: Bowel sounds are normal. There is no distension.     Palpations: Abdomen is soft.     Tenderness: There is no abdominal tenderness.  Musculoskeletal:     Cervical back: Neck supple.     Right lower leg: No edema.     Left lower leg: No edema.  Skin:    General: Skin is warm and dry.     Capillary Refill: Capillary refill takes less than 2 seconds.  Neurological:     General: No focal deficit present.     Mental Status: He is alert. Mental status is at baseline.     Motor: Weakness present.     Gait: Gait abnormal.  Psychiatric:        Attention and Perception: He perceives visual hallucinations.        Mood and Affect: Affect is inappropriate.        Cognition and Memory: Cognition is impaired. Memory is impaired.     Comments: Aphasia, alert to self     Labs reviewed: Recent Labs    11/23/21 1526 01/19/22 0734 03/09/22 0000  NA 140 143 143  K 4.4 4.3 3.6  CL 103 104 104  CO2 25 22 30*  GLUCOSE 97 87  --   BUN 15  12 37*  CREATININE 0.89 0.90 1.0  CALCIUM 9.5 9.2 9.6   Recent Labs    11/23/21 1526 01/19/22 0734 03/09/22 0000  AST 20 31 44*  ALT 21 36 37  ALKPHOS 68 54 52  BILITOT 0.4 0.4  --   PROT 6.4 6.3  --   ALBUMIN 4.6 4.4 4.4   Recent Labs    11/23/21 1526  01/19/22 0734 03/09/22 0000  WBC 3.1* 2.5* 3.5  NEUTROABS 2.1 1.3*  --   HGB 13.5 15.1 13.8  HCT 40.3 42.1 40*  MCV 92 91  --   PLT 176 169 155   Lab Results  Component Value Date   TSH 3.110 11/23/2021   No results found for: "HGBA1C" Lab Results  Component Value Date   CHOL 110 11/23/2021   HDL 49 11/23/2021   LDLCALC 43 11/23/2021   TRIG 93 11/23/2021   CHOLHDL 2.2 11/23/2021    Significant Diagnostic Results in last 30 days:  No results found.  Assessment/Plan 1. 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 Casimiro Needle- next visit 08/18 - cont Aricept and Namenda - cont Depakote and Rexulti  2. Severe episode of recurrent major depressive disorder, with psychotic features ([place]) - cont Remeron and Trintellix - see above  3. Unstable gait - poor safety awareness - high risk for falls - cont PT   Family/ staff Communication: plan discussed with patient, wife, and nurse  Labs/tests ordered: none

## 2022-03-16 ENCOUNTER — Non-Acute Institutional Stay (SKILLED_NURSING_FACILITY): Payer: Medicare Other | Admitting: Internal Medicine

## 2022-03-16 ENCOUNTER — Encounter: Payer: Self-pay | Admitting: Internal Medicine

## 2022-03-16 DIAGNOSIS — F02B11 Dementia in other diseases classified elsewhere, moderate, with agitation: Secondary | ICD-10-CM

## 2022-03-16 DIAGNOSIS — R2681 Unsteadiness on feet: Secondary | ICD-10-CM | POA: Diagnosis not present

## 2022-03-16 DIAGNOSIS — G308 Other Alzheimer's disease: Secondary | ICD-10-CM | POA: Diagnosis not present

## 2022-03-16 DIAGNOSIS — F333 Major depressive disorder, recurrent, severe with psychotic symptoms: Secondary | ICD-10-CM | POA: Diagnosis not present
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
## 2022-03-22 ENCOUNTER — Non-Acute Institutional Stay (SKILLED_NURSING_FACILITY): Payer: Medicare Other | Admitting: Nurse Practitioner

## 2022-03-22 ENCOUNTER — Encounter: Payer: Self-pay | Admitting: Nurse Practitioner

## 2022-03-22 DIAGNOSIS — G309 Alzheimer's disease, unspecified: Secondary | ICD-10-CM | POA: Diagnosis not present

## 2022-03-22 DIAGNOSIS — N401 Enlarged prostate with lower urinary tract symptoms: Secondary | ICD-10-CM

## 2022-03-22 DIAGNOSIS — K219 Gastro-esophageal reflux disease without esophagitis: Secondary | ICD-10-CM

## 2022-03-22 DIAGNOSIS — E038 Other specified hypothyroidism: Secondary | ICD-10-CM

## 2022-03-22 DIAGNOSIS — F323 Major depressive disorder, single episode, severe with psychotic features: Secondary | ICD-10-CM | POA: Diagnosis not present

## 2022-03-22 DIAGNOSIS — R269 Unspecified abnormalities of gait and mobility: Secondary | ICD-10-CM

## 2022-03-22 DIAGNOSIS — K5901 Slow transit constipation: Secondary | ICD-10-CM | POA: Diagnosis not present

## 2022-03-22 DIAGNOSIS — F02A0 Dementia in other diseases classified elsewhere, mild, without behavioral disturbance, psychotic disturbance, mood disturbance, and anxiety: Secondary | ICD-10-CM

## 2022-03-22 DIAGNOSIS — R338 Other retention of urine: Secondary | ICD-10-CM

## 2022-03-22 DIAGNOSIS — E7800 Pure hypercholesterolemia, unspecified: Secondary | ICD-10-CM

## 2022-03-22 NOTE — Progress Notes (Signed)
Location:   SNF [place] Room Number: 30 Place of Service:  SNF (31) Provider: [facility] Majorie Santee NP  Virgie Dad, MD  Patient Care Team: Virgie Dad, MD as PCP - General (Internal Medicine) Francina Ames, MD as Referring Physician (Otolaryngology) Eppie Gibson, MD as Attending Physician (Radiation Oncology) Leota Sauers, RN (Inactive) as Oncology Nurse Navigator Lenn Cal, DDS (Inactive) as Consulting Physician (Dentistry) Tat, Eustace Quail, DO as Consulting Physician (Neurology) Alla Feeling, NP as Nurse Practitioner (Nurse Practitioner)  Extended Emergency Contact Information Primary Emergency Contact: Conlee,[name] Address: [address]          [place], [place] [address] Johnnette Litter of [place] Phone: [phone] Mobile Phone: [phone] Relation: Spouse  Code Status:  DNR Goals of care: Advanced Directive information    03/24/2022    9:30 AM  Advanced Directives  Does Patient Have a Medical Advance Directive? Yes  Type of Paramedic of [place];Living will  Does patient want to make changes to medical advance directive? No - Patient declined  Copy of [place] in Chart? Yes - validated most recent copy scanned in chart (See row information)     Chief Complaint  Patient presents with   Acute Visit    agitation    HPI:  Pt is a 76 y.o. male seen today for an acute visit for persisted agitation, IM Haldol was effective. Last 7 day prn Haldol ordered expired yesterday.   Depression, psychotic features, takes Mirtazapine, Trintellix, Lorazepam Na 143 03/09/22  Dementia, his geriatric psych is Dr. 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 60 Gy over 30 fractions.    Hypertension    Hypogonadism male    Prostate cancer ([place]) 06/07/11   gleason 3+3=6, vol 59.5 cc   Sleep apnea    Status post chemotherapy    testicular cancer 1992, Dr Beryle Beams   Past Surgical History:  Procedure Laterality Date   APPENDECTOMY     COLONOSCOPY  08/07/2005   [facility]   DIRECT LARYNGOSCOPY N/A 08/19/2018   Procedure: DIRECT LARYNGOSCOPY;  Surgeon: Leta Baptist, MD;  Location: [place];  Service: ENT;  Laterality: N/A;   INGUINAL HERNIA REPAIR     w/orchiectomy 1992, retroperitoneal lymph node dissection   IR GASTROSTOMY TUBE REMOVAL  02/28/2019   MENISECTOMY     R&L knee surgeries   PROSTATE SURGERY     biopsy x 2   SHX1  06/13/2021   right inferior mid medial external ear antehelix shave   shx1 Left 11/21/2021   epidermoid cyst inflamed and disrupted   SKIN BIOPSY Right 01/09/2022  chondrodermatitis nodularis helicis , 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 If you have any questions, ask your nurse or doctor.          brexpiprazole 2 MG Tabs tablet Commonly known as: Rexulti Take 1 tablet (2  mg total) by mouth daily.   carboxymethylcellulose 0.5 % Soln Commonly known as: REFRESH PLUS 1 drop 3 (three) times daily as needed. One drop each eye 2-3 times daily   cholecalciferol 25 MCG (1000 UNIT) tablet Commonly known as: VITAMIN D3 Take 1,000 Units by mouth daily.   divalproex 125 MG capsule Commonly known as: DEPAKOTE SPRINKLE Take 3 capsules (375 mg total) by mouth 2 (two) times daily. 4  bid   donepezil 10 MG tablet Commonly known as: ARICEPT TAKE 1 TABLET(10 MG) BY MOUTH AT BEDTIME   famotidine 10 MG tablet Commonly known as: PEPCID Take 10 mg by mouth in the morning.   finasteride 5 MG tablet Commonly known as: PROSCAR TAKE 1 TABLET BY MOUTH EVERY DAY   haloperidol lactate 5 MG/ML injection Commonly known as: HALDOL 0.5 mg daily as needed. PRN x 7 days. Give if aggressive, hitting, yelling towards staff   ipratropium 0.06 % nasal spray Commonly known as: ATROVENT USE 2 SPRAYS IEN BID PRN FOR DRAINAGE   levothyroxine 75 MCG tablet Commonly known as: SYNTHROID TAKE 1 TABLET(75 MCG) BY MOUTH DAILY   LORazepam 0.5 MG tablet Commonly known as: ATIVAN Take 0.5 mg by mouth daily. Give at 5 pm with dinner   LORazepam 0.5 MG tablet Commonly known as: ATIVAN Take 1 tablet (0.5 mg total) by mouth every 6 (six) hours as needed for anxiety.   LORazepam 1 MG tablet Commonly known as: Ativan Take 1 tablet (1 mg total) by mouth at bedtime as needed for up to 14 days for anxiety.   memantine 10 MG tablet Commonly known as: NAMENDA Take 10 mg by mouth 2 (two) times daily.  Give crushed with breakfast/supper food   mirtazapine 30 MG disintegrating tablet Commonly known as: REMERON SOL-TAB DISSOLVE 1 TABLET(30 MG) ON THE TONGUE AT BEDTIME   polyethylene glycol powder 17 GM/SCOOP powder Commonly known as: GLYCOLAX/MIRALAX Take 17 g by mouth daily.   primidone 50 MG tablet Commonly known as: MYSOLINE Take 1 tablet (50 mg total) by mouth 3 (three) times  daily.   rosuvastatin 20 MG tablet Commonly known as: CRESTOR TAKE 1 TABLET(20 MG) BY MOUTH DAILY   sodium fluoride 1.1 % Crea dental cream Commonly known as: PreviDent 5000 Plus Apply cream to tooth brush. Brush teeth for 2 minutes. Spit out excess. DO NOT rinse afterwards. Repeat nightly.   terazosin 2 MG capsule Commonly known as: HYTRIN TAKE 1 CAPSULE(2 MG) BY MOUTH DAILY   vortioxetine HBr 20 MG Tabs tablet Commonly known as: Trintellix Take 1 tablet (20 mg total) by mouth daily.   zinc oxide 20 % ointment Apply 1 Application topically as needed for irritation.        Review of Systems  Unable to perform ROS: Dementia    Immunization History  Administered Date(s) Administered   Influenza Split 04/18/2012,  05/06/2013, 05/27/2015   Influenza Whole 04/12/2011   Influenza, High Dose Seasonal PF 05/21/2017, 05/16/2018, 04/11/2019, 05/09/2020   Influenza-Unspecified 04/23/2014, 05/18/2016, 05/21/2017, 05/16/2018, 05/09/2020, 04/19/2021   PFIZER Comirnaty(Gray Top)Covid-19 Tri-Sucrose Vaccine 11/16/2020   PFIZER(Purple Top)SARS-COV-2 Vaccination 09/19/2019, 10/03/2019, 04/06/2020   Pneumococcal Conjugate-13 08/31/2015   Pneumococcal Polysaccharide-23 12/09/2009   Tdap 01/17/2000, 07/25/2012   Zoster Recombinat (Shingrix) 05/04/2021, 11/01/2021   Zoster, Live 12/08/2008   Pertinent  Health Maintenance Due  Topic Date Due   INFLUENZA VACCINE  03/07/2022      01/28/2021    2:37 PM 08/12/2021    2:55 PM 11/02/2021    2:24 PM 11/23/2021    2:25 PM 02/09/2022    3:21 PM  Fall Risk  Falls in the past year?  0 0 0 0  Was there an injury with Fall?  0 0 0 0  Fall Risk Category Calculator  0 0 0 0  Fall Risk Category  Low Low Low Low  Patient Fall Risk Level Low fall risk Low fall risk Low fall risk Low fall risk Moderate fall risk  Patient at Risk for Falls Due to   No Fall Risks No Fall Risks   Fall risk Follow up   Falls evaluation completed Falls evaluation completed     Functional Status Survey:    Vitals:   03/22/22 1155  BP: 134/74  Pulse: 84  Resp: 18  Temp: (!) 96.9 F (36.1 C)  SpO2: 97%   There is no height or weight on file to calculate BMI. Physical Exam Vitals and nursing note reviewed.  Constitutional:      Appearance: Normal appearance.  HENT:     Head: Normocephalic and atraumatic.     Nose: Nose normal. No congestion.     Mouth/Throat:     Mouth: Mucous membranes are moist.  Eyes:     Extraocular Movements: Extraocular movements intact.     Conjunctiva/sclera: Conjunctivae normal.     Pupils: Pupils are equal, round, and reactive to light.  Cardiovascular:     Rate and Rhythm: Normal rate and regular rhythm.     Heart sounds: No murmur heard. Pulmonary:     Effort: Pulmonary effort is normal.     Breath sounds: No rales.  Abdominal:     General: Bowel sounds are normal.     Palpations: Abdomen is soft.     Tenderness: There is no abdominal tenderness.  Musculoskeletal:     Cervical back: Normal range of motion and neck supple.  Skin:    General: Skin is warm and dry.  Neurological:     General: No focal deficit present.     Mental Status: He is alert. Mental status is at baseline.     Gait: Gait abnormal.     Comments: Oriented to self  Psychiatric:     Comments: Emotional/behavioral outbursts.      Labs reviewed: Recent Labs    11/23/21 1526 01/19/22 0734 03/09/22 0000  NA 140 143 143  K 4.4 4.3 3.6  CL 103 104 104  CO2 25 22 30*  GLUCOSE 97 87  --   BUN 15 12 37*  CREATININE 0.89 0.90 1.0  CALCIUM 9.5 9.2 9.6   Recent Labs    11/23/21 1526 01/19/22 0734 03/09/22 0000  AST 20 31 44*  ALT 21 36 37  ALKPHOS 68 54 52  BILITOT 0.4 0.4  --   PROT 6.4 6.3  --   ALBUMIN 4.6 4.4 4.4   Recent  Labs    11/23/21 1526 01/19/22 0734 03/09/22 0000  WBC 3.1* 2.5* 3.5  NEUTROABS 2.1 1.3*  --   HGB 13.5 15.1 13.8  HCT 40.3 42.1 40*  MCV 92 91  --   PLT 176 169 155   Lab Results  Component  Value Date   TSH 3.110 11/23/2021   No results found for: "HGBA1C" Lab Results  Component Value Date   CHOL 110 11/23/2021   HDL 49 11/23/2021   LDLCALC 43 11/23/2021   TRIG 93 11/23/2021   CHOLHDL 2.2 11/23/2021    Significant Diagnostic Results in last 30 days:  No results found.  Assessment/Plan Depression, psychotic ([place]) persisted agitation, IM Haldol was effective. Last 7 day prn Haldol ordered expired yesterday.  Depression, psychotic features, takes Mirtazapine, Trintellix, Lorazepam Na 143 03/09/22 Will continue Haldol 0.'5mg'$  q12hrs IM prn x 7 days.   Alzheimer's dementia [facility]) his geriatric psych is Dr. Roland Earl, TSH 3.11 11/23/21, takes Resulti, Depakote, Donepezil, Memantine  Slow transit constipation Stable, takes MiraLax  GERD (gastroesophageal reflux disease) Stable, takes Famotidine  Other specified hypothyroidism takes Levothyroxine, TSH 2.804 08/11/21  Gait abnormality uses walker, frequent falls  Hyperlipidemia LDL 50 4/23, on Crestor  Benign prostatic hyperplasia with urinary retention takes Finasteride, Terazosin    Family/ staff Communication: plan of care reviewed with the patient and charge nurse.   Labs/tests ordered:  none  Time spend 25 minutes.

## 2022-03-23 ENCOUNTER — Encounter: Payer: Self-pay | Admitting: Nurse Practitioner

## 2022-03-23 DIAGNOSIS — R269 Unspecified abnormalities of gait and mobility: Secondary | ICD-10-CM | POA: Insufficient documentation

## 2022-03-23 DIAGNOSIS — K5901 Slow transit constipation: Secondary | ICD-10-CM | POA: Insufficient documentation

## 2022-03-23 NOTE — Assessment & Plan Note (Signed)
LDL 50 4/23, on Crestor

## 2022-03-23 NOTE — Assessment & Plan Note (Signed)
takes Levothyroxine, TSH 2.804 08/11/21

## 2022-03-23 NOTE — Assessment & Plan Note (Signed)
his geriatric psych is Dr. Roland Earl, TSH 3.11 11/23/21, takes Resulti, Depakote, Donepezil, Memantine

## 2022-03-23 NOTE — Assessment & Plan Note (Signed)
Stable, takes MiraLax  

## 2022-03-23 NOTE — Assessment & Plan Note (Signed)
takes Finasteride, Terazosin

## 2022-03-23 NOTE — Assessment & Plan Note (Signed)
persisted agitation, IM Haldol was effective. Last 7 day prn Haldol ordered expired yesterday.  Depression, psychotic features, takes Mirtazapine, Trintellix, Lorazepam Na 143 03/09/22 Will continue Haldol 0.'5mg'$  q12hrs IM prn x 7 days.

## 2022-03-23 NOTE — Assessment & Plan Note (Signed)
Stable,  takes Famotidine ?

## 2022-03-23 NOTE — Assessment & Plan Note (Signed)
uses walker, frequent falls

## 2022-03-24 ENCOUNTER — Encounter: Payer: Self-pay | Admitting: Emergency Medicine

## 2022-03-24 ENCOUNTER — Emergency Department
Admission: EM | Admit: 2022-03-24 | Discharge: 2022-03-26 | Disposition: A | Discharge status: Skilled Nursing Facility | Payer: Medicare Other | Attending: Emergency Medicine | Admitting: Emergency Medicine

## 2022-03-24 ENCOUNTER — Other Ambulatory Visit: Payer: Self-pay

## 2022-03-24 ENCOUNTER — Telehealth (HOSPITAL_BASED_OUTPATIENT_CLINIC_OR_DEPARTMENT_OTHER): Payer: Medicare Other | Admitting: Psychiatry

## 2022-03-24 ENCOUNTER — Non-Acute Institutional Stay (SKILLED_NURSING_FACILITY): Payer: Medicare Other | Admitting: Orthopedic Surgery

## 2022-03-24 ENCOUNTER — Encounter: Payer: Self-pay | Admitting: Orthopedic Surgery

## 2022-03-24 DIAGNOSIS — I10 Essential (primary) hypertension: Secondary | ICD-10-CM | POA: Diagnosis not present

## 2022-03-24 DIAGNOSIS — F332 Major depressive disorder, recurrent severe without psychotic features: Secondary | ICD-10-CM | POA: Diagnosis not present

## 2022-03-24 DIAGNOSIS — F02A0 Dementia in other diseases classified elsewhere, mild, without behavioral disturbance, psychotic disturbance, mood disturbance, and anxiety: Secondary | ICD-10-CM

## 2022-03-24 DIAGNOSIS — R456 Violent behavior: Secondary | ICD-10-CM | POA: Diagnosis present

## 2022-03-24 DIAGNOSIS — E038 Other specified hypothyroidism: Secondary | ICD-10-CM | POA: Diagnosis not present

## 2022-03-24 DIAGNOSIS — G309 Alzheimer's disease, unspecified: Secondary | ICD-10-CM | POA: Diagnosis present

## 2022-03-24 DIAGNOSIS — F323 Major depressive disorder, single episode, severe with psychotic features: Secondary | ICD-10-CM

## 2022-03-24 DIAGNOSIS — Z20822 Contact with and (suspected) exposure to covid-19: Secondary | ICD-10-CM | POA: Insufficient documentation

## 2022-03-24 DIAGNOSIS — R4689 Other symptoms and signs involving appearance and behavior: Secondary | ICD-10-CM

## 2022-03-24 DIAGNOSIS — F02811 Dementia in other diseases classified elsewhere, unspecified severity, with agitation: Secondary | ICD-10-CM | POA: Insufficient documentation

## 2022-03-24 DIAGNOSIS — F02818 Dementia in other diseases classified elsewhere, unspecified severity, with other behavioral disturbance: Secondary | ICD-10-CM | POA: Diagnosis not present

## 2022-03-24 DIAGNOSIS — Z79899 Other long term (current) drug therapy: Secondary | ICD-10-CM | POA: Diagnosis not present

## 2022-03-24 DIAGNOSIS — R634 Abnormal weight loss: Secondary | ICD-10-CM | POA: Diagnosis not present

## 2022-03-24 LAB — COMPREHENSIVE METABOLIC PANEL
ALT: 42 U/L (ref 0–44)
AST: 48 U/L — ABNORMAL HIGH (ref 15–41)
Albumin: 4 g/dL (ref 3.5–5.0)
Alkaline Phosphatase: 73 U/L (ref 38–126)
Anion gap: 9 (ref 5–15)
BUN: 25 mg/dL — ABNORMAL HIGH (ref 8–23)
CO2: 27 mmol/L (ref 22–32)
Calcium: 9.2 mg/dL (ref 8.9–10.3)
Chloride: 111 mmol/L (ref 98–111)
Creatinine, Ser: 0.71 mg/dL (ref 0.61–1.24)
GFR, Estimated: >60 mL/min (ref >60)
Glucose, Bld: 93 mg/dL (ref 70–99)
Potassium: 4.6 mmol/L (ref 3.5–5.1)
Sodium: 147 mmol/L — ABNORMAL HIGH (ref 135–145)
Total Bilirubin: 0.6 mg/dL (ref 0.3–1.2)
Total Protein: 6.4 g/dL — ABNORMAL LOW (ref 6.5–8.1)

## 2022-03-24 LAB — CBC
HCT: 42.2 % (ref 39.0–52.0)
Hemoglobin: 13.9 g/dL (ref 13.0–17.0)
MCH: 32.3 pg (ref 26.0–34.0)
MCHC: 32.9 g/dL (ref 30.0–36.0)
MCV: 97.9 fL (ref 80.0–100.0)
Platelets: 173 10*3/uL (ref 150–400)
RBC: 4.31 MIL/uL (ref 4.22–5.81)
RDW: 14.3 % (ref 11.5–15.5)
WBC: 3.2 10*3/uL — ABNORMAL LOW (ref 4.0–10.5)
nRBC: 0 % (ref 0.0–0.2)

## 2022-03-24 LAB — SARS CORONAVIRUS 2 BY RT PCR: SARS Coronavirus 2 by RT PCR: NEGATIVE

## 2022-03-24 LAB — ACETAMINOPHEN LEVEL: Acetaminophen (Tylenol), Serum: <10 ug/mL — ABNORMAL LOW (ref 10–30)

## 2022-03-24 LAB — ETHANOL: Alcohol, Ethyl (B): <10 mg/dL (ref <10)

## 2022-03-24 LAB — SALICYLATE LEVEL: Salicylate Lvl: <7 mg/dL — ABNORMAL LOW (ref 7.0–30.0)

## 2022-03-24 LAB — VALPROIC ACID LEVEL: Valproic Acid Lvl: 79 ug/mL (ref 50.0–100.0)

## 2022-03-24 MED ORDER — BREXPIPRAZOLE 1 MG PO TABS
1.0000 mg | ORAL_TABLET | Freq: Every day | ORAL | Status: DC
Start: 2022-03-25 — End: 2022-03-25
  Administered 2022-03-25: 1 mg via ORAL
  Filled 2022-03-24: qty 1

## 2022-03-24 MED ORDER — LORAZEPAM 0.5 MG PO TABS
0.5000 mg | ORAL_TABLET | Freq: Every day | ORAL | Status: DC
  Administered 2022-03-25 – 2022-03-26 (×2): 0.5 mg via ORAL
  Filled 2022-03-24 (×2): qty 1

## 2022-03-24 MED ORDER — MIRTAZAPINE 15 MG PO TBDP: 30.0000 mg | ORAL_TABLET | Freq: Every day | ORAL | Status: DC

## 2022-03-24 MED ORDER — DIVALPROEX SODIUM 125 MG PO CSDR
500.0000 mg | DELAYED_RELEASE_CAPSULE | Freq: Two times a day (BID) | ORAL | Status: DC
Start: 2022-03-24 — End: 2022-03-26
  Administered 2022-03-24 – 2022-03-26 (×4): 500 mg via ORAL
  Filled 2022-03-24 (×4): qty 4

## 2022-03-24 MED ORDER — OLANZAPINE 10 MG IM SOLR
2.5000 mg | Freq: Two times a day (BID) | INTRAMUSCULAR | Status: DC | PRN
  Filled 2022-03-24: qty 10

## 2022-03-24 MED ORDER — OLANZAPINE 5 MG PO TABS
2.5000 mg | ORAL_TABLET | Freq: Two times a day (BID) | ORAL | Status: DC | PRN
  Administered 2022-03-26: 2.5 mg via ORAL
  Filled 2022-03-24: qty 1

## 2022-03-24 MED ORDER — LORAZEPAM 1 MG PO TABS
1.0000 mg | ORAL_TABLET | Freq: Four times a day (QID) | ORAL | Status: DC | PRN
  Administered 2022-03-24: 1 mg via ORAL
  Filled 2022-03-24: qty 1

## 2022-03-24 MED ORDER — MEMANTINE HCL 5 MG PO TABS
10.0000 mg | ORAL_TABLET | Freq: Two times a day (BID) | ORAL | Status: DC
  Administered 2022-03-24 – 2022-03-26 (×4): 10 mg via ORAL
  Filled 2022-03-24 (×4): qty 2

## 2022-03-24 MED ORDER — DIVALPROEX SODIUM 125 MG PO CSDR: 375.0000 mg | DELAYED_RELEASE_CAPSULE | Freq: Two times a day (BID) | ORAL | Status: DC

## 2022-03-24 MED ORDER — MIRTAZAPINE 15 MG PO TBDP
15.0000 mg | ORAL_TABLET | Freq: Every day | ORAL | Status: DC
Start: 2022-03-24 — End: 2022-03-26
  Administered 2022-03-24 – 2022-03-25 (×2): 15 mg via ORAL
  Filled 2022-03-24 (×2): qty 1

## 2022-03-24 MED ORDER — DONEPEZIL HCL 5 MG PO TABS
10.0000 mg | ORAL_TABLET | Freq: Every day | ORAL | Status: DC
  Administered 2022-03-24 – 2022-03-25 (×2): 10 mg via ORAL
  Filled 2022-03-24 (×2): qty 2

## 2022-03-24 MED ORDER — VORTIOXETINE HBR 5 MG PO TABS
20.0000 mg | ORAL_TABLET | Freq: Every day | ORAL | Status: DC
  Administered 2022-03-24 – 2022-03-26 (×3): 20 mg via ORAL
  Filled 2022-03-24 (×3): qty 4

## 2022-03-24 MED ORDER — OLANZAPINE 5 MG PO TABS
2.5000 mg | ORAL_TABLET | Freq: Every day | ORAL | Status: DC
  Administered 2022-03-24: 2.5 mg via ORAL
  Filled 2022-03-24: qty 1

## 2022-03-24 MED ORDER — FAMOTIDINE 20 MG PO TABS
10.0000 mg | ORAL_TABLET | Freq: Every morning | ORAL | Status: DC
  Administered 2022-03-25 – 2022-03-26 (×2): 10 mg via ORAL
  Filled 2022-03-24 (×2): qty 1

## 2022-03-24 MED ORDER — TERAZOSIN HCL 2 MG PO CAPS
2.0000 mg | ORAL_CAPSULE | Freq: Every day | ORAL | Status: DC
  Administered 2022-03-25: 2 mg via ORAL
  Filled 2022-03-24: qty 1

## 2022-03-24 MED ORDER — LEVOTHYROXINE SODIUM 50 MCG PO TABS
75.0000 ug | ORAL_TABLET | Freq: Every day | ORAL | Status: DC
  Administered 2022-03-26: 75 ug via ORAL
  Filled 2022-03-24 (×2): qty 2

## 2022-03-24 MED ORDER — ROSUVASTATIN CALCIUM 20 MG PO TABS
20.0000 mg | ORAL_TABLET | Freq: Every day | ORAL | Status: DC
  Administered 2022-03-24 – 2022-03-26 (×3): 20 mg via ORAL
  Filled 2022-03-24 (×3): qty 1

## 2022-03-24 MED ORDER — PRIMIDONE 50 MG PO TABS
50.0000 mg | ORAL_TABLET | Freq: Three times a day (TID) | ORAL | Status: DC
  Administered 2022-03-24 – 2022-03-26 (×5): 50 mg via ORAL
  Filled 2022-03-24 (×6): qty 1

## 2022-03-24 MED ORDER — BREXPIPRAZOLE 2 MG PO TABS: 2.0000 mg | ORAL_TABLET | Freq: Every day | ORAL | Status: DC

## 2022-03-24 NOTE — ED Provider Notes (Signed)
[facility] Provider Note    Event Date/Time   First MD Initiated Contact with Patient 03/24/22 1357     (approximate)   History   Chief Complaint Psychiatric Evaluation and Aggressive Behavior   HPI  [name] is a 76 y.o. male with past medical history of hypertension, hyperlipidemia, Alzheimer's dementia, and tremor who presents to the ED for psychiatric evaluation.  History is limited due to patient's advanced dementia, he states he is not sure why he is here.  Per EMS, patient has been increasingly agitated and aggressive at his nursing facility, [facility].  He has apparently been hitting and spitting at staff, but is currently calm and cooperative.  He denies any complaints, specifically denies any fever, cough, chest pain, shortness of breath, dysuria, or hematuria.     Physical Exam   Triage Vital Signs: ED Triage Vitals [03/24/22 1409]  Enc Vitals Group     BP 114/65     Pulse Rate (!) 115     Resp 18     Temp 98.1 F (36.7 C)     Temp Source Oral     SpO2 98 %     Weight      Height      Head Circumference      Peak Flow      Pain Score      Pain Loc      Pain Edu?      Excl. in [place]?     Most recent vital signs: Vitals:   03/24/22 1409  BP: 114/65  Pulse: (!) 115  Resp: 18  Temp: 98.1 F (36.7 C)  SpO2: 98%    Constitutional: Alert and oriented to person, but not place, time, or situation. Eyes: Conjunctivae are normal. Head: Atraumatic. Nose: No congestion/rhinnorhea. Mouth/Throat: Mucous membranes are moist.  Cardiovascular: Normal rate, regular rhythm. Grossly normal heart sounds.  2+ radial pulses bilaterally. Respiratory: Normal respiratory effort.  No retractions. Lungs CTAB. Gastrointestinal: Soft and nontender. No distention. Musculoskeletal: No lower extremity tenderness nor edema.  Neurologic:  Normal speech and language. No gross focal neurologic deficits are appreciated.    ED Results /  Procedures / Treatments   Labs (all labs ordered are listed, but only abnormal results are displayed) Labs Reviewed  COMPREHENSIVE METABOLIC PANEL - Abnormal; Notable for the following components:      Result Value   Sodium 147 (*)    BUN 25 (*)    Total Protein 6.4 (*)    AST 48 (*)    All other components within normal limits  SALICYLATE LEVEL - Abnormal; Notable for the following components:   Salicylate Lvl <4.6 (*)    All other components within normal limits  ACETAMINOPHEN LEVEL - Abnormal; Notable for the following components:   Acetaminophen (Tylenol), Serum <10 (*)    All other components within normal limits  CBC - Abnormal; Notable for the following components:   WBC 3.2 (*)    All other components within normal limits  SARS CORONAVIRUS 2 BY RT PCR  ETHANOL  URINE DRUG SCREEN, QUALITATIVE ([facility] ONLY)    PROCEDURES:  Critical Care performed: No  Procedures   MEDICATIONS ORDERED IN ED: Medications  brexpiprazole (REXULTI) tablet 2 mg (has no administration in time range)  divalproex (DEPAKOTE SPRINKLE) capsule 375 mg (has no administration in time range)  donepezil (ARICEPT) tablet 10 mg (has no administration in time range)  famotidine (PEPCID) tablet 10 mg (has no administration  in time range)  levothyroxine (SYNTHROID) tablet 75 mcg (has no administration in time range)  LORazepam (ATIVAN) tablet 0.5 mg (has no administration in time range)  memantine (NAMENDA) tablet 10 mg (has no administration in time range)  mirtazapine (REMERON SOL-TAB) disintegrating tablet 30 mg (has no administration in time range)  primidone (MYSOLINE) tablet 50 mg (has no administration in time range)  rosuvastatin (CRESTOR) tablet 20 mg (has no administration in time range)  terazosin (HYTRIN) capsule 2 mg (has no administration in time range)  vortioxetine HBr (TRINTELLIX) tablet 20 mg (has no administration in time range)     IMPRESSION / MDM / ASSESSMENT AND PLAN / ED COURSE   I reviewed the triage vital signs and the nursing notes.                              76 y.o. male with past medical history of hypertension, hyperlipidemia, Alzheimer's dementia, and tremor who presents to the ED for aggressive behavior at his nursing facility.  Patient's presentation is most consistent with acute presentation with potential threat to life or bodily function.  Differential diagnosis includes, but is not limited to, worsening dementia, psychosis, anxiety, depression, electrolyte abnormality, UTI.  Patient nontoxic-appearing and in no acute distress, neurologic exam is nonfocal and he appears to be at his baseline mental status.  He denies any medical complaints and screening labs are unremarkable with no significant anemia, leukocytosis, shot abnormality, or AKI.  Urinalysis is pending but low suspicion for UTI and patient may be medically cleared for psychiatric disposition.  Tylenol and salicylate levels are undetectable.  Psychiatric evaluation is pending at this time.  The patient has been placed in psychiatric observation due to the need to provide a safe environment for the patient while obtaining psychiatric consultation and evaluation, as well as ongoing medical and medication management to treat the patient's condition.  The patient has not been placed under full IVC at this time.      FINAL CLINICAL IMPRESSION(S) / ED DIAGNOSES   Final diagnoses:  Alzheimer's dementia with agitation, unspecified dementia severity, unspecified timing of dementia onset ([place])  Aggressive behavior     Rx / DC Orders   ED Discharge Orders     None        Note:  This document was prepared using Dragon voice recognition software and may include unintentional dictation errors.   Blake Divine, MD 03/24/22 1539

## 2022-03-24 NOTE — ED Notes (Signed)
Pt using foul language at staff. Flailing arms when RN trying to interact with patient.

## 2022-03-24 NOTE — ED Notes (Signed)
Fall monitoring on and functioning, yellow socks, and visible from nurses station.

## 2022-03-24 NOTE — Progress Notes (Signed)
Location:   [facility] Room Number: 30 A Place of Service:  SNF [phone]) Provider:  Windell Moulding, NP  Virgie Dad, MD  Patient Care Team: Virgie Dad, MD as PCP - General (Internal Medicine) Francina Ames, MD as Referring Physician (Otolaryngology) Eppie Gibson, MD as Attending Physician (Radiation Oncology) Leota Sauers, RN (Inactive) as Oncology Nurse Navigator Lenn Cal, DDS (Inactive) as Consulting Physician (Dentistry) Tat, Eustace Quail, DO as Consulting Physician (Neurology) Alla Feeling, NP as Nurse Practitioner (Nurse Practitioner)  Extended Emergency Contact Information Primary Emergency Contact: Piacente,[name] Address: [address]          [place], [place] [address] Johnnette Litter of [place] Phone: [phone] Mobile Phone: [phone] Relation: Spouse  Code Status:  FULL CODE Goals of care: Advanced Directive information    03/24/2022    9:30 AM  Advanced Directives  Does Patient Have a Medical Advance Directive? Yes  Type of Paramedic of [place];Living will  Does patient want to make changes to medical advance directive? No - Patient declined  Copy of [place] in Chart? Yes - validated most recent copy scanned in chart (See row information)     Chief Complaint  Patient presents with   Acute Visit    Agitation    HPI:  Pt is a 76 y.o. male seen today for an acute visit for ongoing agitation.   08/01 admitted to skilled nursing at [facility] due to increased agitation, psychosis and dementia. Also followed by Dr. Casimiro Needle. Since admission, his agitation has worsened. Increased behaviors typically begin in early evening, in the past 2 days they have started around 2 pm. He has been observed wandering halls, urinating in hallways, entering other residents rooms without permission, hitting/swinging/spitting at staff, using strong offensive language towards staff.  Compliant with AM medications, known to spit them out with afternoon and evening administration. Wife tried to have sitter services with him without success. He is unable to be redirected most of the time. He does respond to male caregivers better than male. Since admission he has had multiple falls, no apparent injury besides bruising or skin tears. Ambulates with wheelchair/ walker with assist. Weight has trended down. 136 lbs on admission, now 106 lbs. He is currently taking scheduled/prn ativan, Depakote, Aricept, Namenda, Rexulti, Remeron, and Trintellix. He has been given Haldol 0.5 mg IM prn daily x 7 days due to increased agitation. Behaviors improved with Haldol.   Discussed behaviors with wife and Dr. Casimiro Needle via telephone today. He recommends admission to [facility] inpatient psych unit. Wife agrees with plan of care.   Valproic acid level 79 03/09/2022.   TSH 12.28 03/23/2022, on levothyroxine but not taking consistently.      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 60 Gy over 30 fractions.    Hypertension    Hypogonadism male    Prostate cancer ([place]) 06/07/11   gleason 3+3=6, vol 59.5 cc   Sleep apnea  Status post chemotherapy    testicular cancer 1992, Dr Beryle Beams   Past Surgical History:  Procedure Laterality Date   APPENDECTOMY     COLONOSCOPY  08/07/2005   gessner   DIRECT LARYNGOSCOPY N/A 08/19/2018   Procedure: DIRECT LARYNGOSCOPY;  Surgeon: Leta Baptist, MD;  Location: [place];  Service: ENT;  Laterality: N/A;   INGUINAL HERNIA REPAIR     w/orchiectomy 1992, retroperitoneal  lymph node dissection   IR GASTROSTOMY TUBE REMOVAL  02/28/2019   MENISECTOMY     R&L knee surgeries   PROSTATE SURGERY     biopsy x 2   [facility]  06/13/2021   right inferior mid medial external ear antehelix shave   shx1 Left 11/21/2021   epidermoid cyst inflamed and disrupted   SKIN BIOPSY Right 01/09/2022   chondrodermatitis nodularis helicis , 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 If you have any questions, ask your nurse or doctor.          STOP taking these medications    finasteride 5 MG tablet Commonly known as: PROSCAR Stopped by: Yvonna Alanis, NP       TAKE these medications    acetaminophen 500 MG tablet Commonly known as: TYLENOL Take 500 mg by mouth 2 (two) times daily.   acetaminophen 500 MG tablet Commonly known as: TYLENOL Take 500 mg by mouth daily as needed.   brexpiprazole 2 MG Tabs tablet Commonly known as: Rexulti Take 1 tablet (2 mg total) by mouth daily.   carboxymethylcellulose 0.5 % Soln Commonly known as: REFRESH PLUS 1 drop 3 (three) times daily as needed. One drop each eye 2-3 times daily   cholecalciferol 25 MCG (1000 UNIT) tablet Commonly known as: VITAMIN D3 Take 1,000 Units by mouth daily.   divalproex 125 MG capsule Commonly known as: DEPAKOTE SPRINKLE Take 375 mg by mouth 2 (two) times daily. Three capsules. Morning dose open capsule and mix with breakfast. Evening dose open capsule and mix with supper meal. What changed: Another medication with the same name was removed. Continue taking this medication, and follow the directions you see here. Changed by: Yvonna Alanis, NP   donepezil 10 MG tablet Commonly known as: ARICEPT TAKE  1 TABLET(10 MG) BY MOUTH AT BEDTIME   famotidine 10 MG tablet Commonly known as: PEPCID Take 10 mg by mouth in the morning.   haloperidol lactate 5 MG/ML injection Commonly known as: HALDOL 0.5 mg daily as needed. PRN x 7 days. Give if aggressive, hitting, yelling towards staff   ipratropium 0.06 % nasal spray Commonly known as: ATROVENT USE 2 SPRAYS IEN BID PRN FOR DRAINAGE   levothyroxine 75 MCG tablet Commonly known as: SYNTHROID TAKE 1 TABLET(75 MCG) BY MOUTH DAILY   LORazepam 0.5 MG tablet Commonly known as: ATIVAN Take 0.5 mg by mouth daily. Give at 5 pm with dinner   LORazepam 0.5 MG tablet Commonly known as: ATIVAN Take 1 tablet (0.5 mg total) by mouth every 6 (six) hours as needed for anxiety.   LORazepam 1 MG tablet Commonly known as: Ativan Take 1 tablet (1 mg  total) by mouth at bedtime as needed for up to 14 days for anxiety.   memantine 10 MG tablet Commonly known as: NAMENDA Take 10 mg by mouth 2 (two) times daily.  Give crushed with breakfast/supper food   mirtazapine 30 MG disintegrating tablet Commonly known as: REMERON SOL-TAB DISSOLVE 1 TABLET(30 MG) ON THE TONGUE AT BEDTIME   polyethylene glycol powder 17 GM/SCOOP powder Commonly known as: GLYCOLAX/MIRALAX Take 17 g by mouth daily.   primidone 50 MG tablet Commonly known as: MYSOLINE Take 1 tablet (50 mg total) by mouth 3 (three) times daily.   rosuvastatin 20 MG tablet Commonly known as: CRESTOR TAKE 1 TABLET(20 MG) BY MOUTH DAILY   sodium fluoride 1.1 % Crea dental cream Commonly known as: PreviDent 5000 Plus Apply cream to tooth brush. Brush teeth for 2 minutes. Spit out excess. DO NOT rinse afterwards. Repeat nightly.   terazosin 2 MG capsule Commonly known as: HYTRIN TAKE 1 CAPSULE(2 MG) BY MOUTH DAILY   vortioxetine HBr 20 MG Tabs tablet Commonly known as: Trintellix Take 1 tablet (20 mg total) by mouth daily.   zinc oxide 20 % ointment Apply 1 Application topically as needed  for irritation.        Review of Systems  Unable to perform ROS: Dementia    Immunization History  Administered Date(s) Administered   Influenza Split 04/18/2012, 05/06/2013, 05/27/2015   Influenza Whole 04/12/2011   Influenza, High Dose Seasonal PF 05/21/2017, 05/16/2018, 04/11/2019, 05/09/2020   Influenza-Unspecified 04/23/2014, 05/18/2016, 05/21/2017, 05/16/2018, 05/09/2020, 04/19/2021   PFIZER Comirnaty(Gray Top)Covid-19 Tri-Sucrose Vaccine 11/16/2020   PFIZER(Purple Top)SARS-COV-2 Vaccination 09/19/2019, 10/03/2019, 04/06/2020   Pneumococcal Conjugate-13 08/31/2015   Pneumococcal Polysaccharide-23 12/09/2009   Tdap 01/17/2000, 07/25/2012   Zoster Recombinat (Shingrix) 05/04/2021, 11/01/2021   Zoster, Live 12/08/2008   Pertinent  Health Maintenance Due  Topic Date Due   INFLUENZA VACCINE  03/07/2022      01/28/2021    2:37 PM 08/12/2021    2:55 PM 11/02/2021    2:24 PM 11/23/2021    2:25 PM 02/09/2022    3:21 PM  Fall Risk  Falls in the past year?  0 0 0 0  Was there an injury with Fall?  0 0 0 0  Fall Risk Category Calculator  0 0 0 0  Fall Risk Category  Low Low Low Low  Patient Fall Risk Level Low fall risk Low fall risk Low fall risk Low fall risk Moderate fall risk  Patient at Risk for Falls Due to   No Fall Risks No Fall Risks   Fall risk Follow up   Falls evaluation completed Falls evaluation completed    Functional Status Survey:    Vitals:   03/24/22 0925  BP: 134/74  Pulse: 84  Resp: 20  Temp: (!) 96.9 F (36.1 C)  SpO2: 98%  Weight: 136 lb (61.7 kg)  Height: '5\' 6"'$  (1.676 m)   Body mass index is 21.95 kg/m. Physical Exam Vitals reviewed.  Constitutional:      Appearance: He is underweight.  Eyes:     General:        Right eye: No discharge.        Left eye: No discharge.  Cardiovascular:     Rate and Rhythm: Normal rate and regular rhythm.     Pulses: Normal pulses.     Heart sounds: Normal heart sounds.  Pulmonary:     Effort: Pulmonary  effort is normal. No respiratory distress.     Breath sounds:  Normal breath sounds. No wheezing.  Abdominal:     General: Bowel sounds are normal. There is no distension.     Palpations: Abdomen is soft.     Tenderness: There is no abdominal tenderness.  Musculoskeletal:     Cervical back: Neck supple.     Right lower leg: No edema.     Left lower leg: No edema.  Skin:    General: Skin is warm and dry.     Capillary Refill: Capillary refill takes less than 2 seconds.     Comments: Multiple skin tears/bruising to extremities, CDI  Neurological:     General: No focal deficit present.     Mental Status: He is easily aroused. Mental status is at baseline.     Motor: Weakness present.     Gait: Gait abnormal.     Comments: Walker/wheelchair  Psychiatric:        Mood and Affect: Mood normal.        Behavior: Behavior normal.     Comments: Does not follow commands, aphasia     Labs reviewed: Recent Labs    11/23/21 1526 01/19/22 0734 03/09/22 0000  NA 140 143 143  K 4.4 4.3 3.6  CL 103 104 104  CO2 25 22 30*  GLUCOSE 97 87  --   BUN 15 12 37*  CREATININE 0.89 0.90 1.0  CALCIUM 9.5 9.2 9.6   Recent Labs    11/23/21 1526 01/19/22 0734 03/09/22 0000  AST 20 31 44*  ALT 21 36 37  ALKPHOS 68 54 52  BILITOT 0.4 0.4  --   PROT 6.4 6.3  --   ALBUMIN 4.6 4.4 4.4   Recent Labs    11/23/21 1526 01/19/22 0734 03/09/22 0000  WBC 3.1* 2.5* 3.5  NEUTROABS 2.1 1.3*  --   HGB 13.5 15.1 13.8  HCT 40.3 42.1 40*  MCV 92 91  --   PLT 176 169 155   Lab Results  Component Value Date   TSH 3.110 11/23/2021   No results found for: "HGBA1C" Lab Results  Component Value Date   CHOL 110 11/23/2021   HDL 49 11/23/2021   LDLCALC 43 11/23/2021   TRIG 93 11/23/2021   CHOLHDL 2.2 11/23/2021    Significant Diagnostic Results in last 30 days:  No results found.  Assessment/Plan 1. 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  Casimiro Needle- recommends inpatient psych admission - send to [facility] ED for medical clearance pending  inpatient psych admission - accepting physician Dr. Caren Griffins  - cont ativan, Depakote, Aricept, Namenda, Rexulti, Remeron, and Trintellix  2. Mild Alzheimer's dementia, unspecified timing of dementia onset, unspecified whether behavioral, psychotic, or mood disturbance or anxiety ([place]) - followed by neurology - see above - cont Aricept and Namenda  3. Other specified hypothyroidism - TSH 12.28 03/23/2022 - poor medication compliance due to psychosis - cont levothyroxine   4. Weight loss - admitting weight 136 lbs, now 106 lbs - see above - cont monthly weights - cont Boost TID   Family/ staff Communication: plan discussed with patient, wife, and nurse  Labs/tests ordered:  none

## 2022-03-24 NOTE — ED Notes (Signed)
Pt arrives with healing bruise noted to right cheek area, bandaid to right cheekbone.

## 2022-03-24 NOTE — ED Notes (Signed)
Assisted pt with eating dinner, pt ate 25% of dinner.

## 2022-03-24 NOTE — ED Notes (Signed)
Dressed out by this Therapist, sports and EDT Terrence Dupont. Pt belongings include socks, pants and shirt.

## 2022-03-24 NOTE — ED Notes (Addendum)
First nurse note:  Pt brought in by [facility] from [facility]  in [place]. EMS states worsening dementia and states facility wants a psych eval.

## 2022-03-24 NOTE — ED Notes (Signed)
VOL/pending Geri-psych admit

## 2022-03-24 NOTE — ED Triage Notes (Signed)
Pt to ED via [facility] from [facility] in [place] for evaluation and geri psych admission. Pt has hx/o dementia and has had increased aggressive behaviors such as hitting and spitting at staff. Pt is currently calm and cooperative.
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
## 2022-03-24 NOTE — ED Notes (Signed)
Pt appears to be having active hallucinations
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
## 2022-03-25 DIAGNOSIS — R4689 Other symptoms and signs involving appearance and behavior: Secondary | ICD-10-CM

## 2022-03-25 MED ORDER — BREXPIPRAZOLE 0.25 MG PO TABS
0.5000 mg | ORAL_TABLET | Freq: Every day | ORAL | Status: DC
Start: 2022-03-26 — End: 2022-03-26
  Administered 2022-03-26: 0.5 mg via ORAL
  Filled 2022-03-25: qty 2

## 2022-03-25 MED ORDER — OLANZAPINE 5 MG PO TABS
2.5000 mg | ORAL_TABLET | Freq: Two times a day (BID) | ORAL | Status: DC
  Administered 2022-03-25 – 2022-03-26 (×2): 2.5 mg via ORAL
  Filled 2022-03-25 (×2): qty 1

## 2022-03-25 NOTE — ED Notes (Signed)
Pt kicking and punching out while staff changing his linens and briefs. Pt's pants changed out as well as adding a gown instead of a scrub top. Pt repositioned. Pt educated entire time but continued with aggression. Security, this RN, Faith NT and Physiological scientist completed task.

## 2022-03-25 NOTE — ED Notes (Signed)
Patient is vol pending Geri-psych admit

## 2022-03-25 NOTE — ED Notes (Signed)
Pt was agreeable to take med crushed up in applesauce though pt remains generally uncooperative.

## 2022-03-25 NOTE — BH Assessment (Signed)
Psych team spoke with the patient to complete an updated/reassessment. Pt was unable to answer questions appropriately and was not oriented. Per Burman Blacksmith, NP, due to pt's Alzheimer's dx pt does not meet inpatient criteria as pt has a baseline of mentation disturbance.   Psych team spoke with Alford,[name] (Spouse) [phone] to update her about pt's plan of care. Wife verbalized an understanding of the plan.     Psych team attempted to contact [place] [phone] ; however there was no answer. A HIPPA compliant voicemail was left.

## 2022-03-25 NOTE — ED Notes (Signed)
Pt's linens soaked right around his waist. Once enough staff members available to help safely provide peri care and change scrubs and linens will change pt out.

## 2022-03-25 NOTE — ED Notes (Signed)
Attempted to assist pt with lunch,Pt refused and threw half of sandwich down the hall. Rn notified

## 2022-03-25 NOTE — ED Notes (Signed)
Faith NT attempted to assist pt with lunch tray; pt refused and threw half of his sandwich down the hallway.

## 2022-03-25 NOTE — ED Notes (Signed)
VOL/pending reassessment

## 2022-03-25 NOTE — ED Notes (Signed)
Per MD Louis Meckel, pt dispo is set to d/c to original facility [facility] in [place].  This RN left a HIPPA compliant message with Luetta Nutting, Resident [facility] Coordinator. Originally transferred by security from number [phone] to nurse's station with no answer and no VM system.  Dialed [phone] and left VM in Ms. Maryjean Morn inbox.
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
## 2022-03-25 NOTE — ED Notes (Signed)
Pts dinner and drink at bedside pt currently sleeping.
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
## 2022-03-25 NOTE — ED Provider Notes (Signed)
Emergency Medicine Observation Re-evaluation Note  [name] is a 76 y.o. male, seen on rounds today.  Pt initially presented to the ED for complaints of Psychiatric Evaluation and Aggressive Behavior Currently, the patient is resting, voices no medical complaints.  Physical Exam  BP 114/65   Pulse (!) 115   Temp 98.1 F (36.7 C) (Oral)   Resp 18   SpO2 98%  Physical Exam General: Resting in no acute distress Cardiac: No cyanosis Lungs: Equal rise and fall Psych: Not agitated  ED Course / MDM  EKG:   I have reviewed the labs performed to date as well as medications administered while in observation.  Recent changes in the last 24 hours include no events overnight.  Plan  Current plan is for psychiatric disposition for geropsych placement.  [name] is not under involuntary commitment.     Paulette Blanch, MD 03/25/22 [phone]

## 2022-03-26 MED ORDER — MIRTAZAPINE 15 MG PO TBDP: 15.0000 mg | ORAL_TABLET | Freq: Every day | ORAL | 0 refills | Status: DC

## 2022-03-26 MED ORDER — DIVALPROEX SODIUM 125 MG PO CSDR: 500.0000 mg | DELAYED_RELEASE_CAPSULE | Freq: Two times a day (BID) | ORAL | 0 refills | Status: DC

## 2022-03-26 MED ORDER — OLANZAPINE 2.5 MG PO TABS: 2.5000 mg | ORAL_TABLET | Freq: Two times a day (BID) | ORAL | 0 refills | Status: DC

## 2022-03-26 NOTE — ED Notes (Addendum)
Breakfast tray given at this time. Pt needs assistance with feeding.

## 2022-03-26 NOTE — ED Notes (Signed)
Vol / pending discharge back to his current facility waiting for a return call from care coordinator

## 2022-03-26 NOTE — ED Notes (Addendum)
Pt was fed half a cup of apple sauce by Probation officer.
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
## 2022-03-26 NOTE — Progress Notes (Signed)
Care Coordinator at [facility], client's SNF, phone number is [phone]. Two messages left that he is discharged and needs to coordinate return there.  When this is decided, his wife will need to be notified.  Waylan Boga, PMHNP

## 2022-03-26 NOTE — ED Notes (Addendum)
Pt displayed increased agitation and called out multiple times.  This RN fed pt entire cup of ice cream. Pt calm and cooperative at this time.

## 2022-03-26 NOTE — TOC Initial Note (Signed)
Transition of Care [facility]) - Initial/Assessment Note    Patient Details  Name: [name] MRN: [id number] Date of Birth: 1946/06/23  Transition of Care [facility]) CM/SW Contact:    Magnus Ivan, LCSW Phone Number: 03/26/2022, 10:30 AM  Clinical Narrative:                 Patient ready to DC back to [place] to patient's wife via phone who confirmed he is from [facility] SNF and is aware of DC today. CSW attempted calls to multiple numbers and was transferred multiple times. TOC did reach the Nurse on patient's unit at [facility] [place].  Monisha confirmed patient can come back today. He is in room N30. RN to call report to [phone] and arrange EMS transport to [place].  CSW faxed AVS to [facility] at [phone].  CSW left VM for Helene Kelp as listed in Psych note to notify her of DC and requested she return CSW's call if she needs anything additional.   Expected Discharge Plan: Skilled Nursing Facility Barriers to Discharge: Barriers Resolved   Patient Goals and CMS Choice Patient states their goals for this hospitalization and ongoing recovery are:: return to [facility] SNF CMS Medicare.gov Compare Post Acute Care list provided to:: Patient Represenative (must comment) Choice offered to / list presented to : Spouse  Expected Discharge Plan and Services Expected Discharge Plan: [place] arrangements for the past 2 months: [place]                                      Prior Living Arrangements/Services Living arrangements for the past 2 months: [place] Lives with:: Facility Resident Patient language and need for interpreter reviewed:: Yes Do you feel safe going back to the place where you live?: Yes      Need for Family Participation in Patient Care: Yes (Comment) Care giver support system in place?: Yes (comment)   Criminal Activity/Legal  Involvement Pertinent to Current Situation/Hospitalization: No - Comment as needed  Activities of Daily Living      Permission Sought/Granted Permission sought to share information with : Facility Art therapist granted to share information with : Yes, Verbal Permission Granted (by spouse)     Permission granted to share info w AGENCY: [facility]        Emotional Assessment         Alcohol / Substance Use: Not Applicable Psych Involvement: No (comment)  Admission diagnosis:  psych eval-ems Patient Active Problem List   Diagnosis Date Noted   Aggressive behavior    Slow transit constipation 03/23/2022   Gait abnormality 03/23/2022   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 [place] [address] Phone: [phone] Fax: [phone]     Social Determinants of Health (SDOH) Interventions    Readmission Risk Interventions     No data to  display

## 2022-03-26 NOTE — ED Notes (Signed)
Pt placed in low behavioral bed from stretcher.  Clean brief applied to pt and clean gown dry blankets.  Pt back in 19H with no other needs at this time
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
## 2022-03-27 ENCOUNTER — Non-Acute Institutional Stay (SKILLED_NURSING_FACILITY): Payer: Medicare Other | Admitting: Orthopedic Surgery

## 2022-03-27 ENCOUNTER — Encounter: Payer: Self-pay | Admitting: Orthopedic Surgery

## 2022-03-27 DIAGNOSIS — G309 Alzheimer's disease, unspecified: Secondary | ICD-10-CM

## 2022-03-27 DIAGNOSIS — E038 Other specified hypothyroidism: Secondary | ICD-10-CM | POA: Diagnosis not present

## 2022-03-27 DIAGNOSIS — F02A0 Dementia in other diseases classified elsewhere, mild, without behavioral disturbance, psychotic disturbance, mood disturbance, and anxiety: Secondary | ICD-10-CM

## 2022-03-27 DIAGNOSIS — R634 Abnormal weight loss: Secondary | ICD-10-CM

## 2022-03-27 DIAGNOSIS — F323 Major depressive disorder, single episode, severe with psychotic features: Secondary | ICD-10-CM

## 2022-03-27 NOTE — Progress Notes (Signed)
Location:  [place] Room Number: 30-A Place of Service:  SNF (31) Provider: Windell Moulding, NP  Code Status: FULL CODE Goals of Care:     03/27/2022   10:03 AM  Advanced Directives  Does Patient Have a Medical Advance Directive? Yes  Type of Paramedic of [place];Living will  Does patient want to make changes to medical advance directive? No - Patient declined  Copy of [place] in Chart? Yes - validated most recent copy scanned in chart (See row information)     Chief Complaint  Patient presents with   Acute Visit    agitation    HPI: Patient is a 76 y.o. male seen today for acute visit due to increased agitation.   He currently resides on the skilled nursing unit at [facility] due to dementia. Evaluated by Dr. Casimiro Needle via telephone 08/18 due to increased agitation, psychosis, and worsening dementia. Inpatient geropsych admission recommended. He was transferred to [facility] ED 08/18 for medical clearance. WBC 3.2, Na+ 147, K+ 4.6, BUN/creat 25/0.71, AST/ALT 48/42, valproic acid 79, acetaminophen level <67, salicylate <2.0, Ethyl < 10 , covid negative. During his stay he was observed by nursing staff to spit medications, throw food, kick and punch during ADLs. 08/19 psych evaluation revealed he did not meet inpatient criteria due to his inability to ambulate and diagnosis of dementia. His depakote was increased to 500 mg BID and Remeron decreased to 15 mg qhs. Remains on Zyprexa 2.5 mg BID, Trintellix, Brexpiprazole and ativan. He was given Zyprexa 2.5 mg IM BID prn for increased agitation, advised to continue at discharge. Advised to follow up with provider to assess and evaluate symptoms, progress with treatment. Discharged back to [facility] 08/20.   Today, he is yelling strong offensive language to staff during ADLs. He is unable to answer simple questions or follow commands. He was able to take oral  medications with applesauce. Wife and nursing coordinator are trying to schedule follow up appointment.         Past Medical History:  Diagnosis Date   Allergy    Alzheimer disease ([place]) 06/2018   diagnosed with early stage alzheimers   Arthritis    osteoarthritis   Cancer ([place])    testicular, age 18, orchiectomy 1992   Cataract    B/L CATARACTS NO SURGERY   Dementia ([place])    Depression    Dyslipidemia    Heartburn    TAKES ZANTAC   History of radiation therapy 12/02/18- 01/14/19   Head and neck/ base of tongue. 60 Gy over 30 fractions.    Hypertension    Hypogonadism male    Prostate cancer ([place]) 06/07/11   gleason 3+3=6, vol 59.5 cc   Sleep apnea    Status post chemotherapy    testicular cancer 1992, Dr Beryle Beams    Past Surgical History:  Procedure Laterality Date   APPENDECTOMY     COLONOSCOPY  08/07/2005   gessner   DIRECT LARYNGOSCOPY N/A 08/19/2018   Procedure: DIRECT LARYNGOSCOPY;  Surgeon: Leta Baptist, MD;  Location: [place];  Service: ENT;  Laterality: N/A;   INGUINAL HERNIA REPAIR     w/orchiectomy 1992, retroperitoneal lymph node dissection   IR GASTROSTOMY TUBE REMOVAL  02/28/2019   MENISECTOMY     R&L knee surgeries   PROSTATE SURGERY     biopsy x 2   [facility]  06/13/2021   right inferior mid medial external ear  antehelix shave   shx1 Left 11/21/2021   epidermoid cyst inflamed and disrupted   SKIN BIOPSY Right 01/09/2022   chondrodermatitis nodularis helicis , 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   acetaminophen (TYLENOL) 500 MG tablet Take 500 mg by mouth daily as needed.   brexpiprazole (REXULTI) 2 MG  TABS tablet Take 1 tablet (2 mg total) by mouth daily.   carboxymethylcellulose (REFRESH PLUS) 0.5 % SOLN 1 drop 3 (three) times daily as needed. One drop each eye 2-3 times daily   cholecalciferol (VITAMIN D3) 25 MCG (1000 UNIT) tablet Take 1,000 Units by mouth daily.   divalproex (DEPAKOTE SPRINKLES) 125 MG capsule Take 4 capsules (500 mg total) by mouth 2 (two) times daily.   donepezil (ARICEPT) 10 MG tablet TAKE 1 TABLET(10 MG) BY MOUTH AT BEDTIME   famotidine (PEPCID) 10 MG tablet Take 10 mg by mouth in the morning.   ipratropium (ATROVENT) 0.06 % nasal spray    levothyroxine (SYNTHROID) 75 MCG tablet TAKE 1 TABLET(75 MCG) BY MOUTH DAILY   LORazepam (ATIVAN) 0.5 MG tablet Take 0.5 mg by mouth daily. Give at 5 pm with dinner   LORazepam (ATIVAN) 0.5 MG tablet Take 1 tablet (0.5 mg total) by mouth every 6 (six) hours as needed for anxiety.   memantine (NAMENDA) 10 MG tablet Take 10 mg by mouth 2 (two) times daily.  Give crushed with breakfast/supper food   mirtazapine (REMERON SOL-TAB) 15 MG disintegrating tablet Take 1 tablet (15 mg total) by mouth at bedtime.   OLANZapine (ZYPREXA) 2.5 MG tablet Take 1 tablet (2.5 mg total) by mouth 2 (two) times daily.   polyethylene glycol powder (GLYCOLAX/MIRALAX) powder Take 17 g by mouth daily.   primidone (MYSOLINE) 50 MG tablet Take 1 tablet (50 mg total) by mouth 3 (three) times daily.   rosuvastatin (CRESTOR) 20 MG tablet TAKE 1 TABLET(20 MG) BY MOUTH DAILY   sodium fluoride (PREVIDENT 5000 PLUS) 1.1 % CREA dental cream Apply cream to tooth brush. Brush teeth for 2 minutes. Spit out excess. DO NOT rinse afterwards. Repeat nightly.   terazosin (HYTRIN) 2 MG capsule TAKE 1 CAPSULE(2 MG) BY MOUTH DAILY   vortioxetine HBr (TRINTELLIX) 20 MG TABS tablet Take 1 tablet (20 mg total) by mouth daily.   zinc oxide 20 % ointment Apply 1 Application topically as needed for irritation.   [DISCONTINUED] haloperidol lactate (HALDOL) 5 MG/ML injection 0.5 mg daily  as needed. PRN x 7 days. Give if aggressive, hitting, yelling towards staff   No facility-administered encounter medications on file as of 03/27/2022.    Review of Systems:  Review of Systems  Unable to perform ROS: Dementia    Health Maintenance  Topic Date Due   INFLUENZA VACCINE  03/07/2022   COVID-19 Vaccine (5 - Pfizer risk series) 05/01/2022 (Originally 01/11/2021)   Pneumonia Vaccine 43+ Years old (3 - PPSV23 or PCV20) 12/05/2022 (Originally 08/30/2016)   TETANUS/TDAP  07/25/2022   Fecal DNA (Cologuard)  11/12/2022   Hepatitis C Screening  Completed   Zoster Vaccines- Shingrix  Completed   HPV VACCINES  Aged Out    Physical Exam: Vitals:  03/27/22 0958  BP: 134/74  Pulse: 84  Resp: 20  Temp: (!) 96.9 F (36.1 C)  SpO2: 98%  Weight: 136 lb (61.7 kg)  Height: '5\' 6"'$  (1.676 m)   Body mass index is 21.95 kg/m. Physical Exam Vitals reviewed.  Constitutional:      General: He is not in acute distress. HENT:     Head: Normocephalic.  Eyes:     General:        Right eye: No discharge.        Left eye: No discharge.     Pupils: Pupils are equal, round, and reactive to light.  Cardiovascular:     Rate and Rhythm: Normal rate and regular rhythm.     Pulses: Normal pulses.     Heart sounds: Normal heart sounds.  Pulmonary:     Effort: Pulmonary effort is normal. No respiratory distress.     Breath sounds: Normal breath sounds. No wheezing.  Abdominal:     General: Bowel sounds are normal. There is no distension.     Palpations: Abdomen is soft.     Tenderness: There is no abdominal tenderness.  Musculoskeletal:     Cervical back: Neck supple.     Right lower leg: No edema.     Left lower leg: No edema.  Skin:    General: Skin is warm and dry.     Capillary Refill: Capillary refill takes less than 2 seconds.     Comments: Scattered bruising/scabbbing to extremities, vary in size, no sign of infection  Neurological:     General: No focal deficit present.      Mental Status: He is alert. Mental status is at baseline.     Motor: Weakness present.     Gait: Gait abnormal.     Comments: Wheelchair/walker  Psychiatric:        Mood and Affect: Mood normal.        Behavior: Behavior normal.     Labs reviewed: Basic Metabolic Panel: Recent Labs    08/11/21 1317 08/30/21 1647 11/23/21 1526 01/19/22 0734 03/09/22 0000 03/24/22 1409  NA  --    < > 140 143 143 147*  K  --    < > 4.4 4.3 3.6 4.6  CL  --    < > 103 104 104 111  CO2  --    < > 25 22 30* 27  GLUCOSE  --   --  97 87  --  93  BUN  --    < > 15 12 37* 25*  CREATININE  --    < > 0.89 0.90 1.0 0.71  CALCIUM  --    < > 9.5 9.2 9.6 9.2  TSH 2.804  --  3.110  --   --   --    < > = values in this interval not displayed.   Liver Function Tests: Recent Labs    11/23/21 1526 01/19/22 0734 03/09/22 0000 03/24/22 1409  AST 20 31 44* 48*  ALT 21 36 37 42  ALKPHOS 68 54 52 73  BILITOT 0.4 0.4  --  0.6  PROT 6.4 6.3  --  6.4*  ALBUMIN 4.6 4.4 4.4 4.0   No results for input(s): "LIPASE", "AMYLASE" in the last 8760 hours. No results for input(s): "AMMONIA" in the last 8760 hours. CBC: Recent Labs    11/23/21 1526 01/19/22 0734 03/09/22 0000 03/24/22 1409  WBC 3.1* 2.5* 3.5 3.2*  NEUTROABS 2.1 1.3*  --   --  HGB 13.5 15.1 13.8 13.9  HCT 40.3 42.1 40* 42.2  MCV 92 91  --  97.9  PLT 176 169 155 173   Lipid Panel: Recent Labs    11/23/21 1526  CHOL 110  HDL 49  LDLCALC 43  TRIG 93  CHOLHDL 2.2   No results found for: "HGBA1C"  Procedures since last visit: No results found.  Assessment/Plan 1. 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 Casimiro Needle- geropsych inpatient admission  - [place] ED 08/18-08/20- sent for medical clearance pending psych admission - 08/19 psych consult- did not meet inpatient criteria due to ambulation/dementia - Depakote increased to 500 mg BID - Remeron reduced to 15 mg qhs - Zyprexa 2.5 mg po BID - Zyprexa 2.5 mg IM BID prn  for aggressive behaviors - cont ativan, Aricept, Namenda, Rexulti and Trintellix  2. Mild Alzheimer's dementia, unspecified timing of dementia onset, unspecified whether behavioral, psychotic, or mood disturbance or anxiety ([place]) - followed by neurology - see above - cont Aricept and Namenda  3. Other specified hypothyroidism - - TSH 12.28 03/23/2022 - poor medication compliance due to psychosis - cont levothyroxine   4. Weight loss - admitting weight 136 lbs, now 106 lbs - see above - cont monthly weights - cont Boost TID    Labs/tests ordered:  none  Total time: 35 minutes

## 2022-03-28 ENCOUNTER — Non-Acute Institutional Stay (SKILLED_NURSING_FACILITY): Payer: Medicare Other | Admitting: Adult Health

## 2022-03-28 ENCOUNTER — Encounter: Payer: Self-pay | Admitting: Adult Health

## 2022-03-28 DIAGNOSIS — F5101 Primary insomnia: Secondary | ICD-10-CM

## 2022-03-28 DIAGNOSIS — F419 Anxiety disorder, unspecified: Secondary | ICD-10-CM

## 2022-03-28 DIAGNOSIS — G309 Alzheimer's disease, unspecified: Secondary | ICD-10-CM | POA: Diagnosis not present

## 2022-03-28 DIAGNOSIS — F323 Major depressive disorder, single episode, severe with psychotic features: Secondary | ICD-10-CM

## 2022-03-28 DIAGNOSIS — M25551 Pain in right hip: Secondary | ICD-10-CM

## 2022-03-28 DIAGNOSIS — F02818 Dementia in other diseases classified elsewhere, unspecified severity, with other behavioral disturbance: Secondary | ICD-10-CM

## 2022-03-28 NOTE — Progress Notes (Signed)
Location:  [place] Room Number: 30-A Place of Service:  SNF (31) Provider:  Durenda Age, DNP, FNP-BC  Patient Care Team: Virgie Dad, MD as PCP - General (Internal Medicine) Francina Ames, MD as Referring Physician (Otolaryngology) Eppie Gibson, MD as Attending Physician (Radiation Oncology) Leota Sauers, RN (Inactive) as Oncology Nurse Navigator Lenn Cal, DDS (Inactive) as Consulting Physician (Dentistry) Tat, Eustace Quail, DO as Consulting Physician (Neurology) Alla Feeling, NP as Nurse Practitioner (Nurse Practitioner)  Extended Emergency Contact Information Primary Emergency Contact: Bainter,[name] Address: [address]          [place], [place] [address] Johnnette Litter of [place] Phone: [phone] Mobile Phone: [phone] Relation: Spouse  Code Status Full Code    Goals of care: Advanced Directive information    03/27/2022   10:03 AM  Advanced Directives  Does Patient Have a Medical Advance Directive? Yes  Type of Paramedic of [place];Living will  Does patient want to make changes to medical advance directive? No - Patient declined  Copy of [place] in Chart? Yes - validated most recent copy scanned in chart (See row information)     Chief Complaint  Patient presents with   Acute Visit    Medication Review    HPI:  Pt is a 76 y.o. male seen today for medical management of chronic diseases.  He is a long-term care resident of [facility]. He was sent to ED on 03/24/22 for aggressive behavior and discharged back to SNF on 03/26/22. Depakote was increased to 500 mg BID, started on Zyprexa 2.'5mg'$  IM BID and decreased Remeron dosage from 30 mg Q hs to 15 mg Q hs. He continued to be on Rexulti 2 mg daily, Vortioxetin 20 mg daily, Ativan 0.5 mg daily and Ativan 0.5 mg Q 6 hours PRN.  He was seen in his room today and was seen sleeping. Staff reported that he  has not eaten breakfast and it was almost lunch but still he remains to be sleeping. He was reported to be aggressive yesterday.   He woke up at 1:30 PM, he started yelling. Whenever his right leg is being moved, he yells. No edema was noted on right hip or knee. Wife, charge nurse,and CNA at bedside.   Past Medical History:  Diagnosis Date   Allergy    Alzheimer disease ([place]) 06/2018   diagnosed with early stage alzheimers   Arthritis    osteoarthritis   Cancer ([place])    testicular, age 31, orchiectomy 1992   Cataract    B/L CATARACTS NO SURGERY   Dementia ([place])    Depression    Dyslipidemia    Heartburn    TAKES ZANTAC   History of radiation therapy 12/02/18- 01/14/19   Head and neck/ base of tongue. 60 Gy over 30 fractions.    Hypertension    Hypogonadism male    Prostate cancer ([place]) 06/07/11   gleason 3+3=6, vol 59.5 cc   Sleep apnea    Status post chemotherapy    testicular cancer 1992, Dr Beryle Beams   Past Surgical History:  Procedure Laterality Date   APPENDECTOMY     COLONOSCOPY  08/07/2005   gessner   DIRECT LARYNGOSCOPY N/A 08/19/2018   Procedure: DIRECT LARYNGOSCOPY;  Surgeon: Leta Baptist, MD;  Location: [place];  Service: ENT;  Laterality: N/A;   INGUINAL HERNIA REPAIR     w/orchiectomy 1992, retroperitoneal lymph node dissection  IR GASTROSTOMY TUBE REMOVAL  02/28/2019   MENISECTOMY     R&L knee surgeries   PROSTATE SURGERY     biopsy x 2   SHX1  06/13/2021   right inferior mid medial external ear antehelix shave   shx1 Left 11/21/2021   epidermoid cyst inflamed and disrupted   SKIN BIOPSY Right 01/09/2022   chondrodermatitis nodularis helicis , 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   acetaminophen (TYLENOL) 500 MG tablet Take 500 mg by mouth daily as needed.   brexpiprazole (REXULTI) 2 MG TABS tablet Take 1 tablet (2 mg total) by mouth daily.   carboxymethylcellulose (REFRESH PLUS) 0.5 % SOLN 1 drop 3 (three) times daily as needed. One drop each eye 2-3 times daily   cholecalciferol (VITAMIN D3) 25 MCG (1000 UNIT) tablet Take 1,000 Units by mouth daily.   divalproex (DEPAKOTE SPRINKLES) 125 MG capsule Take 4 capsules (500 mg total) by mouth 2 (two) times daily.   donepezil (ARICEPT) 10 MG tablet TAKE 1 TABLET(10 MG) BY MOUTH AT BEDTIME   famotidine (PEPCID) 10 MG tablet Take 10 mg by mouth in the morning.   ipratropium (ATROVENT) 0.06 % nasal spray    levothyroxine (SYNTHROID) 75 MCG tablet TAKE 1 TABLET(75 MCG) BY MOUTH DAILY   LORazepam (ATIVAN) 0.5 MG tablet Take 0.5 mg by mouth daily. Give at 5 pm with dinner   LORazepam (ATIVAN) 0.5 MG tablet Take 1 tablet (0.5 mg total) by mouth every 6 (six) hours as needed for anxiety.   memantine (NAMENDA) 10 MG tablet Take 10 mg by mouth 2 (two) times daily.  Give crushed with breakfast/supper food   mirtazapine (REMERON SOL-TAB) 15 MG disintegrating tablet Take 1 tablet (15 mg total) by mouth at bedtime.   OLANZapine (ZYPREXA) 2.5 MG tablet Take 1 tablet (2.5 mg total) by mouth 2 (two) times daily.   polyethylene glycol powder (GLYCOLAX/MIRALAX) powder Take 17 g by mouth daily.   primidone (MYSOLINE) 50 MG tablet Take 1 tablet (50 mg total) by mouth 3 (three) times daily.   rosuvastatin (CRESTOR) 20 MG tablet TAKE 1 TABLET(20 MG) BY MOUTH DAILY   sodium fluoride (PREVIDENT 5000 PLUS) 1.1 % CREA dental cream Apply cream to tooth brush. Brush teeth for 2 minutes. Spit out excess. DO NOT rinse afterwards. Repeat nightly.   terazosin (HYTRIN) 2 MG capsule TAKE 1 CAPSULE(2 MG) BY MOUTH DAILY   vortioxetine HBr (TRINTELLIX) 20 MG TABS tablet Take 1 tablet  (20 mg total) by mouth daily.   zinc oxide 20 % ointment Apply 1 Application topically as needed for irritation.   No facility-administered encounter medications on file as of 03/28/2022.    Review of Systems  Unable to obtain due to dementia.    Immunization History  Administered Date(s) Administered   Influenza Split 04/18/2012, 05/06/2013, 05/27/2015   Influenza Whole 04/12/2011   Influenza, High Dose Seasonal PF 05/21/2017, 05/16/2018, 04/11/2019, 05/09/2020   Influenza-Unspecified 04/23/2014, 05/18/2016, 05/21/2017, 05/16/2018, 05/09/2020, 04/19/2021   PFIZER Comirnaty(Gray Top)Covid-19 Tri-Sucrose Vaccine 11/16/2020   PFIZER(Purple Top)SARS-COV-2 Vaccination 09/19/2019, 10/03/2019, 04/06/2020   Pneumococcal Conjugate-13 08/31/2015   Pneumococcal Polysaccharide-23 12/09/2009   Tdap 01/17/2000,  07/25/2012   Zoster Recombinat (Shingrix) 05/04/2021, 11/01/2021   Zoster, Live 12/08/2008   Pertinent  Health Maintenance Due  Topic Date Due   INFLUENZA VACCINE  03/07/2022      03/24/2022    1:45 PM 03/24/2022    2:12 PM 03/25/2022   11:33 AM 03/25/2022   11:36 PM 03/26/2022    7:14 AM  Fall Risk  Patient Fall Risk Level High fall risk High fall risk Moderate fall risk High fall risk High fall risk     Vitals:   03/28/22 1319  BP: 134/74  Pulse: 84  Resp: 20  Temp: (!) 96.9 F (36.1 C)  Weight: 136 lb (61.7 kg)  Height: '5\' 6"'$  (1.676 m)   Body mass index is 21.95 kg/m.  Physical Exam Constitutional:      General: He is not in acute distress. HENT:     Head: Normocephalic and atraumatic.     Mouth/Throat:     Mouth: Mucous membranes are moist.  Eyes:     Conjunctiva/sclera: Conjunctivae normal.  Cardiovascular:     Rate and Rhythm: Normal rate and regular rhythm.     Pulses: Normal pulses.     Heart sounds: Normal heart sounds.  Pulmonary:     Effort: Pulmonary effort is normal.     Breath sounds: Normal breath sounds.  Abdominal:     General: Bowel sounds  are normal.     Palpations: Abdomen is soft.  Musculoskeletal:        General: No swelling.     Cervical back: Normal range of motion.     Comments: Right knee and right hip tenderness.  Skin:    General: Skin is warm and dry.  Neurological:     Mental Status: Mental status is at baseline. He is disoriented.  Psychiatric:     Comments: Occasionally yells out.        Labs reviewed: Recent Labs    11/23/21 1526 01/19/22 0734 03/09/22 0000 03/24/22 1409  NA 140 143 143 147*  K 4.4 4.3 3.6 4.6  CL 103 104 104 111  CO2 25 22 30* 27  GLUCOSE 97 87  --  93  BUN 15 12 37* 25*  CREATININE 0.89 0.90 1.0 0.71  CALCIUM 9.5 9.2 9.6 9.2   Recent Labs    11/23/21 1526 01/19/22 0734 03/09/22 0000 03/24/22 1409  AST 20 31 44* 48*  ALT 21 36 37 42  ALKPHOS 68 54 52 73  BILITOT 0.4 0.4  --  0.6  PROT 6.4 6.3  --  6.4*  ALBUMIN 4.6 4.4 4.4 4.0   Recent Labs    11/23/21 1526 01/19/22 0734 03/09/22 0000 03/24/22 1409  WBC 3.1* 2.5* 3.5 3.2*  NEUTROABS 2.1 1.3*  --   --   HGB 13.5 15.1 13.8 13.9  HCT 40.3 42.1 40* 42.2  MCV 92 91  --  97.9  PLT 176 169 155 173   Lab Results  Component Value Date   TSH 3.110 11/23/2021   No results found for: "HGBA1C" Lab Results  Component Value Date   CHOL 110 11/23/2021   HDL 49 11/23/2021   LDLCALC 43 11/23/2021   TRIG 93 11/23/2021   CHOLHDL 2.2 11/23/2021    Significant Diagnostic Results in last 30 days:  No results found.  Assessment/Plan  1. Pain of right hip -   no reported trauma -   will get x-ray done to  rule out fracture -   continue Acetaminophen 500  mg BID and PRN  2. 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 Anxiety -  will continue Ativan 0.5 mg daily and Ativan 0.5 mg Q 6 hours PRN  4. Alzheimer's dementia with behavioral disturbance ([place]) -  has severe cognitive impairment -   will continue Aricept 10 mg  daily and Namenda 10 mg BID  5.  Primary insomnia -  continue Remeron 15 mg Q HS     Family/ staff Communication: Discussed plan of care with wife and charge nurse  Labs/tests ordered: x-ray of right hip and knee    Durenda Age, DNP, MSN, FNP-BC [facility] [phone] (Monday-Friday 8:00 a.m. - 5:00 p.m.) [phone] (after hours)

## 2022-03-29 ENCOUNTER — Non-Acute Institutional Stay (SKILLED_NURSING_FACILITY): Payer: Medicare Other | Admitting: Orthopedic Surgery

## 2022-03-29 ENCOUNTER — Encounter: Payer: Self-pay | Admitting: Orthopedic Surgery

## 2022-03-29 DIAGNOSIS — M25551 Pain in right hip: Secondary | ICD-10-CM | POA: Diagnosis not present

## 2022-03-29 DIAGNOSIS — R300 Dysuria: Secondary | ICD-10-CM

## 2022-03-29 DIAGNOSIS — M25561 Pain in right knee: Secondary | ICD-10-CM | POA: Diagnosis not present

## 2022-03-29 DIAGNOSIS — F323 Major depressive disorder, single episode, severe with psychotic features: Secondary | ICD-10-CM | POA: Diagnosis not present

## 2022-03-29 DIAGNOSIS — G309 Alzheimer's disease, unspecified: Secondary | ICD-10-CM

## 2022-03-29 DIAGNOSIS — F02818 Dementia in other diseases classified elsewhere, unspecified severity, with other behavioral disturbance: Secondary | ICD-10-CM

## 2022-03-29 DIAGNOSIS — G8929 Other chronic pain: Secondary | ICD-10-CM

## 2022-03-29 NOTE — Progress Notes (Signed)
Location:  [facility] Room Number: 30 Place of Service:  SNF [phone]) Provider:  Windell Moulding, NP  Virgie Dad, MD  Patient Care Team: Virgie Dad, MD as PCP - General (Internal Medicine) Francina Ames, MD as Referring Physician (Otolaryngology) Eppie Gibson, MD as Attending Physician (Radiation Oncology) Leota Sauers, RN (Inactive) as Oncology Nurse Navigator Lenn Cal, DDS (Inactive) as Consulting Physician (Dentistry) Tat, Eustace Quail, DO as Consulting Physician (Neurology) Alla Feeling, NP as Nurse Practitioner (Nurse Practitioner)  Extended Emergency Contact Information Primary Emergency Contact: Stadel,[name] Address: [address]          [place], [place] [address] Johnnette Litter of [place] Phone: [phone] Mobile Phone: [phone] Relation: Spouse  Code Status:  Full Code Goals of care: Advanced Directive information    03/27/2022   10:03 AM  Advanced Directives  Does Patient Have a Medical Advance Directive? Yes  Type of Paramedic of [place];Living will  Does patient want to make changes to medical advance directive? No - Patient declined  Copy of [place] in Chart? Yes - validated most recent copy scanned in chart (See row information)     Chief Complaint  Patient presents with   Acute Visit    Complains of Right Knee Pain.     HPI:  Pt is a 76 y.o. male seen today for acute visit due to right knee/hip pain.   He was observed yelling 08/22 due to right knee/hip pain. X- ray right knee negative for acute fracture or dislocation, moderate degenerative changes with narrowing of medical joint compartment, unclear if tear of medial meniscus. X-ray right hip no acute fracture or dislocation, moderate degenerative changed noted. He is able to work with PT today, yells out in pain with certain exercises. He is given tylenol 500 mg BID and PRN for pain.   "He currently  resides on the skilled nursing unit at [facility] due to dementia. Evaluated by Dr. Casimiro Needle via telephone 08/18 due to increased agitation, psychosis, and worsening dementia. Inpatient geropsych admission recommended. He was transferred to [facility] ED 08/18 for medical clearance. WBC 3.2, Na+ 147, K+ 4.6, BUN/creat 25/0.71, AST/ALT 48/42, valproic acid 79, acetaminophen level <63, salicylate <8.7, Ethyl < 10 , covid negative. During his stay he was observed by nursing staff to spit medications, throw food, kick and punch during ADLs. 08/19 psych evaluation revealed he did not meet inpatient criteria due to his inability to ambulate and diagnosis of dementia. His depakote was increased to 500 mg BID and Remeron decreased to 15 mg qhs. Remains on Zyprexa 2.5 mg BID, Trintellix, Brexpiprazole and ativan. He was given Zyprexa 2.5 mg IM BID prn for increased agitation, advised to continue at discharge. Advised to follow up with provider to assess and evaluate symptoms, progress with treatment. Discharged back to [facility] 08/20. "  Dr. Casimiro Needle aware of failed admission. Plan to have telephone visit 08/30 with wife. 08/22 he slept most of day, he was observed yelling in the evening. He has not been swinging/spitting since ED visit, just yelling obscenities. He did c/o dysuria in AM. Afebrile. Vitals stable.    Past Medical History:  Diagnosis Date   Allergy    Alzheimer disease ([place]) 06/2018   diagnosed with early stage alzheimers   Arthritis    osteoarthritis   Cancer ([place])    testicular, age 28, orchiectomy 1992   Cataract    B/L CATARACTS NO  SURGERY   Dementia ([place])    Depression    Dyslipidemia    Heartburn    TAKES ZANTAC   History of radiation therapy 12/02/18- 01/14/19   Head and neck/ base of tongue. 60 Gy over 30 fractions.    Hypertension    Hypogonadism male    Prostate cancer ([place]) 06/07/11   gleason 3+3=6, vol 59.5 cc   Sleep apnea    Status post chemotherapy     testicular cancer 1992, Dr Beryle Beams   Past Surgical History:  Procedure Laterality Date   APPENDECTOMY     COLONOSCOPY  08/07/2005   [facility]ner   DIRECT LARYNGOSCOPY N/A 08/19/2018   Procedure: DIRECT LARYNGOSCOPY;  Surgeon: Leta Baptist, MD;  Location: [facility];  Service: ENT;  Laterality: N/A;   INGUINAL HERNIA REPAIR     w/orchiectomy 1992, retroperitoneal lymph node dissection   IR GASTROSTOMY TUBE REMOVAL  02/28/2019   MENISECTOMY     R&L knee surgeries   PROSTATE SURGERY     biopsy x 2   [facility]  06/13/2021   right inferior mid medial external ear antehelix shave   [id number] Left 11/21/2021   epidermoid cyst inflamed and disrupted   SKIN BIOPSY Right 01/09/2022   chondrodermatitis nodularis helicis , 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   acetaminophen (TYLENOL) 500 MG tablet Take 500 mg by mouth daily as needed.   brexpiprazole (REXULTI) 2 MG TABS tablet Take 1 tablet (2 mg total) by mouth daily.   carboxymethylcellulose (REFRESH PLUS) 0.5 % SOLN 1 drop 3 (three) times daily as needed. One drop each eye 2-3 times daily   cholecalciferol (VITAMIN D3) 25 MCG (1000 UNIT) tablet Take 1,000 Units by mouth daily.   diclofenac Sodium (VOLTAREN ARTHRITIS PAIN) 1 % GEL Apply 2 g topically 3 (three) times daily. To right knee   divalproex (DEPAKOTE SPRINKLES) 125 MG capsule Take 4 capsules (500 mg total) by mouth 2 (two) times daily.   donepezil (ARICEPT) 10 MG tablet TAKE 1 TABLET(10 MG) BY MOUTH AT BEDTIME   famotidine (PEPCID) 10 MG tablet Take 10 mg by mouth in the morning.   ipratropium (ATROVENT) 0.06 % nasal spray Place 2 sprays into both  nostrils 2 (two) times daily as needed.   lactose free nutrition (BOOST) LIQD Take 237 mLs by mouth 3 (three) times daily between meals.   levothyroxine (SYNTHROID) 75 MCG tablet TAKE 1 TABLET(75 MCG) BY MOUTH DAILY   lidocaine (HM LIDOCAINE PATCH) 4 % Place 1 patch onto the skin daily. To right hip   LORazepam (ATIVAN) 0.5 MG tablet Take 0.5 mg by mouth daily. Give at 5 pm with dinner   LORazepam (ATIVAN) 0.5 MG tablet Take 1 tablet (0.5 mg total) by mouth every 6 (six) hours as needed for anxiety.   memantine (NAMENDA) 10 MG tablet Take 10 mg by mouth 2 (two) times daily.  Give crushed with breakfast/supper food   mirtazapine (REMERON SOL-TAB) 15 MG disintegrating tablet Take 1 tablet (15 mg total) by mouth at bedtime.   OLANZapine (ZYPREXA) 2.5 MG tablet Take 1 tablet (2.5 mg total) by mouth 2 (two) times daily.  polyethylene glycol powder (GLYCOLAX/MIRALAX) powder Take 17 g by mouth daily.   primidone (MYSOLINE) 50 MG tablet Take 1 tablet (50 mg total) by mouth 3 (three) times daily.   rosuvastatin (CRESTOR) 20 MG tablet TAKE 1 TABLET(20 MG) BY MOUTH DAILY   sodium fluoride (PREVIDENT 5000 PLUS) 1.1 % CREA dental cream Apply cream to tooth brush. Brush teeth for 2 minutes. Spit out excess. DO NOT rinse afterwards. Repeat nightly.   terazosin (HYTRIN) 2 MG capsule TAKE 1 CAPSULE(2 MG) BY MOUTH DAILY   vortioxetine HBr (TRINTELLIX) 20 MG TABS tablet Take 1 tablet (20 mg total) by mouth daily.   zinc oxide 20 % ointment Apply 1 Application topically as needed for irritation.   No facility-administered encounter medications on file as of 03/29/2022.    Review of Systems  Unable to perform ROS: Dementia    Immunization History  Administered Date(s) Administered   Influenza Split 04/18/2012, 05/06/2013, 05/27/2015   Influenza Whole 04/12/2011   Influenza, High Dose Seasonal PF 05/21/2017, 05/16/2018, 04/11/2019, 05/09/2020   Influenza-Unspecified 04/23/2014, 05/18/2016, 05/21/2017,  05/16/2018, 05/09/2020, 04/19/2021   PFIZER Comirnaty(Gray Top)Covid-19 Tri-Sucrose Vaccine 11/16/2020   PFIZER(Purple Top)SARS-COV-2 Vaccination 09/19/2019, 10/03/2019, 04/06/2020   Pneumococcal Conjugate-13 08/31/2015   Pneumococcal Polysaccharide-23 12/09/2009   Tdap 01/17/2000, 07/25/2012   Zoster Recombinat (Shingrix) 05/04/2021, 11/01/2021   Zoster, Live 12/08/2008   Pertinent  Health Maintenance Due  Topic Date Due   INFLUENZA VACCINE  03/07/2022      03/24/2022    1:45 PM 03/24/2022    2:12 PM 03/25/2022   11:33 AM 03/25/2022   11:36 PM 03/26/2022    7:14 AM  Fall Risk  Patient Fall Risk Level High fall risk High fall risk Moderate fall risk High fall risk High fall risk   Functional Status Survey:    Vitals:   03/29/22 1350  BP: 134/74  Pulse: 90  Resp: 20  Temp: (!) 97.5 F (36.4 C)  TempSrc: Skin  SpO2: 95%  Weight: 136 lb (61.7 kg)  Height: '5\' 6"'$  (1.676 m)   Body mass index is 21.95 kg/m. Physical Exam Vitals reviewed.  Constitutional:      General: He is not in acute distress. HENT:     Head: Normocephalic.  Eyes:     General:        Right eye: No discharge.        Left eye: No discharge.     Pupils: Pupils are equal, round, and reactive to light.  Cardiovascular:     Rate and Rhythm: Normal rate and regular rhythm.     Pulses: Normal pulses.     Heart sounds: Normal heart sounds.  Pulmonary:     Effort: Pulmonary effort is normal. No respiratory distress.     Breath sounds: Normal breath sounds. No wheezing.  Abdominal:     General: Bowel sounds are normal. There is no distension.     Palpations: Abdomen is soft.     Tenderness: There is no abdominal tenderness.  Musculoskeletal:     Cervical back: Neck supple.     Right lower leg: No edema.     Left lower leg: No edema.     Comments: Limited exam due to agitation, increased right knee pain with flexion 90 degrees, some medial tenderness, unable to examine right hip  Skin:    General: Skin  is warm and dry.     Capillary Refill: Capillary refill takes less than 2 seconds.     Comments: Scattered bruising to  all extremities  Neurological:     General: No focal deficit present.     Mental Status: He is alert. Mental status is at baseline.     Motor: Weakness present.     Gait: Gait abnormal.     Comments: wheelchair  Psychiatric:        Mood and Affect: Mood normal.        Behavior: Behavior normal.     Labs reviewed: Recent Labs    11/23/21 1526 01/19/22 0734 03/09/22 0000 03/24/22 1409  NA 140 143 143 147*  K 4.4 4.3 3.6 4.6  CL 103 104 104 111  CO2 25 22 30* 27  GLUCOSE 97 87  --  93  BUN 15 12 37* 25*  CREATININE 0.89 0.90 1.0 0.71  CALCIUM 9.5 9.2 9.6 9.2   Recent Labs    11/23/21 1526 01/19/22 0734 03/09/22 0000 03/24/22 1409  AST 20 31 44* 48*  ALT 21 36 37 42  ALKPHOS 68 54 52 73  BILITOT 0.4 0.4  --  0.6  PROT 6.4 6.3  --  6.4*  ALBUMIN 4.6 4.4 4.4 4.0   Recent Labs    11/23/21 1526 01/19/22 0734 03/09/22 0000 03/24/22 1409  WBC 3.1* 2.5* 3.5 3.2*  NEUTROABS 2.1 1.3*  --   --   HGB 13.5 15.1 13.8 13.9  HCT 40.3 42.1 40* 42.2  MCV 92 91  --  97.9  PLT 176 169 155 173   Lab Results  Component Value Date   TSH 3.110 11/23/2021   No results found for: "HGBA1C" Lab Results  Component Value Date   CHOL 110 11/23/2021   HDL 49 11/23/2021   LDLCALC 43 11/23/2021   TRIG 93 11/23/2021   CHOLHDL 2.2 11/23/2021    Significant Diagnostic Results in last 30 days:  No results found.  Assessment/Plan 1. Chronic pain of right knee - 08/22 X- ray right knee negative for acute fracture or dislocation, moderate degenerative changes with narrowing of medical joint compartment, unclear if tear of medial meniscus - limited exam, increased pain with flexion - cont tylenol 500 mg po BID and PRN - start voltaren gel 1%- apply to right knee TID - start lidocaine patch 4%- apply to right hip x 14 days  2. Right hip pain - see above - X-ray  right hip no acute fracture or dislocation, moderate degenerative changed noted  3. Dysuria - poor po intake due to psych medications - c/o burning with urination today - UA/culture not done in ED - UA/culture  4. Depression, psychotic [facility]) - 08/19 psych consult- did not meet inpatient criteria due to ambulation/dementia - swinging/spitting less, using foul language to staff - Depakote increased to 500 mg BID - Remeron reduced to 15 mg qhs - Zyprexa 2.5 mg po BID - Zyprexa 2.5 mg IM BID prn for aggressive behaviors - cont ativan, Aricept, Namenda, Rexulti and Trintellix - f/u with Dr. Casimiro Needle 08/30 via telephone with wife and NP  5. Alzheimer's dementia with behavioral disturbance ([place]) - followed by neurology - see above - cont Aricept and Namenda - cont skilled nursing    Family/ staff Communication: plan discussed with patient and nurse  Labs/tests ordered:  UA/culture

## 2022-03-31 ENCOUNTER — Encounter: Payer: Self-pay | Admitting: Orthopedic Surgery

## 2022-03-31 ENCOUNTER — Non-Acute Institutional Stay (SKILLED_NURSING_FACILITY): Payer: Medicare Other | Admitting: Orthopedic Surgery

## 2022-03-31 DIAGNOSIS — F02B11 Dementia in other diseases classified elsewhere, moderate, with agitation: Secondary | ICD-10-CM

## 2022-03-31 DIAGNOSIS — M25561 Pain in right knee: Secondary | ICD-10-CM | POA: Diagnosis not present

## 2022-03-31 DIAGNOSIS — G309 Alzheimer's disease, unspecified: Secondary | ICD-10-CM

## 2022-03-31 DIAGNOSIS — M25551 Pain in right hip: Secondary | ICD-10-CM

## 2022-03-31 DIAGNOSIS — F02818 Dementia in other diseases classified elsewhere, unspecified severity, with other behavioral disturbance: Secondary | ICD-10-CM

## 2022-03-31 DIAGNOSIS — G308 Other Alzheimer's disease: Secondary | ICD-10-CM

## 2022-03-31 DIAGNOSIS — G8929 Other chronic pain: Secondary | ICD-10-CM

## 2022-03-31 DIAGNOSIS — R300 Dysuria: Secondary | ICD-10-CM

## 2022-03-31 DIAGNOSIS — F323 Major depressive disorder, single episode, severe with psychotic features: Secondary | ICD-10-CM | POA: Diagnosis not present

## 2022-03-31 MED ORDER — LORAZEPAM 0.5 MG PO TABS: 0.5000 mg | ORAL_TABLET | Freq: Two times a day (BID) | ORAL | 1 refills | Status: DC | PRN

## 2022-03-31 NOTE — Progress Notes (Signed)
Location:  [facility] Room Number: 30/A Place of Service:  SNF [phone]) Provider:  Yvonna Alanis, NP   Virgie Dad, MD  Patient Care Team: Virgie Dad, MD as PCP - General (Internal Medicine) Francina Ames, MD as Referring Physician (Otolaryngology) Eppie Gibson, MD as Attending Physician (Radiation Oncology) Leota Sauers, RN (Inactive) as Oncology Nurse Navigator Lenn Cal, DDS (Inactive) as Consulting Physician (Dentistry) Tat, Eustace Quail, DO as Consulting Physician (Neurology) Alla Feeling, NP as Nurse Practitioner (Nurse Practitioner)  Extended Emergency Contact Information Primary Emergency Contact: Degraaf,[name] Address: [address]          [place], [place] [address] Johnnette Litter of [place] Phone: [phone] Mobile Phone: [phone] Relation: Spouse  Code Status:  Full code Goals of care: Advanced Directive information    03/27/2022   10:03 AM  Advanced Directives  Does Patient Have a Medical Advance Directive? Yes  Type of Paramedic of [place];Living will  Does patient want to make changes to medical advance directive? No - Patient declined  Copy of [place] in Chart? Yes - validated most recent copy scanned in chart (See row information)     Chief Complaint  Patient presents with   Acute Visit    agitation    HPI:  Pt is a 76 y.o. male seen today for acute visit due to agitation.   "He currently resides on the skilled nursing unit at [facility] due to dementia. Evaluated by Dr. Casimiro Needle via telephone 08/18 due to increased agitation, psychosis, and worsening dementia. Inpatient geropsych admission recommended. He was transferred to [facility] ED 08/18 for medical clearance. WBC 3.2, Na+ 147, K+ 4.6, BUN/creat 25/0.71, AST/ALT 48/42, valproic acid 79, acetaminophen level <88, salicylate <2.8, Ethyl < 10 , covid negative. During his stay he was observed by  nursing staff to spit medications, throw food, kick and punch during ADLs. 08/19 psych evaluation revealed he did not meet inpatient criteria due to his inability to ambulate and diagnosis of dementia. His depakote was increased to 500 mg BID and Remeron decreased to 15 mg qhs. Remains on Zyprexa 2.5 mg BID, Trintellix, Brexpiprazole and ativan. He was given Zyprexa 2.5 mg IM BID prn for increased agitation, advised to continue at discharge. Advised to follow up with provider to assess and evaluate symptoms, progress with treatment. Discharged back to [facility] 08/20. "  This morning he is spitting at staff, yelling obscenities, and swinging arms. Yesterday, we was observed hitting the walls in his room. 08/23 UA/culture ordered, not collected due to agitation. Followed by Dr. Casimiro Needle, next scheduled meeting 08/30 @ 4:30 pm via telephone.   08/22 xray right knee negative for acute fracture or dislocation, moderate degenerative changes with narrowing of medical joint compartment, unclear if tear of medial meniscus. X-ray right hip no acute fracture or dislocation, moderate degenerative changed notedHe was started on voltaren gel TID and lidocaine patch to right hip daily.    Past Medical History:  Diagnosis Date   Allergy    Alzheimer disease ([place]) 06/2018   diagnosed with early stage alzheimers   Arthritis    osteoarthritis   Cancer ([place])    testicular, age 71, orchiectomy 1992   Cataract    B/L CATARACTS NO SURGERY   Dementia ([place])    Depression    Dyslipidemia    Heartburn    TAKES ZANTAC   History of radiation therapy 12/02/18-  01/14/19   Head and neck/ base of tongue. 60 Gy over 30 fractions.    Hypertension    Hypogonadism male    Prostate cancer ([place]) 06/07/11   gleason 3+3=6, vol 59.5 cc   Sleep apnea    Status post chemotherapy    testicular cancer 1992, Dr Beryle Beams   Past Surgical History:  Procedure Laterality Date   APPENDECTOMY     COLONOSCOPY  08/07/2005    g[facility]ner   DIRECT LARYNGOSCOPY N/A 08/19/2018   Procedure: DIRECT LARYNGOSCOPY;  Surgeon: Leta Baptist, MD;  Location: [facility];  Service: ENT;  Laterality: N/A;   INGUINAL HERNIA REPAIR     w/orchiectomy 1992, retroperitoneal lymph node dissection   IR GASTROSTOMY TUBE REMOVAL  02/28/2019   MENISECTOMY     R&L knee surgeries   PROSTATE SURGERY     biopsy x 2   [facility]  06/13/2021   right inferior mid medial external ear antehelix shave   shx1 Left 11/21/2021   epidermoid cyst inflamed and disrupted   SKIN BIOPSY Right 01/09/2022   chondrodermatitis nodularis helicis , 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   acetaminophen (TYLENOL) 500 MG tablet Take 500 mg by mouth daily as needed.   brexpiprazole (REXULTI) 2 MG TABS tablet Take 1 tablet (2 mg total) by mouth daily.   carboxymethylcellulose (REFRESH PLUS) 0.5 % SOLN 1 drop 3 (three) times daily as needed. One drop each eye 2-3 times daily   cholecalciferol (VITAMIN D3) 25 MCG (1000 UNIT) tablet Take 1,000 Units by mouth daily.   diclofenac Sodium (VOLTAREN ARTHRITIS PAIN) 1 % GEL Apply 2 g topically 3 (three) times daily. To right knee   divalproex (DEPAKOTE SPRINKLES) 125 MG capsule Take 4 capsules (500 mg total) by mouth 2 (two) times daily.   donepezil (ARICEPT) 10 MG tablet TAKE 1 TABLET(10 MG) BY MOUTH AT BEDTIME   famotidine (PEPCID) 10 MG tablet Take 10 mg by mouth in the morning.   ipratropium (ATROVENT) 0.06 % nasal spray Place 2 sprays into both nostrils 2 (two) times daily as needed.   lactose free nutrition (BOOST) LIQD Take 237 mLs by mouth 3 (three) times daily between meals.    levothyroxine (SYNTHROID) 75 MCG tablet TAKE 1 TABLET(75 MCG) BY MOUTH DAILY   lidocaine (HM LIDOCAINE PATCH) 4 % Place 1 patch onto the skin daily. To right hip   LORazepam (ATIVAN) 0.5 MG tablet Take 0.5 mg by mouth daily. Give at 5 pm with dinner   LORazepam (ATIVAN) 0.5 MG tablet Take 1 tablet (0.5 mg total) by mouth every 6 (six) hours as needed for anxiety.   memantine (NAMENDA) 10 MG tablet Take 10 mg by mouth 2 (two) times daily.  Give crushed with breakfast/supper food   mirtazapine (REMERON SOL-TAB) 15 MG disintegrating tablet Take 1 tablet (15 mg total) by mouth at bedtime.   OLANZapine (ZYPREXA) 2.5 MG tablet Take 1 tablet (2.5 mg total) by mouth 2 (two) times daily.   polyethylene glycol powder (GLYCOLAX/MIRALAX) powder Take 17 g by mouth daily.   primidone (MYSOLINE) 50 MG tablet Take 1 tablet (50 mg total) by mouth 3 (  three) times daily.   rosuvastatin (CRESTOR) 20 MG tablet TAKE 1 TABLET(20 MG) BY MOUTH DAILY   sodium fluoride (PREVIDENT 5000 PLUS) 1.1 % CREA dental cream Apply cream to tooth brush. Brush teeth for 2 minutes. Spit out excess. DO NOT rinse afterwards. Repeat nightly.   terazosin (HYTRIN) 2 MG capsule TAKE 1 CAPSULE(2 MG) BY MOUTH DAILY   vortioxetine HBr (TRINTELLIX) 20 MG TABS tablet Take 1 tablet (20 mg total) by mouth daily.   zinc oxide 20 % ointment Apply 1 Application topically as needed for irritation.   No facility-administered encounter medications on file as of 03/31/2022.    Review of Systems  Unable to perform ROS: Dementia    Immunization History  Administered Date(s) Administered   Influenza Split 04/18/2012, 05/06/2013, 05/27/2015   Influenza Whole 04/12/2011   Influenza, High Dose Seasonal PF 05/21/2017, 05/16/2018, 04/11/2019, 05/09/2020   Influenza-Unspecified 04/23/2014, 05/18/2016, 05/21/2017, 05/16/2018, 05/09/2020, 04/19/2021   PFIZER Comirnaty(Gray Top)Covid-19 Tri-Sucrose Vaccine 11/16/2020   PFIZER(Purple Top)SARS-COV-2 Vaccination  09/19/2019, 10/03/2019, 04/06/2020   Pneumococcal Conjugate-13 08/31/2015   Pneumococcal Polysaccharide-23 12/09/2009   Tdap 01/17/2000, 07/25/2012   Zoster Recombinat (Shingrix) 05/04/2021, 11/01/2021   Zoster, Live 12/08/2008   Pertinent  Health Maintenance Due  Topic Date Due   INFLUENZA VACCINE  03/07/2022      03/24/2022    1:45 PM 03/24/2022    2:12 PM 03/25/2022   11:33 AM 03/25/2022   11:36 PM 03/26/2022    7:14 AM  Fall Risk  Patient Fall Risk Level High fall risk High fall risk Moderate fall risk High fall risk High fall risk   Functional Status Survey:    Vitals:   03/31/22 1338  BP: 134/74  Pulse: 90  Resp: 20  Temp: (!) 97.5 F (36.4 C)  SpO2: 95%  Weight: 136 lb (61.7 kg)  Height: '5\' 6"'$  (1.676 m)   Body mass index is 21.95 kg/m. Physical Exam Vitals reviewed.  Constitutional:      General: He is not in acute distress. HENT:     Head: Normocephalic.  Eyes:     General:        Right eye: No discharge.        Left eye: No discharge.  Cardiovascular:     Rate and Rhythm: Normal rate and regular rhythm.     Pulses: Normal pulses.     Heart sounds: Normal heart sounds.  Pulmonary:     Effort: Pulmonary effort is normal.     Breath sounds: Normal breath sounds.  Abdominal:     General: Bowel sounds are normal. There is no distension.     Palpations: Abdomen is soft.     Tenderness: There is no abdominal tenderness.  Musculoskeletal:     Cervical back: Neck supple.     Comments: Unable to examine due to agitation  Skin:    General: Skin is warm and dry.     Capillary Refill: Capillary refill takes less than 2 seconds.  Neurological:     General: No focal deficit present.     Mental Status: He is alert. Mental status is at baseline.     Motor: Weakness present.     Gait: Gait abnormal.     Comments: wheelchair  Psychiatric:        Speech: Speech is slurred.        Behavior: Behavior is agitated.     Labs reviewed: Recent Labs     11/23/21 1526 01/19/22 0734 03/09/22 0000 03/24/22 1409  NA 140 143 143 147*  K 4.4 4.3 3.6 4.6  CL 103 104 104 111  CO2 25 22 30* 27  GLUCOSE 97 87  --  93  BUN 15 12 37* 25*  CREATININE 0.89 0.90 1.0 0.71  CALCIUM 9.5 9.2 9.6 9.2   Recent Labs    11/23/21 1526 01/19/22 0734 03/09/22 0000 03/24/22 1409  AST 20 31 44* 48*  ALT 21 36 37 42  ALKPHOS 68 54 52 73  BILITOT 0.4 0.4  --  0.6  PROT 6.4 6.3  --  6.4*  ALBUMIN 4.6 4.4 4.4 4.0   Recent Labs    11/23/21 1526 01/19/22 0734 03/09/22 0000 03/24/22 1409  WBC 3.1* 2.5* 3.5 3.2*  NEUTROABS 2.1 1.3*  --   --   HGB 13.5 15.1 13.8 13.9  HCT 40.3 42.1 40* 42.2  MCV 92 91  --  97.9  PLT 176 169 155 173   Lab Results  Component Value Date   TSH 3.110 11/23/2021   No results found for: "HGBA1C" Lab Results  Component Value Date   CHOL 110 11/23/2021   HDL 49 11/23/2021   LDLCALC 43 11/23/2021   TRIG 93 11/23/2021   CHOLHDL 2.2 11/23/2021    Significant Diagnostic Results in last 30 days:  No results found.  Assessment/Plan 1. Depression, psychotic [facility]) - 08/19 psych consult- did not meet inpatient criteria due to ambulation/dementia - swinging/spitting less, using foul language to staff - Depakote increased to 500 mg BID - Remeron reduced to 15 mg qhs - Zyprexa 2.5 mg po BID - Zyprexa 2.5 mg IM BID prn for aggressive behaviors - cont ativan, Aricept, Namenda, Rexulti and Trintellix - discontinue ativan 0.5 mg @ 5 pm and prn q6hrs  - start ativan 0.5 mg po TID- give at 8 am/ 5 pm/ 9 pm - start Ativan 0.5 mg BID prn - f/u with Dr. Casimiro Needle 08/30 via telephone with wife and NP  2. Alzheimer's dementia with behavioral disturbance ([place]) - see above - cont Aricept and Namenda  3. Chronic pain of right knee - 08/22 xray with narrowing of medial joint, ? Meniscus tear - cont tylenol BID/PRN - start voltaren gel 1%- apply to right knee TID  4. Right hip pain - 08/22 xray noted moderate degenerative  changes - start lidocaine patch 4%- apply to right hip x 14 days - see above  5. Dysuria - UA/culture pending collection    Family/ staff Communication: plan discussed with patient and nurse  Labs/tests ordered:  none

## 2022-04-03 ENCOUNTER — Other Ambulatory Visit: Payer: Self-pay | Admitting: Orthopedic Surgery

## 2022-04-03 DIAGNOSIS — G308 Other Alzheimer's disease: Secondary | ICD-10-CM

## 2022-04-03 DIAGNOSIS — F323 Major depressive disorder, single episode, severe with psychotic features: Secondary | ICD-10-CM

## 2022-04-03 MED ORDER — LORAZEPAM 0.5 MG PO TABS: 0.5000 mg | ORAL_TABLET | Freq: Two times a day (BID) | ORAL | 1 refills | Status: DC | PRN

## 2022-04-03 MED ORDER — LORAZEPAM 0.5 MG PO TABS: 0.5000 mg | ORAL_TABLET | Freq: Three times a day (TID) | ORAL | 0 refills | Status: DC

## 2022-04-04 ENCOUNTER — Non-Acute Institutional Stay (SKILLED_NURSING_FACILITY): Payer: Medicare Other | Admitting: Adult Health

## 2022-04-04 ENCOUNTER — Encounter: Payer: Self-pay | Admitting: Adult Health

## 2022-04-04 DIAGNOSIS — F323 Major depressive disorder, single episode, severe with psychotic features: Secondary | ICD-10-CM | POA: Diagnosis not present

## 2022-04-04 DIAGNOSIS — R630 Anorexia: Secondary | ICD-10-CM

## 2022-04-04 DIAGNOSIS — T148XXA Other injury of unspecified body region, initial encounter: Secondary | ICD-10-CM

## 2022-04-04 DIAGNOSIS — F419 Anxiety disorder, unspecified: Secondary | ICD-10-CM | POA: Diagnosis not present

## 2022-04-04 DIAGNOSIS — F5101 Primary insomnia: Secondary | ICD-10-CM

## 2022-04-04 DIAGNOSIS — F02818 Dementia in other diseases classified elsewhere, unspecified severity, with other behavioral disturbance: Secondary | ICD-10-CM

## 2022-04-04 DIAGNOSIS — G309 Alzheimer's disease, unspecified: Secondary | ICD-10-CM

## 2022-04-04 NOTE — Progress Notes (Signed)
Location:  [facility] Room Number: NO/30/A Place of Service:  SNF (31) Provider:  Durenda Age, DNP, FNP-BC  Patient Care Team: Virgie Dad, MD as PCP - General (Internal Medicine) Francina Ames, MD as Referring Physician (Otolaryngology) Eppie Gibson, MD as Attending Physician (Radiation Oncology) Leota Sauers, RN (Inactive) as Oncology Nurse Navigator Lenn Cal, DDS (Inactive) as Consulting Physician (Dentistry) Tat, Eustace Quail, DO as Consulting Physician (Neurology) Alla Feeling, NP as Nurse Practitioner (Nurse Practitioner)  Extended Emergency Contact Information Primary Emergency Contact: Lehnen,[name] Address: [address]          [place], [place] [address] Johnnette Litter of [place] Phone: [phone] Mobile Phone: [phone] Relation: Spouse  Code Status:  FULL  Goals of care: Advanced Directive information    03/27/2022   10:03 AM  Advanced Directives  Does Patient Have a Medical Advance Directive? Yes  Type of Paramedic of [place];Living will  Does patient want to make changes to medical advance directive? No - Patient declined  Copy of [place] in Chart? Yes - validated most recent copy scanned in chart (See row information)     Chief Complaint  Patient presents with   Acute Visit    Patient is being seen for an abrasion     HPI:  Pt is a 76 y.o. male seen today for an acute visit for abrasion on his left axilla. He was seen in the room today with wife at bedside who has been assisting him with breakfast. He has poor appetite. He is screaming foul words. He tried to grab and spat on provider. He is talking but confused and disoriented X 3. Charge nurse showed the abrasion on his left axilla. He was reported to have been swinging at staff and hitting the walls. Abrasion is dry with slight erythema. He is on several psychotropics such as Ativan,  Depakote, Rexulti, Trintellix, Zyprexa and Remeron. He was sent to the ED on 03/24/22 for aggressive behavior. He was discharged back to [facility] on  03/26/22.  Aggressive behavior such as yelling obscenities, banging on walls and spitting at staff was reported to occur daily. He is scheduled to be re-evaluated by Dr. Casimiro Needle, psychiatrist, tomorrow, 04/05/22.   Past Medical History:  Diagnosis Date   Allergy    Alzheimer disease ([place]) 06/2018   diagnosed with early stage alzheimers   Arthritis    osteoarthritis   Cancer ([place])    testicular, age 27, orchiectomy 1992   Cataract    B/L CATARACTS NO SURGERY   Dementia ([place])    Depression    Dyslipidemia    Heartburn    TAKES ZANTAC   History of radiation therapy 12/02/18- 01/14/19   Head and neck/ base of tongue. 60 Gy over 30 fractions.    Hypertension    Hypogonadism male    Prostate cancer ([place]) 06/07/11   gleason 3+3=6, vol 59.5 cc   Sleep apnea    Status post chemotherapy    testicular cancer 1992, Dr Beryle Beams   Past Surgical History:  Procedure Laterality Date   APPENDECTOMY     COLONOSCOPY  08/07/2005   gessner   DIRECT LARYNGOSCOPY N/A 08/19/2018   Procedure: DIRECT LARYNGOSCOPY;  Surgeon: Leta Baptist, MD;  Location: [facility];  Service: ENT;  Laterality: N/A;   INGUINAL HERNIA REPAIR     w/orchiectomy 1992, retroperitoneal lymph node dissection   IR GASTROSTOMY  TUBE REMOVAL  02/28/2019   MENISECTOMY     R&L knee surgeries   PROSTATE SURGERY     biopsy x 2   SHX1  06/13/2021   right inferior mid medial external ear antehelix shave   shx1 Left 11/21/2021   epidermoid cyst inflamed and disrupted   SKIN BIOPSY Right 01/09/2022   chondrodermatitis nodularis helicis , 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   acetaminophen (TYLENOL) 500 MG tablet Take 500 mg by mouth daily as needed.   brexpiprazole (REXULTI) 2 MG TABS tablet Take 1 tablet (2 mg total) by mouth daily.   carboxymethylcellulose (REFRESH PLUS) 0.5 % SOLN 1 drop 3 (three) times daily as needed. One drop each eye 2-3 times daily   cholecalciferol (VITAMIN D3) 25 MCG (1000 UNIT) tablet Take 1,000 Units by mouth daily.   diclofenac Sodium (VOLTAREN ARTHRITIS PAIN) 1 % GEL Apply 2 g topically 3 (three) times daily. To right knee   divalproex (DEPAKOTE SPRINKLES) 125 MG capsule Take 4 capsules (500 mg total) by mouth 2 (two) times daily.   donepezil (ARICEPT) 10 MG tablet TAKE 1 TABLET(10 MG) BY MOUTH AT BEDTIME   famotidine (PEPCID) 10 MG tablet Take 10 mg by mouth in the morning.   ipratropium (ATROVENT) 0.06 % nasal spray Place 2 sprays into both nostrils 2 (two) times daily as needed.   lactose free nutrition (BOOST) LIQD Take 237 mLs by mouth 3 (three) times daily between meals.   levothyroxine (SYNTHROID) 75 MCG tablet TAKE 1 TABLET(75 MCG) BY MOUTH DAILY   lidocaine (HM LIDOCAINE PATCH) 4 % Place 1 patch onto the skin daily. To right hip   LORazepam (ATIVAN) 0.5 MG tablet Take 1 tablet (0.5 mg total) by mouth in the morning, at noon, and at bedtime. Give at 8 am, 5 pm and 9 pm   LORazepam (ATIVAN) 0.5 MG tablet Take 1 tablet (0.5 mg total) by mouth 2 (two) times daily as needed for anxiety.   memantine (NAMENDA) 10 MG tablet Take 10 mg by mouth 2 (two) times daily.  Give crushed with breakfast/supper food   mirtazapine (REMERON SOL-TAB) 15 MG disintegrating tablet Take 1 tablet (15 mg total) by mouth at bedtime.   OLANZapine (ZYPREXA) 2.5 MG tablet Take 1 tablet (2.5 mg total) by mouth 2 (two) times daily.   polyethylene glycol powder (GLYCOLAX/MIRALAX) powder Take 17 g by  mouth daily.   primidone (MYSOLINE) 50 MG tablet Take 1 tablet (50 mg total) by mouth 3 (three) times daily.   rosuvastatin (CRESTOR) 20 MG tablet TAKE 1 TABLET(20 MG) BY MOUTH DAILY   sodium fluoride (PREVIDENT 5000 PLUS) 1.1 % CREA dental cream Apply cream to tooth brush. Brush teeth for 2 minutes. Spit out excess. DO NOT rinse afterwards. Repeat nightly.   terazosin (HYTRIN) 2 MG capsule TAKE 1 CAPSULE(2 MG) BY MOUTH DAILY   vortioxetine HBr (TRINTELLIX) 20 MG TABS tablet Take 1 tablet (20 mg total) by mouth daily.   zinc oxide 20 % ointment Apply 1 Application topically as needed for irritation.   No facility-administered encounter medications on file as of 04/04/2022.    Review of  Systems  Unable to obtain due to dementia.    Immunization History  Administered Date(s) Administered   Influenza Split 04/18/2012, 05/06/2013, 05/27/2015   Influenza Whole 04/12/2011   Influenza, High Dose Seasonal PF 05/21/2017, 05/16/2018, 04/11/2019, 05/09/2020   Influenza-Unspecified 04/23/2014, 05/18/2016, 05/21/2017, 05/16/2018, 05/09/2020, 04/19/2021   PFIZER Comirnaty(Gray Top)Covid-19 Tri-Sucrose Vaccine 11/16/2020   PFIZER(Purple Top)SARS-COV-2 Vaccination 09/19/2019, 10/03/2019, 04/06/2020   Pneumococcal Conjugate-13 08/31/2015   Pneumococcal Polysaccharide-23 12/09/2009   Tdap 01/17/2000, 07/25/2012   Zoster Recombinat (Shingrix) 05/04/2021, 11/01/2021   Zoster, Live 12/08/2008   Pertinent  Health Maintenance Due  Topic Date Due   INFLUENZA VACCINE  03/07/2022      03/24/2022    1:45 PM 03/24/2022    2:12 PM 03/25/2022   11:33 AM 03/25/2022   11:36 PM 03/26/2022    7:14 AM  Fall Risk  Patient Fall Risk Level High fall risk High fall risk Moderate fall risk High fall risk High fall risk     Vitals:   04/04/22 1001  BP: 134/74  Pulse: 68  Resp: 20  Temp: (!) 97.5 F (36.4 C)  SpO2: 95%  Weight: 136 lb (61.7 kg)  Height: '5\' 6"'$  (1.676 m)   Body mass index is 21.95  kg/m.  Physical Exam Constitutional:      General: He is not in acute distress. HENT:     Head: Normocephalic and atraumatic.     Mouth/Throat:     Mouth: Mucous membranes are moist.  Eyes:     Conjunctiva/sclera: Conjunctivae normal.  Cardiovascular:     Rate and Rhythm: Normal rate and regular rhythm.     Pulses: Normal pulses.     Heart sounds: Normal heart sounds.  Pulmonary:     Effort: Pulmonary effort is normal.     Breath sounds: Normal breath sounds.  Abdominal:     General: Bowel sounds are normal.     Palpations: Abdomen is soft.  Musculoskeletal:        General: No swelling.     Cervical back: Normal range of motion.  Skin:    General: Skin is warm and dry.     Comments: Abrasion on left axilla  Neurological:     Mental Status: He is alert. Mental status is at baseline. He is disoriented.  Psychiatric:     Comments: Agitated, yelling and spitting.      Labs reviewed: Recent Labs    11/23/21 1526 01/19/22 0734 03/09/22 0000 03/24/22 1409  NA 140 143 143 147*  K 4.4 4.3 3.6 4.6  CL 103 104 104 111  CO2 25 22 30* 27  GLUCOSE 97 87  --  93  BUN 15 12 37* 25*  CREATININE 0.89 0.90 1.0 0.71  CALCIUM 9.5 9.2 9.6 9.2   Recent Labs    11/23/21 1526 01/19/22 0734 03/09/22 0000 03/24/22 1409  AST 20 31 44* 48*  ALT 21 36 37 42  ALKPHOS 68 54 52 73  BILITOT 0.4 0.4  --  0.6  PROT 6.4 6.3  --  6.4*  ALBUMIN 4.6 4.4 4.4 4.0   Recent Labs    11/23/21 1526 01/19/22 0734 03/09/22 0000 03/24/22 1409  WBC 3.1* 2.5* 3.5 3.2*  NEUTROABS 2.1 1.3*  --   --   HGB 13.5 15.1 13.8 13.9  HCT 40.3 42.1 40* 42.2  MCV 92 91  --  97.9  PLT 176 169 155 173   Lab Results  Component Value Date   TSH 3.110 11/23/2021  No results found for: "HGBA1C" Lab Results  Component Value Date   CHOL 110 11/23/2021   HDL 49 11/23/2021   LDLCALC 43 11/23/2021   TRIG 93 11/23/2021   CHOLHDL 2.2 11/23/2021    Significant Diagnostic Results in last 30 days:  No  results found.  Assessment/Plan  1. Abrasion -  no infection noted -  Cleanse abrasion with NS and apply triple antibiotic ointment daily  2. 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 Casimiro Needle  3. Anxiety -  anxious, continue Ativan PRN  4. Poor appetite -  continue Remeron -  continue supplementation and assistance during meals  5. Primary insomnia -   continue Remeron  6. Alzheimer's dementia with behavioral disturbance ([place]) -  has severe cognitive impairment -  continue Donepezil and Memantine   Family/ staff Communication: Discussed plan of care with wife and charge nurse.  Labs/tests ordered:  None    Durenda Age, DNP, MSN, FNP-BC [facility] [phone] (Monday-Friday 8:00 a.m. - 5:00 p.m.) [phone] (after hours)

## 2022-04-05 ENCOUNTER — Non-Acute Institutional Stay (SKILLED_NURSING_FACILITY): Payer: Medicare Other | Admitting: Orthopedic Surgery

## 2022-04-05 ENCOUNTER — Telehealth (HOSPITAL_BASED_OUTPATIENT_CLINIC_OR_DEPARTMENT_OTHER): Payer: Medicare Other | Admitting: Psychiatry

## 2022-04-05 ENCOUNTER — Encounter: Payer: Self-pay | Admitting: Orthopedic Surgery

## 2022-04-05 DIAGNOSIS — F323 Major depressive disorder, single episode, severe with psychotic features: Secondary | ICD-10-CM | POA: Diagnosis not present

## 2022-04-05 DIAGNOSIS — E440 Moderate protein-calorie malnutrition: Secondary | ICD-10-CM | POA: Diagnosis not present

## 2022-04-05 DIAGNOSIS — F02818 Dementia in other diseases classified elsewhere, unspecified severity, with other behavioral disturbance: Secondary | ICD-10-CM

## 2022-04-05 DIAGNOSIS — F339 Major depressive disorder, recurrent, unspecified: Secondary | ICD-10-CM | POA: Diagnosis not present

## 2022-04-05 DIAGNOSIS — G309 Alzheimer's disease, unspecified: Secondary | ICD-10-CM

## 2022-04-05 MED ORDER — OLANZAPINE 2.5 MG PO TABS: ORAL_TABLET | ORAL | 2 refills | Status: DC

## 2022-04-05 MED ORDER — BREXPIPRAZOLE 3 MG PO TABS: 2.0000 mg | ORAL_TABLET | Freq: Every day | ORAL | 2 refills | Status: DC

## 2022-04-05 NOTE — Progress Notes (Signed)
Location:  [address] Room Number: 30/A Place of Service:  SNF [phone]) Provider:  Yvonna Alanis, NP   Virgie Dad, MD  Patient Care Team: Virgie Dad, MD as PCP - General (Internal Medicine) Francina Ames, MD as Referring Physician (Otolaryngology) Eppie Gibson, MD as Attending Physician (Radiation Oncology) Leota Sauers, RN (Inactive) as Oncology Nurse Navigator Lenn Cal, DDS (Inactive) as Consulting Physician (Dentistry) Tat, Eustace Quail, DO as Consulting Physician (Neurology) Alla Feeling, NP as Nurse Practitioner (Nurse Practitioner)  Extended Emergency Contact Information Primary Emergency Contact: Wainer,[name] Address: [address]          [place] [place], [place] [address] Johnnette Litter of [place] Phone: [phone] Mobile Phone: [phone] Relation: Spouse  Code Status:  Full code Goals of care: Advanced Directive information    03/27/2022   10:03 AM  Advanced Directives  Does Patient Have a Medical Advance Directive? Yes  Type of Paramedic of [place];Living will  Does patient want to make changes to medical advance directive? No - Patient declined  Copy of [place] in Chart? Yes - validated most recent copy scanned in chart (See row information)     Chief Complaint  Patient presents with  . Acute Visit    Weight loss     HPI:  Pt is a 76 y.o. male seen today for acute visit due to weight loss.   "He currently resides on the skilled nursing unit at [facility] due to dementia. Evaluated by Dr. Casimiro Needle via telephone 08/18 due to increased agitation, psychosis, and worsening dementia. Inpatient geropsych admission recommended. He was transferred to [facility] ED 08/18 for medical clearance. WBC 3.2, Na+ 147, K+ 4.6, BUN/creat 25/0.71, AST/ALT 48/42, valproic acid 79, acetaminophen level <58, salicylate <8.5, Ethyl < 10 , covid negative. During his stay he was  observed by nursing staff to spit medications, throw food, kick and punch during ADLs. 08/19 psych evaluation revealed he did not meet inpatient criteria due to his inability to ambulate and diagnosis of dementia. His depakote was increased to 500 mg BID and Remeron decreased to 15 mg qhs. Remains on Zyprexa 2.5 mg BID, Trintellix, Brexpiprazole and ativan. He was given Zyprexa 2.5 mg IM BID prn for increased agitation, advised to continue at discharge. Advised to follow up with provider to assess and evaluate symptoms, progress with treatment. Discharged back to [facility] 08/20. "  Admission weight 110 lbs 08/01. He is being weighted weekly. Weights 08/09 & 08/16 106 lbs. Today, he weighs 91.5 lbs. See below  He continues to have daily episodes on aggression. Behaviors include spitting, swinging/hitting healthcare staff, pounding walls in his room, and yelling obscenities. His Ativan has been increased to TID with no improvement. He has had to receive Zyprexa IM twice in past week. He remains on Remeron, Depakote, Trintellix and Brexpiprazole.   Stop Am zyprexa, increase rexulti to 3 mg   Past Medical History:  Diagnosis Date  . Allergy   . Alzheimer disease ([place]) 06/2018   diagnosed with early stage alzheimers  . Arthritis    osteoarthritis  . Cancer [facility])    testicular, age 59, orchiectomy 15  . Cataract    B/L CATARACTS NO SURGERY  . Dementia ([place])   . Depression   . Dyslipidemia   . Heartburn    TAKES ZANTAC  . History of radiation therapy 12/02/18- 01/14/19   Head and neck/ base of  tongue. 60 Gy over 30 fractions.   . Hypertension   . Hypogonadism male   . Prostate cancer ([place]) 06/07/11   gleason 3+3=6, vol 59.5 cc  . Sleep apnea   . Status post chemotherapy    testicular cancer 1992, Dr Beryle Beams   Past Surgical History:  Procedure Laterality Date  . APPENDECTOMY    . COLONOSCOPY  08/07/2005   gessner  . DIRECT LARYNGOSCOPY N/A 08/19/2018   Procedure: DIRECT  LARYNGOSCOPY;  Surgeon: Leta Baptist, MD;  Location: [facility];  Service: ENT;  Laterality: N/A;  . INGUINAL HERNIA REPAIR     w/orchiectomy 1992, retroperitoneal lymph node dissection  . IR GASTROSTOMY TUBE REMOVAL  02/28/2019  . MENISECTOMY     R&L knee surgeries  . PROSTATE SURGERY     biopsy x 2  . [facility]  06/13/2021   right inferior mid medial external ear antehelix shave  . shx1 Left 11/21/2021   epidermoid cyst inflamed and disrupted  . 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 TONGUE BIOPSY N/A 08/19/2018   Procedure: BIOPSY OF TONGUE BASE MASS;  Surgeon: Leta Baptist, MD;  Location: [facility];  Service: ENT;  Laterality: N/A;    No Known Allergies  Outpatient Encounter Medications as of 04/05/2022  Medication Sig  . acetaminophen (TYLENOL) 500 MG tablet Take 1,000 mg by mouth 2 (two) times daily.  Marland Kitchen acetaminophen (TYLENOL) 500 MG tablet Take 500 mg by mouth daily as needed.  . brexpiprazole (REXULTI) 2 MG TABS tablet Take 1 tablet (2 mg total) by mouth daily.  . carboxymethylcellulose (REFRESH PLUS) 0.5 % SOLN 1 drop 3 (three) times daily as needed. One drop each eye 2-3 times daily  . cholecalciferol (VITAMIN D3) 25 MCG (1000 UNIT) tablet Take 1,000 Units by mouth daily.  . diclofenac Sodium (VOLTAREN ARTHRITIS PAIN) 1 % GEL Apply 2 g topically 3 (three) times daily. To right knee  . divalproex (DEPAKOTE SPRINKLES) 125 MG capsule Take 4 capsules (500 mg total) by mouth 2 (two) times daily.  Marland Kitchen donepezil (ARICEPT) 10 MG tablet TAKE 1 TABLET(10 MG) BY MOUTH AT BEDTIME  . famotidine (PEPCID) 10 MG tablet Take 10 mg by mouth in the morning.  Marland Kitchen ipratropium (ATROVENT) 0.06 % nasal spray Place 2 sprays into both nostrils 2 (two) times daily as needed.  . lactose free nutrition (BOOST) LIQD Take 237 mLs by mouth 3 (three) times daily between meals.  Marland Kitchen levothyroxine (SYNTHROID) 75 MCG tablet TAKE 1  TABLET(75 MCG) BY MOUTH DAILY  . lidocaine (HM LIDOCAINE PATCH) 4 % Place 1 patch onto the skin daily. To right hip  . LORazepam (ATIVAN) 0.5 MG tablet Take 1 tablet (0.5 mg total) by mouth in the morning, at noon, and at bedtime. Give at 8 am, 5 pm and 9 pm  . LORazepam (ATIVAN) 0.5 MG tablet Take 1 tablet (0.5 mg total) by mouth 2 (two) times daily as needed for anxiety.  . memantine (NAMENDA) 10 MG tablet Take 10 mg by mouth 2 (two) times daily.  Give crushed with breakfast/supper food  . mirtazapine (REMERON SOL-TAB) 15 MG disintegrating tablet Take 1 tablet (15 mg total) by mouth at bedtime.  Marland Kitchen OLANZapine (ZYPREXA) 2.5 MG tablet Take 1 tablet (2.5 mg total) by mouth 2 (two) times daily.  . polyethylene glycol powder (GLYCOLAX/MIRALAX) powder Take 17 g by mouth daily.  . primidone (MYSOLINE) 50 MG tablet Take 1 tablet (50  mg total) by mouth 3 (three) times daily.  . rosuvastatin (CRESTOR) 20 MG tablet TAKE 1 TABLET(20 MG) BY MOUTH DAILY  . sodium fluoride (PREVIDENT 5000 PLUS) 1.1 % CREA dental cream Apply cream to tooth brush. Brush teeth for 2 minutes. Spit out excess. DO NOT rinse afterwards. Repeat nightly.  . terazosin (HYTRIN) 2 MG capsule TAKE 1 CAPSULE(2 MG) BY MOUTH DAILY  . vortioxetine HBr (TRINTELLIX) 20 MG TABS tablet Take 1 tablet (20 mg total) by mouth daily.  Marland Kitchen zinc oxide 20 % ointment Apply 1 Application topically as needed for irritation.   No facility-administered encounter medications on file as of 04/05/2022.    Review of Systems  Immunization History  Administered Date(s) Administered  . Influenza Split 04/18/2012, 05/06/2013, 05/27/2015  . Influenza Whole 04/12/2011  . Influenza, High Dose Seasonal PF 05/21/2017, 05/16/2018, 04/11/2019, 05/09/2020  . Influenza-Unspecified 04/23/2014, 05/18/2016, 05/21/2017, 05/16/2018, 05/09/2020, 04/19/2021  . PFIZER Comirnaty(Gray Top)Covid-19 Tri-Sucrose Vaccine 11/16/2020  . PFIZER(Purple Top)SARS-COV-2 Vaccination 09/19/2019,  10/03/2019, 04/06/2020  . Pneumococcal Conjugate-13 08/31/2015  . Pneumococcal Polysaccharide-23 12/09/2009  . Tdap 01/17/2000, 07/25/2012  . Zoster Recombinat (Shingrix) 05/04/2021, 11/01/2021  . Zoster, Live 12/08/2008   Pertinent  Health Maintenance Due  Topic Date Due  . INFLUENZA VACCINE  03/07/2022      03/24/2022    1:45 PM 03/24/2022    2:12 PM 03/25/2022   11:33 AM 03/25/2022   11:36 PM 03/26/2022    7:14 AM  Fall Risk  Patient Fall Risk Level High fall risk High fall risk Moderate fall risk High fall risk High fall risk   Functional Status Survey:    Vitals:   04/05/22 1523  BP: 134/78  Pulse: 68  Resp: 16  Temp: (!) 96.8 F (36 C)  SpO2: 98%  Weight: 91 lb 8 oz (41.5 kg)   Body mass index is 14.77 kg/m. Physical Exam  Labs reviewed: Recent Labs    11/23/21 1526 01/19/22 0734 03/09/22 0000 03/24/22 1409  NA 140 143 143 147*  K 4.4 4.3 3.6 4.6  CL 103 104 104 111  CO2 25 22 30* 27  GLUCOSE 97 87  --  93  BUN 15 12 37* 25*  CREATININE 0.89 0.90 1.0 0.71  CALCIUM 9.5 9.2 9.6 9.2   Recent Labs    11/23/21 1526 01/19/22 0734 03/09/22 0000 03/24/22 1409  AST 20 31 44* 48*  ALT 21 36 37 42  ALKPHOS 68 54 52 73  BILITOT 0.4 0.4  --  0.6  PROT 6.4 6.3  --  6.4*  ALBUMIN 4.6 4.4 4.4 4.0   Recent Labs    11/23/21 1526 01/19/22 0734 03/09/22 0000 03/24/22 1409  WBC 3.1* 2.5* 3.5 3.2*  NEUTROABS 2.1 1.3*  --   --   HGB 13.5 15.1 13.8 13.9  HCT 40.3 42.1 40* 42.2  MCV 92 91  --  97.9  PLT 176 169 155 173   Lab Results  Component Value Date   TSH 3.110 11/23/2021   No results found for: "HGBA1C" Lab Results  Component Value Date   CHOL 110 11/23/2021   HDL 49 11/23/2021   LDLCALC 43 11/23/2021   TRIG 93 11/23/2021   CHOLHDL 2.2 11/23/2021    Significant Diagnostic Results in last 30 days:  No results found.  Assessment/Plan There are no diagnoses linked to this encounter.   Family/ staff Communication: ***  Labs/tests  ordered:  ***

## 2022-04-05 NOTE — Progress Notes (Addendum)
.  Psychiatric Initial Adult Assessment   Patient Identification: [name] MRN:  [id number] Date of Evaluation:  04/05/2022 Referral Source: Dr. Wells Guiles Tat Chief Complaint:   Today was a phone visit.  The patient was evaluated together with his wife [name] and 2 other staff members. The patient came home from the hospital at first he was over sedated.  But over time has gotten worse.  He is more hard to manage he is combative with care he curses often.  He spits.  He is uncooperative.  The patient is so agitated that it is hard for him to taking food and as a result he is losing some weight.  He is sleeping only fairly well at all.  His agitation comes and goes.  When he gets Ativan 0.5 mg in the middle day he will take a nap and calm down.  The patient is interacting very little in a meaningful way.  According to his family he is hearing voices and sees visions.  He seems to be very guarded and cautious and probably paranoid.  The facility says that he is inappropriate for this setting.  He is too hard to manage.  Unfortunately the left facility he was not accepted because he was in a wheelchair and because he was so uncooperative.  He also claimed that they would not want to take care of him because he was demented.  When I saw this patient a year ago he showed minimal evidence of dementia.  I think his cognitive decline is rapid and I am not sure it is a progressive neurological state or state of agitated depression. Time spent 30 minutes  patient at home  called from center  Depression Symptoms:  fatigue, (Hypo) Manic Symptoms:   Anxiety Symptoms:   Psychotic Symptoms:   PTSD Symptoms:   Past Psychiatric History: Trintellix, Wellbutrin  Previous Psychotropic Medications:   Substance Abuse History in the last 12 months:    Consequences of Substance Abuse:   Past Medical History:  Past Medical History:  Diagnosis Date   Allergy    Alzheimer disease ([place]) 06/2018   diagnosed  with early stage alzheimers   Arthritis    osteoarthritis   Cancer ([place])    testicular, age 57, orchiectomy 1992   Cataract    B/L CATARACTS NO SURGERY   Dementia ([place])    Depression    Dyslipidemia    Heartburn    TAKES ZANTAC   History of radiation therapy 12/02/18- 01/14/19   Head and neck/ base of tongue. 60 Gy over 30 fractions.    Hypertension    Hypogonadism male    Prostate cancer ([place]) 06/07/11   gleason 3+3=6, vol 59.5 cc   Sleep apnea    Status post chemotherapy    testicular cancer 1992, Dr Beryle Beams    Past Surgical History:  Procedure Laterality Date   APPENDECTOMY     COLONOSCOPY  08/07/2005   gessner   DIRECT LARYNGOSCOPY N/A 08/19/2018   Procedure: DIRECT LARYNGOSCOPY;  Surgeon: Leta Baptist, MD;  Location: [facility];  Service: ENT;  Laterality: N/A;   INGUINAL HERNIA REPAIR     w/orchiectomy 1992, retroperitoneal lymph node dissection   IR GASTROSTOMY TUBE REMOVAL  02/28/2019   MENISECTOMY     R&L knee surgeries   PROSTATE SURGERY     biopsy x 2   [facility]  06/13/2021   right inferior mid medial external ear antehelix shave   shx1 Left 11/21/2021  epidermoid cyst inflamed and disrupted   SKIN BIOPSY Right 01/09/2022   chondrodermatitis nodularis helicis , 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   TONGUE BIOPSY N/A 08/19/2018   Procedure: BIOPSY OF TONGUE BASE MASS;  Surgeon: Leta Baptist, MD;  Location: [facility];  Service: ENT;  Laterality: N/A;    Family Psychiatric History:   Family History:  Family History  Problem Relation Age of Onset   Lung cancer Mother    Hypertension Mother    Lung cancer Father    Heart failure Father    Hypertension Father     Social History:   Social History   Socioeconomic History   Marital status: Married    Spouse name: Not on file   Number of children: 0   Years of education: Not on file   Highest education level: Not on file  Occupational History   Occupation: RETIRED    Employer: [employer]  Tobacco Use   Smoking status: Former    Packs/day: 1.00    Years: 20.00    Total pack years: 20.00    Types: Cigarettes    Quit date: 08/03/1999    Years since quitting: 22.6   Smokeless tobacco: Never  Vaping Use   Vaping Use: Never used  Substance and Sexual Activity   Alcohol use: Not Currently   Drug use: No   Sexual activity: Yes  Other Topics Concern   Not on file  Social History Narrative   Not on file   Social Determinants of Health   Financial Resource Strain: Not on file  Food Insecurity: Not on file  Transportation Needs: No Transportation Needs (09/27/2018)   PRAPARE - Transportation    Lack of Transportation (Medical): No    Lack of Transportation (Non-Medical): No  Physical Activity: Not on file  Stress: Not on file  Social Connections: Not on file    Additional Social History:   Allergies:  No Known Allergies  Metabolic Disorder Labs: No results found for: "HGBA1C", "MPG" No results found for: "PROLACTIN" Lab Results  Component Value Date   CHOL 110 11/23/2021   TRIG 93 11/23/2021   HDL 49 11/23/2021   CHOLHDL 2.2 11/23/2021   VLDL 18 09/11/2016   LDLCALC 43 11/23/2021   LDLCALC 50 11/16/2020   Lab Results  Component Value Date   TSH 3.110 11/23/2021    Therapeutic Level Labs: No results found for: "LITHIUM" No results found for: "CBMZ" Lab Results  Component Value Date   VALPROATE 79 03/24/2022    Current Medications: Current Outpatient Medications  Medication Sig Dispense Refill   acetaminophen (TYLENOL) 500 MG tablet Take 1,000 mg by mouth 2 (two) times daily.     acetaminophen (TYLENOL) 500 MG tablet Take 500 mg by mouth daily as needed.     Brexpiprazole (REXULTI) 3 MG TABS Take 0.6667 tablets (2 mg total) by mouth daily. 30 tablet 2   carboxymethylcellulose (REFRESH PLUS) 0.5 % SOLN 1 drop 3 (three) times daily as needed. One drop each eye 2-3 times daily     cholecalciferol (VITAMIN D3) 25 MCG (1000 UNIT)  tablet Take 1,000 Units by mouth daily.     diclofenac Sodium (VOLTAREN ARTHRITIS PAIN) 1 % GEL Apply 2 g topically 3 (three) times daily. To right knee     divalproex (DEPAKOTE SPRINKLES) 125 MG capsule Take 4 capsules (500 mg total) by mouth 2 (two) times daily. 240 capsule 0   donepezil (ARICEPT) 10 MG tablet TAKE 1 TABLET(10 MG)  BY MOUTH AT BEDTIME 90 tablet 1   famotidine (PEPCID) 10 MG tablet Take 10 mg by mouth in the morning.     ipratropium (ATROVENT) 0.06 % nasal spray Place 2 sprays into both nostrils 2 (two) times daily as needed.     lactose free nutrition (BOOST) LIQD Take 237 mLs by mouth 3 (three) times daily between meals.     levothyroxine (SYNTHROID) 75 MCG tablet TAKE 1 TABLET(75 MCG) BY MOUTH DAILY 90 tablet 0   lidocaine (HM LIDOCAINE PATCH) 4 % Place 1 patch onto the skin daily. To right hip     LORazepam (ATIVAN) 0.5 MG tablet Take 1 tablet (0.5 mg total) by mouth in the morning, at noon, and at bedtime. Give at 8 am, 5 pm and 9 pm 90 tablet 0   LORazepam (ATIVAN) 0.5 MG tablet Take 1 tablet (0.5 mg total) by mouth 2 (two) times daily as needed for anxiety. 30 tablet 1   memantine (NAMENDA) 10 MG tablet Take 10 mg by mouth 2 (two) times daily.  Give crushed with breakfast/supper food     mirtazapine (REMERON SOL-TAB) 15 MG disintegrating tablet Take 1 tablet (15 mg total) by mouth at bedtime. 30 tablet 0   OLANZapine (ZYPREXA) 2.5 MG tablet 1 qhs 60 tablet 2   polyethylene glycol powder (GLYCOLAX/MIRALAX) powder Take 17 g by mouth daily. 3350 g 11   primidone (MYSOLINE) 50 MG tablet Take 1 tablet (50 mg total) by mouth 3 (three) times daily. 180 tablet 1   rosuvastatin (CRESTOR) 20 MG tablet TAKE 1 TABLET(20 MG) BY MOUTH DAILY 90 tablet 2   sodium fluoride (PREVIDENT 5000 PLUS) 1.1 % CREA dental cream Apply cream to tooth brush. Brush teeth for 2 minutes. Spit out excess. DO NOT rinse afterwards. Repeat nightly. 1 Tube prn   terazosin (HYTRIN) 2 MG capsule TAKE 1 CAPSULE(2  MG) BY MOUTH DAILY 90 capsule 1   vortioxetine HBr (TRINTELLIX) 20 MG TABS tablet Take 1 tablet (20 mg total) by mouth daily. 90 tablet 1   zinc oxide 20 % ointment Apply 1 Application topically as needed for irritation.     No current facility-administered medications for this visit.    Musculoskeletal: Strength & Muscle Tone: within normal limits Gait & Station: normal Patient leans: N/A  Psychiatric Specialty Exam: ROS  There were no vitals taken for this visit.There is no height or weight on file to calculate BMI.  General Appearance: Casual  Eye Contact:  Good  Speech:  Normal Rate  Volume:  Normal  Mood:  Negative  Affect:  Congruent  Thought Process:  Goal Directed  Orientation:  Full (Time, Place, and Person)  Thought Content:  Logical  Suicidal Thoughts:  No  Homicidal Thoughts:  No  Memory:  NA  Judgement:  Good  Insight:  Fair  Psychomotor Activity:  Normal  Concentration:    Recall:  [place] of Knowledge:Good  Language: Good  Akathisia:  No  Handed:  Right  AIMS (if indicated):  not done  Assets:  Desire for Improvement Financial Resources/Insurance  ADL's:  Intact  Cognition: Impaired,  Moderate  Sleep:     Screenings: Mini-Mental    Flowsheet Row Office Visit from 06/08/2016 in [facility]  Total Score (max 30 points ) 28      PHQ2-9    [place] Visit from 11/23/2021 in [facility] Visit from 11/02/2021 in [facility] Office Visit from 11/16/2020 in [facility]  Medicine Office Visit from 08/21/2019 in [facility] Visit from 09/18/2018 in [facility]  PHQ-2 Total Score 3 0 0 4 6  PHQ-9 Total Score '10 7 3 8 19      '$ Flowsheet Row ED from 03/24/2022 in [facility] No Risk       Assessment and Plan:    At this time I believe the patient has a dementing process that is mild that a major  depression superimposed.  We will continue taking Remeron at a low dose of 15 mg but will go ahead and increase his Rexulti to 3 mg and reduce his Zyprexa down to just taking it at night 1.5 mg.  Our goal is of course to get rid of the Zyprexa.  Patient takes Depakote 500 mg twice daily we will get a blood level drawn in the next week or 2.  Will continue taking Aricept 10 mg and Namenda twice daily.  Will continue taking Trintellix as ordered.  I think the patient needs more meaningful psychiatric nursing care.  I will make an attempt in the next week to see about possible transfer to either [facility] or to [facility]. Time spent  25 minutes

## 2022-04-06 ENCOUNTER — Emergency Department (HOSPITAL_COMMUNITY): Payer: Medicare Other

## 2022-04-06 ENCOUNTER — Other Ambulatory Visit: Payer: Self-pay

## 2022-04-06 ENCOUNTER — Encounter (HOSPITAL_COMMUNITY): Payer: Self-pay

## 2022-04-06 ENCOUNTER — Encounter: Payer: Self-pay | Admitting: Family Medicine

## 2022-04-06 ENCOUNTER — Encounter: Payer: Self-pay | Admitting: Internal Medicine

## 2022-04-06 ENCOUNTER — Non-Acute Institutional Stay (SKILLED_NURSING_FACILITY): Payer: Medicare Other | Admitting: Internal Medicine

## 2022-04-06 ENCOUNTER — Inpatient Hospital Stay (HOSPITAL_COMMUNITY)
Admission: EM | Admit: 2022-04-06 | Discharge: 2022-04-14 | DRG: 640 | Disposition: A | Discharge status: Hospice | Payer: Medicare Other | Source: Skilled Nursing Facility | Attending: Internal Medicine | Admitting: Internal Medicine

## 2022-04-06 DIAGNOSIS — D509 Iron deficiency anemia, unspecified: Secondary | ICD-10-CM | POA: Diagnosis present

## 2022-04-06 DIAGNOSIS — G9349 Other encephalopathy: Secondary | ICD-10-CM | POA: Diagnosis present

## 2022-04-06 DIAGNOSIS — Z515 Encounter for palliative care: Secondary | ICD-10-CM | POA: Diagnosis not present

## 2022-04-06 DIAGNOSIS — E870 Hyperosmolality and hypernatremia: Secondary | ICD-10-CM

## 2022-04-06 DIAGNOSIS — F29 Unspecified psychosis not due to a substance or known physiological condition: Secondary | ICD-10-CM | POA: Diagnosis not present

## 2022-04-06 DIAGNOSIS — E871 Hypo-osmolality and hyponatremia: Secondary | ICD-10-CM | POA: Diagnosis present

## 2022-04-06 DIAGNOSIS — E039 Hypothyroidism, unspecified: Secondary | ICD-10-CM | POA: Diagnosis present

## 2022-04-06 DIAGNOSIS — E538 Deficiency of other specified B group vitamins: Secondary | ICD-10-CM | POA: Diagnosis present

## 2022-04-06 DIAGNOSIS — E785 Hyperlipidemia, unspecified: Secondary | ICD-10-CM | POA: Diagnosis present

## 2022-04-06 DIAGNOSIS — R634 Abnormal weight loss: Secondary | ICD-10-CM

## 2022-04-06 DIAGNOSIS — Z66 Do not resuscitate: Secondary | ICD-10-CM | POA: Diagnosis present

## 2022-04-06 DIAGNOSIS — D696 Thrombocytopenia, unspecified: Secondary | ICD-10-CM | POA: Diagnosis present

## 2022-04-06 DIAGNOSIS — Z79890 Hormone replacement therapy (postmenopausal): Secondary | ICD-10-CM

## 2022-04-06 DIAGNOSIS — F323 Major depressive disorder, single episode, severe with psychotic features: Secondary | ICD-10-CM | POA: Diagnosis present

## 2022-04-06 DIAGNOSIS — R7401 Elevation of levels of liver transaminase levels: Secondary | ICD-10-CM | POA: Diagnosis not present

## 2022-04-06 DIAGNOSIS — G309 Alzheimer's disease, unspecified: Secondary | ICD-10-CM | POA: Diagnosis present

## 2022-04-06 DIAGNOSIS — E876 Hypokalemia: Secondary | ICD-10-CM | POA: Diagnosis present

## 2022-04-06 DIAGNOSIS — D539 Nutritional anemia, unspecified: Secondary | ICD-10-CM | POA: Diagnosis present

## 2022-04-06 DIAGNOSIS — R4182 Altered mental status, unspecified: Secondary | ICD-10-CM | POA: Diagnosis not present

## 2022-04-06 DIAGNOSIS — F419 Anxiety disorder, unspecified: Secondary | ICD-10-CM | POA: Diagnosis present

## 2022-04-06 DIAGNOSIS — I10 Essential (primary) hypertension: Secondary | ICD-10-CM | POA: Diagnosis present

## 2022-04-06 DIAGNOSIS — E860 Dehydration: Secondary | ICD-10-CM | POA: Diagnosis present

## 2022-04-06 DIAGNOSIS — E162 Hypoglycemia, unspecified: Secondary | ICD-10-CM | POA: Diagnosis present

## 2022-04-06 DIAGNOSIS — F02818 Dementia in other diseases classified elsewhere, unspecified severity, with other behavioral disturbance: Secondary | ICD-10-CM | POA: Diagnosis present

## 2022-04-06 DIAGNOSIS — Z8547 Personal history of malignant neoplasm of testis: Secondary | ICD-10-CM

## 2022-04-06 DIAGNOSIS — E43 Unspecified severe protein-calorie malnutrition: Secondary | ICD-10-CM | POA: Diagnosis present

## 2022-04-06 DIAGNOSIS — Z8249 Family history of ischemic heart disease and other diseases of the circulatory system: Secondary | ICD-10-CM

## 2022-04-06 DIAGNOSIS — Z801 Family history of malignant neoplasm of trachea, bronchus and lung: Secondary | ICD-10-CM

## 2022-04-06 DIAGNOSIS — Z789 Other specified health status: Secondary | ICD-10-CM | POA: Diagnosis not present

## 2022-04-06 DIAGNOSIS — F309 Manic episode, unspecified: Secondary | ICD-10-CM | POA: Diagnosis not present

## 2022-04-06 DIAGNOSIS — F0283 Dementia in other diseases classified elsewhere, unspecified severity, with mood disturbance: Secondary | ICD-10-CM | POA: Diagnosis present

## 2022-04-06 DIAGNOSIS — R64 Cachexia: Secondary | ICD-10-CM | POA: Diagnosis present

## 2022-04-06 DIAGNOSIS — Z7189 Other specified counseling: Secondary | ICD-10-CM | POA: Diagnosis not present

## 2022-04-06 DIAGNOSIS — G4730 Sleep apnea, unspecified: Secondary | ICD-10-CM | POA: Diagnosis present

## 2022-04-06 DIAGNOSIS — G912 (Idiopathic) normal pressure hydrocephalus: Secondary | ICD-10-CM | POA: Diagnosis present

## 2022-04-06 DIAGNOSIS — Z681 Body mass index (BMI) 19 or less, adult: Secondary | ICD-10-CM

## 2022-04-06 DIAGNOSIS — Z8546 Personal history of malignant neoplasm of prostate: Secondary | ICD-10-CM

## 2022-04-06 DIAGNOSIS — B359 Dermatophytosis, unspecified: Secondary | ICD-10-CM | POA: Diagnosis present

## 2022-04-06 DIAGNOSIS — G249 Dystonia, unspecified: Secondary | ICD-10-CM | POA: Diagnosis present

## 2022-04-06 DIAGNOSIS — R296 Repeated falls: Secondary | ICD-10-CM | POA: Diagnosis present

## 2022-04-06 DIAGNOSIS — N390 Urinary tract infection, site not specified: Secondary | ICD-10-CM | POA: Diagnosis present

## 2022-04-06 DIAGNOSIS — Z87891 Personal history of nicotine dependence: Secondary | ICD-10-CM

## 2022-04-06 DIAGNOSIS — Z79899 Other long term (current) drug therapy: Secondary | ICD-10-CM

## 2022-04-06 DIAGNOSIS — I160 Hypertensive urgency: Secondary | ICD-10-CM | POA: Diagnosis present

## 2022-04-06 DIAGNOSIS — R627 Adult failure to thrive: Secondary | ICD-10-CM | POA: Diagnosis present

## 2022-04-06 DIAGNOSIS — F0282 Dementia in other diseases classified elsewhere, unspecified severity, with psychotic disturbance: Secondary | ICD-10-CM | POA: Diagnosis present

## 2022-04-06 DIAGNOSIS — R7989 Other specified abnormal findings of blood chemistry: Secondary | ICD-10-CM

## 2022-04-06 DIAGNOSIS — R451 Restlessness and agitation: Secondary | ICD-10-CM | POA: Diagnosis present

## 2022-04-06 LAB — COMPREHENSIVE METABOLIC PANEL
ALT: 39 U/L (ref 0–44)
AST: 40 U/L (ref 15–41)
Albumin: 3.4 g/dL — ABNORMAL LOW (ref 3.5–5.0)
Alkaline Phosphatase: 79 U/L (ref 38–126)
BUN: 41 mg/dL — ABNORMAL HIGH (ref 8–23)
CO2: 28 mmol/L (ref 22–32)
Calcium: 9.2 mg/dL (ref 8.9–10.3)
Chloride: >130 mmol/L (ref 98–111)
Creatinine, Ser: 0.88 mg/dL (ref 0.61–1.24)
GFR, Estimated: >60 mL/min (ref >60)
Glucose, Bld: 102 mg/dL — ABNORMAL HIGH (ref 70–99)
Potassium: 4.3 mmol/L (ref 3.5–5.1)
Sodium: 160 mmol/L — ABNORMAL HIGH (ref 135–145)
Total Bilirubin: 0.9 mg/dL (ref 0.3–1.2)
Total Protein: 6.2 g/dL — ABNORMAL LOW (ref 6.5–8.1)

## 2022-04-06 LAB — CBC WITH DIFFERENTIAL/PLATELET
Abs Immature Granulocytes: 0.03 10*3/uL (ref 0.00–0.07)
Basophils Absolute: 0.1 10*3/uL (ref 0.0–0.1)
Basophils Relative: 2 %
Eosinophils Absolute: 0.1 10*3/uL (ref 0.0–0.5)
Eosinophils Relative: 4 %
HCT: 46.7 % (ref 39.0–52.0)
Hemoglobin: 14.3 g/dL (ref 13.0–17.0)
Immature Granulocytes: 1 %
Lymphocytes Relative: 32 %
Lymphs Abs: 1 10*3/uL (ref 0.7–4.0)
MCH: 33.2 pg (ref 26.0–34.0)
MCHC: 30.6 g/dL (ref 30.0–36.0)
MCV: 108.4 fL — ABNORMAL HIGH (ref 80.0–100.0)
Monocytes Absolute: 0.3 10*3/uL (ref 0.1–1.0)
Monocytes Relative: 11 %
Neutro Abs: 1.5 10*3/uL — ABNORMAL LOW (ref 1.7–7.7)
Neutrophils Relative %: 50 %
Platelets: 201 10*3/uL (ref 150–400)
RBC: 4.31 MIL/uL (ref 4.22–5.81)
RDW: 15.1 % (ref 11.5–15.5)
WBC: 3 10*3/uL — ABNORMAL LOW (ref 4.0–10.5)
nRBC: 0 % (ref 0.0–0.2)

## 2022-04-06 LAB — URINALYSIS, ROUTINE W REFLEX MICROSCOPIC
Bilirubin Urine: NEGATIVE
Glucose, UA: NEGATIVE mg/dL
Hgb urine dipstick: NEGATIVE
Ketones, ur: 5 mg/dL — AB
Nitrite: NEGATIVE
Protein, ur: 30 mg/dL — AB
Specific Gravity, Urine: 1.031 — ABNORMAL HIGH (ref 1.005–1.030)
WBC, UA: >50 WBC/hpf — ABNORMAL HIGH (ref 0–5)
pH: 5 (ref 5.0–8.0)

## 2022-04-06 LAB — CK: Total CK: 155 U/L (ref 49–397)

## 2022-04-06 LAB — AMMONIA: Ammonia: 33 umol/L (ref 9–35)

## 2022-04-06 LAB — TSH: TSH: 12.651 u[IU]/mL — ABNORMAL HIGH (ref 0.350–4.500)

## 2022-04-06 LAB — VALPROIC ACID LEVEL: Valproic Acid Lvl: 49 ug/mL — ABNORMAL LOW (ref 50.0–100.0)

## 2022-04-06 MED ORDER — MIRTAZAPINE 15 MG PO TBDP
15.0000 mg | ORAL_TABLET | Freq: Every day | ORAL | Status: DC
  Administered 2022-04-06 – 2022-04-12 (×7): 15 mg via ORAL
  Filled 2022-04-06 (×7): qty 1

## 2022-04-06 MED ORDER — ACETAMINOPHEN 325 MG PO TABS
650.0000 mg | ORAL_TABLET | Freq: Four times a day (QID) | ORAL | Status: DC | PRN
  Administered 2022-04-07 – 2022-04-12 (×2): 650 mg via ORAL
  Filled 2022-04-06 (×3): qty 2

## 2022-04-06 MED ORDER — ENOXAPARIN SODIUM 30 MG/0.3ML IJ SOSY
30.0000 mg | PREFILLED_SYRINGE | INTRAMUSCULAR | Status: DC
  Administered 2022-04-06 – 2022-04-12 (×7): 30 mg via SUBCUTANEOUS
  Filled 2022-04-06 (×7): qty 0.3

## 2022-04-06 MED ORDER — LEVOTHYROXINE SODIUM 100 MCG/5ML IV SOLN
37.5000 ug | Freq: Every day | INTRAVENOUS | Status: DC
  Filled 2022-04-06: qty 5

## 2022-04-06 MED ORDER — LORAZEPAM 0.5 MG PO TABS
0.5000 mg | ORAL_TABLET | Freq: Two times a day (BID) | ORAL | Status: DC | PRN
  Administered 2022-04-06 – 2022-04-12 (×6): 0.5 mg via ORAL
  Filled 2022-04-06 (×6): qty 1

## 2022-04-06 MED ORDER — OLANZAPINE 2.5 MG PO TABS
2.5000 mg | ORAL_TABLET | Freq: Every day | ORAL | Status: DC
  Filled 2022-04-06: qty 1

## 2022-04-06 MED ORDER — MEMANTINE HCL 10 MG PO TABS
10.0000 mg | ORAL_TABLET | Freq: Two times a day (BID) | ORAL | Status: DC
Start: 2022-04-06 — End: 2022-04-13
  Administered 2022-04-06 – 2022-04-12 (×13): 10 mg via ORAL
  Filled 2022-04-06 (×13): qty 1

## 2022-04-06 MED ORDER — BREXPIPRAZOLE 1 MG PO TABS
3.0000 mg | ORAL_TABLET | Freq: Every day | ORAL | Status: DC
  Administered 2022-04-07 – 2022-04-08 (×2): 3 mg via ORAL
  Filled 2022-04-06 (×2): qty 3

## 2022-04-06 MED ORDER — VALPROATE SODIUM 100 MG/ML IV SOLN
500.0000 mg | Freq: Two times a day (BID) | INTRAVENOUS | Status: DC
  Administered 2022-04-06 – 2022-04-09 (×7): 500 mg via INTRAVENOUS
  Filled 2022-04-06 (×8): qty 5

## 2022-04-06 MED ORDER — SODIUM CHLORIDE 0.9 % IV BOLUS
500.0000 mL | Freq: Once | INTRAVENOUS | Status: AC
  Administered 2022-04-06: 500 mL via INTRAVENOUS

## 2022-04-06 MED ORDER — DONEPEZIL HCL 10 MG PO TABS
10.0000 mg | ORAL_TABLET | Freq: Every day | ORAL | Status: DC
Start: 2022-04-06 — End: 2022-04-13
  Administered 2022-04-06 – 2022-04-12 (×7): 10 mg via ORAL
  Filled 2022-04-06 (×7): qty 1

## 2022-04-06 MED ORDER — ACETAMINOPHEN 650 MG RE SUPP: 650.0000 mg | Freq: Four times a day (QID) | RECTAL | Status: DC | PRN

## 2022-04-06 MED ORDER — DEXTROSE 5 % IV SOLN: INTRAVENOUS | Status: AC

## 2022-04-06 MED ORDER — HYDRALAZINE HCL 20 MG/ML IJ SOLN
5.0000 mg | INTRAMUSCULAR | Status: DC | PRN
  Administered 2022-04-06: 5 mg via INTRAVENOUS
  Filled 2022-04-06: qty 1

## 2022-04-06 NOTE — ED Notes (Signed)
Pt placed on a condom cath.

## 2022-04-06 NOTE — H&P (Signed)
History and Physical    [name] XIP:[id number] DOB: [birth year] DOA: 04/06/2022  PCP: Virgie Dad, MD  Patient coming from: Skilled nursing facility.  History obtained from patient's wife.  Chief Complaint: Using agitation.  HPI: [name] is a 76 y.o. male with known history of Alzheimer's dementia was brought to the ER after patient became increasingly agitated with poor oral intake.  Patient was brought to the ER about 2 weeks ago at [facility] at that time patient's medications were adjusted and patient was discharged back to skilled nursing facility.  Patient has been living at the skilled nursing facility for over a month now.  Patient has been progressively getting more agitated.  Over the last few days has not eaten well.  ED Course: The ER patient appears confused and lab work show hypernatremia.  UA shows possibility of UTI.  CT head shows progressive worsening of hydrocephalus.  Patient admitted for further work-up.  Review of Systems: As per HPI, rest all negative.   Past Medical History:  Diagnosis Date   Allergy    Alzheimer disease ([place]) 06/2018   diagnosed with early stage alzheimers   Arthritis    osteoarthritis   Cancer ([place])    testicular, age 44, orchiectomy 1992   Cataract    B/L CATARACTS NO SURGERY   Dementia ([place])    Depression    Dyslipidemia    Heartburn    TAKES ZANTAC   History of radiation therapy 12/02/18- 01/14/19   Head and neck/ base of tongue. 60 Gy over 30 fractions.    Hypertension    Hypogonadism male    Prostate cancer ([place]) 06/07/11   gleason 3+3=6, vol 59.5 cc   Sleep apnea    Status post chemotherapy    testicular cancer 1992, Dr Beryle Beams    Past Surgical History:  Procedure Laterality Date   APPENDECTOMY     COLONOSCOPY  08/07/2005   gessner   DIRECT LARYNGOSCOPY N/A 08/19/2018   Procedure: DIRECT LARYNGOSCOPY;  Surgeon: Leta Baptist, MD;  Location: [place];  Service: ENT;   Laterality: N/A;   INGUINAL HERNIA REPAIR     w/orchiectomy 1992, retroperitoneal lymph node dissection   IR GASTROSTOMY TUBE REMOVAL  02/28/2019   MENISECTOMY     R&L knee surgeries   PROSTATE SURGERY     biopsy x 2   SHX1  06/13/2021   right inferior mid medial external ear antehelix shave   shx1 Left 11/21/2021   epidermoid cyst inflamed and disrupted   SKIN BIOPSY Right 01/09/2022   chondrodermatitis nodularis helicis , 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   TONGUE BIOPSY N/A 08/19/2018   Procedure: BIOPSY OF TONGUE BASE MASS;  Surgeon: Leta Baptist, MD;  Location: [place];  Service: ENT;  Laterality: N/A;     reports that he quit smoking about 22 years ago. His smoking use included cigarettes. He has a 20.00 pack-year smoking history. He has never used smokeless tobacco. He reports that he does not currently use alcohol. He reports that he does not use drugs.  No Known Allergies  Family History  Problem Relation Age of Onset   Lung cancer Mother    Hypertension Mother    Lung cancer Father    Heart failure Father    Hypertension Father     Prior to Admission medications   Medication Sig Start Date End Date Taking? Authorizing Provider  acetaminophen (TYLENOL) 500 MG tablet Take 1,000 mg by  mouth 2 (two) times daily.    [provider]  acetaminophen (TYLENOL) 500 MG tablet Take 500 mg by mouth daily as needed.    [provider]  Brexpiprazole (REXULTI) 3 MG TABS Take 0.6667 tablets (2 mg total) by mouth daily. Patient taking differently: Take 3 mg by mouth daily. 04/05/22   Plovsky, Berneta Sages, MD  carboxymethylcellulose (REFRESH PLUS) 0.5 % SOLN 1 drop 3 (three) times daily as needed. One drop each eye 2-3 times daily    [provider]  cholecalciferol (VITAMIN D3) 25 MCG (1000 UNIT) tablet Take 1,000 Units by mouth daily.    [provider]  diclofenac Sodium (VOLTAREN ARTHRITIS PAIN) 1 % GEL Apply 2 g topically 3 (three) times daily.  To right knee, right hip    [provider]  divalproex (DEPAKOTE SPRINKLES) 125 MG capsule Take 4 capsules (500 mg total) by mouth 2 (two) times daily. Patient taking differently: 500 mg 2 (two) times daily. Mornings- open capsules and sprinkle in breakfast Evenings- open capsules  and sprinkle in dinner 03/26/22 04/25/22  Blake Divine, MD  donepezil (ARICEPT) 10 MG tablet TAKE 1 TABLET(10 MG) BY MOUTH AT BEDTIME 09/22/21   Tat, Eustace Quail, DO  famotidine (PEPCID) 10 MG tablet Take 10 mg by mouth in the morning.    [provider]  ipratropium (ATROVENT) 0.06 % nasal spray Place 2 sprays into both nostrils 2 (two) times daily as needed. 07/09/19   [provider]  lactose free nutrition (BOOST) LIQD Take 237 mLs by mouth 3 (three) times daily between meals.    [provider]  levothyroxine (SYNTHROID) 75 MCG tablet TAKE 1 TABLET(75 MCG) BY MOUTH DAILY 01/30/22   Denita Lung, MD  lidocaine (HM LIDOCAINE PATCH) 4 % Place 1 patch onto the skin daily. To right hip x 14 days 03/30/22 04/12/22  [provider]  LORazepam (ATIVAN) 0.5 MG tablet Take 1 tablet (0.5 mg total) by mouth in the morning, at noon, and at bedtime. Give at 8 am, 5 pm and 9 pm 04/03/22   Fargo, Amy E, NP  LORazepam (ATIVAN) 0.5 MG tablet Take 1 tablet (0.5 mg total) by mouth 2 (two) times daily as needed for anxiety. 04/03/22   Fargo, Amy E, NP  memantine (NAMENDA) 10 MG tablet Take 10 mg by mouth 2 (two) times daily.  Give crushed with breakfast/supper food    [provider]  mirtazapine (REMERON SOL-TAB) 15 MG disintegrating tablet Take 1 tablet (15 mg total) by mouth at bedtime. 03/26/22 04/25/22  Blake Divine, MD  OLANZapine (ZYPREXA) 2.5 MG tablet 1 qhs Patient taking differently: Take 2.5 mg by mouth daily. 04/05/22   Plovsky, Berneta Sages, MD  polyethylene glycol powder (GLYCOLAX/MIRALAX) powder Take 17 g by mouth daily. 08/24/14   Denita Lung, MD  primidone (MYSOLINE) 50 MG  tablet Take 1 tablet (50 mg total) by mouth 3 (three) times daily. 02/12/22   Rondel Jumbo, PA-C  rosuvastatin (CRESTOR) 20 MG tablet TAKE 1 TABLET(20 MG) BY MOUTH DAILY 12/26/21   Denita Lung, MD  sodium fluoride (PREVIDENT 5000 PLUS) 1.1 % CREA dental cream Apply cream to tooth brush. Brush teeth for 2 minutes. Spit out excess. DO NOT rinse afterwards. Repeat nightly. 09/17/18   Lenn Cal, DDS  terazosin (HYTRIN) 2 MG capsule TAKE 1 CAPSULE(2 MG) BY MOUTH DAILY 01/16/22   Denita Lung, MD  vortioxetine HBr (TRINTELLIX) 20 MG TABS tablet Take 1 tablet (20 mg total)  by mouth daily. 11/22/21   Plovsky, Berneta Sages, MD  zinc oxide 20 % ointment Apply 1 Application topically as needed for irritation.    [provider]    Physical Exam: Constitutional: Moderately built and nourished. Vitals:   04/06/22 1620 04/06/22 1621 04/06/22 1700 04/06/22 1900  BP:   118/67 110/83  Pulse:   83 82  Resp:   15 18  Temp: 97.8 F (36.6 C)     TempSrc: Axillary     SpO2:   100% 100%  Weight:  41.5 kg    Height:  '5\' 6"'$  (1.676 m)     Eyes: Anicteric no pallor. ENMT: No discharge from the ears eyes nose and mouth. Neck: No mass felt.  No neck rigidity. Respiratory: No rhonchi or crepitations. Cardiovascular: S1-S2 heard. Abdomen: Soft nontender bowel sound present.  Musculoskeletal: No edema. Skin: No rash. Neurologic: Alert awake but confused does not follow commands moving all extremities. Psychiatric: Confused.   Labs on Admission: I have personally reviewed following labs and imaging studies  CBC: Recent Labs  Lab 04/06/22 1730  WBC 3.0*  NEUTROABS 1.5*  HGB 14.3  HCT 46.7  MCV 108.4*  PLT 762   Basic Metabolic Panel: Recent Labs  Lab 04/06/22 1730  NA 160*  K 4.3  CL >130*  CO2 28  GLUCOSE 102*  BUN 41*  CREATININE 0.88  CALCIUM 9.2   GFR: Estimated Creatinine Clearance: 43.9 mL/min (by C-G formula based on SCr of 0.88 mg/dL). Liver Function Tests: Recent  Labs  Lab 04/06/22 1730  AST 40  ALT 39  ALKPHOS 79  BILITOT 0.9  PROT 6.2*  ALBUMIN 3.4*   No results for input(s): "LIPASE", "AMYLASE" in the last 168 hours. Recent Labs  Lab 04/06/22 1903  AMMONIA 33   Coagulation Profile: No results for input(s): "INR", "PROTIME" in the last 168 hours. Cardiac Enzymes: Recent Labs  Lab 04/06/22 1730  CKTOTAL 155   BNP (last 3 results) No results for input(s): "PROBNP" in the last 8760 hours. HbA1C: No results for input(s): "HGBA1C" in the last 72 hours. CBG: No results for input(s): "GLUCAP" in the last 168 hours. Lipid Profile: No results for input(s): "CHOL", "HDL", "LDLCALC", "TRIG", "CHOLHDL", "LDLDIRECT" in the last 72 hours. Thyroid Function Tests: Recent Labs    04/06/22 1735  TSH 12.651*   Anemia Panel: No results for input(s): "VITAMINB12", "FOLATE", "FERRITIN", "TIBC", "IRON", "RETICCTPCT" in the last 72 hours. Urine analysis:    Component Value Date/Time   COLORURINE YELLOW 01/30/2007 [place] 01/30/2007 0955   LABSPEC 1.020 10/10/2018 1237   PHURINE 6.0 01/30/2007 [place] 01/30/2007 0955   HGBUR NEGATIVE 01/30/2007 0955   BILIRUBINUR negative 10/10/2018 1237   BILIRUBINUR n 07/25/2012 1359   KETONESUR trace (5) (A) 10/10/2018 [place] 01/30/2007 0955   PROTEINUR negative 10/10/2018 1237   PROTEINUR n 07/25/2012 1359   PROTEINUR NEGATIVE 01/30/2007 0955   UROBILINOGEN negative 07/25/2012 1359   UROBILINOGEN 0.2 01/30/2007 0955   NITRITE Negative 10/10/2018 1237   NITRITE n 07/25/2012 1359   NITRITE NEGATIVE 01/30/2007 0955   LEUKOCYTESUR Negative 10/10/2018 1237   Sepsis Labs: '@LABRCNTIP'$ (procalcitonin:4,lacticidven:4) )No results found for this or any previous visit (from the past 240 hour(s)).   Radiological Exams on Admission: CT Head Wo Contrast  Result Date: 04/06/2022 CLINICAL DATA:  Mental status change. EXAM: CT HEAD WITHOUT CONTRAST  TECHNIQUE: Contiguous axial images were obtained from the base of the skull  through the vertex without intravenous contrast. RADIATION DOSE REDUCTION: This exam was performed according to the departmental dose-optimization program which includes automated exposure control, adjustment of the mA and/or kV according to patient size and/or use of iterative reconstruction technique. COMPARISON:  CT head 11/17/2021 FINDINGS: Brain: Generalized atrophy. 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 CSF collection lateral to the left cerebellum unchanged with mass-effect on the cerebellum. 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 This does not appear to be causing obstructive hydrocephalus. Negative for acute infarct, hemorrhage, mass Vascular: Negative for hyperdense vessel Skull: Negative Sinuses/Orbits: Paranasal sinuses clear.  Negative orbit Other: None IMPRESSION: Progressive enlargement of the ventricles since the CT of 11/17/2021 suggesting hydrocephalus. 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 Generalized atrophy.  No acute infarct or hemorrhage. Electronically Signed   By: Franchot Gallo M.D.   On: 04/06/2022 18:15   DG Chest Portable 1 View  Result Date: 04/06/2022 CLINICAL DATA:  Weakness. EXAM: PORTABLE CHEST 1 VIEW COMPARISON:  October 10, 2018. FINDINGS: The heart size and mediastinal contours are within normal limits. Both lungs are clear. The visualized skeletal structures are unremarkable. IMPRESSION: No active disease. Electronically Signed   By: Marijo Conception M.D.   On: 04/06/2022 18:15      Assessment/Plan Principal Problem:   Hypernatremia Active Problems:   Alzheimer's dementia with behavioral disturbance (HCC)    Severe hypernatremia -likely from poor oral intake.  We will keep patient on D5W and gently hydrate follow metabolic panel closely. Dementia with behavioral disturbances for which patient was recently had  his Depakote dose adjusted.  Was placed on Zyprexa to which we will continue.  Depakote has been dosed with IV until patient can reliably take p.o.  As needed Ativan.  Continue Rexulti.  Patient is also on Namenda and Aricept.  We will consult psychiatry.  I have discussed with on-call neurologist about patient's CT scan showing progressive hydrocephalus.  Awaiting further advice. Hypertensive urgency we will keep patient n.p.o. and IV hydralazine follow blood pressure trends continue Hytrin. History of hyperlipidemia continue statins once patient can take orally.   DVT prophylaxis: Lovenox. Code Status: No CPR or intubation.  Okay with medications. Family Communication: Patient's wife. Disposition Plan: To be determined. Consults called: Discussed with neurologist.  Consult psychiatry. Admission status: Observation.   Rise Patience MD [facility] Pager [phone].  If 7PM-7AM, please contact night-coverage [email or web address] Password [facility]  04/06/2022, 8:38 PM

## 2022-04-06 NOTE — ED Provider Notes (Addendum)
[facility] DEPT Provider Note   CSN: [id number] Arrival date & time: 04/06/22  1556     History  Chief Complaint  Patient presents with   Abnormal Lab    [name] is a 76 y.o. male.  Level 5 caveat for dementia.  [facility] from [facility] [place] with "declining mental status" since August 1.  He has a history of Alzheimer's dementia as well as depression and anxiety.  He has had recent adjustments of his Depakote.  Per his wife at bedside has been declining in terms of his mental status with increased confusion and behavior change for the past month.  Labs today showed a sodium of 161.  Wife reports he has been eating and drinking well up until today.  No recent vomiting or diarrhea.  No known fever.  Patient unable to give any history.  No known trauma. He was seen in outside hospital on August 18 and had medication adjustments due to his behavior. He has been agitated, screaming and combative and [facility] psychiat[facility] admission was recommended though patient was sent back to his facility  The history is provided by the patient and a caregiver. The history is limited by the condition of the patient.  Abnormal Lab      Home Medications Prior to Admission medications   Medication Sig Start Date End Date Taking? Authorizing Provider  acetaminophen (TYLENOL) 500 MG tablet Take 1,000 mg by mouth 2 (two) times daily.    [provider]  acetaminophen (TYLENOL) 500 MG tablet Take 500 mg by mouth daily as needed.    [provider]  Brexpiprazole (REXULTI) 3 MG TABS Take 0.6667 tablets (2 mg total) by mouth daily. Patient taking differently: Take 3 mg by mouth daily. 04/05/22   Plovsky, Berneta Sages, MD  carboxymethylcellulose (REFRESH PLUS) 0.5 % SOLN 1 drop 3 (three) times daily as needed. One drop each eye 2-3 times daily    [provider]  cholecalciferol (VITAMIN D3) 25 MCG (1000 UNIT) tablet Take 1,000 Units by mouth daily.     [provider]  diclofenac Sodium (VOLTAREN ARTHRITIS PAIN) 1 % GEL Apply 2 g topically 3 (three) times daily. To right knee, right hip    [provider]  divalproex (DEPAKOTE SPRINKLES) 125 MG capsule Take 4 capsules (500 mg total) by mouth 2 (two) times daily. Patient taking differently: 500 mg 2 (two) times daily. Mornings- open capsules and sprinkle in breakfast Evenings- open capsules  and sprinkle in dinner 03/26/22 04/25/22  Blake Divine, MD  donepezil (ARICEPT) 10 MG tablet TAKE 1 TABLET(10 MG) BY MOUTH AT BEDTIME 09/22/21   Tat, Eustace Quail, DO  famotidine (PEPCID) 10 MG tablet Take 10 mg by mouth in the morning.    [provider]  ipratropium (ATROVENT) 0.06 % nasal spray Place 2 sprays into both nostrils 2 (two) times daily as needed. 07/09/19   [provider]  lactose free nutrition (BOOST) LIQD Take 237 mLs by mouth 3 (three) times daily between meals.    [provider]  levothyroxine (SYNTHROID) 75 MCG tablet TAKE 1 TABLET(75 MCG) BY MOUTH DAILY 01/30/22   Denita Lung, MD  lidocaine (HM LIDOCAINE PATCH) 4 % Place 1 patch onto the skin daily. To right hip x 14 days 03/30/22 04/12/22  [provider]  LORazepam (ATIVAN) 0.5 MG tablet Take 1 tablet (0.5 mg total) by mouth in the morning, at noon, and at bedtime. Give at 8 am, 5 pm and  9 pm 04/03/22   Fargo, Amy E, NP  LORazepam (ATIVAN) 0.5 MG tablet Take 1 tablet (0.5 mg total) by mouth 2 (two) times daily as needed for anxiety. 04/03/22   Fargo, Amy E, NP  memantine (NAMENDA) 10 MG tablet Take 10 mg by mouth 2 (two) times daily.  Give crushed with breakfast/supper food    [provider]  mirtazapine (REMERON SOL-TAB) 15 MG disintegrating tablet Take 1 tablet (15 mg total) by mouth at bedtime. 03/26/22 04/25/22  Blake Divine, MD  OLANZapine (ZYPREXA) 2.5 MG tablet 1 qhs Patient taking differently: Take 2.5 mg by mouth daily. 04/05/22   Plovsky, Berneta Sages, MD  polyethylene  glycol powder (GLYCOLAX/MIRALAX) powder Take 17 g by mouth daily. 08/24/14   Denita Lung, MD  primidone (MYSOLINE) 50 MG tablet Take 1 tablet (50 mg total) by mouth 3 (three) times daily. 02/12/22   Rondel Jumbo, PA-C  rosuvastatin (CRESTOR) 20 MG tablet TAKE 1 TABLET(20 MG) BY MOUTH DAILY 12/26/21   Denita Lung, MD  sodium fluoride (PREVIDENT 5000 PLUS) 1.1 % CREA dental cream Apply cream to tooth brush. Brush teeth for 2 minutes. Spit out excess. DO NOT rinse afterwards. Repeat nightly. 09/17/18   Lenn Cal, DDS  terazosin (HYTRIN) 2 MG capsule TAKE 1 CAPSULE(2 MG) BY MOUTH DAILY 01/16/22   Denita Lung, MD  vortioxetine HBr (TRINTELLIX) 20 MG TABS tablet Take 1 tablet (20 mg total) by mouth daily. 11/22/21   Plovsky, Berneta Sages, MD  zinc oxide 20 % ointment Apply 1 Application topically as needed for irritation.    [provider]      Allergies    Patient has no known allergies.    Review of Systems   Review of Systems  Unable to perform ROS: Dementia    Physical Exam Updated Vital Signs BP 109/77   Pulse 86   Temp 97.8 F (36.6 C) (Axillary)   Resp 17   Ht '5\' 6"'$  (1.676 m)   Wt 41.5 kg   SpO2 99%   BMI 14.77 kg/m  Physical Exam Vitals and nursing note reviewed.  Constitutional:      General: He is not in acute distress.    Appearance: He is well-developed.     Comments: Confused, agitated, unable to give a history  HENT:     Head: Normocephalic and atraumatic.     Mouth/Throat:     Pharynx: No oropharyngeal exudate.  Eyes:     Conjunctiva/sclera: Conjunctivae normal.     Pupils: Pupils are equal, round, and reactive to light.  Neck:     Comments: No meningismus. Cardiovascular:     Rate and Rhythm: Normal rate and regular rhythm.     Heart sounds: Normal heart sounds. No murmur heard. Pulmonary:     Effort: Pulmonary effort is normal. No respiratory distress.     Breath sounds: Normal breath sounds.  Chest:     Chest wall: No tenderness.   Abdominal:     Palpations: Abdomen is soft.     Tenderness: There is no abdominal tenderness. There is no guarding or rebound.  Musculoskeletal:        General: No tenderness. Normal range of motion.     Cervical back: Normal range of motion and neck supple.  Skin:    General: Skin is warm.  Neurological:     Mental Status: He is alert.     Motor: No abnormal muscle tone.     Comments: Intermittently combative, moves  all extremities.  Oriented to "[facility]".  Psychiatric:        Behavior: Behavior normal.     ED Results / Procedures / Treatments   Labs (all labs ordered are listed, but only abnormal results are displayed) Labs Reviewed  CBC WITH DIFFERENTIAL/PLATELET - Abnormal; Notable for the following components:      Result Value   WBC 3.0 (*)    MCV 108.4 (*)    Neutro Abs 1.5 (*)    All other components within normal limits  COMPREHENSIVE METABOLIC PANEL - Abnormal; Notable for the following components:   Sodium 160 (*)    Chloride >130 (*)    Glucose, Bld 102 (*)    BUN 41 (*)    Total Protein 6.2 (*)    Albumin 3.4 (*)    All other components within normal limits  URINALYSIS, ROUTINE W REFLEX MICROSCOPIC - Abnormal; Notable for the following components:   Color, Urine AMBER (*)    APPearance CLOUDY (*)    Specific Gravity, Urine 1.031 (*)    Ketones, ur 5 (*)    Protein, ur 30 (*)    Leukocytes,Ua MODERATE (*)    WBC, UA >50 (*)    Bacteria, UA RARE (*)    All other components within normal limits  TSH - Abnormal; Notable for the following components:   TSH 12.651 (*)    All other components within normal limits  VALPROIC ACID LEVEL - Abnormal; Notable for the following components:   Valproic Acid Lvl 49 (*)    All other components within normal limits  GLUCOSE, CAPILLARY - Abnormal; Notable for the following components:   Glucose-Capillary 107 (*)    All other components within normal limits  CK  AMMONIA  BASIC METABOLIC PANEL  BASIC METABOLIC  PANEL  BASIC METABOLIC PANEL  BASIC METABOLIC PANEL  CBC WITH DIFFERENTIAL/PLATELET    EKG None  Radiology CT Head Wo Contrast  Result Date: 04/06/2022 CLINICAL DATA:  Mental status change. EXAM: CT HEAD WITHOUT CONTRAST TECHNIQUE: Contiguous axial images were obtained from the base of the skull through the vertex without intravenous contrast. RADIATION DOSE REDUCTION: This exam was performed according to the departmental dose-optimization program which includes automated exposure control, adjustment of the mA and/or kV according to patient size and/or use of iterative reconstruction technique. COMPARISON:  CT head 11/17/2021 FINDINGS: Brain: Generalized atrophy. 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 CSF collection lateral to the left cerebellum unchanged with mass-effect on the cerebellum. 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 This does not appear to be causing obstructive hydrocephalus. Negative for acute infarct, hemorrhage, mass Vascular: Negative for hyperdense vessel Skull: Negative Sinuses/Orbits: Paranasal sinuses clear.  Negative orbit Other: None IMPRESSION: Progressive enlargement of the ventricles since the CT of 11/17/2021 suggesting hydrocephalus. 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 Generalized atrophy.  No acute infarct or hemorrhage. Electronically Signed   By: Franchot Gallo M.D.   On: 04/06/2022 18:15   DG Chest Portable 1 View  Result Date: 04/06/2022 CLINICAL DATA:  Weakness. EXAM: PORTABLE CHEST 1 VIEW COMPARISON:  October 10, 2018. FINDINGS: The heart size and mediastinal contours are within normal limits. Both lungs are clear. The visualized skeletal structures are unremarkable. IMPRESSION: No active disease. Electronically Signed   By: Marijo Conception M.D.   On: 04/06/2022 18:15    Procedures Procedures    Medications Ordered in ED Medications  sodium chloride 0.9 % bolus 500 mL (has  no  administration in time range)    ED Course/ Medical Decision Making/ A&P                           Medical Decision Making Amount and/or Complexity of Data Reviewed Labs: ordered. Decision-making details documented in ED Course. Radiology: ordered and independent interpretation performed. Decision-making details documented in ED Course. ECG/medicine tests: ordered and independent interpretation performed. Decision-making details documented in ED Course.  Risk Decision regarding hospitalization.  Patient sent from facility with behavior change and declining mental status.  Found to have hyponatremia.  Stable vitals, no fever.  Labs confirm hyponatremia of 160, chloride greater than 130.  Creatinine is at baseline.  Urinalysis is pending.  Chest x-ray is clear.  CT head shows enlarged ventricles progressed from April.  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  In the agitation and screaming behavior in the ED. Given his hyper natremia he will require medical admission prior to any psychiatry evaluation  Discussed with Dr. Hal Hope       Final Clinical Impression(s) / ED Diagnoses Final diagnoses:  Hypernatremia  Altered mental status, unspecified altered mental status type    Rx / DC Orders ED Discharge Orders     None         Evrett Hakim, Annie Main, MD 04/06/22 1933    Ezequiel Essex, MD 04/07/22 [phone]

## 2022-04-06 NOTE — ED Triage Notes (Signed)
Pt from [facility] via EMS with c/o an elevated sodium level of 161. Pt at baseline.   Hx of dementia with aggressive behavior.

## 2022-04-06 NOTE — Progress Notes (Signed)
Location:    Nursing Home Room Number: SNF N30 Place of Service:  SNF (31) Provider:    Virgie Dad, MD  Patient Care Team: Virgie Dad, MD as PCP - General (Internal Medicine) Francina Ames, MD as Referring Physician (Otolaryngology) Eppie Gibson, MD as Attending Physician (Radiation Oncology) Leota Sauers, RN (Inactive) as Oncology Nurse Navigator Lenn Cal, DDS (Inactive) as Consulting Physician (Dentistry) Tat, Eustace Quail, DO as Consulting Physician (Neurology) Alla Feeling, NP as Nurse Practitioner (Nurse Practitioner)  Extended Emergency Contact Information Primary Emergency Contact: Somes,[name] Address: [address]          [place], [place] [address] Johnnette Litter of [place] Phone: [phone] Mobile Phone: [phone] Relation: Spouse  Code Status:  DNR Goals of care: Advanced Directive information    04/06/2022   12:25 PM  Advanced Directives  Does Patient Have a Medical Advance Directive? Yes  Type of Paramedic of [place];Out of facility DNR (pink MOST or yellow form)  Does patient want to make changes to medical advance directive? No - Patient declined  Copy of [place] in Chart? Yes - validated most recent copy scanned in chart (See row information)     Chief Complaint  Patient presents with   Acute Visit  Behaviors  HPI:  Pt is a 76 y.o. 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 Now on surveillance     Since patient has been in the facility he has been very agitated hitting the staff states  spitting, screaming.  History of being urinating in hallways.Pounding at the walls Patient was sent to ED 2 weeks ago as nurses were unable to take care of him.  Dr. Casimiro Needle who is a his Geri psych  recommended admission but was denied because patient was nonambulatory. [facility] on Zyprexa and the Depakote was increased Patient continues to have severe agitations and behavior issues He has lost 20 pounds in past 4 weeks When I went to see the patient he was Lethargic as he has gotten Zyprexa and Ativan but then he started screaming again and was hitting me also  Labs were drawn today and his sodium is 161 I have talked many times to the wife. She wants him Full code with all possible intervention    Past Medical History:  Diagnosis Date   Allergy    Alzheimer disease ([place]) 06/2018   diagnosed with early stage alzheimers   Arthritis    osteoarthritis   Cancer ([place])    testicular, age 42, orchiectomy 1992   Cataract    B/L CATARACTS NO SURGERY   Dementia ([place])    Depression    Dyslipidemia    Heartburn    TAKES ZANTAC   History of radiation therapy 12/02/18- 01/14/19   Head and neck/ base of tongue. 60 Gy over 30 fractions.    Hypertension    Hypogonadism male    Prostate cancer ([place]) 06/07/11   gleason 3+3=6, vol 59.5 cc   Sleep apnea    Status post chemotherapy    testicular cancer 1992,  Dr Beryle Beams   Past Surgical History:  Procedure Laterality Date   APPENDECTOMY     COLONOSCOPY  08/07/2005   [facility]   DIRECT LARYNGOSCOPY N/A 08/19/2018   Procedure: DIRECT LARYNGOSCOPY;  Surgeon: Leta Baptist, MD;  Location: [facility];  Service: ENT;  Laterality: N/A;   INGUINAL HERNIA REPAIR     w/orchiectomy 1992, retroperitoneal lymph node dissection   IR GASTROSTOMY TUBE REMOVAL  02/28/2019   MENISECTOMY     R&L knee surgeries   PROSTATE SURGERY     biopsy x 2   [facility]  06/13/2021   right inferior mid medial external ear antehelix shave    shx1 Left 11/21/2021   epidermoid cyst inflamed and disrupted   SKIN BIOPSY Right 01/09/2022   chondrodermatitis nodularis helicis , 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 If you have any questions, ask your nurse or doctor.          acetaminophen 500 MG tablet Commonly known as: TYLENOL Take 1,000 mg by mouth 2 (two) times daily.   acetaminophen 500 MG tablet Commonly known as: TYLENOL Take 500 mg by mouth daily as needed.   Brexpiprazole 3 MG Tabs Commonly known as: Rexulti Take 0.6667 tablets (2 mg total) by mouth daily. What changed: how much to take   carboxymethylcellulose 0.5 % Soln Commonly known as: REFRESH PLUS 1 drop 3 (three) times daily as needed. One drop each eye 2-3 times daily   cholecalciferol 25 MCG (1000 UNIT) tablet Commonly known as: VITAMIN D3 Take 1,000 Units by mouth daily.   divalproex 125 MG capsule Commonly known as: Depakote Sprinkles Take 4 capsules (500 mg total) by mouth 2 (two) times daily. What changed:  how to take this additional instructions   donepezil 10 MG tablet Commonly known as: ARICEPT TAKE 1 TABLET(10 MG) BY MOUTH AT BEDTIME   famotidine 10 MG tablet Commonly known as: PEPCID Take 10 mg by mouth in the morning.   HM Lidocaine Patch 4 % Generic drug: lidocaine Place 1 patch onto the skin daily. To right hip x 14 days   ipratropium 0.06 % nasal spray Commonly known as: ATROVENT Place 2 sprays into both nostrils 2 (two) times daily as needed.   lactose free nutrition Liqd Take 237 mLs by mouth 3 (three) times daily between meals.   levothyroxine 75 MCG tablet Commonly known as: SYNTHROID TAKE 1 TABLET(75 MCG) BY  MOUTH DAILY   LORazepam 0.5 MG tablet Commonly known as: ATIVAN Take 1 tablet (0.5 mg total) by mouth in the morning, at noon, and at bedtime. Give at 8 am, 5 pm and 9 pm   LORazepam 0.5 MG tablet Commonly known as: ATIVAN Take 1 tablet (0.5 mg total) by mouth 2 (two) times daily as needed for anxiety.   memantine 10 MG tablet Commonly known as: NAMENDA Take 10 mg by mouth 2 (two) times daily.  Give crushed with breakfast/supper food   mirtazapine 15 MG disintegrating tablet Commonly known as: REMERON SOL-TAB Take 1 tablet (15 mg total) by mouth at bedtime.   OLANZapine 2.5 MG tablet Commonly known as: ZyPREXA  1 qhs What changed:  how much to take how to take this when to take this additional instructions   polyethylene glycol powder 17 GM/SCOOP powder Commonly known as: GLYCOLAX/MIRALAX Take 17 g by mouth daily.   primidone 50 MG tablet Commonly known as: MYSOLINE Take 1 tablet (50 mg total) by mouth 3 (three) times daily.   rosuvastatin 20 MG tablet Commonly known as: CRESTOR TAKE 1 TABLET(20 MG) BY MOUTH DAILY   sodium fluoride 1.1 % Crea dental cream Commonly known as: PreviDent 5000 Plus Apply cream to tooth brush. Brush teeth for 2 minutes. Spit out excess. DO NOT rinse afterwards. Repeat nightly.   terazosin 2 MG capsule Commonly known as: HYTRIN TAKE 1 CAPSULE(2 MG) BY MOUTH DAILY   Voltaren Arthritis Pain 1 % Gel Generic drug: diclofenac Sodium Apply 2 g topically 3 (three) times daily. To right knee, right hip   vortioxetine HBr 20 MG Tabs tablet Commonly known as: Trintellix Take 1 tablet (20 mg total) by mouth daily.   zinc oxide 20 % ointment Apply 1 Application topically as needed for irritation.        Review of Systems  Unable to perform ROS: Dementia    Immunization History  Administered Date(s) Administered   Influenza Split 04/18/2012, 05/06/2013, 05/27/2015   Influenza Whole 04/12/2011   Influenza, High Dose Seasonal PF  05/21/2017, 05/16/2018, 04/11/2019, 05/09/2020   Influenza-Unspecified 04/23/2014, 05/18/2016, 05/21/2017, 05/16/2018, 05/09/2020, 04/19/2021   PFIZER Comirnaty(Gray Top)Covid-19 Tri-Sucrose Vaccine 11/16/2020   PFIZER(Purple Top)SARS-COV-2 Vaccination 09/19/2019, 10/03/2019, 04/06/2020   Pneumococcal Conjugate-13 08/31/2015   Pneumococcal Polysaccharide-23 12/09/2009   Tdap 01/17/2000, 07/25/2012   Zoster Recombinat (Shingrix) 05/04/2021, 11/01/2021   Zoster, Live 12/08/2008   Pertinent  Health Maintenance Due  Topic Date Due   INFLUENZA VACCINE  03/07/2022      03/24/2022    1:45 PM 03/24/2022    2:12 PM 03/25/2022   11:33 AM 03/25/2022   11:36 PM 03/26/2022    7:14 AM  Fall Risk  Patient Fall Risk Level High fall risk High fall risk Moderate fall risk High fall risk High fall risk   Functional Status Survey:    Vitals:   04/06/22 1134  BP: 134/78  Pulse: 68  Resp: 16  Temp: (!) 96.8 F (36 C)  TempSrc: Skin  SpO2: 98%  Weight: 91 lb 8 oz (41.5 kg)  Height: '5\' 6"'$  (1.676 m)   Body mass index is 14.77 kg/m. Physical Exam Vitals reviewed.  Constitutional:      Comments: Eyes Shut and he is screaming Tries to hit the staff  HENT:     Head: Normocephalic.     Nose: Nose normal.     Mouth/Throat:     Mouth: Mucous membranes are moist.     Pharynx: Oropharynx is clear.  Eyes:     Pupils: Pupils are equal, round, and reactive to light.  Cardiovascular:     Rate and Rhythm: Normal rate and regular rhythm.     Pulses: Normal pulses.     Heart sounds: No murmur heard. Pulmonary:     Effort: Pulmonary effort is normal. No respiratory distress.     Breath sounds: Normal breath sounds. No rales.  Abdominal:     General: Abdomen is flat. Bowel sounds are normal.     Palpations: Abdomen is soft.  Musculoskeletal:        General: No swelling.     Cervical back: Neck supple.  Skin:    General: Skin is warm.  Neurological:     General: No focal deficit present.   Psychiatric:        Mood and Affect: Mood normal.        Thought Content: Thought content normal.     Labs reviewed: Recent Labs    11/23/21 1526 01/19/22 0734 03/09/22 0000 03/24/22 1409  NA 140 143 143 147*  K 4.4 4.3 3.6 4.6  CL 103 104 104 111  CO2 25 22 30* 27  GLUCOSE 97 87  --  93  BUN 15 12 37* 25*  CREATININE 0.89 0.90 1.0 0.71  CALCIUM 9.5 9.2 9.6 9.2   Recent Labs    11/23/21 1526 01/19/22 0734 03/09/22 0000 03/24/22 1409  AST 20 31 44* 48*  ALT 21 36 37 42  ALKPHOS 68 54 52 73  BILITOT 0.4 0.4  --  0.6  PROT 6.4 6.3  --  6.4*  ALBUMIN 4.6 4.4 4.4 4.0   Recent Labs    11/23/21 1526 01/19/22 0734 03/09/22 0000 03/24/22 1409  WBC 3.1* 2.5* 3.5 3.2*  NEUTROABS 2.1 1.3*  --   --   HGB 13.5 15.1 13.8 13.9  HCT 40.3 42.1 40* 42.2  MCV 92 91  --  97.9  PLT 176 169 155 173   Lab Results  Component Value Date   TSH 3.110 11/23/2021   No results found for: "HGBA1C" Lab Results  Component Value Date   CHOL 110 11/23/2021   HDL 49 11/23/2021   LDLCALC 43 11/23/2021   TRIG 93 11/23/2021   CHOLHDL 2.2 11/23/2021    Significant Diagnostic Results in last 30 days:  No results found.  Assessment/Plan Patient now has Hypernatremia,Sodium  161 Weight loss Severe Agitation  Discussed with Dr Casimiro Needle Nurses and wife Will send him to ED After Medically stabilized he has to be admitted in [facility] unit for further care    Family/ staff Communication:   Labs/tests ordered:    Total time spent in this patient care encounter was  45_  minutes; greater than 50% of the visit spent counseling patient and staff, reviewing records , Labs and coordinating care for problems addressed at this encounter.

## 2022-04-07 DIAGNOSIS — E870 Hyperosmolality and hypernatremia: Secondary | ICD-10-CM | POA: Diagnosis not present

## 2022-04-07 LAB — BASIC METABOLIC PANEL
Anion gap: 6 (ref 5–15)
Anion gap: 5 (ref 5–15)
Anion gap: 5 (ref 5–15)
Anion gap: 6 (ref 5–15)
BUN: 36 mg/dL — ABNORMAL HIGH (ref 8–23)
BUN: 35 mg/dL — ABNORMAL HIGH (ref 8–23)
BUN: 32 mg/dL — ABNORMAL HIGH (ref 8–23)
BUN: 29 mg/dL — ABNORMAL HIGH (ref 8–23)
CO2: 28 mmol/L (ref 22–32)
CO2: 28 mmol/L (ref 22–32)
CO2: 26 mmol/L (ref 22–32)
CO2: 24 mmol/L (ref 22–32)
Calcium: 9.1 mg/dL (ref 8.9–10.3)
Calcium: 9.1 mg/dL (ref 8.9–10.3)
Calcium: 8.6 mg/dL — ABNORMAL LOW (ref 8.9–10.3)
Calcium: 8.6 mg/dL — ABNORMAL LOW (ref 8.9–10.3)
Chloride: 125 mmol/L — ABNORMAL HIGH (ref 98–111)
Chloride: 125 mmol/L — ABNORMAL HIGH (ref 98–111)
Chloride: 121 mmol/L — ABNORMAL HIGH (ref 98–111)
Chloride: 121 mmol/L — ABNORMAL HIGH (ref 98–111)
Creatinine, Ser: 0.73 mg/dL (ref 0.61–1.24)
Creatinine, Ser: 0.66 mg/dL (ref 0.61–1.24)
Creatinine, Ser: 0.64 mg/dL (ref 0.61–1.24)
Creatinine, Ser: 0.56 mg/dL — ABNORMAL LOW (ref 0.61–1.24)
GFR, Estimated: >60 mL/min (ref >60)
GFR, Estimated: >60 mL/min (ref >60)
GFR, Estimated: >60 mL/min (ref >60)
GFR, Estimated: >60 mL/min (ref >60)
Glucose, Bld: 110 mg/dL — ABNORMAL HIGH (ref 70–99)
Glucose, Bld: 115 mg/dL — ABNORMAL HIGH (ref 70–99)
Glucose, Bld: 97 mg/dL (ref 70–99)
Glucose, Bld: 98 mg/dL (ref 70–99)
Potassium: 3.8 mmol/L (ref 3.5–5.1)
Potassium: 4 mmol/L (ref 3.5–5.1)
Potassium: 4.3 mmol/L (ref 3.5–5.1)
Potassium: 3.4 mmol/L — ABNORMAL LOW (ref 3.5–5.1)
Sodium: 159 mmol/L — ABNORMAL HIGH (ref 135–145)
Sodium: 158 mmol/L — ABNORMAL HIGH (ref 135–145)
Sodium: 152 mmol/L — ABNORMAL HIGH (ref 135–145)
Sodium: 151 mmol/L — ABNORMAL HIGH (ref 135–145)

## 2022-04-07 LAB — CBC WITH DIFFERENTIAL/PLATELET
Abs Immature Granulocytes: 0.04 10*3/uL (ref 0.00–0.07)
Basophils Absolute: 0 10*3/uL (ref 0.0–0.1)
Basophils Relative: 1 %
Eosinophils Absolute: 0.1 10*3/uL (ref 0.0–0.5)
Eosinophils Relative: 3 %
HCT: 41.6 % (ref 39.0–52.0)
Hemoglobin: 13.5 g/dL (ref 13.0–17.0)
Immature Granulocytes: 1 %
Lymphocytes Relative: 23 %
Lymphs Abs: 0.8 10*3/uL (ref 0.7–4.0)
MCH: 33.2 pg (ref 26.0–34.0)
MCHC: 32.5 g/dL (ref 30.0–36.0)
MCV: 102.2 fL — ABNORMAL HIGH (ref 80.0–100.0)
Monocytes Absolute: 0.4 10*3/uL (ref 0.1–1.0)
Monocytes Relative: 11 %
Neutro Abs: 2.1 10*3/uL (ref 1.7–7.7)
Neutrophils Relative %: 61 %
Platelets: 208 10*3/uL (ref 150–400)
RBC: 4.07 MIL/uL — ABNORMAL LOW (ref 4.22–5.81)
RDW: 15.1 % (ref 11.5–15.5)
WBC: 3.4 10*3/uL — ABNORMAL LOW (ref 4.0–10.5)
nRBC: 0 % (ref 0.0–0.2)

## 2022-04-07 LAB — GLUCOSE, CAPILLARY
Glucose-Capillary: 105 mg/dL — ABNORMAL HIGH (ref 70–99)
Glucose-Capillary: 107 mg/dL — ABNORMAL HIGH (ref 70–99)
Glucose-Capillary: 114 mg/dL — ABNORMAL HIGH (ref 70–99)
Glucose-Capillary: 92 mg/dL (ref 70–99)

## 2022-04-07 MED ORDER — LEVOTHYROXINE SODIUM 100 MCG/5ML IV SOLN: 37.5000 ug | Freq: Every day | INTRAVENOUS | Status: DC

## 2022-04-07 MED ORDER — OLANZAPINE 2.5 MG PO TABS
2.5000 mg | ORAL_TABLET | Freq: Every day | ORAL | Status: DC
  Administered 2022-04-07 (×2): 2.5 mg via ORAL
  Filled 2022-04-07 (×2): qty 1

## 2022-04-07 NOTE — TOC Initial Note (Signed)
Transition of Care [facility]) - Initial/Assessment Note   Patient Details  Name: KALIEB FREELAND MRN: [id number] Date of Birth: 06-Nov-1945  Transition of [facility]) CM/SW Contact:    Sherie Don, LCSW Phone Number: 04/07/2022, 2:59 PM  Clinical Narrative: [facility] consulted as patient came from [place] met with patient and wife. Per wife, the patient's psychiatrist, Dr. Casimiro Needle [phone]), and the medical director of [facility], Dr. Lyndel Safe, were planning for patient to be sent to [facility] at discharge rather than back to SNF. CSW followed up with Jordan in admissions at [facility] and it was confirmed that the facility is not taking him back until after he goes to [facility] due to his aggressive behaviors. CSW notified hospitalist and [facility] supervisor regarding the facility's and OP psychiatrist's expectations for the discharge plan.  Expected Discharge Plan: [place] Barriers to Discharge: Continued Medical Work up  Patient Goals and CMS Choice Patient states their goals for this hospitalization and ongoing recovery are:: Go to [facility].gov Compare Post Acute Care list provided to:: Patient Represenative (must comment) Choice offered to / list presented to : Spouse  Expected Discharge Plan and Services Expected Discharge Plan: [place] In-house Referral: Clinical Social Work Post Acute Care Choice: [place] Living arrangements for the past 2 months: [place]           DME Arranged: N/A DME Agency: NA  Prior Living Arrangements/Services Living arrangements for the past 2 months: [place] Lives with:: Facility Resident Patient language and need for interpreter reviewed:: Yes Need for Family Participation in Patient Care: Yes (Comment) (Patient has dementia.) Care giver support system in place?: Yes (comment) Criminal Activity/Legal Involvement Pertinent to Current  Situation/Hospitalization: No - Comment as needed  Permission Sought/Granted Permission sought to share information with : Facility Sport and exercise psychologist, Other (comment) Share Information with NAME: Dr. Casimiro Needle [phone]) Permission granted to share info w AGENCY: [facility]  Emotional Assessment Appearance:: Appears stated age Affect (typically observed): Agitated Orientation: : Oriented to Self Alcohol / Substance Use: Not Applicable Psych Involvement: Yes (comment) (Dr. Casimiro Needle)  Admission diagnosis:  Hypernatremia [E87.0] Altered mental status, unspecified altered mental status type [R41.82] Patient Active Problem List   Diagnosis Date Noted   Hypernatremia 04/06/2022   Aggressive behavior    Slow transit constipation 03/23/2022   Gait abnormality 03/23/2022   Malignant neoplasm of base of tongue ([place]) 09/27/2018   Alzheimer's dementia with behavioral disturbance ([place]) 09/11/2016   Essential tremor 09/11/2016   Depression, psychotic ([place]) 10/19/2014   PCP:  Virgie Dad, MD Pharmacy:   [place], [place] [facility] [address] [place] [address] Phone: [phone] Fax: [phone]  Readmission Risk Interventions     No data to display

## 2022-04-07 NOTE — Progress Notes (Signed)
Pt has been awake all night despite medications given to help relax and sleep. Pt has been screaming and restless. Will continue to monitor.

## 2022-04-07 NOTE — Plan of Care (Signed)
  Problem: Education: Goal: Knowledge of General Education information will improve Description: Including pain rating scale, medication(s)/side effects and non-pharmacologic comfort measures Outcome: Not Progressing   

## 2022-04-07 NOTE — Progress Notes (Signed)
Patient's oxygen was low 88% in room air. Started oxygen in 2L [place].

## 2022-04-07 NOTE — Progress Notes (Signed)
PROGRESS NOTE    [name]  FBP:[id number] DOB: 24-Apr-1946 DOA: 04/06/2022 PCP: Virgie Dad, MD  Outpatient Specialists:     Brief Narrative:  Patient is a 76 year old male with history of advanced dementia with behavioral problems and hypernatremia.  Patient is known to the psychiatric team.  Patient was recently discharged from the hospital.  Patient has been admitted with worsening agitation, dehydration, and severe hypernatremia.  Sodium was 160 presentation.  Sodium level is 158.   Assessment & Plan:   Principal Problem:   Hypernatremia Active Problems:   Alzheimer's dementia with behavioral disturbance (HCC)   Severe hypernatremia: -Volume related (poor p.o. intake). -Continue with IV fluids. -Monitor rate of sodium improvement closely. -Patient is definitely failing to thrive. -We need to consult the palliative care team to discuss goals of care.  Dementia with behavioral problems: -Patient has fairly advanced dementia. -Awaiting psychiatry input. -Treat dehydration/hyponatremia. -Patient is likely at the end stages of dementia. -We will consult palliative care team. -Antipsychotics, antiepileptics and dementia medications.    Dehydration: -Continue to hydrate patient.  Hypernatremia: -Volume relate of improvement.   -Tinea IV fluids.  Hypertensive urgency: -Resolved   DVT prophylaxis: Subcutaneous Lovenox Code Status: No CPR or intubation.  Okay with medication. Family Communication: Wife Disposition Plan: Likely back to the facility   Consultants:  Awaiting psychiatry input.  Procedures:  None  Antimicrobials:  None   Subjective: Patient remains agitated. -Patient has been able to give coherent history.  Objective: Vitals:   04/07/22 0700 04/07/22 0958 04/07/22 1416 04/07/22 1824  BP: (!) 176/150 (!) 128/111 102/60 90/61  Pulse: 73 62 100 83  Resp:  (!) '24 16 18  '$ Temp: (!) 96.7 F (35.9 C) 98 F (36.7 C) (!) 97.4 F (36.3  C) 98.5 F (36.9 C)  TempSrc: Axillary  Oral Oral  SpO2:  (!) 87% (!) 88% (!) 81%  Weight:      Height:        Intake/Output Summary (Last 24 hours) at 04/07/2022 1850 Last data filed at 04/07/2022 1824 Gross per 24 hour  Intake 1778.25 ml  Output 550 ml  Net 1228.25 ml   Filed Weights   04/06/22 1621  Weight: 41.5 kg    Examination:  General exam: Patient remains agitated.  Patient is cachectic. Respiratory system: Clear to auscultation.  Cardiovascular system: S1 & S2 heard Gastrointestinal system: Abdomen is nondistended, soft and nontender.  Central nervous system: Awake but agitated.  Will not comply with full examination  Extremities: No leg edema.  Data Reviewed: I have personally reviewed following labs and imaging studies  CBC: Recent Labs  Lab 04/06/22 1730 04/07/22 0229  WBC 3.0* 3.4*  NEUTROABS 1.5* 2.1  HGB 14.3 13.5  HCT 46.7 41.6  MCV 108.4* 102.2*  PLT 201 824   Basic Metabolic Panel: Recent Labs  Lab 04/06/22 1730 04/07/22 0229 04/07/22 0512 04/07/22 1237  NA 160* 159* 158* 152*  K 4.3 3.8 4.0 4.3  CL >130* 125* 125* 121*  CO2 '28 28 28 26  '$ GLUCOSE 102* 110* 115* 97  BUN 41* 36* 35* 32*  CREATININE 0.88 0.73 0.66 0.64  CALCIUM 9.2 9.1 9.1 8.6*   GFR: Estimated Creatinine Clearance: 48.3 mL/min (by C-G formula based on SCr of 0.64 mg/dL). Liver Function Tests: Recent Labs  Lab 04/06/22 1730  AST 40  ALT 39  ALKPHOS 79  BILITOT 0.9  PROT 6.2*  ALBUMIN 3.4*   No results for input(s): "LIPASE", "AMYLASE" in the  last 168 hours. Recent Labs  Lab 04/06/22 1903  AMMONIA 33   Coagulation Profile: No results for input(s): "INR", "PROTIME" in the last 168 hours. Cardiac Enzymes: Recent Labs  Lab 04/06/22 1730  CKTOTAL 155   BNP (last 3 results) No results for input(s): "PROBNP" in the last 8760 hours. HbA1C: No results for input(s): "HGBA1C" in the last 72 hours. CBG: Recent Labs  Lab 04/07/22 0028 04/07/22 0657  04/07/22 1133 04/07/22 1818  GLUCAP 107* 105* 92 114*   Lipid Profile: No results for input(s): "CHOL", "HDL", "LDLCALC", "TRIG", "CHOLHDL", "LDLDIRECT" in the last 72 hours. Thyroid Function Tests: Recent Labs    04/06/22 1735  TSH 12.651*   Anemia Panel: No results for input(s): "VITAMINB12", "FOLATE", "FERRITIN", "TIBC", "IRON", "RETICCTPCT" in the last 72 hours. Urine analysis:    Component Value Date/Time   COLORURINE AMBER (A) 04/06/2022 2020   APPEARANCEUR CLOUDY (A) 04/06/2022 2020   LABSPEC 1.031 (H) 04/06/2022 2020   LABSPEC 1.020 10/10/2018 1237   PHURINE 5.0 04/06/2022 2020   GLUCOSEU NEGATIVE 04/06/2022 2020   HGBUR NEGATIVE 04/06/2022 2020   BILIRUBINUR NEGATIVE 04/06/2022 2020   BILIRUBINUR negative 10/10/2018 1237   BILIRUBINUR n 07/25/2012 1359   KETONESUR 5 (A) 04/06/2022 2020   PROTEINUR 30 (A) 04/06/2022 2020   UROBILINOGEN negative 07/25/2012 1359   UROBILINOGEN 0.2 01/30/2007 0955   NITRITE NEGATIVE 04/06/2022 2020   LEUKOCYTESUR MODERATE (A) 04/06/2022 2020   Sepsis Labs: '@LABRCNTIP'$ (procalcitonin:4,lacticidven:4)  )No results found for this or any previous visit (from the past 240 hour(s)).       Radiology Studies: CT Head Wo Contrast  Result Date: 04/06/2022 CLINICAL DATA:  Mental status change. EXAM: CT HEAD WITHOUT CONTRAST TECHNIQUE: Contiguous axial images were obtained from the base of the skull through the vertex without intravenous contrast. RADIATION DOSE REDUCTION: This exam was performed according to the departmental dose-optimization program which includes automated exposure control, adjustment of the mA and/or kV according to patient size and/or use of iterative reconstruction technique. COMPARISON:  CT head 11/17/2021 FINDINGS: Brain: Generalized atrophy. 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 CSF collection lateral to the left cerebellum unchanged with mass-effect on the cerebellum.  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 This does not appear to be causing obstructive hydrocephalus. Negative for acute infarct, hemorrhage, mass Vascular: Negative for hyperdense vessel Skull: Negative Sinuses/Orbits: Paranasal sinuses clear.  Negative orbit Other: None IMPRESSION: Progressive enlargement of the ventricles since the CT of 11/17/2021 suggesting hydrocephalus. 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 Generalized atrophy.  No acute infarct or hemorrhage. Electronically Signed   By: Franchot Gallo M.D.   On: 04/06/2022 18:15   DG Chest Portable 1 View  Result Date: 04/06/2022 CLINICAL DATA:  Weakness. EXAM: PORTABLE CHEST 1 VIEW COMPARISON:  October 10, 2018. FINDINGS: The heart size and mediastinal contours are within normal limits. Both lungs are clear. The visualized skeletal structures are unremarkable. IMPRESSION: No active disease. Electronically Signed   By: Marijo Conception M.D.   On: 04/06/2022 18:15        Scheduled Meds:  brexpiprazole  3 mg Oral Daily   donepezil  10 mg Oral QHS   enoxaparin (LOVENOX) injection  30 mg Subcutaneous Q24H   [START ON 04/13/2022] levothyroxine  37.5 mcg Intravenous Daily   memantine  10 mg Oral BID   mirtazapine  15 mg Oral QHS   OLANZapine  2.5 mg Oral QHS   Continuous Infusions:  dextrose 100 mL/hr  at 04/07/22 0720   valproate sodium 500 mg (04/07/22 0944)     LOS: 1 day    Time spent: 55 minutes    Dana Allan, MD  [facility] Pager #: [phone] 7PM-7AM contact night coverage as above

## 2022-04-08 ENCOUNTER — Other Ambulatory Visit: Payer: Self-pay

## 2022-04-08 DIAGNOSIS — G309 Alzheimer's disease, unspecified: Secondary | ICD-10-CM | POA: Diagnosis not present

## 2022-04-08 DIAGNOSIS — F02818 Dementia in other diseases classified elsewhere, unspecified severity, with other behavioral disturbance: Secondary | ICD-10-CM | POA: Diagnosis not present

## 2022-04-08 DIAGNOSIS — F309 Manic episode, unspecified: Secondary | ICD-10-CM

## 2022-04-08 DIAGNOSIS — F29 Unspecified psychosis not due to a substance or known physiological condition: Secondary | ICD-10-CM

## 2022-04-08 DIAGNOSIS — E870 Hyperosmolality and hypernatremia: Secondary | ICD-10-CM | POA: Diagnosis not present

## 2022-04-08 LAB — BASIC METABOLIC PANEL
Anion gap: 3 — ABNORMAL LOW (ref 5–15)
Anion gap: 6 (ref 5–15)
BUN: 30 mg/dL — ABNORMAL HIGH (ref 8–23)
BUN: 30 mg/dL — ABNORMAL HIGH (ref 8–23)
CO2: 25 mmol/L (ref 22–32)
CO2: 26 mmol/L (ref 22–32)
Calcium: 8.5 mg/dL — ABNORMAL LOW (ref 8.9–10.3)
Calcium: 8.8 mg/dL — ABNORMAL LOW (ref 8.9–10.3)
Chloride: 119 mmol/L — ABNORMAL HIGH (ref 98–111)
Chloride: 122 mmol/L — ABNORMAL HIGH (ref 98–111)
Creatinine, Ser: 0.63 mg/dL (ref 0.61–1.24)
Creatinine, Ser: 0.93 mg/dL (ref 0.61–1.24)
GFR, Estimated: >60 mL/min (ref >60)
GFR, Estimated: >60 mL/min (ref >60)
Glucose, Bld: 123 mg/dL — ABNORMAL HIGH (ref 70–99)
Glucose, Bld: 94 mg/dL (ref 70–99)
Potassium: 3.5 mmol/L (ref 3.5–5.1)
Potassium: 3.7 mmol/L (ref 3.5–5.1)
Sodium: 150 mmol/L — ABNORMAL HIGH (ref 135–145)
Sodium: 151 mmol/L — ABNORMAL HIGH (ref 135–145)

## 2022-04-08 LAB — URINE CULTURE: Culture: >=100000 — AB

## 2022-04-08 LAB — IRON AND TIBC
Iron: 14 ug/dL — ABNORMAL LOW (ref 45–182)
Saturation Ratios: 10 % — ABNORMAL LOW (ref 17.9–39.5)
TIBC: 136 ug/dL — ABNORMAL LOW (ref 250–450)
UIBC: 122 ug/dL

## 2022-04-08 LAB — FERRITIN: Ferritin: 636 ng/mL — ABNORMAL HIGH (ref 24–336)

## 2022-04-08 LAB — GLUCOSE, CAPILLARY
Glucose-Capillary: 107 mg/dL — ABNORMAL HIGH (ref 70–99)
Glucose-Capillary: 119 mg/dL — ABNORMAL HIGH (ref 70–99)
Glucose-Capillary: 127 mg/dL — ABNORMAL HIGH (ref 70–99)
Glucose-Capillary: 127 mg/dL — ABNORMAL HIGH (ref 70–99)
Glucose-Capillary: 82 mg/dL (ref 70–99)

## 2022-04-08 LAB — VITAMIN B12: Vitamin B-12: 1039 pg/mL — ABNORMAL HIGH (ref 180–914)

## 2022-04-08 LAB — RETICULOCYTES
Immature Retic Fract: 10.1 % (ref 2.3–15.9)
RBC.: 3.7 MIL/uL — ABNORMAL LOW (ref 4.22–5.81)
Retic Count, Absolute: 48.8 10*3/uL (ref 19.0–186.0)
Retic Ct Pct: 1.3 % (ref 0.4–3.1)

## 2022-04-08 LAB — FOLATE: Folate: 3.1 ng/mL — ABNORMAL LOW (ref >5.9)

## 2022-04-08 LAB — CK: Total CK: 305 U/L (ref 49–397)

## 2022-04-08 LAB — T4, FREE: Free T4: 0.46 ng/dL — ABNORMAL LOW (ref 0.61–1.12)

## 2022-04-08 MED ORDER — THIAMINE HCL 100 MG/ML IJ SOLN: 100.0000 mg | Freq: Every day | INTRAMUSCULAR | Status: DC

## 2022-04-08 MED ORDER — OLANZAPINE 2.5 MG PO TABS
2.5000 mg | ORAL_TABLET | Freq: Every day | ORAL | Status: DC
  Administered 2022-04-08 – 2022-04-13 (×6): 2.5 mg via ORAL
  Filled 2022-04-08 (×6): qty 1

## 2022-04-08 MED ORDER — OLANZAPINE 5 MG PO TBDP
2.5000 mg | ORAL_TABLET | Freq: Three times a day (TID) | ORAL | Status: DC | PRN
  Administered 2022-04-13: 2.5 mg via ORAL
  Filled 2022-04-08 (×2): qty 0.5

## 2022-04-08 MED ORDER — OLANZAPINE 5 MG PO TABS: 5.0000 mg | ORAL_TABLET | Freq: Every day | ORAL | Status: DC

## 2022-04-08 MED ORDER — OLANZAPINE 10 MG IM SOLR
2.5000 mg | INTRAMUSCULAR | Status: AC
  Administered 2022-04-08: 2.5 mg via INTRAMUSCULAR
  Filled 2022-04-08: qty 10

## 2022-04-08 MED ORDER — LORAZEPAM 2 MG/ML IJ SOLN
1.0000 mg | Freq: Once | INTRAMUSCULAR | Status: DC
Start: 2022-04-08 — End: 2022-04-08

## 2022-04-08 MED ORDER — FOLIC ACID 5 MG/ML IJ SOLN
1.0000 mg | Freq: Every day | INTRAMUSCULAR | Status: DC
Start: 2022-04-08 — End: 2022-04-08

## 2022-04-08 MED ORDER — FOLIC ACID 5 MG/ML IJ SOLN
1.0000 mg | Freq: Every day | INTRAMUSCULAR | Status: DC
  Administered 2022-04-08 – 2022-04-12 (×4): 1 mg via INTRAVENOUS
  Filled 2022-04-08 (×6): qty 0.2

## 2022-04-08 MED ORDER — THIAMINE HCL 100 MG/ML IJ SOLN
250.0000 mg | Freq: Every day | INTRAVENOUS | Status: DC
  Administered 2022-04-10 – 2022-04-12 (×3): 250 mg via INTRAVENOUS
  Filled 2022-04-08 (×4): qty 2.5

## 2022-04-08 MED ORDER — HALOPERIDOL LACTATE 5 MG/ML IJ SOLN: 2.0000 mg | Freq: Four times a day (QID) | INTRAMUSCULAR | Status: DC | PRN

## 2022-04-08 MED ORDER — THIAMINE HCL 100 MG/ML IJ SOLN
500.0000 mg | Freq: Three times a day (TID) | INTRAVENOUS | Status: AC
  Administered 2022-04-08 – 2022-04-10 (×6): 500 mg via INTRAVENOUS
  Filled 2022-04-08 (×6): qty 5

## 2022-04-08 MED ORDER — HALOPERIDOL LACTATE 5 MG/ML IJ SOLN
1.0000 mg | Freq: Once | INTRAMUSCULAR | Status: AC
  Administered 2022-04-08: 1 mg via INTRAVENOUS
  Filled 2022-04-08: qty 1

## 2022-04-08 MED ORDER — FERROUS GLUCONATE 324 (38 FE) MG PO TABS
324.0000 mg | ORAL_TABLET | Freq: Every day | ORAL | Status: DC
  Administered 2022-04-09 – 2022-04-12 (×4): 324 mg via ORAL
  Filled 2022-04-08 (×4): qty 1

## 2022-04-08 MED ORDER — DEXTROSE 5 % IV SOLN
INTRAVENOUS | Status: DC
Start: 2022-04-08 — End: 2022-04-09

## 2022-04-08 MED ORDER — OLANZAPINE 10 MG IM SOLR
2.5000 mg | Freq: Three times a day (TID) | INTRAMUSCULAR | Status: DC | PRN
Start: 2022-04-08 — End: 2022-04-14

## 2022-04-08 NOTE — Consult Note (Addendum)
Neurology Consultation  Reason for Consult: NPH and dystonia  Referring Physician: Dr. Renne Crigler  CC: agitation   History is obtained from:medical record   HPI: [name] is a 76 y.o. male with past medical history of alzheimer disease, HTN, prostate cancer , sleep apnea, HLD, GERD who presented to the hospital on 8/31 for increasing agitation and poor oral intake. Patient is from a SNF.   Wife is at the bedside. She states he has had a progressive decline since March of this past year. In March he started to become more confused, started to hallucinate and started to lose interest in things and began to have a shuffling gait. They recently moved to [place] at the end of June. At this time he started to have frequent falls, worsening hallucinations and started to have decreased appetite. [facility] was placed in skilled nursing in August. Since January of 2023 he lost 55 pounds. He continues to be agitated. CT head revealed enlarging ventricles  He just received Zyprexa IM prior to my examination     ROS:  Unable to obtain due to altered mental status.   Past Medical History:  Diagnosis Date   Allergy    Alzheimer disease ([place]) 06/2018   diagnosed with early stage alzheimers   Arthritis    osteoarthritis   Cancer ([place])    testicular, age 40, orchiectomy 1992   Cataract    B/L CATARACTS NO SURGERY   Dementia ([place])    Depression    Dyslipidemia    Heartburn    TAKES ZANTAC   History of radiation therapy 12/02/18- 01/14/19   Head and neck/ base of tongue. 60 Gy over 30 fractions.    Hypertension    Hypogonadism male    Prostate cancer ([place]) 06/07/11   gleason 3+3=6, vol 59.5 cc   Sleep apnea    Status post chemotherapy    testicular cancer 1992, Dr Beryle Beams     Family History  Problem Relation Age of Onset   Lung cancer Mother    Hypertension Mother    Lung cancer Father    Heart failure Father    Hypertension Father      Social History:   reports that he quit  smoking about 22 years ago. His smoking use included cigarettes. He has a 20.00 pack-year smoking history. He has never used smokeless tobacco. He reports that he does not currently use alcohol. He reports that he does not use drugs.  Medications  Current Facility-Administered Medications:    acetaminophen (TYLENOL) tablet 650 mg, 650 mg, Oral, Q6H PRN, 650 mg at 04/07/22 1942 **OR** acetaminophen (TYLENOL) suppository 650 mg, 650 mg, Rectal, Q6H PRN, Rise Patience, MD   brexpiprazole (REXULTI) tablet 3 mg, 3 mg, Oral, Daily, Rise Patience, MD, 3 mg at 04/08/22 0914   dextrose 5 % solution, , Intravenous, Continuous, Gherghe, Vella Redhead, MD, Last Rate: 100 mL/hr at 04/08/22 0750, New Bag at 04/08/22 0750   donepezil (ARICEPT) tablet 10 mg, 10 mg, Oral, QHS, Rise Patience, MD, 10 mg at 04/07/22 2029   enoxaparin (LOVENOX) injection 30 mg, 30 mg, Subcutaneous, Q24H, Rise Patience, MD, 30 mg at 04/07/22 2028   hydrALAZINE (APRESOLINE) injection 5 mg, 5 mg, Intravenous, Q4H PRN, Rise Patience, MD, 5 mg at 04/06/22 2251   [START ON 04/13/2022] levothyroxine (SYNTHROID, LEVOTHROID) injection 37.5 mcg, 37.5 mcg, Intravenous, Daily, Legge, Justin M, RPH   LORazepam (ATIVAN) injection 1 mg, 1 mg, Intravenous, Once, Cinderella, Joycelyn Schmid  A   LORazepam (ATIVAN) tablet 0.5 mg, 0.5 mg, Oral, BID PRN, Rise Patience, MD, 0.5 mg at 04/07/22 1942   memantine (NAMENDA) tablet 10 mg, 10 mg, Oral, BID, Rise Patience, MD, 10 mg at 04/08/22 0914   mirtazapine (REMERON SOL-TAB) disintegrating tablet 15 mg, 15 mg, Oral, QHS, Rise Patience, MD, 15 mg at 04/07/22 2029   OLANZapine (ZYPREXA) tablet 5 mg, 5 mg, Oral, QHS, Gherghe, Vella Redhead, MD   valproate (DEPACON) 500 mg in dextrose 5 % 50 mL IVPB, 500 mg, Intravenous, Q12H, Rise Patience, MD, Last Rate: 55 mL/hr at 04/08/22 0924, 500 mg at 04/08/22 0924   Exam: Current vital signs: BP (!) 153/74 (BP Location:  Left Arm)   Pulse (!) 56   Temp (!) 97.5 F (36.4 C) (Oral)   Resp 18   Ht '5\' 6"'$  (1.676 m)   Wt 41.5 kg   SpO2 97%   BMI 14.77 kg/m  Vital signs in last 24 hours: Temp:  [97.4 F (36.3 C)-98.5 F (36.9 C)] 97.5 F (36.4 C) (09/02 0411) Pulse Rate:  [56-100] 56 (09/02 0411) Resp:  [16-18] 18 (09/02 0411) BP: (90-158)/(57-94) 153/74 (09/02 0411) SpO2:  [81 %-100 %] 97 % (09/02 0411)  GENERAL: critically ill frail elderly male lethargic in NAD HEENT: - Normocephalic and atraumatic, dry mm LUNGS - Clear to auscultation bilaterally with no wheezes CV - S1S2 RRR, no m/r/g, equal pulses bilaterally. ABDOMEN - Soft, nontender, nondistended with normoactive BS Ext: warm, well perfused, intact peripheral pulses, no edema  NEURO:  Mental Status: Exam is limited to recent administration of Zyprexa. AA&Ox self. He follows simple commands, such as sticking out his tongue and wiggling his toes. He does not repeat phrases, limited output. Did clearly tell me to "No Stop" Language: speech is garbled at times.  Cranial Nerves: PERRL, EOMI, visual fields full to threat, facial asymmetry, facial sensation intact, hearing intact, tongue/uvula/soft palate midline, normal sternocleidomastoid and trapezius muscle strength. No evidence of tongue atrophy or fibrillations Motor: very rigid extremities x4. His bilateral legs are bent up and to the left, screams when attempting to move his legs. After Zyprexa all extremities are less rigid, still unable to extend extremities Tone: is increased and bulk is decreased Sensation- Intact to light touch bilaterally Coordination: unable to assess Gait- deferred   Labs I have reviewed labs in epic and the results pertinent to this consultation are:  CBC    Component Value Date/Time   WBC 3.4 (L) 04/07/2022 0229   RBC 3.70 (L) 04/08/2022 1205   RBC 4.07 (L) 04/07/2022 0229   HGB 13.5 04/07/2022 0229   HGB 15.1 01/19/2022 0734   HCT 41.6 04/07/2022 0229    HCT 42.1 01/19/2022 0734   PLT 208 04/07/2022 0229   PLT 169 01/19/2022 0734   MCV 102.2 (H) 04/07/2022 0229   MCV 91 01/19/2022 0734   MCH 33.2 04/07/2022 0229   MCHC 32.5 04/07/2022 0229   RDW 15.1 04/07/2022 0229   RDW 13.0 01/19/2022 0734   LYMPHSABS 0.8 04/07/2022 0229   LYMPHSABS 0.6 (L) 01/19/2022 0734   MONOABS 0.4 04/07/2022 0229   EOSABS 0.1 04/07/2022 0229   EOSABS 0.1 01/19/2022 0734   BASOSABS 0.0 04/07/2022 0229   BASOSABS 0.0 01/19/2022 0734    CMP     Component Value Date/Time   NA 151 (H) 04/08/2022 0023   NA 143 03/09/2022 0000   K 3.5 04/08/2022 0023   CL 122 (H) 04/08/2022  0023   CO2 26 04/08/2022 0023   GLUCOSE 94 04/08/2022 0023   BUN 30 (H) 04/08/2022 0023   BUN 37 (A) 03/09/2022 0000   CREATININE 0.63 04/08/2022 0023   CREATININE 0.81 04/10/2019 1442   CREATININE 0.84 06/08/2016 1425   CALCIUM 8.5 (L) 04/08/2022 0023   PROT 6.2 (L) 04/06/2022 1730   PROT 6.3 01/19/2022 0734   ALBUMIN 3.4 (L) 04/06/2022 1730   ALBUMIN 4.4 01/19/2022 0734   AST 40 04/06/2022 1730   AST 19 09/19/2018 0757   ALT 39 04/06/2022 1730   ALT 18 09/19/2018 0757   ALKPHOS 79 04/06/2022 1730   BILITOT 0.9 04/06/2022 1730   BILITOT 0.4 01/19/2022 0734   BILITOT 0.5 09/19/2018 0757   GFRNONAA >60 04/08/2022 0023   GFRNONAA >60 04/10/2019 1442   GFRAA 106 08/21/2019 1450   GFRAA >60 04/10/2019 1442    Lipid Panel     Component Value Date/Time   CHOL 110 11/23/2021 1526   TRIG 93 11/23/2021 1526   HDL 49 11/23/2021 1526   CHOLHDL 2.2 11/23/2021 1526   CHOLHDL 2.5 09/11/2016 1111   VLDL 18 09/11/2016 1111   LDLCALC 43 11/23/2021 1526     Imaging I have reviewed the images obtained:  CT-head Progressive enlargement of the ventricles since the CT of 11/17/2021 suggesting hydrocephalus Generalized atrophy.  No acute infarct or hemorrhage.  LABS: Folate 3.1 Vitamin B 12 1039 TSH 12.651 UA positive, Cx in progress   Beulah Gandy DNP, ACNPC-AG   I have  seen the patient and reviewed the above note. He is significantly encephalopathic, he does not follow commands  Assessment:   PIETER FOOKS is a 76 y.o. male with past medical history of alzheimer disease, HTN, prostate cancer , sleep apnea, HLD, GERD who presented to the hospital on 8/31 for increasing agitation and poor oral intake.  He has had a steady decline over the past few years.  His abrupt worsening corresponds to environmental changes with moved from a townhome to independent living.  He has subsequently gotten significantly worse since being moved into a skilled facility.  He has not been eating for quite some time, with significant weight loss down to 41.5 kg at this time with a BMI of 14.77  Overall the history of a multiyear decline in mentation coupled with the fact that he was already doing very poorly in April without enlarged ventricles makes me think that normal pressure hydrocephalus is very unlikely to be the main driver.  His ventricles have enlarged some since April, but he has significant atrophy.  At this point, even if NPH was superimposed upon his underlying neurodegenerative disorder, I do not think he would be a candidate for any type of surgical intervention.  I do not think an LP would be helpful in determining that candidacy.  One, it would be exceedingly difficult to judge response given the severity of his cognitive deficits and fact that he is nonambulatory.  Two, even if he did have some benefit from the procedure, it would be expected to improve him only to the point of still having a progressive neurodegenerative condition like Alzheimer's.  Three, given the severity of his symptoms, even if this was all due to NPH I think his prognosis would be poor.  I suspect the increasing rapidity of his decline to be multifactorial, driven by poor p.o. intake, likely some component of depression, deconditioning, and environmental changes.  Especially with his poor p.o. intake,  I would  not expect him to improve.   Recommendations: - Check thiamine level, Vitamin B6, T3 and T4 - With poor nutritional intake would start high dose thiamine - evaluate for infections such as urinary tract infection - Delirium precautions - Palliative care consultation - At this time I do not think that he is at all likely to benefit from any type of surgical intervention and therefore would not pursue LP given that it is unlikely to change management. - Can treat underlying metabolic stressors such as urinary tract infection, thiamine repletion (though deficiency has not been established), hypothyroidism but I suspect that without some type of permanent feeding tube he is likely to continue declining rapidly.  I discussed this with his wife who indicated that she did not think he would tolerate having one placed. -If he improves significantly to the point that he is ambulatory with the above treatments, then could consider further evaluation for shunt as outpatient, though again he was already symptomatic prior to developing the signs of hydrocephalus on CT strongly suggesting a different underlying neurodegenerative disease - Neurology will continue to be available on an as-needed basis.   Roland Rack, MD [facility] [phone]  If 7pm- 7am, please page neurology on call as listed in [place].

## 2022-04-08 NOTE — Progress Notes (Addendum)
PROGRESS NOTE  [name]:[id number] DOB: [birth year] DOA: 04/06/2022 PCP: Virgie Dad, MD   LOS: 2 days   Brief Narrative / Interim history: 76 y.o. male with known history of Alzheimer's dementia was brought to the ER after patient became increasingly agitated with poor oral intake.  Patient was brought to the ER about 2 weeks ago at [facility] at that time patient's medications were adjusted and patient was discharged back to skilled nursing facility.  Patient has been living at the skilled nursing facility for over a month now.  Patient has been progressively getting more agitated.  Over the last few days has not eaten well.  He was found to have hypernatremia and admitted to the hospital.  Hospital course complicated by persistent agitation  Subjective / 24h Interval events: Confused this morning, mittens on, mumbling unintelligibly.  Assesement and Plan: Principal problem Hypernatremia-due to poor p.o. 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  Sodium better this morning, 151, continue D5W and repeat sodium tomorrow morning.  Active problems Advanced dementia, with behavioral problems-patient has been declining more so over the last 6 months, and when he and her wife moved from their condo to independent living he had a more rapid decline.  At 1 point, about a month ago, he was so weak that he had moved to the skilled nursing portion of their facility where he declined even faster.  He has been having increased hallucinations, very poor p.o. intake.  Currently he is on brexpiprazole as well as Zyprexa in the evening.  Increase Zyprexa dose tonight.  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  She is hopeful for some recovery from this current hospitalization to the point that his hallucinations would be a little bit better and then he will have some p.o. intake, but understands that if he is not responding to treatment and there is really no improvement, hospice may be the next appropriate step.  Hypertensive urgency-blood pressure better  Hypothyroidism-continue Synthroid, TSH was checked on 8/31 and he was found to be elevated 12.6.  This is likely due to either poor p.o. absorption or simply that he is not taking his meds consistently.  Currently has been placed on IV Synthroid with hopes that it would reverse some of his worsening mentation  Hypokalemia-potassium stable today at 3.5  Macrocytosis-check anemia panel, B12 has not been checked (at least in our system) since 2017  Severe protein calorie malnutrition-BMI 14.7, in the setting of advanced dementia  Scheduled Meds:  brexpiprazole  3 mg Oral Daily   donepezil  10 mg Oral QHS   enoxaparin (LOVENOX) injection  30 mg Subcutaneous Q24H   [START ON 04/13/2022] levothyroxine  37.5 mcg Intravenous Daily   memantine  10 mg Oral BID   mirtazapine  15 mg Oral QHS   OLANZapine  5 mg Oral QHS   Continuous Infusions:  dextrose 100 mL/hr at 04/08/22 0750   valproate sodium 500 mg (04/08/22 0924)   PRN Meds:.acetaminophen **OR** acetaminophen, haloperidol lactate, hydrALAZINE, LORazepam  Diet Orders (From admission, onward)     Start     Ordered   04/06/22 2036  Diet Heart Room service appropriate? Yes; Fluid consistency: Thin  Diet effective now  Comments: When patient is alert and awake.  Question Answer Comment  Room service appropriate? Yes   Fluid consistency: Thin      04/06/22 2037            DVT prophylaxis: enoxaparin (LOVENOX) injection 30 mg Start: 04/06/22 2200   Lab Results  Component Value Date   PLT 208 04/07/2022      Code Status: Partial Code  Family Communication: Wife  present at bedside  Status is: Inpatient Remains inpatient appropriate because: Agitation, hypernatremia   Level of care: Telemetry  Consultants:  Palliative care  Objective: Vitals:   04/07/22 2139 04/07/22 2210 04/08/22 0033 04/08/22 0411  BP: (!) 158/94  (!) 112/57 (!) 153/74  Pulse:   67 (!) 56  Resp:   16 18  Temp:   97.9 F (36.6 C) (!) 97.5 F (36.4 C)  TempSrc:   Oral Oral  SpO2:  100% 94% 97%  Weight:      Height:        Intake/Output Summary (Last 24 hours) at 04/08/2022 1036 Last data filed at 04/08/2022 0931 Gross per 24 hour  Intake 1200 ml  Output 370 ml  Net 830 ml   Wt Readings from Last 3 Encounters:  04/06/22 41.5 kg  04/06/22 41.5 kg  04/05/22 41.5 kg    Examination:  Constitutional: Cachectic appearing Eyes: no scleral icterus ENMT: Mucous membranes are moist.  Neck: normal, supple Respiratory: clear to auscultation bilaterally, no wheezing, no crackles.  Cardiovascular: Regular rate and rhythm, no murmurs / rubs / gallops. No LE edema.  Abdomen: non distended, no tenderness. Bowel sounds positive.  Musculoskeletal: no clubbing / cyanosis.  Skin: no rashes Neurologic: Does not follow commands but appears to move all 4 extremities independently   Data Reviewed: I have independently reviewed following labs and imaging studies   CBC Recent Labs  Lab 04/06/22 1730 04/07/22 0229  WBC 3.0* 3.4*  HGB 14.3 13.5  HCT 46.7 41.6  PLT 201 208  MCV 108.4* 102.2*  MCH 33.2 33.2  MCHC 30.6 32.5  RDW 15.1 15.1  LYMPHSABS 1.0 0.8  MONOABS 0.3 0.4  EOSABS 0.1 0.1  BASOSABS 0.1 0.0    Recent Labs  Lab 04/06/22 1730 04/06/22 1735 04/06/22 1903 04/07/22 0229 04/07/22 0512 04/07/22 1237 04/07/22 2107 04/08/22 0023  NA 160*  --   --  159* 158* 152* 151* 151*  K 4.3  --   --  3.8 4.0 4.3 3.4* 3.5  CL >130*  --   --  125* 125* 121* 121* 122*  CO2 28  --   --  '28 28 26 24 26  '$ GLUCOSE 102*  --   --  110* 115* 97 98 94  BUN 41*  --   --   36* 35* 32* 29* 30*  CREATININE 0.88  --   --  0.73 0.66 0.64 0.56* 0.63  CALCIUM 9.2  --   --  9.1 9.1 8.6* 8.6* 8.5*  AST 40  --   --   --   --   --   --   --   ALT 39  --   --   --   --   --   --   --   ALKPHOS 79  --   --   --   --   --   --   --   BILITOT 0.9  --   --   --   --   --   --   --  ALBUMIN 3.4*  --   --   --   --   --   --   --   TSH  --  12.651*  --   --   --   --   --   --   AMMONIA  --   --  33  --   --   --   --   --     ------------------------------------------------------------------------------------------------------------------ No results for input(s): "CHOL", "HDL", "LDLCALC", "TRIG", "CHOLHDL", "LDLDIRECT" in the last 72 hours.  No results found for: "HGBA1C" ------------------------------------------------------------------------------------------------------------------ Recent Labs    04/06/22 1735  TSH 12.651*    Cardiac Enzymes No results for input(s): "CKMB", "TROPONINI", "MYOGLOBIN" in the last 168 hours.  Invalid input(s): "CK" ------------------------------------------------------------------------------------------------------------------ No results found for: "BNP"  CBG: Recent Labs  Lab 04/07/22 0657 04/07/22 1133 04/07/22 1818 04/08/22 0028 04/08/22 0455  GLUCAP 105* 92 114* 82 107*    No results found for this or any previous visit (from the past 240 hour(s)).   Radiology Studies: No results found.   Marzetta Board, MD, PhD [facility]  Between 7 am - 7 pm I am available, please contact me via Amion (for emergencies) or Securechat (non urgent messages)  Between 7 pm - 7 am I am not available, please contact night coverage MD/APP via Amion

## 2022-04-08 NOTE — Consult Note (Signed)
[name] New Face-to-Face Psychiatric Evaluation   Service Date: April 08, 2022 LOS:  LOS: 2 days    Assessment  [name] is a 76 y.o. male admitted medically for 04/06/2022  3:59 PM for hypernatremia. He carries the psychiatric diagnoses of depression and has a past medical history mostly significant for dementia - also dx with remote testicular cancer, cataract, dyslipidemia, heartburn, hx radiation therapy, hypertension, prostate cancer, sleep apnea.Mathis was consulted for "dementia with behavioral problem" by Dr. Marthenia Rolling.     His current presentation of rapidly deteriorating mental status in the setting of hypernatremia is most consistent with acute encephalopathy in the background of dementia. He did have fairly impressive rigidity in all 4 extremities at time of initial evaluation; had intended to give pt IV lorazepam but he instead got IM olanzapine (had meant to order this PRN) with actually fairly good response in terms of rigidity perhaps due to minimal blockade at D2 receptor (more associates/dissociates rapidly) compared to anticholinergic profile. There is also a chance that some of his rapid deterioration is due to normal pressure hydrocephalus; have asked neurology to evaluate. He also has a UTI which is likely worsening his overall mental status.  He does have a history of depression predating dementia diagnosis, however will move towards reducing medication regimen over next several days in setting of hypernatremia and encephalopathy.  Current outpatient psychotropic medications include brexipiprazole 2 mg, depakote 500 mg BID, aricept 10 mg QHS, memantine 10 mg BID, mirtazapine 15 mg QHS< olanzapine 2.5 mg QHS, trintillex '20mg'$  qD and historically he has had a poor response to these medications. He was likely compliant with medications prior to admission as evidenced by resident in nursing home. I do think we can get away with less antipsychotics in the  hospital if he is reliably getting Depakote for agitation especially with how rigid he was. I also ordered an EKG, as he had gotten a variety of antipsychotics and last on file is in 2020. On initial examination, patient lay in bed, did not follow commands, and did not respond to examiner except to emit a high pitched whine when I attempted to reposition his legs. Catatonia was considered but felt to be less likely - has intermittently been getting BZD with no real improvement.  Please see plan below for detailed recommendations.   Diagnoses:  Active Hospital problems: Principal Problem:   Hypernatremia Active Problems:   Alzheimer's dementia with behavioral disturbance ([name])     Plan  ## Safety and Observation Level:  - Based on my clinical evaluation, I estimate the patient to be at moderate risk of self harm in the current setting largely due to encephalopathy - Did not require a sitter at time of my exam.    ## Medications:  -- STOP brexipiprazole 3 mg qAM (half life 90 hours, no need to taper) -- STOP PRN haldol -- REDUCE olanzapine from 5 mg QHS to 2.5 mg QHS -- CONTINUE depakote 500 mg IV BID -- CONTINUE mirtazapine 15 mg  -- otherwise: continue memantine, aricept,   -- trintillex not ordered; OK with him not getting it  ## Medical Decision Making Capacity:  Functionally lacks at this time.   ## Further Work-up:  -- none currently, agree with w/u by primary team and neurology -- ?likely increase thyroxine - defer to primary   -- most recent EKG on 9/2 had QtC of 422 -- Pertinent labwork reviewed earlier this admission includes: B12 elevated, folate wnl, slightly anemic,  iron low, VPA level 49, ammonia wnl, u/a with evidence of infection, THS 12 (t3/t4 ordered but not resulted) - CT head with atrophy, hydrocephalus -- personally ordered CK; was 305 (elevated from 2 days ago which is bizarre in setting of getting IV fluids, but not overtly elevated) - other labs have not  resulted  - BMI 14  ## Disposition:  -- Cannot determine at this time. Needs medical issues addressed before final psychiatric dispo can be made  ## Behavioral / Environmental:  - Delirium precautions (ordered)  ##Legal Status - voluntary; clearly lacks capacity to leave AMA  Thank you for this consult request. Recommendations have been communicated to the primary team.  We will continue to follow at this time.   [place] A Ericka Marcellus   New history  Relevant Aspects of Hospital Course:  Admitted on 04/06/2022 for altered mental status - found to have hypernatremia, worsening hydrocephalus, and UTI.  Patient Report:  Patient seen in bed. He is largely unresponsive to verbal and tactile stimuli. He allows manipulation of his upper extremities which are quite rigid L>R. When I tried to move his lower extremities (curled up in bizarre position on his side) he let out a high pitched whine.   I went back an hour or so later - had intended for him to get lorazepam but instead got olanzapine. Regardless, rigidity much improved. His mentation appeared slightly better - tracking his wife better, said some single word phrases, etc. Did not attempt to move his legs again (had just been attempted by neuro NP).   ROS:  Level 5 caveat AMS  Collateral information:  Spoke extensively to wife at bedside. She endorses a very rapid decline over about the past month - apparently a month ago he was talking in full sentences (not always on topic), walking with a walker, and continent of urine. She is hopeful that he will improve. I discussed the medication error above (including that he received a low dose of a previously tolerated medication); she expressed understanding and thanks that it was addressed immediately.   Psychiatric History:  Information collected from wife Mostly significant for depression. No schizophrenia, bipolar, OCD, etc that she can recall. No hx SI, SA, or prior psych  hospitalizaitons  Family psych history: unknown   Social History:  Has been married to wife [name] for 21 years.    Family History:  The patient's family history includes Heart failure in his father; Hypertension in his father and mother; Lung cancer in his father and mother.  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 60 Gy over 30 fractions.    Hypertension    Hypogonadism male    Prostate cancer ([place]) 06/07/11   gleason 3+3=6, vol 59.5 cc   Sleep apnea    Status post chemotherapy    testicular cancer 1992, Dr Beryle Beams    Surgical History: Past Surgical History:  Procedure Laterality Date   APPENDECTOMY     COLONOSCOPY  08/07/2005   gessner   DIRECT LARYNGOSCOPY  N/A 08/19/2018   Procedure: DIRECT LARYNGOSCOPY;  Surgeon: Leta Baptist, MD;  Location: [place];  Service: ENT;  Laterality: N/A;   INGUINAL HERNIA REPAIR     w/orchiectomy 1992, retroperitoneal lymph node dissection   IR GASTROSTOMY TUBE REMOVAL  02/28/2019   MENISECTOMY     R&L knee surgeries   PROSTATE SURGERY     biopsy x 2   [facility]  06/13/2021   right inferior mid medial external ear antehelix shave   shx1 Left 11/21/2021   epidermoid cyst inflamed and disrupted   SKIN BIOPSY Right 01/09/2022   chondrodermatitis nodularis helicis , 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   TONGUE BIOPSY N/A 08/19/2018   Procedure: BIOPSY OF TONGUE BASE MASS;  Surgeon: Leta Baptist, MD;  Location: [place];  Service: ENT;  Laterality: N/A;    Medications:   Current Facility-Administered Medications:    acetaminophen  (TYLENOL) tablet 650 mg, 650 mg, Oral, Q6H PRN, 650 mg at 04/07/22 1942 **OR** acetaminophen (TYLENOL) suppository 650 mg, 650 mg, Rectal, Q6H PRN, Rise Patience, MD   brexpiprazole (REXULTI) tablet 3 mg, 3 mg, Oral, Daily, Gean Birchwood N, MD, 3 mg at 04/08/22 0914   dextrose 5 % solution, , Intravenous, Continuous, Gherghe, Costin M, MD, Last Rate: 100 mL/hr at 04/08/22 0750, New Bag at 04/08/22 0750   donepezil (ARICEPT) tablet 10 mg, 10 mg, Oral, QHS, Gean Birchwood N, MD, 10 mg at 04/07/22 2029   enoxaparin (LOVENOX) injection 30 mg, 30 mg, Subcutaneous, Q24H, Gean Birchwood N, MD, 30 mg at 04/07/22 2028   hydrALAZINE (APRESOLINE) injection 5 mg, 5 mg, Intravenous, Q4H PRN, Rise Patience, MD, 5 mg at 04/06/22 2251   [START ON 04/13/2022] levothyroxine (SYNTHROID, LEVOTHROID) injection 37.5 mcg, 37.5 mcg, Intravenous, Daily, Legge, Justin M, RPH   LORazepam (ATIVAN) tablet 0.5 mg, 0.5 mg, Oral, BID PRN, Rise Patience, MD, 0.5 mg at 04/07/22 1942   memantine (NAMENDA) tablet 10 mg, 10 mg, Oral, BID, Rise Patience, MD, 10 mg at 04/08/22 0914   mirtazapine (REMERON SOL-TAB) disintegrating tablet 15 mg, 15 mg, Oral, QHS, Rise Patience, MD, 15 mg at 04/07/22 2029   OLANZapine (ZYPREXA) tablet 5 mg, 5 mg, Oral, QHS, Gherghe, Costin M, MD   valproate (DEPACON) 500 mg in dextrose 5 % 50 mL IVPB, 500 mg, Intravenous, Q12H, Rise Patience, MD, Last Rate: 55 mL/hr at 04/08/22 0924, 500 mg at 04/08/22 0924  Allergies: No Known Allergies     Objective  Vital signs:  Temp:  [97.4 F (36.3 C)-98.5 F (36.9 C)] 97.5 F (36.4 C) (09/02 0411) Pulse Rate:  [56-100] 56 (09/02 0411) Resp:  [16-18] 18 (09/02 0411) BP: (90-158)/(57-94) 153/74 (09/02 0411) SpO2:  [81 %-100 %] 97 % (09/02 0411)  Psychiatric Specialty Exam: Profoundly altered. Not oriented. Not following commands. Generally minimally responsive to voice.   Physical Exam: Physical  Exam Constitutional:      Appearance: He is ill-appearing.     Comments: Malnourished   Pulmonary:     Effort: Pulmonary effort is normal.  Musculoskeletal:     Comments: Rigidity of L>R u/e, improved after IM zyprexa. Did not allow manipulation of l/e  Neurological:     Mental Status: He is disoriented.    Review of Systems  Reason unable to perform ROS: AMS.   Blood pressure (!) 153/74, pulse (!) 56, temperature (!) 97.5 F (36.4 C), temperature source Oral, resp. rate 18, height '5\' 6"'$  (1.676 m), weight  41.5 kg, SpO2 97 %. Body mass index is 14.77 kg/m.

## 2022-04-08 NOTE — Progress Notes (Addendum)
Pt very agitated, yelling out. On call Dr Hal Hope made aware and received new order to give Haldol '1mg'$  IV x1.

## 2022-04-09 DIAGNOSIS — Z789 Other specified health status: Secondary | ICD-10-CM | POA: Diagnosis not present

## 2022-04-09 DIAGNOSIS — E870 Hyperosmolality and hypernatremia: Secondary | ICD-10-CM | POA: Diagnosis not present

## 2022-04-09 DIAGNOSIS — R4182 Altered mental status, unspecified: Secondary | ICD-10-CM | POA: Diagnosis not present

## 2022-04-09 DIAGNOSIS — G309 Alzheimer's disease, unspecified: Secondary | ICD-10-CM | POA: Diagnosis not present

## 2022-04-09 LAB — CBC
HCT: 32 % — ABNORMAL LOW (ref 39.0–52.0)
Hemoglobin: 10.5 g/dL — ABNORMAL LOW (ref 13.0–17.0)
MCH: 32.7 pg (ref 26.0–34.0)
MCHC: 32.8 g/dL (ref 30.0–36.0)
MCV: 99.7 fL (ref 80.0–100.0)
Platelets: 130 10*3/uL — ABNORMAL LOW (ref 150–400)
RBC: 3.21 MIL/uL — ABNORMAL LOW (ref 4.22–5.81)
RDW: 14.7 % (ref 11.5–15.5)
WBC: 3.8 10*3/uL — ABNORMAL LOW (ref 4.0–10.5)
nRBC: 0 % (ref 0.0–0.2)

## 2022-04-09 LAB — BASIC METABOLIC PANEL
Anion gap: 6 (ref 5–15)
BUN: 29 mg/dL — ABNORMAL HIGH (ref 8–23)
CO2: 24 mmol/L (ref 22–32)
Calcium: 7.9 mg/dL — ABNORMAL LOW (ref 8.9–10.3)
Chloride: 110 mmol/L (ref 98–111)
Creatinine, Ser: 0.68 mg/dL (ref 0.61–1.24)
GFR, Estimated: >60 mL/min (ref >60)
Glucose, Bld: 135 mg/dL — ABNORMAL HIGH (ref 70–99)
Potassium: 2.9 mmol/L — ABNORMAL LOW (ref 3.5–5.1)
Sodium: 140 mmol/L (ref 135–145)

## 2022-04-09 LAB — T3: T3, Total: 27 ng/dL — ABNORMAL LOW (ref 71–180)

## 2022-04-09 LAB — GLUCOSE, CAPILLARY
Glucose-Capillary: 131 mg/dL — ABNORMAL HIGH (ref 70–99)
Glucose-Capillary: 83 mg/dL (ref 70–99)
Glucose-Capillary: 99 mg/dL (ref 70–99)

## 2022-04-09 LAB — MAGNESIUM: Magnesium: 2.1 mg/dL (ref 1.7–2.4)

## 2022-04-09 MED ORDER — SODIUM CHLORIDE 0.9 % IV BOLUS
500.0000 mL | Freq: Once | INTRAVENOUS | Status: AC
  Administered 2022-04-09: 500 mL via INTRAVENOUS

## 2022-04-09 MED ORDER — POTASSIUM CHLORIDE CRYS ER 20 MEQ PO TBCR
40.0000 meq | EXTENDED_RELEASE_TABLET | Freq: Once | ORAL | Status: AC
Start: 2022-04-09 — End: 2022-04-09
  Administered 2022-04-09: 40 meq via ORAL
  Filled 2022-04-09: qty 2

## 2022-04-09 MED ORDER — LEVOTHYROXINE SODIUM 100 MCG/5ML IV SOLN
50.0000 ug | Freq: Every day | INTRAVENOUS | Status: DC
  Administered 2022-04-09 – 2022-04-12 (×4): 50 ug via INTRAVENOUS
  Filled 2022-04-09 (×5): qty 5

## 2022-04-09 MED ORDER — DEXTROSE 5 % IV SOLN: INTRAVENOUS | Status: DC

## 2022-04-09 NOTE — Consult Note (Signed)
Brief Psychiatry Consult Note  Saw pt - has been waxing and waning through the day. Much looser than yesterday in the arms, legs still wound up in bizarre posture. Did not attempt to move his legs. He attempted to communicate several times when I was in the room - would get 3-4 words out and then trail off. He was not agitated when I was in the room.  Had long conversation with wife. Discussed pt's rapid decline in the last several months. She is leaning towards hospice [facility]. No need to change meds today - seems marginally improved from yesterday overall.   Jaylon Boylen A Rena Sweeden

## 2022-04-09 NOTE — Consult Note (Signed)
Palliative Care Consult Note                                  Date: 04/09/2022   Patient Name: [name]  DOB: [birth year]  MRN: [id number]  Age / Sex: 76 y.o., male  PCP: Mahlon Gammon, MD Referring Physician: Leatha Gilding, MD  Reason for Consultation: Establishing goals of care  HPI/Patient Profile: Palliative Care consult requested for goals of care discussion in this 76 y.o. male  with past medical history documented below. He was admitted on 04/06/2022 from [facility] facility with increased agitation. Subsequently patient was seen at [facility] regional 2 weeks ago and discharged back to facility after medication adjustment.  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 60 Gy over 30 fractions.    Hypertension    Hypogonadism male    Prostate cancer ([facility]) 06/07/11   gleason 3+3=6, vol 59.5 cc   Sleep apnea    Status post chemotherapy    testicular cancer 1992, Dr Cyndie Chime     Subjective:   This NP Royal Hawthorn reviewed medical records, received report from team, assessed the patient and then met at the patient's bedside with patient's wife, [name] to discuss diagnosis, prognosis, GOC, EOL wishes disposition and options.  [name] is resting.  Will occasionally open his eyes.  Confused and unable to engage in discussions appropriately.   Concept of Palliative Care was introduced as specialized medical care for people and their families living with serious illness.  It focuses on providing relief from the symptoms and stress of a serious illness.  The  goal is to improve quality of life for both the patient and the family. Values and goals of care important to patient and family were attempted to be elicited.  I created space and opportunity for family to explore state of health prior to admission, thoughts, and feelings.   [name] [facility].  Prior to placement there he was at home with her.  They have been married for more than 21 years.  No children.  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  Enjoys sports specifically golf, football, basketball.  Also enjoyed reading.  Prior to admission wife reports patient's health has been declining for several months.  Due to the advancement of his dementia he was unable to be cared for safely at home which prompted placement.  He most recently over the past several weeks [facility] to frequent falls and patient climbing over patio walls.  Wife also reports patient has been experiencing hallucinations and has episodes of verbal aggression.  Appetite has significantly decreased over the past month.  She estimates he has lost approximately 20 pounds.  Also with some difficulty with patient taking medications.  We discussed His current illness and what  it means in the larger context of His on-going co-morbidities. Natural disease trajectory and expectations were discussed.  [name] of current illness and co-morbidities.  She expresses she is realistic in her Mathis however initially had hope that after medication adjustments patient will return back to baseline and continue to thrive again.  She is emotional stating her "hopes have been crushed".  Emotional support provided.  She is clear in her expressed wishes to continue to take things 1 day at a time while treating the treatable allow every opportunity for patient to show some  improvement.  We discussed best case and worst-case scenario.  Wife is able to recognize despite all medical interventions thus far patient has not shown any signs of meaningful recovery.  She wishes to continue to watch over the next 24-48 hours prior to making any significant decisions.  She is emotional expressing they are all each other has.  Emotional support provided.  I discussed the importance of continued conversation with family and their medical providers regarding overall plan of care and treatment options, ensuring decisions are within the context of the patients values and GOCs.  Questions and concerns were addressed.  Hard Choices booklet left for review. The family was encouraged to call with questions or concerns.  PMT will continue to support holistically as needed.   Objective:   Primary Diagnoses: Present on Admission:  Hypernatremia  Alzheimer's dementia with behavioral disturbance ([facility])   Scheduled Meds:  donepezil  10 mg Oral QHS   enoxaparin (LOVENOX) injection  30 mg Subcutaneous Q24H   ferrous gluconate  324 mg Oral Q breakfast   folic acid  1 mg Intravenous Daily   levothyroxine  50 mcg Intravenous Daily   memantine  10 mg Oral BID   mirtazapine  15 mg Oral QHS   OLANZapine  2.5 mg Oral QHS   [START ON 04/16/2022] thiamine (VITAMIN B1) injection  100 mg Intravenous Daily    Continuous Infusions:  dextrose 75 mL/hr at 04/09/22 1020   thiamine (VITAMIN B1) injection 500 mg (04/09/22 1409)   Followed by   Derrill Memo ON 04/10/2022] thiamine (VITAMIN B1) injection     valproate sodium 500 mg (04/09/22 1019)    PRN Meds: acetaminophen **OR** acetaminophen, hydrALAZINE, LORazepam, OLANZapine zydis **OR** OLANZapine  No Known Allergies  Review of Systems  Unable to perform ROS: Dementia   Physical Exam General: Cachectic, frail chronically-ill appearing Cardiovascular: regular rate and rhythm Pulmonary:diminished bilaterally  Abdomen: soft, nontender, +  bowel sounds Extremities: no edema, contracture Skin: no rashes, warm and dry Neurological: Somnolent, confused, somewhat agitated at times when awake  Vital Signs:  BP (!) 87/46 (BP Location: Right Arm)   Pulse 84   Temp 98.6 F (37 C) (Axillary)   Resp 16   Ht $R'5\' 6"'FY$  (1.676 m)   Wt 41.5 kg   SpO2 100%   BMI 14.77 kg/m  Pain Scale: Faces   Pain Score: 0-No pain  SpO2: SpO2: 100 % O2 Device:SpO2: 100 % O2 Flow Rate: .O2 Flow Rate (L/min): 1 L/min  IO: Intake/output summary:  Intake/Output Summary (Last 24 hours) at 04/09/2022 1459 Last data filed at 04/09/2022 1421 Gross per 24 hour  Intake 3311.46 ml  Output 650 ml  Net 2661.46 ml    LBM: Last BM Date : 04/08/22 Baseline Weight: Weight: 41.5 kg Most recent weight: Weight: 41.5 kg      Palliative Assessment/Data: PPS 20-30%   Advanced Care Planning:   Primary Decision Maker:  HCPOA-wife  Code Status/Advance Care Planning: Limited code  A discussion was had today regarding advanced directives. Concepts specific to code status, artifical feeding and hydration, continued IV antibiotics and rehospitalization was had.  The difference between a aggressive medical intervention path and a palliative comfort care path was discussed.   I discussed at length patient's current CODE STATUS.  Wife states she would not want CPR, defibrillation, or intubation.  At this time she is okay with rescue medications.  Is considering DNR/DNI with Mathis and patient had a sudden cardiopulmonary event given his poor prognosis and current condition chances for survival are minimal.  We discussed patient's poor overall nutrition and functional state.  Education provided on artificial feedings with recommendations against this in the setting of advanced dementia and agitation knowing this would not provide any meaningful recovery and to further escalate agitation and behaviors.  Also at high risk of dislodging by patient.  Wife verbalized no  artificial feedings or life prolonging measures.  Hospice and Palliative Care services outpatient were explained and offered.  Wife verbalized Mathis and awareness of both palliative and hospice's goals and philosophy of care.   Recommendations provided on consideration to focus on patient's comfort given his current quality of life and poor long-term prognosis.  We discussed at length residential hospice placement, home with hospice support, and [facility].  Wife verbalized Mathis and if no improvement she is open to continuing discussions expressing what most likely be most interested in hospice facility if this became her final decision to focus on comfort.  She Mathis previous experience at [facility].  Education provided on criteria for residential hospice.   Assessment & Plan:   SUMMARY OF RECOMMENDATIONS   Limited code-no CPR, no intubation, no BiPAP, rescue medications only as confirmed by wife.  Considering DNR/DNI.  No artificial feeding. Continue with current plan of care  Extensive goals of care discussions.  Wife is clear in her expressed wishes to continue to treat the treatable allowing patient every opportunity to continue to thrive however if no improvement over the next 24-48 hours will be more open to discussing comfort measures and hospice.  Would like to take things 1 day at a time.  She is realistic in her Mathis of patient's overall condition.  Education has been provided on hospice's goals and philosophy of care and outpatient options. Ongoing goals of care discussions PMT will continue to support and follow. Please secure chat with urgent needs.  Symptom Management:  Per Attending  Palliative Prophylaxis:  Aspiration, Bowel Regimen, Delirium Protocol, Frequent Pain Assessment, Oral Care, Palliative Wound Care, and Turn Reposition  Additional Recommendations (Limitations, Scope, Preferences): No Artificial Feeding and no CPR,  no intubation  Psycho-social/Spiritual:  Desire for further Chaplaincy support: no Additional Recommendations: Education on Hospice  Prognosis:  Poor  Discharge Planning:  To Be Determined   Patient's wife expressed Mathis and was in agreement with this plan.    Time Total:70 min  Visit consisted of counseling and education dealing with the complex and emotionally intense issues of symptom management and palliative care in the setting of serious and potentially life-threatening illness.Greater than 50%  of this time was spent counseling and coordinating care related to the above assessment and plan.  Signed by:  Alda Lea, AGPCNP-BC Palliative Medicine Team  Phone: [phone] Pager: [phone] Amion: Bjorn Pippin   Thank you for allowing the Palliative Medicine Team to assist in the care of this patient. Please utilize secure chat  with additional questions, if there is no response within 30 minutes please call the above phone number. Palliative Medicine Team providers are available by phone from 7am to 5pm daily and can be reached through the team cell phone.  Should this patient require assistance outside of these hours, please call the patient's attending physician.

## 2022-04-09 NOTE — Plan of Care (Signed)

## 2022-04-09 NOTE — Progress Notes (Signed)
PROGRESS NOTE  HOLDAN STUCKE ACZ:[id number] DOB: 06-19-1946 DOA: 04/06/2022 PCP: Virgie Dad, MD   LOS: 3 days   Brief Narrative / Interim history: 76 y.o. male with known history of Alzheimer's dementia was brought to the ER after patient became increasingly agitated with poor oral intake.  Patient was brought to the ER about 2 weeks ago at [facility] at that time patient's medications were adjusted and patient was discharged back to skilled nursing facility.  Patient has been living at the skilled nursing facility for over a month now.  Patient has been progressively getting more agitated.  Over the last few days has not eaten well.  He was found to have hypernatremia and admitted to the hospital.  Hospital course complicated by persistent agitation  Subjective / 24h Interval events: Remains confused  Assesement and Plan: Principal problem Hypernatremia-due to poor p.o. 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  Sodium improved, finally normalized at 140.  Reduce the rate of D5 W  Active problems Advanced dementia, with behavioral problems-patient has been declining more so over the last 6 months, and when he and her wife moved from their condo to independent living he had a more rapid decline.  At 1 point, about a month ago, he was so weak that he had moved to the skilled nursing portion of their facility where he declined even faster.  He has been having increased hallucinations, very poor p.o. intake.  Psychiatry consulted and following.  Currently on Remeron and olanzapine at night. -Discussed with Dr. Casimiro Needle as well, patient's primary psychiatrist, [facility] -Neurology also concerned about potential thiamine deficiency due to poor p.o. 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  Leonel Ramsay, this may need to be addressed in the future only if the patient regain some function, becomes ambulatory, and has good p.o. intake.  Goals of care-discussed with the wife at bedside, that he may be approaching end-stage dementia and as a natural course of this disease may stop eating or drinking altogether.  She is hopeful for some recovery from this current hospitalization to the point that his hallucinations would be a little bit better and then he will have some p.o. intake, but understands that if he is not responding to treatment and there is really no improvement, hospice may be the next appropriate step.  Palliative care to see them as well today  Hypertensive urgency-blood pressure is now normalized  Hypothyroidism-continue Synthroid, TSH was checked on 8/31 and he was found to be elevated 12.6.  This is likely due to either poor p.o. absorption or simply that he is not taking his meds consistently.  Currently has been placed on IV Synthroid with hopes that it would reverse some of his worsening mentation  Folate deficiency-continue folate supplementation  Iron deficiency-has been placed on iron  Hypokalemia-continue to replete, potassium 2.9 this morning.  Macrocytic anemia, thrombocytopenia-replete potassium and folic acid.  B12 is 1039.  Severe protein calorie malnutrition-BMI 14.7, in the setting of advanced dementia  Scheduled Meds:  donepezil  10 mg Oral QHS   enoxaparin (LOVENOX) injection  30 mg Subcutaneous Q24H   ferrous gluconate  324 mg Oral Q breakfast   folic acid  1 mg Intravenous Daily   levothyroxine  50 mcg Intravenous Daily   memantine  10 mg Oral BID  mirtazapine  15 mg Oral QHS   OLANZapine  2.5 mg Oral QHS   potassium chloride  40 mEq Oral Once   [START ON 04/16/2022] thiamine (VITAMIN B1) injection  100 mg Intravenous Daily   Continuous Infusions:  thiamine (VITAMIN B1) injection 100 mL/hr at 04/09/22 0554   Followed by   Derrill Memo ON  04/10/2022] thiamine (VITAMIN B1) injection     valproate sodium Stopped (04/08/22 2359)   PRN Meds:.acetaminophen **OR** acetaminophen, hydrALAZINE, LORazepam, OLANZapine zydis **OR** OLANZapine  Diet Orders (From admission, onward)     Start     Ordered   04/06/22 2036  Diet Heart Room service appropriate? Yes; Fluid consistency: Thin  Diet effective now       Comments: When patient is alert and awake.  Question Answer Comment  Room service appropriate? Yes   Fluid consistency: Thin      04/06/22 2037            DVT prophylaxis: enoxaparin (LOVENOX) injection 30 mg Start: 04/06/22 2200   Lab Results  Component Value Date   PLT 130 (L) 04/09/2022      Code Status: Partial Code  Family Communication: Wife present at bedside  Status is: Inpatient Remains inpatient appropriate because: Agitation, hypernatremia   Level of care: Telemetry  Consultants:  Palliative care  Objective: Vitals:   04/08/22 1454 04/08/22 1456 04/08/22 2134 04/09/22 0433  BP: (!) 100/58  (!) 94/54 (!) 105/59  Pulse: 76  78 77  Resp:   16 15  Temp:  98.2 F (36.8 C) 99.6 F (37.6 C) 98.7 F (37.1 C)  TempSrc:  Oral Axillary Oral  SpO2:   99% 99%  Weight:      Height:        Intake/Output Summary (Last 24 hours) at 04/09/2022 0941 Last data filed at 04/09/2022 4650 Gross per 24 hour  Intake 3311.46 ml  Output 250 ml  Net 3061.46 ml    Wt Readings from Last 3 Encounters:  04/06/22 41.5 kg  04/06/22 41.5 kg  04/05/22 41.5 kg    Examination:  Constitutional: Cachectic appearing Eyes: lids and conjunctivae normal, no scleral icterus ENMT: mmm Neck: normal, supple Respiratory: clear to auscultation bilaterally, no wheezing, no crackles.  Cardiovascular: Regular rate and rhythm, no murmurs / rubs / gallops. No LE edema. Abdomen: soft, no distention, no tenderness. Bowel sounds positive.  Skin: no rashes   Data Reviewed: I have independently reviewed following labs and imaging  studies   CBC Recent Labs  Lab 04/06/22 1730 04/07/22 0229 04/09/22 0500  WBC 3.0* 3.4* 3.8*  HGB 14.3 13.5 10.5*  HCT 46.7 41.6 32.0*  PLT 201 208 130*  MCV 108.4* 102.2* 99.7  MCH 33.2 33.2 32.7  MCHC 30.6 32.5 32.8  RDW 15.1 15.1 14.7  LYMPHSABS 1.0 0.8  --   MONOABS 0.3 0.4  --   EOSABS 0.1 0.1  --   BASOSABS 0.1 0.0  --      Recent Labs  Lab 04/06/22 1730 04/06/22 1735 04/06/22 1903 04/07/22 0229 04/07/22 1237 04/07/22 2107 04/08/22 0023 04/08/22 1615 04/09/22 0500  NA 160*  --   --    < > 152* 151* 151* 150* 140  K 4.3  --   --    < > 4.3 3.4* 3.5 3.7 2.9*  CL >130*  --   --    < > 121* 121* 122* 119* 110  CO2 28  --   --    < > 26  $'24 26 25 24  'P$ GLUCOSE 102*  --   --    < > 97 98 94 123* 135*  BUN 41*  --   --    < > 32* 29* 30* 30* 29*  CREATININE 0.88  --   --    < > 0.64 0.56* 0.63 0.93 0.68  CALCIUM 9.2  --   --    < > 8.6* 8.6* 8.5* 8.8* 7.9*  AST 40  --   --   --   --   --   --   --   --   ALT 39  --   --   --   --   --   --   --   --   ALKPHOS 79  --   --   --   --   --   --   --   --   BILITOT 0.9  --   --   --   --   --   --   --   --   ALBUMIN 3.4*  --   --   --   --   --   --   --   --   MG  --   --   --   --   --   --   --   --  2.1  TSH  --  12.651*  --   --   --   --   --   --   --   AMMONIA  --   --  33  --   --   --   --   --   --    < > = values in this interval not displayed.     ------------------------------------------------------------------------------------------------------------------ No results for input(s): "CHOL", "HDL", "LDLCALC", "TRIG", "CHOLHDL", "LDLDIRECT" in the last 72 hours.  No results found for: "HGBA1C" ------------------------------------------------------------------------------------------------------------------ Recent Labs    04/06/22 1735  TSH 12.651*     Cardiac Enzymes No results for input(s): "CKMB", "TROPONINI", "MYOGLOBIN" in the last 168 hours.  Invalid input(s):  "CK" ------------------------------------------------------------------------------------------------------------------ No results found for: "BNP"  CBG: Recent Labs  Lab 04/08/22 0455 04/08/22 1152 04/08/22 1820 04/08/22 2317 04/09/22 0601  GLUCAP 107* 119* 127* 127* 131*     Recent Results (from the past 240 hour(s))  Urine Culture     Status: Abnormal   Collection Time: 04/06/22  8:20 PM   Specimen: Urine, Clean Catch  Result Value Ref Range Status   Specimen Description   Final    URINE, CLEAN CATCH Performed at [facility], [place] [address], [place], [place] [address]    Special Requests   Final    NONE Performed at [facility], [place] [address] [place], [place] [address]    Culture (A)  Final    >=100,000 COLONIES/mL DIPHTHEROIDS(CORYNEBACTERIUM SPECIES) Standardized susceptibility testing for this organism is not available. Performed at [facility], [place] [address] [place], [place] [address]    Report Status 04/08/2022 FINAL  Final     Radiology Studies: No results found.   Marzetta Board, MD, PhD [facility]  Between 7 am - 7 pm I am available, please contact me via Amion (for emergencies) or Securechat (non urgent messages)  Between 7 pm - 7 am I am not available, please contact night coverage MD/APP via Amion

## 2022-04-10 ENCOUNTER — Inpatient Hospital Stay (HOSPITAL_COMMUNITY): Payer: Medicare Other

## 2022-04-10 DIAGNOSIS — Z7189 Other specified counseling: Secondary | ICD-10-CM | POA: Diagnosis not present

## 2022-04-10 DIAGNOSIS — G309 Alzheimer's disease, unspecified: Secondary | ICD-10-CM

## 2022-04-10 DIAGNOSIS — E870 Hyperosmolality and hypernatremia: Secondary | ICD-10-CM | POA: Diagnosis not present

## 2022-04-10 DIAGNOSIS — R4182 Altered mental status, unspecified: Secondary | ICD-10-CM | POA: Diagnosis not present

## 2022-04-10 DIAGNOSIS — F323 Major depressive disorder, single episode, severe with psychotic features: Secondary | ICD-10-CM

## 2022-04-10 LAB — CBC
HCT: 32.3 % — ABNORMAL LOW (ref 39.0–52.0)
Hemoglobin: 11.3 g/dL — ABNORMAL LOW (ref 13.0–17.0)
MCH: 33.9 pg (ref 26.0–34.0)
MCHC: 35 g/dL (ref 30.0–36.0)
MCV: 97 fL (ref 80.0–100.0)
Platelets: UNDETERMINED 10*3/uL (ref 150–400)
RBC: 3.33 MIL/uL — ABNORMAL LOW (ref 4.22–5.81)
RDW: 14.7 % (ref 11.5–15.5)
WBC: 3.6 10*3/uL — ABNORMAL LOW (ref 4.0–10.5)
nRBC: 0 % (ref 0.0–0.2)

## 2022-04-10 LAB — COMPREHENSIVE METABOLIC PANEL
ALT: 190 U/L — ABNORMAL HIGH (ref 0–44)
AST: 236 U/L — ABNORMAL HIGH (ref 15–41)
Albumin: 2.6 g/dL — ABNORMAL LOW (ref 3.5–5.0)
Alkaline Phosphatase: 61 U/L (ref 38–126)
Anion gap: 4 — ABNORMAL LOW (ref 5–15)
BUN: 17 mg/dL (ref 8–23)
CO2: 25 mmol/L (ref 22–32)
Calcium: 8.4 mg/dL — ABNORMAL LOW (ref 8.9–10.3)
Chloride: 113 mmol/L — ABNORMAL HIGH (ref 98–111)
Creatinine, Ser: 0.61 mg/dL (ref 0.61–1.24)
GFR, Estimated: >60 mL/min (ref >60)
Glucose, Bld: 95 mg/dL (ref 70–99)
Potassium: 3.9 mmol/L (ref 3.5–5.1)
Sodium: 142 mmol/L (ref 135–145)
Total Bilirubin: 0.7 mg/dL (ref 0.3–1.2)
Total Protein: 5.1 g/dL — ABNORMAL LOW (ref 6.5–8.1)

## 2022-04-10 LAB — VALPROIC ACID LEVEL: Valproic Acid Lvl: 51 ug/mL (ref 50.0–100.0)

## 2022-04-10 LAB — GLUCOSE, CAPILLARY
Glucose-Capillary: 111 mg/dL — ABNORMAL HIGH (ref 70–99)
Glucose-Capillary: 87 mg/dL (ref 70–99)
Glucose-Capillary: 88 mg/dL (ref 70–99)
Glucose-Capillary: 97 mg/dL (ref 70–99)

## 2022-04-10 LAB — CORTISOL: Cortisol, Plasma: 14 ug/dL

## 2022-04-10 LAB — AMMONIA: Ammonia: 14 umol/L (ref 9–35)

## 2022-04-10 LAB — MAGNESIUM: Magnesium: 2.2 mg/dL (ref 1.7–2.4)

## 2022-04-10 NOTE — TOC Progression Note (Addendum)
Transition of Care [facility]) - Progression Note    Patient Details  Name: [name] MRN: [id number] Date of Birth: 06/15/1946  [facility] CM/SW Contact  Ross Ludwig, [place] Phone Number: 04/10/2022, 2:49 PM  Clinical Narrative:     Per psych patient does not qualify for inpatient geripsych due to patient not being ambulatory and having dementia.  Plan to return back to [facility] SNF.   Patient is a Level 2 [place] screen, requested clinicals have been uploaded.  Awaiting [place] number.  Expected Discharge Plan: [place] Barriers to Discharge: Continued Medical Work up  Expected Discharge Plan and Services Expected Discharge Plan: [place] In-house Referral: Clinical Social Work   Post Acute Care Choice: [place] Living arrangements for the past 2 months: [place]                 DME Arranged: N/A DME Agency: NA                   Social Determinants of Health (SDOH) Interventions    Readmission Risk Interventions     No data to display

## 2022-04-10 NOTE — Progress Notes (Signed)
PROGRESS NOTE  [name] ZOX:[id number] DOB: Nov 23, 1945 DOA: 04/06/2022 PCP: Virgie Dad, MD   LOS: 4 days   Brief Narrative / Interim history: 76 y.o. male with known history of Alzheimer's dementia was brought to the ER after patient became increasingly agitated with poor oral intake.  Patient was brought to the ER about 2 weeks ago at [facility] at that time patient's medications were adjusted and patient was discharged back to skilled nursing facility.  Patient has been living at the skilled nursing facility for over a month now.  Patient has been progressively getting more agitated.  Over the last few days has not eaten well.  He was found to have hypernatremia and admitted to the hospital.  Hospital course complicated by persistent agitation  Subjective / 24h Interval events: He is a little bit more alert today, more coherent  Assesement and Plan: Principal problem Hypernatremia-due to poor p.o. 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  Sodium improved, normalized yesterday and today.  Stop IV fluids and allow p.o. intake and see what sodium levels will do.  Active problems Advanced dementia, with behavioral problems-patient has been declining more so over the last 6 months, and when he and her wife moved from their condo to independent living he had a more rapid decline.  At 1 point, about a month ago, he was so weak that he had moved to the skilled nursing portion of their facility where he declined even faster.  He has been having increased hallucinations, very poor p.o. intake.  Psychiatry consulted and following.  Currently on Remeron and olanzapine at night. -Discussed with Dr. Casimiro Needle as well, patient's primary psychiatrist, [facility] -Neurology also concerned about potential thiamine deficiency due to poor p.o. intake, started on supplementation -Seems to be a bit better  today  Elevated liver enzymes-mild, no clear culprit, right upper quadrant ultrasound unremarkable.  All of his antipsychotics can do this.  Continue to monitor.  Concern for normal pressure hydrocephalus -based on the CT scan, neurology evaluated patient, discussed with Dr. Leonel Ramsay, this may need to be addressed in the future only if the patient regain some function, becomes ambulatory, and has good p.o. intake.  Goals of care-discussed with the wife at bedside, that he may be approaching end-stage dementia and as a natural course of this disease may stop eating or drinking altogether.  She is hopeful for some recovery from this current hospitalization to the point that his hallucinations would be a little bit better and then he will have some p.o. intake, but understands that if he is not responding to treatment and there is really no improvement, hospice may be the next appropriate step.  Stop fluids today  Hypertensive urgency-blood pressure is now normalized, soft at times  Hypothyroidism-continue Synthroid, TSH was checked on 8/31 and he was found to be elevated 12.6.  This is likely due to either poor p.o. absorption or simply that he is not taking his meds consistently.  Continue IV Synthroid  Folate deficiency-continue folate supplementation  Iron deficiency-has been placed on iron  Hypokalemia-continue to replete, potassium 2.9 this morning.  Macrocytic anemia, thrombocytopenia-replete potassium and folic acid.  B12 is 1039.  Severe protein calorie malnutrition-BMI 14.7, in the setting of advanced dementia  Scheduled Meds:  donepezil  10 mg Oral QHS   enoxaparin (LOVENOX) injection  30 mg Subcutaneous Q24H   ferrous gluconate  324  mg Oral Q breakfast   folic acid  1 mg Intravenous Daily   levothyroxine  50 mcg Intravenous Daily   memantine  10 mg Oral BID   mirtazapine  15 mg Oral QHS   OLANZapine  2.5 mg Oral QHS   [START ON 04/16/2022] thiamine (VITAMIN B1) injection   100 mg Intravenous Daily   Continuous Infusions:  thiamine (VITAMIN B1) injection     PRN Meds:.acetaminophen **OR** acetaminophen, hydrALAZINE, LORazepam, OLANZapine zydis **OR** OLANZapine  Diet Orders (From admission, onward)     Start     Ordered   04/06/22 2036  Diet Heart Room service appropriate? Yes; Fluid consistency: Thin  Diet effective now       Comments: When patient is alert and awake.  Question Answer Comment  Room service appropriate? Yes   Fluid consistency: Thin      04/06/22 2037            DVT prophylaxis: enoxaparin (LOVENOX) injection 30 mg Start: 04/06/22 2200   Lab Results  Component Value Date   PLT PLATELET CLUMPS NOTED ON SMEAR, UNABLE TO ESTIMATE 04/10/2022      Code Status: Partial Code  Family Communication: Wife present at bedside  Status is: Inpatient Remains inpatient appropriate because: Agitation, hypernatremia   Level of care: Telemetry  Consultants:  Palliative care  Objective: Vitals:   04/09/22 1418 04/09/22 1630 04/09/22 2058 04/10/22 0507  BP: (!) 87/46 112/67 139/87 (!) 165/61  Pulse: 84 (!) 41 87 77  Resp: '16  17 16  '$ Temp: 98.6 F (37 C)  98.7 F (37.1 C) 97.9 F (36.6 C)  TempSrc: Axillary  Oral Oral  SpO2: 100%  95%   Weight:      Height:        Intake/Output Summary (Last 24 hours) at 04/10/2022 1024 Last data filed at 04/10/2022 1000 Gross per 24 hour  Intake 1911.28 ml  Output 1350 ml  Net 561.28 ml    Wt Readings from Last 3 Encounters:  04/06/22 41.5 kg  04/06/22 41.5 kg  04/05/22 41.5 kg    Examination:  Constitutional: Cachectic appearing Eyes: lids and conjunctivae normal, no scleral icterus ENMT: mmm Neck: normal, supple Respiratory: clear to auscultation bilaterally, no wheezing, no crackles. Normal respiratory effort.  Cardiovascular: Regular rate and rhythm, no murmurs / rubs / gallops. No LE edema. Abdomen: soft, no distention, no tenderness. Bowel sounds positive.  Skin: no  rashes Neurologic: no focal deficits, equal strength   Data Reviewed: I have independently reviewed following labs and imaging studies   CBC Recent Labs  Lab 04/06/22 1730 04/07/22 0229 04/09/22 0500 04/10/22 0344  WBC 3.0* 3.4* 3.8* 3.6*  HGB 14.3 13.5 10.5* 11.3*  HCT 46.7 41.6 32.0* 32.3*  PLT 201 208 130* PLATELET CLUMPS NOTED ON SMEAR, UNABLE TO ESTIMATE  MCV 108.4* 102.2* 99.7 97.0  MCH 33.2 33.2 32.7 33.9  MCHC 30.6 32.5 32.8 35.0  RDW 15.1 15.1 14.7 14.7  LYMPHSABS 1.0 0.8  --   --   MONOABS 0.3 0.4  --   --   EOSABS 0.1 0.1  --   --   BASOSABS 0.1 0.0  --   --      Recent Labs  Lab 04/06/22 1730 04/06/22 1735 04/06/22 1903 04/07/22 0229 04/07/22 2107 04/08/22 0023 04/08/22 1615 04/09/22 0500 04/10/22 0344 04/10/22 0511  NA 160*  --   --    < > 151* 151* 150* 140  --  142  K 4.3  --   --    < >  3.4* 3.5 3.7 2.9*  --  3.9  CL >130*  --   --    < > 121* 122* 119* 110  --  113*  CO2 28  --   --    < > '24 26 25 24  '$ --  25  GLUCOSE 102*  --   --    < > 98 94 123* 135*  --  95  BUN 41*  --   --    < > 29* 30* 30* 29*  --  17  CREATININE 0.88  --   --    < > 0.56* 0.63 0.93 0.68  --  0.61  CALCIUM 9.2  --   --    < > 8.6* 8.5* 8.8* 7.9*  --  8.4*  AST 40  --   --   --   --   --   --   --   --  236*  ALT 39  --   --   --   --   --   --   --   --  190*  ALKPHOS 79  --   --   --   --   --   --   --   --  61  BILITOT 0.9  --   --   --   --   --   --   --   --  0.7  ALBUMIN 3.4*  --   --   --   --   --   --   --   --  2.6*  MG  --   --   --   --   --   --   --  2.1 2.2  --   TSH  --  12.651*  --   --   --   --   --   --   --   --   AMMONIA  --   --  33  --   --   --   --   --   --   --    < > = values in this interval not displayed.     ------------------------------------------------------------------------------------------------------------------ No results for input(s): "CHOL", "HDL", "LDLCALC", "TRIG", "CHOLHDL", "LDLDIRECT" in the last 72 hours.  No  results found for: "HGBA1C" ------------------------------------------------------------------------------------------------------------------ No results for input(s): "TSH", "T4TOTAL", "T3FREE", "THYROIDAB" in the last 72 hours.  Invalid input(s): "FREET3"   Cardiac Enzymes No results for input(s): "CKMB", "TROPONINI", "MYOGLOBIN" in the last 168 hours.  Invalid input(s): "CK" ------------------------------------------------------------------------------------------------------------------ No results found for: "BNP"  CBG: Recent Labs  Lab 04/09/22 0601 04/09/22 1132 04/09/22 1717 04/10/22 0002 04/10/22 0511  GLUCAP 131* 83 99 111* 88     Recent Results (from the past 240 hour(s))  Urine Culture     Status: Abnormal   Collection Time: 04/06/22  8:20 PM   Specimen: Urine, Clean Catch  Result Value Ref Range Status   Specimen Description   Final    URINE, CLEAN CATCH Performed at [facility], [place] [address] [place], [place] [address]    Special Requests   Final    NONE Performed at [facility], [place] [address], [place], [place] [address]    Culture (A)  Final    >=100,000 COLONIES/mL DIPHTHEROIDS(CORYNEBACTERIUM SPECIES) Standardized susceptibility testing for this organism is not available. Performed at [facility], [place] [address] [place], [place] [address]    Report Status 04/08/2022  FINAL  Final     Radiology Studies: US Abdomen Limited RUQ (LIVER/GB)  Result Date: 04/10/2022 CLINICAL DATA:  Elevated liver function test. EXAM: ULTRASOUND ABDOMEN LIMITED RIGHT UPPER QUADRANT COMPARISON:  None Available. FINDINGS: Gallbladder: Multiple gallstones. Largest 1.2 cm. Dependent sludge. No gallbladder wall thickening or pericholecystic fluid. No sonographic Murphy's sign. Common bile duct: Diameter: 1 mm. Liver: No focal lesion identified. Within normal limits in parenchymal echogenicity. Portal vein is patent on color Doppler  imaging with normal direction of blood flow towards the liver. Other: None. IMPRESSION: 1. No acute findings. 2. Cholelithiasis without evidence of acute cholecystitis. 3. Normal sonographic appearance of the liver. Electronically Signed   By: Lajean Manes M.D.   On: 04/10/2022 09:35     Marzetta Board, MD, PhD [facility]  Between 7 am - 7 pm I am available, please contact me via Amion (for emergencies) or Securechat (non urgent messages)  Between 7 pm - 7 am I am not available, please contact night coverage MD/APP via Amion

## 2022-04-10 NOTE — TOC PASRR Note (Signed)
CN:[id number] [name]  Date of Birth:November 19, 1945  Date:04/10/2022  To Whom It May Concern:  Please be advised that the above-named patient has a primary diagnosis of dementia which supersedes any psychiatric diagnosis.   Evette Cristal, MSW, Marlinda Mike [phone]

## 2022-04-10 NOTE — NC FL2 (Signed)
[place] LEVEL OF CARE SCREENING TOOL     IDENTIFICATION  Patient Name: [name] Birthdate: 04-23-1946 Sex: male Admission Date (Current Location): 04/06/2022  [facility] and [place] Number:  Herbalist and Address:  [facility],  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   Alzheimer's dementia with behavioral disturbance ([place]) 09/11/2016   Essential tremor 09/11/2016   Depression, psychotic ([place]) 10/19/2014    Orientation RESPIRATION BLADDER Height & Weight     Self  O2 (1L) Incontinent Weight: 91 lb 8 oz (41.5 kg) Height:  '5\' 6"'$  (167.6 cm)  BEHAVIORAL SYMPTOMS/MOOD NEUROLOGICAL BOWEL NUTRITION STATUS      Incontinent Diet (Cardiac)  AMBULATORY STATUS COMMUNICATION OF NEEDS Skin   Independent Verbally Normal                       Personal Care Assistance Level of Assistance  Bathing, Feeding, Dressing Bathing Assistance: Limited assistance Feeding assistance: Limited assistance Dressing Assistance: Limited assistance     Functional Limitations Info  Sight, Hearing, Speech Sight Info: Adequate Hearing Info: Adequate Speech Info: Adequate    SPECIAL CARE FACTORS FREQUENCY  PT (By licensed PT), OT (By licensed OT)     PT Frequency: Minimum 5x a week OT Frequency: Minimum 5x a week            Contractures  Contractures Info: Not present    Additional Factors Info  Code Status, Allergies, Psychotropic Code Status Info: Partial Allergies Info: No Known Allergies Psychotropic Info: donepezil (ARICEPT) tablet 10 mg, mirtazapine (REMERON SOL-TAB) disintegrating tablet 15 mg, OLANZapine (ZYPREXA) tablet 2.5 mg         Current Medications (04/10/2022):  This is the current hospital active medication list Current Facility-Administered Medications  Medication Dose Route Frequency Provider Last Rate Last Admin   acetaminophen (TYLENOL) tablet 650 mg  650 mg Oral Q6H PRN Rise Patience, MD   650 mg at 04/07/22 1942   Or   acetaminophen (TYLENOL) suppository 650 mg  650 mg Rectal Q6H PRN Rise Patience, MD       donepezil (ARICEPT) tablet 10 mg  10 mg Oral QHS Rise Patience, MD   10 mg at 04/09/22 2232   enoxaparin (LOVENOX) injection 30 mg  30 mg Subcutaneous Q24H Rise Patience, MD   30 mg at 04/09/22 2232   ferrous gluconate (FERGON) tablet 324 mg  324 mg Oral Q breakfast Caren Griffins, MD   324 mg at 15/40/08 6761   folic acid injection 1 mg  1 mg Intravenous Daily Caren Griffins, MD   1 mg at 04/09/22 1013   hydrALAZINE (APRESOLINE) injection 5 mg  5 mg Intravenous Q4H PRN Rise Patience, MD   5 mg at 04/06/22 2251   levothyroxine (SYNTHROID, LEVOTHROID) injection 50 mcg  50 mcg Intravenous  Daily Caren Griffins, MD   50 mcg at 04/10/22 8110   LORazepam (ATIVAN) tablet 0.5 mg  0.5 mg Oral BID PRN Rise Patience, MD   0.5 mg at 04/09/22 1128   memantine (NAMENDA) tablet 10 mg  10 mg Oral BID Rise Patience, MD   10 mg at 04/10/22 3159   mirtazapine (REMERON SOL-TAB) disintegrating tablet 15 mg  15 mg Oral QHS Rise Patience, MD   15 mg at 04/09/22 2232   OLANZapine zydis (ZYPREXA) disintegrating tablet 2.5 mg  2.5 mg Oral TID PRN Cinderella, Margaret A       Or   OLANZapine (ZYPREXA) injection 2.5 mg  2.5 mg Intramuscular TID PRN Cinderella,  Margaret A       OLANZapine (ZYPREXA) tablet 2.5 mg  2.5 mg Oral QHS Cinderella, Margaret A   2.5 mg at 04/09/22 2232   thiamine (VITAMIN B1) 250 mg in sodium chloride 0.9 % 50 mL IVPB  250 mg Intravenous Daily Beulah Gandy A, NP 100 mL/hr at 04/10/22 1537 250 mg at 04/10/22 1537   Followed by   Derrill Memo ON 04/16/2022] thiamine (VITAMIN B1) injection 100 mg  100 mg Intravenous Daily August Albino, NP         Discharge Medications: Please see discharge summary for a list of discharge medications.  Relevant Imaging Results:  Relevant Lab Results:   Additional Information SSN [id number]  Ross Ludwig, LCSW
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
## 2022-04-10 NOTE — Progress Notes (Signed)
   Palliative Medicine Inpatient Follow Up Note     Chart Reviewed. Patient assessed at the bedside.   Mr. Spielmann is much calmer today during my visit. Confusion noted (baseline), however is alert to his wife. No acute distress noted. Wife is feeding him lunch. She speaks to her appreciation of his change today versus other days. Expresses the "emotional roller coaster" due to the unknown and daily changes. We discussed taking things one day at a time and making decisions centered around Bill's quality of life.   She desires to do what is best for him while allowing him every opportunity to thrive if possible and not in a suffering state. Watchful waiting with close observation over the next 24-48 hours for improvement versus further decline.   Discussed the importance of continued conversation with family and their  medical providers regarding overall plan of care and treatment options, ensuring decisions are within the context of the patients values and GOCs.   Questions addressed and support provided.    Objective Assessment: Vital Signs Vitals:   04/10/22 0507 04/10/22 1339  BP: (!) 165/61 121/67  Pulse: 77 75  Resp: 16 16  Temp: 97.9 F (36.6 C) 98.9 F (37.2 C)  SpO2:      Intake/Output Summary (Last 24 hours) at 04/10/2022 1450 Last data filed at 04/10/2022 1400 Gross per 24 hour  Intake 1911.28 ml  Output 1150 ml  Net 761.28 ml   Last Weight  Most recent update: 04/06/2022  4:21 PM    Weight  41.5 kg (91 lb 8 oz)            Gen:  Cachectic  HEENT: moist mucous membranes CV: Regular rate and rhythm, no murmurs rubs or gallops PULM: clear to auscultation bilaterally. No wheezes/rales/rhonchi ABD: soft/nontender/nondistended/normal bowel sounds EXT: No edema, leg contractures  Neuro: impaired judgement, will follow some simple commands, alert to wife.   SUMMARY OF RECOMMENDATIONS   Continue with current plan of care per medical team  Limited code-no CPR, no  intubation, no BiPAP, rescue medications only as confirmed by wife.  Considering DNR/DNI.  No artificial feeding. Ongoing goals of care discussions. Watchful waiting over the next 24-48 hours PMT will continue to support and follow. Please secure chat for urgent needs.   Discussed with Dr. Cruzita Lederer  Time Total: 35 min   Visit consisted of counseling and education dealing with the complex and emotionally intense issues of symptom management and palliative care in the setting of serious and potentially life-threatening illness.Greater than 50%  of this time was spent counseling and coordinating care related to the above assessment and plan.  Alda Lea, AGPCNP-BC  Palliative Medicine Team [phone]  Palliative Medicine Team providers are available by phone from 7am to 7pm daily and can be reached through the team cell phone. Should this patient require assistance outside of these hours, please call the patient's attending physician.

## 2022-04-10 NOTE — Care Management Important Message (Signed)
Important Message  Patient Details IM Letter placed in Patients room for Family Name: [name] MRN: [id number] Date of Birth: 1946/07/01   Medicare Important Message Given:  Yes     Kerin Salen 04/10/2022, 1:25 PM

## 2022-04-11 ENCOUNTER — Encounter: Payer: Self-pay | Admitting: Neurology

## 2022-04-11 DIAGNOSIS — Z515 Encounter for palliative care: Secondary | ICD-10-CM | POA: Diagnosis not present

## 2022-04-11 DIAGNOSIS — R4182 Altered mental status, unspecified: Secondary | ICD-10-CM

## 2022-04-11 DIAGNOSIS — E870 Hyperosmolality and hypernatremia: Secondary | ICD-10-CM | POA: Diagnosis not present

## 2022-04-11 DIAGNOSIS — G309 Alzheimer's disease, unspecified: Secondary | ICD-10-CM | POA: Diagnosis not present

## 2022-04-11 DIAGNOSIS — F02818 Dementia in other diseases classified elsewhere, unspecified severity, with other behavioral disturbance: Secondary | ICD-10-CM

## 2022-04-11 DIAGNOSIS — Z7189 Other specified counseling: Secondary | ICD-10-CM | POA: Diagnosis not present

## 2022-04-11 LAB — BASIC METABOLIC PANEL
Anion gap: 6 (ref 5–15)
BUN: 21 mg/dL (ref 8–23)
CO2: 24 mmol/L (ref 22–32)
Calcium: 8.3 mg/dL — ABNORMAL LOW (ref 8.9–10.3)
Chloride: 114 mmol/L — ABNORMAL HIGH (ref 98–111)
Creatinine, Ser: 0.52 mg/dL — ABNORMAL LOW (ref 0.61–1.24)
GFR, Estimated: >60 mL/min (ref >60)
Glucose, Bld: 91 mg/dL (ref 70–99)
Potassium: 3.7 mmol/L (ref 3.5–5.1)
Sodium: 144 mmol/L (ref 135–145)

## 2022-04-11 LAB — CBC
HCT: 37.5 % — ABNORMAL LOW (ref 39.0–52.0)
Hemoglobin: 12.8 g/dL — ABNORMAL LOW (ref 13.0–17.0)
MCH: 33.1 pg (ref 26.0–34.0)
MCHC: 34.1 g/dL (ref 30.0–36.0)
MCV: 96.9 fL (ref 80.0–100.0)
Platelets: 146 10*3/uL — ABNORMAL LOW (ref 150–400)
RBC: 3.87 MIL/uL — ABNORMAL LOW (ref 4.22–5.81)
RDW: 14.7 % (ref 11.5–15.5)
WBC: 4.2 10*3/uL (ref 4.0–10.5)
nRBC: 0 % (ref 0.0–0.2)

## 2022-04-11 LAB — GLUCOSE, CAPILLARY
Glucose-Capillary: 81 mg/dL (ref 70–99)
Glucose-Capillary: 85 mg/dL (ref 70–99)
Glucose-Capillary: 82 mg/dL (ref 70–99)

## 2022-04-11 LAB — MAGNESIUM: Magnesium: 2.4 mg/dL (ref 1.7–2.4)

## 2022-04-11 NOTE — Evaluation (Signed)
Physical Therapy Evaluation Patient Details Name: [name] MRN: [id number] DOB: August 21, 1945 Today's Date: 04/11/2022  History of Present Illness  76 y.o. male with a history of Alzheier's dementia with behavioral disturbance. Patient was brought to the ER about 2 weeks ago at [facility] at that time patient's medications were adjusted and patient was discharged back to skilled nursing facility.  Patient has been living at the skilled nursing facility for over a month now.  Patient has been progressively getting more agitated.  Over the last few days has not eaten well.  He was found to have hypernatremia and admitted to the hospital.  Hospital course complicated by persistent agitation.    Clinical Impression  [name] is 76 y.o. male admitted with above HPI and diagnosis. Patient is currently limited by functional impairments below (see PT problem list). Patient has been at [facility] for ~1 month and has declined over last month per spouse. He has significant LE contractures and is developing contractures of UE's as well. Pt easily agitated with initiation/attempt to complete PROM or mobility in bed. He currently requires Total care. He will need an air mattress to protect against skin breakdown and frequent turns/repositioning in bed. Will trial patient for ROM HEP for spouse/caregivers to assist with. Recommending LTC at memory care of geripsych inpatient setting. Acute PT will follow and progress as able.        Recommendations for follow up therapy are one component of a multi-disciplinary discharge planning process, led by the attending physician.  Recommendations may be updated based on patient status, additional functional criteria and insurance authorization.  Follow Up Recommendations Long-term institutional care without follow-up therapy Can patient physically be transported by private vehicle: No    Assistance Recommended at Discharge Frequent or  constant Supervision/Assistance  Patient can return home with the following  Two people to help with walking and/or transfers;Two people to help with bathing/dressing/bathroom;Assist for transportation;Help with stairs or ramp for entrance;Direct supervision/assist for financial management;Direct supervision/assist for medications management;Assistance with feeding;Assistance with cooking/housework    Equipment Recommendations None recommended by PT  Recommendations for Other Services       Functional Status Assessment Patient has had a recent decline in their functional status and/or demonstrates limited ability to make significant improvements in function in a reasonable and predictable amount of time     Precautions / Restrictions Precautions Precautions: Fall Precaution Comments: contractures Restrictions Weight Bearing Restrictions: No      Mobility  Bed Mobility Overal bed mobility: Needs Assistance             General bed mobility comments: unable to safely turn pt as he is agitated with mobility attempts, possibly related to pain from ROM. He requires Total Assist to roll.    Transfers                        Ambulation/Gait                  Stairs            Wheelchair Mobility    Modified Rankin (Stroke Patients Only)       Balance                                             Pertinent Vitals/Pain Pain Assessment Pain  Assessment: PAINAD Breathing: normal Negative Vocalization: occasional moan/groan, low speech, negative/disapproving quality Facial Expression: sad, frightened, frown Body Language: rigid, fists clenched, knees up, pushing/pulling away, strikes out Consolability: distracted or reassured by voice/touch PAINAD Score: 5 Pain Intervention(s): Limited activity within patient's tolerance, Monitored during session, Repositioned    Home Living Family/patient expects to be discharged to:: Skilled  nursing facility                   Additional Comments: pt is from [facility], is on the waitlist for memory care per wife. reports he was ambulating ~1 month ago and completing transfers until ~ 2 weeks ago.    Prior Function Prior Level of Function : Needs assist             Mobility Comments: pt only transfering since ~ 2wks ago and has not trasnferred in about 1 wk ADLs Comments: assist from staff at [facility]     Hand Dominance        Extremity/Trunk Assessment   Upper Extremity Assessment Upper Extremity Assessment:  (pt maintains flexed UE posture, able to extend at elbows, unable to test shoulder mobiltiy.)    Lower Extremity Assessment Lower Extremity Assessment:  (Pt limited with bil LE contractures, Unable to test Lt knee, Rt Knee limited to ~80* flexion and less (unable to extend). unable to test hip. PROM at bil ankles limited due to aggitation.)    Cervical / Trunk Assessment Cervical / Trunk Assessment: Kyphotic;Other exceptions Cervical / Trunk Exceptions: pt curled in fetal position  Communication   Communication: HOH;Other (comment) (cognitive impairments)  Cognition Arousal/Alertness: Awake/alert Behavior During Therapy: Flat affect Overall Cognitive Status: History of cognitive impairments - at baseline                                 General Comments: Alzheimer's dementia        General Comments      Exercises     Assessment/Plan    PT Assessment Patient needs continued PT services  PT Problem List Decreased strength;Decreased range of motion;Decreased activity tolerance;Decreased balance;Decreased mobility;Decreased cognition;Decreased knowledge of precautions;Impaired tone;Decreased skin integrity;Pain       PT Treatment Interventions Functional mobility training;Therapeutic activities;Therapeutic exercise;Balance training;Neuromuscular re-education;Cognitive remediation;Patient/family education    PT Goals  (Current goals can be found in the Care Plan section)  Acute Rehab PT Goals Patient Stated Goal: pt's wife wants to help him move if able and determine next steps in care PT Goal Formulation: With family Time For Goal Achievement: 04/25/22 Potential to Achieve Goals: Poor    Frequency Min 1X/week     Co-evaluation               AM-PAC PT "6 Clicks" Mobility  Outcome Measure Help needed turning from your back to your side while in a flat bed without using bedrails?: Total Help needed moving from lying on your back to sitting on the side of a flat bed without using bedrails?: Total Help needed moving to and from a bed to a chair (including a wheelchair)?: Total Help needed standing up from a chair using your arms (e.g., wheelchair or bedside chair)?: Total Help needed to walk in hospital room?: Total Help needed climbing 3-5 steps with a railing? : Total 6 Click Score: 6    End of Session   Activity Tolerance: Patient limited by pain;Treatment limited secondary to agitation Patient left: in bed;with call  bell/phone within reach;with family/visitor present Nurse Communication: Mobility status (need for air mattress and frequent rolling) PT Visit Diagnosis: Muscle weakness (generalized) (M62.81);Other abnormalities of gait and mobility (R26.89);Difficulty in walking, not elsewhere classified (R26.2);Other symptoms and signs involving the nervous system (R29.898)    Time: 2641-5830 PT Time Calculation (min) (ACUTE ONLY): 38 min   Charges:   PT Evaluation $PT Eval Low Complexity: 1 Low PT Treatments $Self Care/Home Management: 8-22        Verner Mould, DPT Acute Rehabilitation Services Office [phone] Pager [phone]  04/11/22 3:17 PM

## 2022-04-11 NOTE — Progress Notes (Addendum)
PROGRESS NOTE  [name]:[id number] DOB: 1946/04/23 DOA: 04/06/2022 PCP: Virgie Dad, MD   LOS: 5 days   Brief Narrative / Interim history: 76 y.o. male with known history of Alzheimer's dementia was brought to the ER after patient became increasingly agitated with poor oral intake.  Patient was brought to the ER about 2 weeks ago at [facility] at that time patient's medications were adjusted and patient was discharged back to skilled nursing facility.  Patient has been living at the skilled nursing facility for over a month now.  Patient has been progressively getting more agitated.  Over the last few days has not eaten well.  He was found to have hypernatremia and admitted to the hospital.  Hospital course complicated by persistent agitation  Subjective / 24h Interval events: Continues to get better.  He is talking more today, seems to be more alert and answering my questions more appropriately.  Wife is at bedside.  Assesement and Plan: Principal problem Hypernatremia-due to poor p.o. 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  Sodium improved, he has normalized.  Stop all IV fluids, allow p.o. intake.  I doubt that he will be able to eat and drink enough to sustain life, but will see.  This was discussed with the wife.  If he becomes hypernatremic again, probably best if he goes to residential hospice  Active problems Advanced dementia, with behavioral problems-patient has been declining more so over the last 6 months, and when he and her wife moved from their condo to independent living he had a more rapid decline.  At one point, about a month ago, he was so weak that he had moved to the skilled nursing portion of their facility where he declined even faster.  He has been having increased hallucinations, very poor p.o. intake and has not been ambulatory for the past 3 to 4 weeks.  Psychiatry consulted and following.  Currently on Remeron  and olanzapine at night. -Discussed with Dr. Casimiro Needle as well, patient's primary psychiatrist, [facility] not [facility] -Neurology also concerned about potential thiamine deficiency due to poor p.o. intake, started on supplementation, B1 levels pending -He continues to improve  Elevated liver enzymes-mild, no clear culprit, right upper quadrant ultrasound unremarkable.  All of his antipsychotics can do this.  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 Leonel Ramsay, this may need to be addressed in the future only if the patient regain some function, becomes ambulatory, and has good p.o. intake.  Goals of care-discussed with the wife at bedside, that he may be approaching end-stage dementia and as a natural course of this disease may stop eating or drinking altogether.  She is hopeful for some recovery from this current hospitalization to the point that his hallucinations would be a little bit better and then he will have some p.o. intake, but understands that if he is not responding to treatment and there is really no improvement, hospice may be the next appropriate step.  Now being watched off fluids  Hypertensive urgency-blood pressure is now normalized  Hypothyroidism-continue Synthroid, TSH was checked on 8/31 and he was found to be elevated 12.6.  This is likely due to either poor p.o. absorption or simply that he was not taking his meds consistently.  Per wife Synthroid was not administered correctly.  Continue IV Synthroid  Folate deficiency-continue folate supplementation  Iron deficiency-has been placed on iron  Hypokalemia-replete as indicated, potassium has now normalized at 3.7  Macrocytic anemia, thrombocytopenia-replete potassium and folic acid.  B12 is 1039.  Severe protein calorie malnutrition-BMI 14.7, in the setting of  advanced dementia  Scheduled Meds:  donepezil  10 mg Oral QHS   enoxaparin (LOVENOX) injection  30 mg Subcutaneous Q24H   ferrous gluconate  324 mg Oral Q breakfast   folic acid  1 mg Intravenous Daily   levothyroxine  50 mcg Intravenous Daily   memantine  10 mg Oral BID   mirtazapine  15 mg Oral QHS   OLANZapine  2.5 mg Oral QHS   [START ON 04/16/2022] thiamine (VITAMIN B1) injection  100 mg Intravenous Daily   Continuous Infusions:  thiamine (VITAMIN B1) injection Stopped (04/10/22 1607)   PRN Meds:.acetaminophen **OR** acetaminophen, hydrALAZINE, LORazepam, OLANZapine zydis **OR** OLANZapine  Diet Orders (From admission, onward)     Start     Ordered   04/06/22 2036  Diet Heart Room service appropriate? Yes; Fluid consistency: Thin  Diet effective now       Comments: When patient is alert and awake.  Question Answer Comment  Room service appropriate? Yes   Fluid consistency: Thin      04/06/22 2037            DVT prophylaxis: enoxaparin (LOVENOX) injection 30 mg Start: 04/06/22 2200   Lab Results  Component Value Date   PLT 146 (L) 04/11/2022      Code Status: Partial Code  Family Communication: Wife present at bedside  Status is: Inpatient Remains inpatient appropriate because: Agitation, hypernatremia   Level of care: Telemetry  Consultants:  Palliative care  Objective: Vitals:   04/10/22 0507 04/10/22 1339 04/10/22 2141 04/11/22 0506  BP: (!) 165/61 121/67 111/86 114/80  Pulse: 77 75 77 63  Resp: '16 16 17 17  '$ Temp: 97.9 F (36.6 C) 98.9 F (37.2 C) 98.8 F (37.1 C) (!) 97.5 F (36.4 C)  TempSrc: Oral Axillary Axillary Axillary  SpO2:      Weight:      Height:        Intake/Output Summary (Last 24 hours) at 04/11/2022 1037 Last data filed at 04/11/2022 1000 Gross per 24 hour  Intake 110 ml  Output 750 ml  Net -640 ml    Wt Readings from Last 3 Encounters:  04/06/22 41.5 kg  04/06/22 41.5 kg  04/05/22 41.5 kg     Examination:  Constitutional: Cachectic appearing male, alert Eyes: lids and conjunctivae normal, no scleral icterus ENMT: Dry mucous membranes Neck: normal, supple Respiratory: clear to auscultation bilaterally, no wheezing, no crackles. Normal respiratory effort.  Cardiovascular: Regular rate and rhythm, no murmurs / rubs / gallops. No LE edema. Abdomen: soft, no distention, no tenderness. Bowel sounds positive.  Skin: no rashes Neurologic: Curled up in bed, moves hands independently, does not follow commands consistently   Data Reviewed: I have independently reviewed following labs and imaging studies   CBC Recent Labs  Lab 04/06/22 1730 04/07/22 0229 04/09/22 0500 04/10/22 0344 04/11/22 0403  WBC 3.0* 3.4* 3.8* 3.6* 4.2  HGB 14.3 13.5 10.5* 11.3* 12.8*  HCT 46.7 41.6 32.0* 32.3* 37.5*  PLT 201 208 130* PLATELET CLUMPS NOTED ON SMEAR, UNABLE TO ESTIMATE 146*  MCV 108.4* 102.2* 99.7 97.0 96.9  MCH 33.2 33.2 32.7 33.9 33.1  MCHC 30.6 32.5 32.8 35.0 34.1  RDW 15.1 15.1 14.7 14.7 14.7  LYMPHSABS 1.0 0.8  --   --   --  MONOABS 0.3 0.4  --   --   --   EOSABS 0.1 0.1  --   --   --   BASOSABS 0.1 0.0  --   --   --      Recent Labs  Lab 04/06/22 1730 04/06/22 1735 04/06/22 1903 04/07/22 0229 04/08/22 0023 04/08/22 1615 04/09/22 0500 04/10/22 0344 04/10/22 0511 04/10/22 0957 04/11/22 0403  NA 160*  --   --    < > 151* 150* 140  --  142  --  144  K 4.3  --   --    < > 3.5 3.7 2.9*  --  3.9  --  3.7  CL >130*  --   --    < > 122* 119* 110  --  113*  --  114*  CO2 28  --   --    < > '26 25 24  '$ --  25  --  24  GLUCOSE 102*  --   --    < > 94 123* 135*  --  95  --  91  BUN 41*  --   --    < > 30* 30* 29*  --  17  --  21  CREATININE 0.88  --   --    < > 0.63 0.93 0.68  --  0.61  --  0.52*  CALCIUM 9.2  --   --    < > 8.5* 8.8* 7.9*  --  8.4*  --  8.3*  AST 40  --   --   --   --   --   --   --  236*  --   --   ALT 39  --   --   --   --   --   --   --  190*  --   --    ALKPHOS 79  --   --   --   --   --   --   --  61  --   --   BILITOT 0.9  --   --   --   --   --   --   --  0.7  --   --   ALBUMIN 3.4*  --   --   --   --   --   --   --  2.6*  --   --   MG  --   --   --   --   --   --  2.1 2.2  --   --  2.4  TSH  --  12.651*  --   --   --   --   --   --   --   --   --   AMMONIA  --   --  33  --   --   --   --   --   --  14  --    < > = values in this interval not displayed.     ------------------------------------------------------------------------------------------------------------------ No results for input(s): "CHOL", "HDL", "LDLCALC", "TRIG", "CHOLHDL", "LDLDIRECT" in the last 72 hours.  No results found for: "HGBA1C" ------------------------------------------------------------------------------------------------------------------ No results for input(s): "TSH", "T4TOTAL", "T3FREE", "THYROIDAB" in the last 72 hours.  Invalid input(s): "FREET3"   Cardiac Enzymes No results for input(s): "CKMB", "TROPONINI", "MYOGLOBIN" in the last 168 hours.  Invalid input(s): "CK" ------------------------------------------------------------------------------------------------------------------ No results found for: "BNP"  CBG: Recent Labs  Lab 04/10/22 0002 04/10/22 0511  04/10/22 1132 04/10/22 1702 04/11/22 0608  GLUCAP 111* 88 87 97 81     Recent Results (from the past 240 hour(s))  Urine Culture     Status: Abnormal   Collection Time: 04/06/22  8:20 PM   Specimen: Urine, Clean Catch  Result Value Ref Range Status   Specimen Description   Final    URINE, CLEAN CATCH Performed at [facility], [place] [address] [place], [place] [address]    Special Requests   Final    NONE Performed at [facility], [place] [address], [place], [place] [address]    Culture (A)  Final    >=100,000 COLONIES/mL DIPHTHEROIDS(CORYNEBACTERIUM SPECIES) Standardized susceptibility testing for this organism is not  available. Performed at [facility], [place] [address], [place], [place] [address]    Report Status 04/08/2022 FINAL  Final     Radiology Studies: No results found.   Marzetta Board, MD, PhD [facility]  Between 7 am - 7 pm I am available, please contact me via Amion (for emergencies) or Securechat (non urgent messages)  Between 7 pm - 7 am I am not available, please contact night coverage MD/APP via Amion

## 2022-04-11 NOTE — Consult Note (Cosign Needed Addendum)
[facility] Consult   Reason for Consult:Dementia with behavioral problems Referring Physician: Marveen Reeks  Patient Identification: [name] MRN:  [id number] Principal Diagnosis: Hypernatremia Diagnosis:  Principal Problem:   Hypernatremia Active Problems:   Alzheimer's dementia with behavioral disturbance ([place])   Altered mental status   Total Time spent with patient: 15 minutes  Subjective:   [name] is a 76 y.o. male was seen and evaluated face-to-face.  Patient's wife Nyxon Strupp was at bedside. [name] was not engaged during this assessment, he was noted to have frequent outbursts of screaming/yelling.  Psychiatric consult placed due to behavioral issues related to dementia. Per patient's wife reports some improvement related with patient's behavior.  We are "just taken a day by day, but we are more concerned with his contractures" that she has started since he was admitted.    States she has been in contact with multiple doctors related physical therapy and  neurology. Stated that she didn't feel like current living facility that [facility] will be agreeable to take him back in her current condition. Reported she and family have been following up with long term care facilities.  Jailan does not appear to need acute psychiatric inpatient admission at this time. Chart reviewed and consults orders was placed for Palliative and SNF placement.   HPI: "76 yo male with a history of dementia.  He was admitted for hyponatremia, and carries a previous psychiatric diagnosis of depression, advanced dementia."   Past Psychiatric History:  Risk to Self:   Risk to Others:   Prior Inpatient Therapy:   Prior Outpatient Therapy:    Past Medical History:  Past Medical History:  Diagnosis Date   Allergy    Alzheimer disease ([place]) 06/2018   diagnosed with early stage alzheimers   Arthritis    osteoarthritis   Cancer ([place])    testicular, age 13,  orchiectomy 1992   Cataract    B/L CATARACTS NO SURGERY   Dementia ([place])    Depression    Dyslipidemia    Heartburn    TAKES ZANTAC   History of radiation therapy 12/02/18- 01/14/19   Head and neck/ base of tongue. 60 Gy over 30 fractions.    Hypertension    Hypogonadism male    Prostate cancer ([place]) 06/07/11   gleason 3+3=6, vol 59.5 cc   Sleep apnea    Status post chemotherapy    testicular cancer 1992, Dr Beryle Beams    Past Surgical History:  Procedure Laterality Date   APPENDECTOMY     COLONOSCOPY  08/07/2005   gessner   DIRECT LARYNGOSCOPY N/A 08/19/2018   Procedure: DIRECT LARYNGOSCOPY;  Surgeon: Leta Baptist, MD;  Location: [facility];  Service: ENT;  Laterality: N/A;   INGUINAL HERNIA REPAIR     w/orchiectomy 1992, retroperitoneal lymph node dissection   IR GASTROSTOMY TUBE REMOVAL  02/28/2019   MENISECTOMY     R&L knee surgeries   PROSTATE SURGERY     biopsy x 2   SHX1  06/13/2021   right inferior mid medial external ear antehelix shave   shx1 Left 11/21/2021   epidermoid cyst inflamed and disrupted   SKIN BIOPSY Right 01/09/2022   chondrodermatitis nodularis helicis , 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   TONGUE BIOPSY N/A 08/19/2018   Procedure: BIOPSY OF TONGUE BASE MASS;  Surgeon: Leta Baptist, MD;  Location: [facility];  Service: ENT;  Laterality: N/A;   Family History:  Family History  Problem Relation Age  of Onset   Lung cancer Mother    Hypertension Mother    Lung cancer Father    Heart failure Father    Hypertension Father    Family Psychiatric  History:  Social History:  Social History   Substance and Sexual Activity  Alcohol Use Not Currently     Social History   Substance and Sexual Activity  Drug Use No    Social History   Socioeconomic History   Marital status: Married    Spouse name: Not on file   Number of children: 0   Years of education: Not on file   Highest education level: Not on file  Occupational History    Occupation: RETIRED    Employer: [employer]  Tobacco Use   Smoking status: Former    Packs/day: 1.00    Years: 20.00    Total pack years: 20.00    Types: Cigarettes    Quit date: 08/03/1999    Years since quitting: 22.7   Smokeless tobacco: Never  Vaping Use   Vaping Use: Never used  Substance and Sexual Activity   Alcohol use: Not Currently   Drug use: No   Sexual activity: Yes  Other Topics Concern   Not on file  Social History Narrative   Not on file   Social Determinants of Health   Financial Resource Strain: Not on file  Food Insecurity: Not on file  Transportation Needs: No Transportation Needs (09/27/2018)   PRAPARE - Hydrologist (Medical): No    Lack of Transportation (Non-Medical): No  Physical Activity: Not on file  Stress: Not on file  Social Connections: Not on file   Additional Social History:    Allergies:  No Known Allergies  Labs:  Results for orders placed or performed during the hospital encounter of 04/06/22 (from the past 48 hour(s))  Glucose, capillary     Status: None   Collection Time: 04/09/22  5:17 PM  Result Value Ref Range   Glucose-Capillary 99 70 - 99 mg/dL    Comment: Glucose reference range applies only to samples taken after fasting for at least 8 hours.  Glucose, capillary     Status: Abnormal   Collection Time: 04/10/22 12:02 AM  Result Value Ref Range   Glucose-Capillary 111 (H) 70 - 99 mg/dL    Comment: Glucose reference range applies only to samples taken after fasting for at least 8 hours.  CBC     Status: Abnormal   Collection Time: 04/10/22  3:44 AM  Result Value Ref Range   WBC 3.6 (L) 4.0 - 10.5 K/uL   RBC 3.33 (L) 4.22 - 5.81 MIL/uL   Hemoglobin 11.3 (L) 13.0 - 17.0 g/dL   HCT 32.3 (L) 39.0 - 52.0 %   MCV 97.0 80.0 - 100.0 fL   MCH 33.9 26.0 - 34.0 pg   MCHC 35.0 30.0 - 36.0 g/dL   RDW 14.7 11.5 - 15.5 %   Platelets PLATELET CLUMPS NOTED ON SMEAR, UNABLE TO ESTIMATE 150  - 400 K/uL    Comment: Immature Platelet Fraction may be clinically indicated, consider ordering this additional test [id number]    nRBC 0.0 0.0 - 0.2 %    Comment: Performed at [facility], [place] [address] [place], [place] [address]  Magnesium     Status: None   Collection Time: 04/10/22  3:44 AM  Result Value Ref Range   Magnesium 2.2 1.7 - 2.4 mg/dL  Comment: Performed at [facility], [place] [address], [place], [place] [address]  Comprehensive metabolic panel     Status: Abnormal   Collection Time: 04/10/22  5:11 AM  Result Value Ref Range   Sodium 142 135 - 145 mmol/L   Potassium 3.9 3.5 - 5.1 mmol/L   Chloride 113 (H) 98 - 111 mmol/L   CO2 25 22 - 32 mmol/L   Glucose, Bld 95 70 - 99 mg/dL    Comment: Glucose reference range applies only to samples taken after fasting for at least 8 hours.   BUN 17 8 - 23 mg/dL   Creatinine, Ser 0.61 0.61 - 1.24 mg/dL   Calcium 8.4 (L) 8.9 - 10.3 mg/dL   Total Protein 5.1 (L) 6.5 - 8.1 g/dL   Albumin 2.6 (L) 3.5 - 5.0 g/dL   AST 236 (H) 15 - 41 U/L   ALT 190 (H) 0 - 44 U/L   Alkaline Phosphatase 61 38 - 126 U/L   Total Bilirubin 0.7 0.3 - 1.2 mg/dL   GFR, Estimated >60 >60 mL/min    Comment: (NOTE) Calculated using the CKD-EPI Creatinine Equation (2021)    Anion gap 4 (L) 5 - 15    Comment: Performed at [facility], [place] [address] [place], [place] [address]  Glucose, capillary     Status: None   Collection Time: 04/10/22  5:11 AM  Result Value Ref Range   Glucose-Capillary 88 70 - 99 mg/dL    Comment: Glucose reference range applies only to samples taken after fasting for at least 8 hours.  Ammonia     Status: None   Collection Time: 04/10/22  9:57 AM  Result Value Ref Range   Ammonia 14 9 - 35 umol/L    Comment: Performed at [facility], [place] [address], [place], [place] [address]  Valproic acid level     Status: None   Collection Time: 04/10/22  9:57 AM   Result Value Ref Range   Valproic Acid Lvl 51 50.0 - 100.0 ug/mL    Comment: Performed at [facility], [place] [address] [place],  [address]  Glucose, capillary     Status: None   Collection Time: 04/10/22 11:32 AM  Result Value Ref Range   Glucose-Capillary 87 70 - 99 mg/dL    Comment: Glucose reference range applies only to samples taken after fasting for at least 8 hours.  Glucose, capillary     Status: None   Collection Time: 04/10/22  5:02 PM  Result Value Ref Range   Glucose-Capillary 97 70 - 99 mg/dL    Comment: Glucose reference range applies only to samples taken after fasting for at least 8 hours.  Basic metabolic panel     Status: Abnormal   Collection Time: 04/11/22  4:03 AM  Result Value Ref Range   Sodium 144 135 - 145 mmol/L   Potassium 3.7 3.5 - 5.1 mmol/L   Chloride 114 (H) 98 - 111 mmol/L   CO2 24 22 - 32 mmol/L   Glucose, Bld 91 70 - 99 mg/dL    Comment: Glucose reference range applies only to samples taken after fasting for at least 8 hours.   BUN 21 8 - 23 mg/dL   Creatinine, Ser 0.52 (L) 0.61 - 1.24 mg/dL   Calcium 8.3 (L) 8.9 - 10.3 mg/dL   GFR, Estimated >60 >60 mL/min    Comment: (NOTE) Calculated using the CKD-EPI Creatinine Equation (2021)  Anion gap 6 5 - 15    Comment: Performed at [facility], [place] [address], [place], [place] [address]  CBC     Status: Abnormal   Collection Time: 04/11/22  4:03 AM  Result Value Ref Range   WBC 4.2 4.0 - 10.5 K/uL   RBC 3.87 (L) 4.22 - 5.81 MIL/uL   Hemoglobin 12.8 (L) 13.0 - 17.0 g/dL   HCT 37.5 (L) 39.0 - 52.0 %   MCV 96.9 80.0 - 100.0 fL   MCH 33.1 26.0 - 34.0 pg   MCHC 34.1 30.0 - 36.0 g/dL   RDW 14.7 11.5 - 15.5 %   Platelets 146 (L) 150 - 400 K/uL   nRBC 0.0 0.0 - 0.2 %    Comment: Performed at [facility], [place] [address], [place], [place] [address]  Magnesium     Status: None   Collection Time: 04/11/22  4:03 AM  Result Value Ref  Range   Magnesium 2.4 1.7 - 2.4 mg/dL    Comment: Performed at [facility], [place] [address] [place],  [address]  Glucose, capillary     Status: None   Collection Time: 04/11/22  6:08 AM  Result Value Ref Range   Glucose-Capillary 81 70 - 99 mg/dL    Comment: Glucose reference range applies only to samples taken after fasting for at least 8 hours.    Current Facility-Administered Medications  Medication Dose Route Frequency Provider Last Rate Last Admin   acetaminophen (TYLENOL) tablet 650 mg  650 mg Oral Q6H PRN Rise Patience, MD   650 mg at 04/07/22 1942   Or   acetaminophen (TYLENOL) suppository 650 mg  650 mg Rectal Q6H PRN Rise Patience, MD       donepezil (ARICEPT) tablet 10 mg  10 mg Oral QHS Rise Patience, MD   10 mg at 04/10/22 2210   enoxaparin (LOVENOX) injection 30 mg  30 mg Subcutaneous Q24H Rise Patience, MD   30 mg at 04/10/22 2211   ferrous gluconate (FERGON) tablet 324 mg  324 mg Oral Q breakfast Caren Griffins, MD   324 mg at 83/41/96 2229   folic acid injection 1 mg  1 mg Intravenous Daily Caren Griffins, MD   1 mg at 04/11/22 0944   hydrALAZINE (APRESOLINE) injection 5 mg  5 mg Intravenous Q4H PRN Rise Patience, MD   5 mg at 04/06/22 2251   levothyroxine (SYNTHROID, LEVOTHROID) injection 50 mcg  50 mcg Intravenous Daily Caren Griffins, MD   50 mcg at 04/11/22 0943   LORazepam (ATIVAN) tablet 0.5 mg  0.5 mg Oral BID PRN Rise Patience, MD   0.5 mg at 04/09/22 1128   memantine (NAMENDA) tablet 10 mg  10 mg Oral BID Rise Patience, MD   10 mg at 04/11/22 0944   mirtazapine (REMERON SOL-TAB) disintegrating tablet 15 mg  15 mg Oral QHS Rise Patience, MD   15 mg at 04/10/22 2210   OLANZapine zydis (ZYPREXA) disintegrating tablet 2.5 mg  2.5 mg Oral TID PRN Cinderella, Margaret A       Or   OLANZapine (ZYPREXA) injection 2.5 mg  2.5 mg Intramuscular TID PRN Cinderella, Margaret A        OLANZapine (ZYPREXA) tablet 2.5 mg  2.5 mg Oral QHS Cinderella, Margaret A   2.5 mg at 04/10/22 2210   thiamine (VITAMIN B1) 250 mg in sodium chloride 0.9 % 50  mL IVPB  250 mg Intravenous Daily August Albino, NP   Stopped at 04/10/22 1607   Followed by   Derrill Memo ON 04/16/2022] thiamine (VITAMIN B1) injection 100 mg  100 mg Intravenous Daily August Albino, NP        Musculoskeletal: Observed resting in bed  Psychiatric Specialty Exam:  Presentation  General Appearance: Other (comment) Eye Contact:Minimal Speech:Other (comment) (incoherant) Speech Volume:-- (Fluctuating) Handedness:-- (Soft mittent  restraints/ NA)  Mood and Affect  Mood:Irritable Affect:Other (comment)  Thought Process  Thought Processes:Other (comment) Descriptions of Associations:-- (N/A)  Orientation:-- (self and he stated his DOB)  Thought Content:Other (comment)  History of Schizophrenia/Schizoaffective disorder:No data recorded Duration of Psychotic Symptoms:No data recorded Hallucinations:Hallucinations: Other (comment)  Ideas of Reference:None  Suicidal Thoughts:Suicidal Thoughts: -- (n/A)  Homicidal Thoughts:No data recorded  Sensorium  Memory:No data recorded Judgment:No data recorded Insight:Lacking  Executive Functions  Concentration:Poor Attention Span:Poor Recall:Poor Fund of Knowledge:Poor Language:Poor  Psychomotor Activity  Psychomotor Activity:Psychomotor Activity: Other (comment)  Assets  Assets:Other (comment)  Sleep  Sleep:Sleep: Poor  Physical Exam: Physical Exam Vitals and nursing note reviewed.  Psychiatric:        Mood and Affect: Mood normal.    Review of Systems  All other systems reviewed and are negative.  Blood pressure (!) 110/90, pulse (!) 110, temperature 98.3 F (36.8 C), temperature source Oral, resp. rate 16, height '5\' 6"'$  (1.676 m), weight 41.5 kg, SpO2 (!) 80 %. Body mass index is 14.77 kg/m.  Treatment Plan Summary: Daily contact with  patient to assess and evaluate symptoms and progress in treatment and Medication management  -Please reconsult as needed  Alzheimer's disease: Increased agitation:  Continue Aricept 10 mg daily and Namenda 10 mg daily Continue as needed Ativan 0.5 mg for increased agitation Orders placed for Zyprexa 2.5 mg 3 times daily as needed Continue Remeron 7.5 mg to 15 mg p.o. nightly  Chart reviewed and consults orders was placed for Palliative and SNF placement.   Disposition: Patient does not meet criteria for psychiatric inpatient admission. Supportive therapy provided about ongoing stressors. Discussed crisis plan, support from social network, calling 911, coming to the Emergency Department, and calling Suicide Hotline.  Derrill [place], NP 04/11/2022 2:05 PM

## 2022-04-11 NOTE — TOC Progression Note (Signed)
Transition of Care [facility]) - Progression Note    Patient Details  Name: ALFORD GAMERO MRN: [id number] Date of Birth: 1946/05/02  [facility] CM/SW Contact  Ross Ludwig, [place] Phone Number: 04/11/2022, 12:26 PM  Clinical Narrative:     Patient has been assigned QMHP for review in [facility].  TOC to continue to follow patient's progress throughout discharge planning.  Expected Discharge Plan: [place] Barriers to Discharge: Continued Medical Work up  Expected Discharge Plan and Services Expected Discharge Plan: [place] In-house Referral: Clinical Social Work   Post Acute Care Choice: [place] Living arrangements for the past 2 months: [place]                 DME Arranged: N/A DME Agency: NA                   Social Determinants of Health (SDOH) Interventions    Readmission Risk Interventions     No data to display

## 2022-04-11 NOTE — Progress Notes (Signed)
Palliative Care Progress Note, Assessment & Plan   Patient Name: [name]       Date: 04/11/2022 DOB: [birth year]  Age: 76 y.o. MRN#: [id number] Attending Physician: Caren Griffins, MD Primary Care Physician: Virgie Dad, MD Admit Date: 04/06/2022  Reason for Consultation/Follow-up: Establishing goals of care  Subjective: Patient is lying in bed in no apparent distress.  He acknowledges my presence but is not able to make his wishes known.  He does not hold my gaze and frequently looks in different directions when he hears voices.  Patient appears to continue to be hallucinating and wife at bedside agrees.  HPI: Patient is a 76 year old male with a past medical history of Alzheimer's/dementia that was admitted to the emergency department on 8/31 due to increasing agitation and poor p.o. intake.  Patient was recently hospitalized at [facility] (approximately 2 weeks ago) where patient's medications were adjusted and he was discharged to SNF.  Over the course of the last month at SNF, patient had progressive agitation and over the last few days PTA had decreased p.o. intake.  On admission, patient was hypernatremic.  Course of hospitalization has been complicated by persistent agitation and hallucinations.  Psychiatry and palliative medicine were consulted.  PMT was consulted to discuss goals of care.  Summary of counseling/coordination of care: After reviewing the patient's chart and assessing the patient at bedside, I spoke with patient's wife at bedside.  She shares she thinks he has had some improvement in the last 24 hours.  She endorses he ate about half of his eggs and portions of the other parts of his breakfast.  She is hopeful that physical therapy will be able to "do some magic" and be able to  help him mobilize more.    We discussed importance of functional, nutritional, and cognitive status is improving in order for patient to be able to qualify for SNF/Geriatric unit.  Reviewed that we are hoping for the best but if patient continues to have poor p.o. intake, hallucinations unresolved with medication, and remains almost completely immobile in bed that patient would likely be a candidate for residential hospice.  Therapeutic silence and active listening provided for her to share her thoughts and emotions regarding her husband's current medical situation.  She has a clear understanding that continued watchful waiting will help guide next steps for the patient.  Discussed that IV fluids have been stopped and that his p.o. intake independent of artificial intervention will also help determine next steps.  She remains hopeful that patient will improve and be able to go to [facility] inpatient unit.  Discussed that goal is to focus on patient's quality of life.  She says she is taking it one day at a time.  Watchful waiting continues.   Code Status: Limited code  Prognosis: Unable to determine  Discharge Planning: To Be Determined  Physical Exam Constitutional:      Comments: Cachectic  Eyes:     Pupils: Pupils are equal, round, and reactive to light.  Cardiovascular:     Rate and Rhythm: Normal rate.     Pulses: Normal pulses.  Pulmonary:     Effort: Pulmonary effort is normal.  Abdominal:  Palpations: Abdomen is soft.  Musculoskeletal:     Comments: Generalized weakness, bilateral LE contractures - pt favors leaning to left side  Skin:    General: Skin is warm and dry.  Neurological:     Mental Status: He is alert.     Comments: Non-linear verbalizations, unable to answer yes/no or orientation questions             Palliative Assessment/Data: 30%    Total Time 25 minutes  Greater than 50%  of this time was spent counseling and coordinating care related to  the above assessment and plan.  Thank you for allowing the Palliative Medicine Team to assist in the care of this patient.  [place] Ilsa Iha, FNP-BC Palliative Medicine Team Team Phone # [phone]

## 2022-04-12 DIAGNOSIS — R4182 Altered mental status, unspecified: Secondary | ICD-10-CM | POA: Diagnosis not present

## 2022-04-12 DIAGNOSIS — R7989 Other specified abnormal findings of blood chemistry: Secondary | ICD-10-CM

## 2022-04-12 DIAGNOSIS — E43 Unspecified severe protein-calorie malnutrition: Secondary | ICD-10-CM

## 2022-04-12 DIAGNOSIS — G309 Alzheimer's disease, unspecified: Secondary | ICD-10-CM | POA: Diagnosis not present

## 2022-04-12 DIAGNOSIS — E870 Hyperosmolality and hypernatremia: Secondary | ICD-10-CM | POA: Diagnosis not present

## 2022-04-12 DIAGNOSIS — D509 Iron deficiency anemia, unspecified: Secondary | ICD-10-CM

## 2022-04-12 DIAGNOSIS — E538 Deficiency of other specified B group vitamins: Secondary | ICD-10-CM

## 2022-04-12 DIAGNOSIS — F323 Major depressive disorder, single episode, severe with psychotic features: Secondary | ICD-10-CM | POA: Diagnosis not present

## 2022-04-12 LAB — BASIC METABOLIC PANEL
Anion gap: 6 (ref 5–15)
BUN: 26 mg/dL — ABNORMAL HIGH (ref 8–23)
CO2: 24 mmol/L (ref 22–32)
Calcium: 8.4 mg/dL — ABNORMAL LOW (ref 8.9–10.3)
Chloride: 117 mmol/L — ABNORMAL HIGH (ref 98–111)
Creatinine, Ser: 0.61 mg/dL (ref 0.61–1.24)
GFR, Estimated: >60 mL/min (ref >60)
Glucose, Bld: 89 mg/dL (ref 70–99)
Potassium: 4 mmol/L (ref 3.5–5.1)
Sodium: 147 mmol/L — ABNORMAL HIGH (ref 135–145)

## 2022-04-12 LAB — GLUCOSE, CAPILLARY
Glucose-Capillary: 118 mg/dL — ABNORMAL HIGH (ref 70–99)
Glucose-Capillary: 42 mg/dL — CL (ref 70–99)
Glucose-Capillary: 51 mg/dL — ABNORMAL LOW (ref 70–99)
Glucose-Capillary: 85 mg/dL (ref 70–99)
Glucose-Capillary: 91 mg/dL (ref 70–99)
Glucose-Capillary: 98 mg/dL (ref 70–99)

## 2022-04-12 LAB — MAGNESIUM: Magnesium: 2.5 mg/dL — ABNORMAL HIGH (ref 1.7–2.4)

## 2022-04-12 LAB — VITAMIN B1: Vitamin B1 (Thiamine): 98.3 nmol/L (ref 66.5–200.0)

## 2022-04-12 NOTE — Progress Notes (Addendum)
Patients urine output is low. 300 ml overnight and not enough to record for the day. Dr. Maylene Roes is aware.  Patient had output of 200 ml.

## 2022-04-12 NOTE — Progress Notes (Signed)
PROGRESS NOTE    [name]  BPZ:[id number] DOB: 08-06-1946 DOA: 04/06/2022 PCP: Virgie Dad, MD     Brief Narrative:  [name] is a 76 y.o. male with known history of Alzheimer's dementia was brought to the ER after patient became increasingly agitated with poor oral intake.  Patient was brought to the ER about 2 weeks ago at [facility] at that time patient's medications were adjusted and patient was discharged back to skilled nursing facility.  Patient has been living at the skilled nursing facility for over a month now.  Patient has been progressively getting more agitated.  Over the last few days has not eaten well.  He was found to have hypernatremia and admitted to the hospital.  Hospital course complicated by persistent agitation.   New events last 24 hours / Subjective: Wife is at bedside.  Patient hallucinating, required IV Ativan this morning.  States that he was able to eat a good breakfast yesterday but has not really eaten much since.  Sodium levels trending upward again.  Had hypoglycemic episode down to 42.  Discussed with wife that in setting of advanced dementia with behavioral disturbance, poor oral intake and recurrent hypernatremia, prognosis is likely poor.  I recommended residential hospice with prognosis less than 2 weeks.  Wife asked about difference between long-term care versus hospice, was overall overwhelmed during our conversation today.  Assessment & Plan:   Principal Problem:   Hypernatremia Active Problems:   Depression, psychotic ([place])   Alzheimer's dementia with behavioral disturbance (HCC)   Hypothyroidism   Altered mental status   LFT elevation   Iron deficiency anemia   Folate deficiency   Protein-calorie malnutrition, severe (HCC)   Hypernatremia in setting of poor oral intake -IV fluid has been discontinued to allow p.o. intake, sodium level is trending upward.  Recommend residential hospice.  Palliative care  consulted.  Advanced dementia with behavioral disturbance -Has been declining over the last 6 months.  He has not been ambulatory for the past 3 to 4 weeks. -Neurology as well as psychiatry has been consulted  -Aricept, Namenda, Ativan, mirtazapine, Zyprexa  Elevated LFTs -Right upper quadrant ultrasound negative -His antipsychotic medications can cause this -Depakote was discontinued per psychiatry  Hypothyroidism -IV Synthroid  Iron deficiency -Continue iron supplementation  Folate deficiency -Continue folate supplementation  Concern for NPH -Previous hospitalist discussed with neurology, can address in the future if patient has clinical improvement  Severe protein calorie malnutrition -BMI 14.77   DVT prophylaxis:  enoxaparin (LOVENOX) injection 30 mg Start: 04/06/22 2200  Code Status: Partial code, DO NOT INTUBATE, no CPR Family Communication: Wife at bedside Disposition Plan:  Status is: Inpatient Remains inpatient appropriate because: Poor oral intake.  Recommend residential hospice and comfort care  Antimicrobials:  Anti-infectives (From admission, onward)    None        Objective: Vitals:   04/11/22 2036 04/11/22 2300 04/12/22 0632 04/12/22 1135  BP: 130/84  124/62 116/68  Pulse: 90  77 77  Resp: '16  18 17  '$ Temp: (!) 97.5 F (36.4 C)  97.8 F (36.6 C)   TempSrc: Oral  Oral   SpO2: (!) 88% 95% 93% 100%  Weight:      Height:        Intake/Output Summary (Last 24 hours) at 04/12/2022 1240 Last data filed at 04/12/2022 1000 Gross per 24 hour  Intake 300 ml  Output 550 ml  Net -250 ml   [employer]  04/06/22 1621  Weight: 41.5 kg    Examination:  General exam: Appears calm, not lucid, hallucinates and shouts out during examination Respiratory system: Clear to auscultation. Respiratory effort normal. Cardiovascular system: S1 & S2 heard, RRR. No murmurs. No pedal edema. Gastrointestinal system: Abdomen is nondistended, soft  Central  nervous system: Alert  Extremities: Symmetric in appearance  Skin: No rashes, lesions or ulcers on exposed skin  Data Reviewed: I have personally reviewed following labs and imaging studies  CBC: Recent Labs  Lab 04/06/22 1730 04/07/22 0229 04/09/22 0500 04/10/22 0344 04/11/22 0403  WBC 3.0* 3.4* 3.8* 3.6* 4.2  NEUTROABS 1.5* 2.1  --   --   --   HGB 14.3 13.5 10.5* 11.3* 12.8*  HCT 46.7 41.6 32.0* 32.3* 37.5*  MCV 108.4* 102.2* 99.7 97.0 96.9  PLT 201 208 130* PLATELET CLUMPS NOTED ON SMEAR, UNABLE TO ESTIMATE 623*   Basic Metabolic Panel: Recent Labs  Lab 04/08/22 1615 04/09/22 0500 04/10/22 0344 04/10/22 0511 04/11/22 0403 04/12/22 0450  NA 150* 140  --  142 144 147*  K 3.7 2.9*  --  3.9 3.7 4.0  CL 119* 110  --  113* 114* 117*  CO2 25 24  --  '25 24 24  '$ GLUCOSE 123* 135*  --  95 91 89  BUN 30* 29*  --  17 21 26*  CREATININE 0.93 0.68  --  0.61 0.52* 0.61  CALCIUM 8.8* 7.9*  --  8.4* 8.3* 8.4*  MG  --  2.1 2.2  --  2.4 2.5*   GFR: Estimated Creatinine Clearance: 48.3 mL/min (by C-G formula based on SCr of 0.61 mg/dL). Liver Function Tests: Recent Labs  Lab 04/06/22 1730 04/10/22 0511  AST 40 236*  ALT 39 190*  ALKPHOS 79 61  BILITOT 0.9 0.7  PROT 6.2* 5.1*  ALBUMIN 3.4* 2.6*   No results for input(s): "LIPASE", "AMYLASE" in the last 168 hours. Recent Labs  Lab 04/06/22 1903 04/10/22 0957  AMMONIA 33 14   Coagulation Profile: No results for input(s): "INR", "PROTIME" in the last 168 hours. Cardiac Enzymes: Recent Labs  Lab 04/06/22 1730 04/08/22 1205  CKTOTAL 155 305   BNP (last 3 results) No results for input(s): "PROBNP" in the last 8760 hours. HbA1C: No results for input(s): "HGBA1C" in the last 72 hours. CBG: Recent Labs  Lab 04/12/22 0008 04/12/22 0646 04/12/22 0715 04/12/22 0757 04/12/22 1137  GLUCAP 91 51* 42* 85 98   Lipid Profile: No results for input(s): "CHOL", "HDL", "LDLCALC", "TRIG", "CHOLHDL", "LDLDIRECT" in the last  72 hours. Thyroid Function Tests: No results for input(s): "TSH", "T4TOTAL", "FREET4", "T3FREE", "THYROIDAB" in the last 72 hours. Anemia Panel: No results for input(s): "VITAMINB12", "FOLATE", "FERRITIN", "TIBC", "IRON", "RETICCTPCT" in the last 72 hours. Sepsis Labs: No results for input(s): "PROCALCITON", "LATICACIDVEN" in the last 168 hours.  Recent Results (from the past 240 hour(s))  Urine Culture     Status: Abnormal   Collection Time: 04/06/22  8:20 PM   Specimen: Urine, Clean Catch  Result Value Ref Range Status   Specimen Description   Final    URINE, CLEAN CATCH Performed at [facility], [place], [place] [address] [place], [place] [address]    Special Requests   Final    NONE Performed at [facility], [place] [address] [place], [place] [address]    Culture (A)  Final    >=100,000 COLONIES/mL DIPHTHEROIDS(CORYNEBACTERIUM SPECIES) Standardized susceptibility testing for this organism is not available. Performed  at [facility], [place] [address], [place], [place] [address]    Report Status 04/08/2022 FINAL  Final      Radiology Studies: No results found.    Scheduled Meds:  donepezil  10 mg Oral QHS   enoxaparin (LOVENOX) injection  30 mg Subcutaneous Q24H   ferrous gluconate  324 mg Oral Q breakfast   folic acid  1 mg Intravenous Daily   levothyroxine  50 mcg Intravenous Daily   memantine  10 mg Oral BID   mirtazapine  15 mg Oral QHS   OLANZapine  2.5 mg Oral QHS   [START ON 04/16/2022] thiamine (VITAMIN B1) injection  100 mg Intravenous Daily   Continuous Infusions:  thiamine (VITAMIN B1) injection 250 mg (04/12/22 0931)     LOS: 6 days     Dessa Phi, DO [facility] 04/12/2022, 12:40 PM   Available via Epic secure chat 7am-7pm After these hours, please refer to coverage provider listed on [email or web address]

## 2022-04-12 NOTE — Progress Notes (Signed)
OT Cancellation Note  Patient Details Name: BERL BONFANTI MRN: [id number] DOB: 12/26/1945   Cancelled Treatment:   OT eval order received and chart review completed. Per pt's nurse, the pt has been presenting with agitation this a.m. Will hold evaluation attempt for now & follow-up later, as schedule permits, & as appropriate.    Leota Sauers 04/12/2022, 9:06 AM

## 2022-04-12 NOTE — Progress Notes (Signed)
Pt's blood sugar is 51 this AM, pt is asymptomatic,I ce cream given. Recheck showed 42. Orange juice and coke given. Will recheck.  Notified MD.

## 2022-04-12 NOTE — Progress Notes (Signed)
Palliative Care Progress Note, Assessment & Plan   Patient Name: [name]       Date: 04/12/2022 DOB: 12-Oct-1945  Age: 76 y.o. MRN#: [id number] Attending Physician: Dessa Phi, DO Primary Care Physician: Virgie Dad, MD Admit Date: 04/06/2022  Reason for Consultation/Follow-up: Establishing goals of care  Subjective: Patient is lying in bed in no apparent distress.  He does not acknowledge my presence.  He does not able to make his wishes known.  His legs remain contracted.  He appears to be looking at the sky and does not meet my gaze when I call his name.  He moans mildly during our interaction but has no other signs of distress or pain at this time.  HPI: Patient is a 76 year old male with a past medical history of Alzheimer's/dementia that was admitted to the emergency department on 8/31 due to increasing agitation and poor p.o. intake. Patient was recently hospitalized at [facility] (approximately 2 weeks ago) where patient's medications were adjusted and he was discharged to SNF.  Over the course of the last month at SNF, patient had progressive agitation and over the last few days PTA had decreased p.o. intake.   On admission, patient was hypernatremic.  Course of hospitalization has been complicated by poor po intake, persistent agitation and hallucinations.   Psychiatry and palliative medicine were consulted.  PMT was consulted to discuss goals of care.  Summary of counseling/coordination of care: After reviewing the patient's chart and assessing the patient at bedside, I spoke with patient's wife outside of the room in regards to goals of care.  Wife had appropriate questions regarding long-term care and hospice.  Hospice philosophy, comfort measures, hospice inpatient unit, hospice at home,  and long-term care with hospice discussed in detail.  Therapeutic silence and active listening provided for wife to share thoughts and emotions regarding patient's current medical situation.  Wife was tearful.  She shares that she would like to be able to take care of him but just knows she cannot physically do it at this time.  She expresses feelings of guilt but knows that he needs to have around-the-clock care that she is not able to provide at this time.   I attempted to elicit goals and values important to both patient and wife.  She shares she wants to make sure he is safe and comfortable no matter where he goes.  She also shares she is leaning towards evaluation for hospice inpatient unit. However, she would like more time to think about this decision.  We agreed to speak first thing tomorrow morning in regards to her wishes for care after discharge.   Attending made aware of above discussion.  Discussed with bedside RN and use of as needed medications for agitation and anxiety.   PMT will continue to follow the patient throughout his hospitalization.  Code Status: Limited code  Prognosis: < 2 weeks  Discharge Planning: Hospice facility  Physical Exam Vitals reviewed.  Constitutional:      General: He is not in acute distress.    Appearance: He is not ill-appearing.  HENT:     Head: Normocephalic.     Mouth/Throat:     Mouth: Mucous membranes  are moist.  Cardiovascular:     Rate and Rhythm: Normal rate.     Pulses: Normal pulses.  Pulmonary:     Effort: Pulmonary effort is normal.  Musculoskeletal:     Comments: Generalized weakness, bialteral LE contractures  Skin:    General: Skin is warm and dry.  Neurological:     Comments: Nonverbal, unable to participate in discussions             Palliative Assessment/Data: 30%    Total Time 50 minutes  Greater than 50%  of this time was spent counseling and coordinating care related to the above assessment and  plan.  Thank you for allowing the Palliative Medicine Team to assist in the care of this patient.  [place] Ilsa Iha, FNP-BC Palliative Medicine Team Team Phone # [phone]

## 2022-04-13 DIAGNOSIS — G309 Alzheimer's disease, unspecified: Secondary | ICD-10-CM | POA: Diagnosis not present

## 2022-04-13 DIAGNOSIS — R4182 Altered mental status, unspecified: Secondary | ICD-10-CM | POA: Diagnosis not present

## 2022-04-13 DIAGNOSIS — E870 Hyperosmolality and hypernatremia: Secondary | ICD-10-CM | POA: Diagnosis not present

## 2022-04-13 DIAGNOSIS — Z515 Encounter for palliative care: Secondary | ICD-10-CM

## 2022-04-13 DIAGNOSIS — Z66 Do not resuscitate: Secondary | ICD-10-CM

## 2022-04-13 LAB — BASIC METABOLIC PANEL
Anion gap: 6 (ref 5–15)
BUN: 26 mg/dL — ABNORMAL HIGH (ref 8–23)
CO2: 26 mmol/L (ref 22–32)
Calcium: 8.4 mg/dL — ABNORMAL LOW (ref 8.9–10.3)
Chloride: 116 mmol/L — ABNORMAL HIGH (ref 98–111)
Creatinine, Ser: 0.61 mg/dL (ref 0.61–1.24)
GFR, Estimated: >60 mL/min (ref >60)
Glucose, Bld: 96 mg/dL (ref 70–99)
Potassium: 3.7 mmol/L (ref 3.5–5.1)
Sodium: 148 mmol/L — ABNORMAL HIGH (ref 135–145)

## 2022-04-13 LAB — GLUCOSE, CAPILLARY
Glucose-Capillary: 103 mg/dL — ABNORMAL HIGH (ref 70–99)
Glucose-Capillary: 116 mg/dL — ABNORMAL HIGH (ref 70–99)
Glucose-Capillary: 124 mg/dL — ABNORMAL HIGH (ref 70–99)
Glucose-Capillary: 90 mg/dL (ref 70–99)

## 2022-04-13 MED ORDER — LORAZEPAM 2 MG/ML IJ SOLN
0.5000 mg | INTRAMUSCULAR | Status: DC | PRN
  Administered 2022-04-13 – 2022-04-14 (×7): 0.5 mg via INTRAVENOUS
  Filled 2022-04-13 (×7): qty 1

## 2022-04-13 MED ORDER — MORPHINE SULFATE (PF) 2 MG/ML IV SOLN: 1.0000 mg | INTRAVENOUS | Status: DC | PRN

## 2022-04-13 NOTE — Progress Notes (Signed)
[facility]) Hospice hospital liaison note  Received request from [facility], Hulan Amato for family interest in [facility].  Chart reviewed and eligibility is pending at this time.   Talked with wife by phone and bedside meeting planned for Friday morning.   Unfortunately [facility] does not have a bed to offer today.   TOC is aware hospital liaison will follow up tomorrow or sooner if room becomes available.   Please do not hesitate to call with any hospice related questions or concerns.   Thank you for the opportunity to participate in this patient's care.  Jhonnie Garner, Therapist, sports, [facility] & Hospital Liaison  [phone]

## 2022-04-13 NOTE — Progress Notes (Signed)
PROGRESS NOTE    [name]  OFB:[id number] DOB: 1945/12/23 DOA: 04/06/2022 PCP: Virgie Dad, MD     Brief Narrative:  [name] is a 76 y.o. male with known history of Alzheimer's dementia was brought to the ER after patient became increasingly agitated with poor oral intake.  Patient was brought to the ER about 2 weeks ago at [facility] at that time patient's medications were adjusted and patient was discharged back to skilled nursing facility.  Patient has been living at the skilled nursing facility for over a month now.  Patient has been progressively getting more agitated.  Over the last few days has not eaten well.  He was found to have hypernatremia and admitted to the hospital.  Hospital course complicated by persistent agitation, poor oral intake.  IV fluid was discontinued and patient did not take enough p.o. to maintain adequate nutrition and hydration.  Urine output diminished.  Patient's wife met with palliative care medicine.  Ultimately due to lack of improvement and stabilization, continued decline, advanced dementia with hallucinations, wife elected to transition patient to comfort care and look into residential hospice.  New events last 24 hours / Subjective: Wife is at bedside.  Patient with continued decline.  Shouting out, with intermittent agitation/confusion.  He has not had much p.o. intake.  Urine output has decreased.  Discussed with patient's wife outside of patient's room.  Wife decided to transition patient to comfort care and focus on symptoms only.  TOC consulted for residential hospice.  Assessment & Plan:   Principal Problem:   Hypernatremia Active Problems:   Depression, psychotic (HCC)   Alzheimer's dementia with behavioral disturbance (HCC)   Hypothyroidism   Altered mental status   LFT elevation   Iron deficiency anemia   Folate deficiency   Protein-calorie malnutrition, severe ([place])   DNR (do not resuscitate)    Comfort measures only status   End of life care   Comfort care only.  TOC consulted for residential hospice.  Suspect prognosis less than 2 weeks.   Antimicrobials:  Anti-infectives (From admission, onward)    None        Objective: Vitals:   04/12/22 0632 04/12/22 1135 04/12/22 2140 04/13/22 0646  BP: 124/62 116/68 (!) 151/84 95/77  Pulse: 77 77 90 96  Resp: _0 Temp: 97.8 F (36.6 C)  98.4 F (36.9 C) 97.8 F (36.6 C)  TempSrc: Oral   Oral  SpO2: 93% 100% 95% 100%  Weight:      Height:        Intake/Output Summary (Last 24 hours) at 04/13/2022 1005 Last data filed at 04/13/2022 0152 Gross per 24 hour  Intake 63.06 ml  Output 550 ml  Net -486.94 ml   Filed Weights   04/06/22 1621  Weight: 41.5 kg    Examination:  General exam: Appears calm, not lucid, shouts out during examination.  Appears more fatigued today, cachectic Respiratory system: Clear to auscultation. Respiratory effort normal. Cardiovascular system: S1 & S2 heard, RRR. No murmurs. No pedal edema. Gastrointestinal system: Abdomen is nondistended, soft  Extremities: Symmetric in appearance    Data Reviewed: I have personally reviewed following labs and imaging studies  CBC: Recent Labs  Lab 04/06/22 1730 04/07/22 0229 04/09/22 0500 04/10/22 0344 04/11/22 0403  WBC 3.0* 3.4* 3.8* 3.6* 4.2  NEUTROABS 1.5* 2.1  --   --   --   HGB 14.3 13.5 10.5* 11.3* 12.8*  HCT 46.7  41.6 32.0* 32.3* 37.5*  MCV 108.4* 102.2* 99.7 97.0 96.9  PLT 201 208 130* PLATELET CLUMPS NOTED ON SMEAR, UNABLE TO ESTIMATE 403*   Basic Metabolic Panel: Recent Labs  Lab 04/09/22 0500 04/10/22 0344 04/10/22 0511 04/11/22 0403 04/12/22 0450 04/13/22 0505  NA 140  --  142 144 147* 148*  K 2.9*  --  3.9 3.7 4.0 3.7  CL 110  --  113* 114* 117* 116*  CO2 24  --  _0 GLUCOSE 135*  --  95 91 89 96  BUN 29*  --  17 21 26* 26*  CREATININE 0.68  --  0.61 0.52* 0.61 0.61  CALCIUM 7.9*  --  8.4* 8.3* 8.4*  8.4*  MG 2.1 2.2  --  2.4 2.5*  --    GFR: Estimated Creatinine Clearance: 48.3 mL/min (by C-G formula based on SCr of 0.61 mg/dL). Liver Function Tests: Recent Labs  Lab 04/06/22 1730 04/10/22 0511  AST 40 236*  ALT 39 190*  ALKPHOS 79 61  BILITOT 0.9 0.7  PROT 6.2* 5.1*  ALBUMIN 3.4* 2.6*   No results for input(s): "LIPASE", "AMYLASE" in the last 168 hours. Recent Labs  Lab 04/06/22 1903 04/10/22 0957  AMMONIA 33 14   Coagulation Profile: No results for input(s): "INR", "PROTIME" in the last 168 hours. Cardiac Enzymes: Recent Labs  Lab 04/06/22 1730 04/08/22 1205  CKTOTAL 155 305   BNP (last 3 results) No results for input(s): "PROBNP" in the last 8760 hours. HbA1C: No results for input(s): "HGBA1C" in the last 72 hours. CBG: Recent Labs  Lab 04/12/22 0757 04/12/22 1137 04/12/22 1658 04/13/22 0048 04/13/22 0639  GLUCAP 85 98 118* 124* 103*   Lipid Profile: No results for input(s): "CHOL", "HDL", "LDLCALC", "TRIG", "CHOLHDL", "LDLDIRECT" in the last 72 hours. Thyroid Function Tests: No results for input(s): "TSH", "T4TOTAL", "FREET4", "T3FREE", "THYROIDAB" in the last 72 hours. Anemia Panel: No results for input(s): "VITAMINB12", "FOLATE", "FERRITIN", "TIBC", "IRON", "RETICCTPCT" in the last 72 hours. Sepsis Labs: No results for input(s): "PROCALCITON", "LATICACIDVEN" in the last 168 hours.  Recent Results (from the past 240 hour(s))  Urine Culture     Status: Abnormal   Collection Time: 04/06/22  8:20 PM   Specimen: Urine, Clean Catch  Result Value Ref Range Status   Specimen Description   Final    URINE, CLEAN CATCH Performed at [facility], [place] [address], [place], [place] [address]    Special Requests   Final    NONE Performed at [facility], [place] [address], [place], [place] [address]    Culture (A)  Final    >=100,000 COLONIES/mL DIPHTHEROIDS(CORYNEBACTERIUM SPECIES) Standardized susceptibility  testing for this organism is not available. Performed at [facility], [place] [address], [place], [place] [address]    Report Status 04/08/2022 FINAL  Final      Radiology Studies: No results found.    Scheduled Meds:  OLANZapine  2.5 mg Oral QHS   Continuous Infusions:     LOS: 7 days     Dessa Phi, DO [facility] 04/13/2022, 10:05 AM   Available via Epic secure chat 7am-7pm After these hours, please refer to coverage provider listed on [email or web address]

## 2022-04-13 NOTE — Progress Notes (Signed)
OT Cancellation Note  Patient Details Name: [name] MRN: [id number] DOB: [birth year]   OT eval order received and chart review completed. Per the pt's updated medical record, the pt's spouse has decided to transition the pt to comfort/hospice services. As such, OT will sign off. Thank you.    Leota Sauers, OTR/L 04/13/2022, 3:00 PM

## 2022-04-13 NOTE — Care Management Important Message (Signed)
Important Message  Patient Details IM Letter placed in Patients room. Name: [name] MRN: [id number] Date of Birth: Jul 18, 1946   Medicare Important Message Given:  Yes     Kerin Salen 04/13/2022, 3:01 PM

## 2022-04-13 NOTE — Progress Notes (Signed)
Palliative Care Progress Note, Assessment & Plan   Patient Name: [name]       Date: 04/13/2022 DOB: [birth year]  Age: 76 y.o. MRN#: [id number] Attending Physician: Dessa Phi, DO Primary Care Physician: Virgie Dad, MD Admit Date: 04/06/2022  Reason for Consultation/Follow-up: Establishing goals of care  Subjective: Patient is lying in bed in no apparent distress.  He yelps and moans without signs of distress, anxiety, or increased work of breathing.  Wife is at bedside.  HPI: Patient is a 76 year old male with a past medical history of Alzheimer's/dementia that was admitted to the emergency department on 8/31 due to increasing agitation and poor p.o. intake. Patient was recently hospitalized at [facility] (approximately 2 weeks ago) where patient's medications were adjusted and he was discharged to SNF.  Over the course of the last month at SNF, patient had progressive agitation and over the last few days PTA had decreased p.o. intake.   On admission, patient was hypernatremic.  Course of hospitalization has been complicated by poor po intake, persistent agitation and hallucinations.   Psychiatry and palliative medicine were consulted.  PMT was consulted to discuss goals of care.  Summary of counseling/coordination of care: After reviewing the patient's chart and assessing the patient at bedside, spoke with patient's wife [name] in the hallway.  [name] is concerned that patient will not be able to have a smooth transition from hospital to inpatient unit.  Therapeutic silence and active listening provided for [name] to share her thoughts and emotions regarding her husband's current medical situation.  Discussed that hospice has yet to evaluate patient.  Reviewed we will have hospice determine  patient's eligibility and evaluate his readiness to be transferred once eligibility is determined.  [name] was in agreement with awaiting hospice's recommendations.  [name] asked appropriate questions regarding medication regimen and if it would change upon transfer.  Reviewed that patient's agitation and hallucinations will be as minimized as medically possible both here during hospitalization and when transferred to residential hospice facility.  TOC made aware that patient's wife is ready to move forward with residential hospice evaluation.   PMT will continue to follow the patient throughout his hospitalization.  Code Status: DNR  Prognosis: < 2 weeks  Discharge Planning: Hospice facility  Physical Exam Vitals reviewed.  Constitutional:      General: He is not in acute distress.    Appearance: Normal appearance. He is not ill-appearing.  HENT:     Mouth/Throat:     Mouth: Mucous membranes are moist.  Cardiovascular:     Rate and Rhythm: Normal rate.     Pulses: Normal pulses.  Pulmonary:     Effort: Pulmonary effort is normal.  Musculoskeletal:     Comments: Bilateral LE contractures  Skin:    General: Skin is warm and dry.  Neurological:     Comments: Moans and groans             Palliative Assessment/Data: 20%    Total Time 35 minutes  Greater than 50%  of this time was spent counseling and coordinating care related to the above assessment and plan.  Thank you for allowing the Palliative Medicine Team to assist in the care  of this patient.  [place] Ilsa Iha, FNP-BC Palliative Medicine Team Team Phone # [phone]

## 2022-04-13 NOTE — TOC Progression Note (Signed)
Transition of Care [facility]) - Progression Note    Patient Details  Name: [name] MRN: [id number] Date of Birth: 03-Feb-1946  Transition of Care [facility]) CM/SW [place], RN Phone Number: 04/13/2022, 2:41 PM  Clinical Narrative:   Spoke with patient's wife [name] at bedside to offer residential hospice choice. Family chose [facility]. Referral made to [facility] with [facility], will await outcome.  TOC will continue to follow.    Expected Discharge Plan: [place] Barriers to Discharge: Continued Medical Work up  Expected Discharge Plan and Services Expected Discharge Plan: [place] In-house Referral: NA Discharge Planning Services: CM Consult Post Acute Care Choice: [place] Living arrangements for the past 2 months: [place]                 DME Arranged: N/A DME Agency: NA       HH Arranged: NA HH Agency: NA         Social Determinants of Health (SDOH) Interventions    Readmission Risk Interventions     No data to display

## 2022-04-14 DIAGNOSIS — Z515 Encounter for palliative care: Secondary | ICD-10-CM | POA: Diagnosis not present

## 2022-04-14 DIAGNOSIS — Z66 Do not resuscitate: Secondary | ICD-10-CM | POA: Diagnosis not present

## 2022-04-14 DIAGNOSIS — E870 Hyperosmolality and hypernatremia: Secondary | ICD-10-CM | POA: Diagnosis not present

## 2022-04-14 DIAGNOSIS — G309 Alzheimer's disease, unspecified: Secondary | ICD-10-CM | POA: Diagnosis not present

## 2022-04-14 LAB — VITAMIN B6: Vitamin B6: 3.5 ug/L (ref 3.4–65.2)

## 2022-04-14 NOTE — Plan of Care (Signed)

## 2022-04-14 NOTE — Discharge Summary (Signed)
Physician Discharge Summary  [name] QQP:[id number] DOB: June 13, 1946 DOA: 04/06/2022  PCP: Virgie Dad, MD  Admit date: 04/06/2022 Discharge date: 04/14/2022  Admitted From: SNF Disposition:  Residential hospice   Brief/Interim Summary: [name] is a 76 y.o. male with known history of Alzheimer's dementia was brought to the ER after patient became increasingly agitated with poor oral intake.  Patient was brought to the ER about 2 weeks ago at [facility] at that time patient's medications were adjusted and patient was discharged back to skilled nursing facility.  Patient has been living at the skilled nursing facility for over a month now.  Patient has been progressively getting more agitated.  Over the last few days has not eaten well.  He was found to have hypernatremia and admitted to the hospital.  Hospital course complicated by persistent agitation, poor oral intake.  IV fluid was discontinued and patient did not take enough p.o. to maintain adequate nutrition and hydration.  Urine output diminished.  Patient's wife met with palliative care medicine.  Ultimately due to lack of improvement and stabilization, continued decline, advanced dementia with hallucinations, wife elected to transition patient to comfort care and accepted residential hospice.  Discharge Diagnoses:   Principal Problem:   Hypernatremia Active Problems:   Depression, psychotic ([place])   Alzheimer's dementia with behavioral disturbance (HCC)   Hypothyroidism   Altered mental status   LFT elevation   Iron deficiency anemia   Folate deficiency   Protein-calorie malnutrition, severe ([place])   DNR (do not resuscitate)   Comfort measures only status   End of life care   Discharge Instructions   Allergies as of 04/14/2022   No Known Allergies      Medication List     STOP taking these medications    acetaminophen 500 MG tablet Commonly known as: TYLENOL   Brexpiprazole 3 MG  Tabs Commonly known as: Rexulti   carboxymethylcellulose 0.5 % Soln Commonly known as: REFRESH PLUS   cholecalciferol 25 MCG (1000 UNIT) tablet Commonly known as: VITAMIN D3   divalproex 125 MG capsule Commonly known as: Depakote Sprinkles   donepezil 10 MG tablet Commonly known as: ARICEPT   famotidine 10 MG tablet Commonly known as: PEPCID   HM Lidocaine Patch 4 % Generic drug: lidocaine   ipratropium 0.06 % nasal spray Commonly known as: ATROVENT   levothyroxine 75 MCG tablet Commonly known as: SYNTHROID   LORazepam 0.5 MG tablet Commonly known as: ATIVAN   memantine 10 MG tablet Commonly known as: NAMENDA   mirtazapine 15 MG disintegrating tablet Commonly known as: REMERON SOL-TAB   OLANZapine 2.5 MG tablet Commonly known as: ZyPREXA   polyethylene glycol powder 17 GM/SCOOP powder Commonly known as: GLYCOLAX/MIRALAX   primidone 50 MG tablet Commonly known as: MYSOLINE   rosuvastatin 20 MG tablet Commonly known as: CRESTOR   sodium fluoride 1.1 % Crea dental cream Commonly known as: PreviDent 5000 Plus   terazosin 2 MG capsule Commonly known as: HYTRIN   Voltaren Arthritis Pain 1 % Gel Generic drug: diclofenac Sodium   vortioxetine HBr 20 MG Tabs tablet Commonly known as: Trintellix   zinc oxide 20 % ointment   ZyPREXA injection Generic drug: OLANZapine        No Known Allergies    Procedures/Studies: US Abdomen Limited RUQ (LIVER/GB)  Result Date: 04/10/2022 CLINICAL DATA:  Elevated liver function test. EXAM: ULTRASOUND ABDOMEN LIMITED RIGHT UPPER QUADRANT COMPARISON:  None Available. FINDINGS: Gallbladder: Multiple gallstones. Largest  1.2 cm. Dependent sludge. No gallbladder wall thickening or pericholecystic fluid. No sonographic Murphy's sign. Common bile duct: Diameter: 1 mm. Liver: No focal lesion identified. Within normal limits in parenchymal echogenicity. Portal vein is patent on color Doppler imaging with normal direction of  blood flow towards the liver. Other: None. IMPRESSION: 1. No acute findings. 2. Cholelithiasis without evidence of acute cholecystitis. 3. Normal sonographic appearance of the liver. Electronically Signed   By: Lajean Manes M.D.   On: 04/10/2022 09:35   CT Head Wo Contrast  Result Date: 04/06/2022 CLINICAL DATA:  Mental status change. EXAM: CT HEAD WITHOUT CONTRAST TECHNIQUE: Contiguous axial images were obtained from the base of the skull through the vertex without intravenous contrast. RADIATION DOSE REDUCTION: This exam was performed according to the departmental dose-optimization program which includes automated exposure control, adjustment of the mA and/or kV according to patient size and/or use of iterative reconstruction technique. COMPARISON:  CT head 11/17/2021 FINDINGS: Brain: Generalized atrophy. 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 CSF collection lateral to the left cerebellum unchanged with mass-effect on the cerebellum. 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 This does not appear to be causing obstructive hydrocephalus. Negative for acute infarct, hemorrhage, mass Vascular: Negative for hyperdense vessel Skull: Negative Sinuses/Orbits: Paranasal sinuses clear.  Negative orbit Other: None IMPRESSION: Progressive enlargement of the ventricles since the CT of 11/17/2021 suggesting hydrocephalus. 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 Generalized atrophy.  No acute infarct or hemorrhage. Electronically Signed   By: Franchot Gallo M.D.   On: 04/06/2022 18:15   DG Chest Portable 1 View  Result Date: 04/06/2022 CLINICAL DATA:  Weakness. EXAM: PORTABLE CHEST 1 VIEW COMPARISON:  October 10, 2018. FINDINGS: The heart size and mediastinal contours are within normal limits. Both lungs are clear. The visualized skeletal structures are unremarkable. IMPRESSION: No active disease. Electronically Signed   By: Marijo Conception M.D.    On: 04/06/2022 18:15      Discharge Exam: Vitals:   04/14/22 0118 04/14/22 0351  BP: 112/80 101/74  Pulse: 88 97  Resp: 18 17  Temp: 98.5 F (36.9 C) 98.9 F (37.2 C)  SpO2: 98% 100%     General: Pt is in no distress, not alert, moaning and shouting    The results of significant diagnostics from this hospitalization (including imaging, microbiology, ancillary and laboratory) are listed below for reference.     Microbiology: Recent Results (from the past 240 hour(s))  Urine Culture     Status: Abnormal   Collection Time: 04/06/22  8:20 PM   Specimen: Urine, Clean Catch  Result Value Ref Range Status   Specimen Description   Final    URINE, CLEAN CATCH Performed at [facility], [place] [address], [place], [place] [address]    Special Requests   Final    NONE Performed at [facility] [place] [address], [place], [place] [address]    Culture (A)  Final    >=100,000 COLONIES/mL DIPHTHEROIDS(CORYNEBACTERIUM SPECIES) Standardized susceptibility testing for this organism is not available. Performed at [facility], [place] [address], [place], [place] [address]    Report Status 04/08/2022 FINAL  Final     Labs: BNP (last 3 results) No results for input(s): "BNP" in the last 8760 hours. Basic Metabolic Panel: Recent Labs  Lab 04/09/22 0500 04/10/22 0344 04/10/22 0511 04/11/22 0403 04/12/22 0450 04/13/22 0505  NA 140  --  142 144 147* 148*  K  2.9*  --  3.9 3.7 4.0 3.7  CL 110  --  113* 114* 117* 116*  CO2 24  --  '25 24 24 26  ' GLUCOSE 135*  --  95 91 89 96  BUN 29*  --  17 21 26* 26*  CREATININE 0.68  --  0.61 0.52* 0.61 0.61  CALCIUM 7.9*  --  8.4* 8.3* 8.4* 8.4*  MG 2.1 2.2  --  2.4 2.5*  --    Liver Function Tests: Recent Labs  Lab 04/10/22 0511  AST 236*  ALT 190*  ALKPHOS 61  BILITOT 0.7  PROT 5.1*  ALBUMIN 2.6*   No results for input(s): "LIPASE", "AMYLASE" in the last 168 hours. Recent Labs  Lab  04/10/22 0957  AMMONIA 14   CBC: Recent Labs  Lab 04/09/22 0500 04/10/22 0344 04/11/22 0403  WBC 3.8* 3.6* 4.2  HGB 10.5* 11.3* 12.8*  HCT 32.0* 32.3* 37.5*  MCV 99.7 97.0 96.9  PLT 130* PLATELET CLUMPS NOTED ON SMEAR, UNABLE TO ESTIMATE 146*   Cardiac Enzymes: Recent Labs  Lab 04/08/22 1205  CKTOTAL 305   BNP: Invalid input(s): "POCBNP" CBG: Recent Labs  Lab 04/12/22 1658 04/13/22 0048 04/13/22 0639 04/13/22 1123 04/13/22 1729  GLUCAP 118* 124* 103* 116* 90   D-Dimer No results for input(s): "DDIMER" in the last 72 hours. Hgb A1c No results for input(s): "HGBA1C" in the last 72 hours. Lipid Profile No results for input(s): "CHOL", "HDL", "LDLCALC", "TRIG", "CHOLHDL", "LDLDIRECT" in the last 72 hours. Thyroid function studies No results for input(s): "TSH", "T4TOTAL", "T3FREE", "THYROIDAB" in the last 72 hours.  Invalid input(s): "FREET3" Anemia work up No results for input(s): "VITAMINB12", "FOLATE", "FERRITIN", "TIBC", "IRON", "RETICCTPCT" in the last 72 hours. Urinalysis    Component Value Date/Time   COLORURINE AMBER (A) 04/06/2022 2020   APPEARANCEUR CLOUDY (A) 04/06/2022 2020   LABSPEC 1.031 (H) 04/06/2022 2020   LABSPEC 1.020 10/10/2018 1237   PHURINE 5.0 04/06/2022 2020   GLUCOSEU NEGATIVE 04/06/2022 2020   HGBUR NEGATIVE 04/06/2022 2020   BILIRUBINUR NEGATIVE 04/06/2022 2020   BILIRUBINUR negative 10/10/2018 1237   BILIRUBINUR n 07/25/2012 1359   KETONESUR 5 (A) 04/06/2022 2020   PROTEINUR 30 (A) 04/06/2022 2020   UROBILINOGEN negative 07/25/2012 1359   UROBILINOGEN 0.2 01/30/2007 0955   NITRITE NEGATIVE 04/06/2022 2020   LEUKOCYTESUR MODERATE (A) 04/06/2022 2020   Sepsis Labs Recent Labs  Lab 04/09/22 0500 04/10/22 0344 04/11/22 0403  WBC 3.8* 3.6* 4.2   Microbiology Recent Results (from the past 240 hour(s))  Urine Culture     Status: Abnormal   Collection Time: 04/06/22  8:20 PM   Specimen: Urine, Clean Catch  Result Value Ref  Range Status   Specimen Description   Final    URINE, CLEAN CATCH Performed at [facility], [place] [address] [place], [place] [address]    Special Requests   Final    NONE Performed at [facility], [place] [address] [place], [place] [address]    Culture (A)  Final    >=100,000 COLONIES/mL DIPHTHEROIDS(CORYNEBACTERIUM SPECIES) Standardized susceptibility testing for this organism is not available. Performed at [facility], [place] [address], [place], [place] [address]    Report Status 04/08/2022 FINAL  Final     Patient was seen and examined on the day of discharge and was found to be in stable condition. Time coordinating discharge: 20 minutes including assessment and coordination of care, as well as examination of  the patient.   SIGNED:  Dessa Phi, DO [facility] 04/14/2022, 10:52 AM

## 2022-04-14 NOTE — Progress Notes (Signed)
[facility] [address] [employer] [facility]) Hospice hospital liaison note   Referral received for EOL care at [facility]. Consents are complete and patient is appropriate for transfer over to [facility] at any time.    RN staff, you may call report at any time to [phone], room is assigned when report is called.   Please send completed DNR with patient.   Updated attending and [facility] manager via [place].  Thank you for the opportunity to participate in this patient's care Jhonnie Garner, BSN, RN Hospice nurse liaison [phone]

## 2022-04-14 NOTE — TOC Transition Note (Addendum)
Transition of Care [facility]) - CM/SW Discharge Note   Patient Details  Name: [name] MRN: [id number] Date of Birth: Aug 13, 1945  Transition of [facility]) CM/SW Contact:  [name], RN Phone Number: 04/14/2022, 11:44 AM   Clinical Narrative:  pt's wife [name] in agreement to pt transport to [facility]; awaiting when pt is ready when nurse has given report; report can be called to [phone] prior PTAR; no TOC needs. -1354- PTAR notified for pickup; nursing aware; no further CM needs.       Barriers to Discharge: Continued Medical Work up   Patient Goals and CMS Choice Patient states their goals for this hospitalization and ongoing recovery are:: Residential hospice CMS Medicare.gov Compare Post Acute Care list provided to:: Patient Represenative (must comment) ([name] ( wife)) Choice offered to / list presented to : Spouse  Discharge Placement                       Discharge Plan and Services In-house Referral: NA Discharge Planning Services: CM Consult Post Acute Care Choice: Skilled Nursing Facility          DME Arranged: N/A DME Agency: NA       HH Arranged: NA HH Agency: NA        Social Determinants of Health (SDOH) Interventions     Readmission Risk Interventions     No data to display

## 2022-04-14 NOTE — Progress Notes (Signed)
Palliative Care Progress Note, Assessment & Plan   Patient Name: [name]       Date: 04/14/2022 DOB: [birth year]  Age: 76 y.o. MRN#: [id number] Attending Physician: Dessa Phi, DO Primary Care Physician: Virgie Dad, MD Admit Date: 04/06/2022  Reason for Consultation/Follow-up: Establishing goals of care  Subjective: Patient is lying in bed in no apparent distress. RN has just given PRN dose of Ativan and patient is resting. Respirations are even and unlabored. Patient does not acknowledge my presence.  Wife is at bedside.  HPI: Patient is a 76 year old male with a past medical history of Alzheimer's/dementia that was admitted to the emergency department on 8/31 due to increasing agitation and poor p.o. intake. Patient was recently hospitalized at [facility] (approximately 2 weeks ago) where patient's medications were adjusted and he was discharged to SNF.  Over the course of the last month at SNF, patient had progressive agitation and over the last few days PTA had decreased p.o. intake.   On admission, patient was hypernatremic.  Course of hospitalization has been complicated by poor po intake, persistent agitation and hallucinations.   Psychiatry and palliative medicine were consulted.  PMT was consulted to discuss goals of care.  Summary of counseling/coordination of care: After reviewing the patient's chart and assessing the patient at bedside, I met with patient's wife [name].  She has just met with hospice liaison.  She continues to be in agreement for patient to be evaluated for inpatient hospice placement.  Therapeutic silence, active listening, and emotional support provided.  [name] is tearful.  She asked if patient's medications that are helping control his hallucinations and agitation  here will be continued at the hospice home.  I assured her our recommendations would go with the patient to adequately manage his symptoms.  PMT will continue to follow the patient throughout his hospitalization and provide support to both patient and wife.  Code Status: DNR  Prognosis: < 2 weeks  Discharge Planning: Hospice facility  Physical Exam Vitals reviewed.  Constitutional:      General: He is not in acute distress.    Appearance: He is not ill-appearing.  HENT:     Head: Normocephalic.     Mouth/Throat:     Mouth: Mucous membranes are moist.  Cardiovascular:     Rate and Rhythm: Normal rate.     Pulses: Normal pulses.  Pulmonary:     Effort: Pulmonary effort is normal.  Musculoskeletal:     Comments: Bilateral LE contractures  Skin:    General: Skin is warm and dry.  Neurological:     Comments: Nonverbal             Palliative Assessment/Data: 20%    Total Time 25 minutes  Greater than 50%  of this time was spent counseling and coordinating care related to the above assessment and plan.  Thank you for allowing the Palliative Medicine Team to assist in the care of this patient.  Weld Ilsa Iha, FNP-BC Palliative Medicine Team Team Phone # [phone]

## 2022-04-24 ENCOUNTER — Telehealth: Payer: Self-pay

## 2022-04-24 ENCOUNTER — Telehealth: Payer: Self-pay | Admitting: Physician Assistant

## 2022-04-24 NOTE — Telephone Encounter (Signed)
Pt. Wife called to let us know that he passed away on 05/13/2022.

## 2022-04-24 NOTE — Telephone Encounter (Signed)
Pt's wife called in to cancel the pt's January appointment due to the patient passing away 2022/04/20.

## 2022-04-25 NOTE — Telephone Encounter (Signed)
Sympathy card sent 

## 2022-05-07 DEATH — deceased

## 2022-05-10 ENCOUNTER — Inpatient Hospital Stay: Payer: Medicare Other | Admitting: Nurse Practitioner

## 2022-05-12 ENCOUNTER — Other Ambulatory Visit: Payer: Self-pay | Admitting: Family Medicine

## 2022-08-14 ENCOUNTER — Ambulatory Visit: Payer: Medicare Other | Admitting: Physician Assistant

## 2022-11-29 ENCOUNTER — Ambulatory Visit: Payer: Medicare Other | Admitting: Family Medicine
